# Patient Record
Sex: Male | Born: 1935 | Race: White | Hispanic: No | Marital: Married | Smoking: Former smoker
Health system: Southern US, Community
[De-identification: ages and names within clinical notes are randomized; demographics above are authoritative.]

## PROBLEM LIST (undated history)

## (undated) DIAGNOSIS — I499 Cardiac arrhythmia, unspecified: Secondary | ICD-10-CM

## (undated) DIAGNOSIS — C4491 Basal cell carcinoma of skin, unspecified: Secondary | ICD-10-CM

## (undated) DIAGNOSIS — I209 Angina pectoris, unspecified: Secondary | ICD-10-CM

## (undated) DIAGNOSIS — K469 Unspecified abdominal hernia without obstruction or gangrene: Secondary | ICD-10-CM

## (undated) DIAGNOSIS — C439 Malignant melanoma of skin, unspecified: Secondary | ICD-10-CM

## (undated) DIAGNOSIS — Z87442 Personal history of urinary calculi: Secondary | ICD-10-CM

## (undated) DIAGNOSIS — Z8719 Personal history of other diseases of the digestive system: Secondary | ICD-10-CM

## (undated) DIAGNOSIS — E119 Type 2 diabetes mellitus without complications: Secondary | ICD-10-CM

## (undated) DIAGNOSIS — M1990 Unspecified osteoarthritis, unspecified site: Secondary | ICD-10-CM

## (undated) DIAGNOSIS — R112 Nausea with vomiting, unspecified: Secondary | ICD-10-CM

## (undated) DIAGNOSIS — E079 Disorder of thyroid, unspecified: Secondary | ICD-10-CM

## (undated) DIAGNOSIS — R053 Chronic cough: Secondary | ICD-10-CM

## (undated) DIAGNOSIS — E785 Hyperlipidemia, unspecified: Secondary | ICD-10-CM

## (undated) DIAGNOSIS — K219 Gastro-esophageal reflux disease without esophagitis: Secondary | ICD-10-CM

## (undated) DIAGNOSIS — Z9889 Other specified postprocedural states: Secondary | ICD-10-CM

## (undated) DIAGNOSIS — J310 Chronic rhinitis: Secondary | ICD-10-CM

## (undated) DIAGNOSIS — D649 Anemia, unspecified: Secondary | ICD-10-CM

## (undated) DIAGNOSIS — R42 Dizziness and giddiness: Secondary | ICD-10-CM

## (undated) DIAGNOSIS — I2510 Atherosclerotic heart disease of native coronary artery without angina pectoris: Secondary | ICD-10-CM

## (undated) DIAGNOSIS — R05 Cough: Secondary | ICD-10-CM

## (undated) DIAGNOSIS — F419 Anxiety disorder, unspecified: Secondary | ICD-10-CM

## (undated) DIAGNOSIS — K5730 Diverticulosis of large intestine without perforation or abscess without bleeding: Secondary | ICD-10-CM

## (undated) DIAGNOSIS — R200 Anesthesia of skin: Secondary | ICD-10-CM

## (undated) DIAGNOSIS — Z8739 Personal history of other diseases of the musculoskeletal system and connective tissue: Secondary | ICD-10-CM

## (undated) DIAGNOSIS — A770 Spotted fever due to Rickettsia rickettsii: Secondary | ICD-10-CM

## (undated) DIAGNOSIS — H409 Unspecified glaucoma: Secondary | ICD-10-CM

## (undated) DIAGNOSIS — I10 Essential (primary) hypertension: Secondary | ICD-10-CM

## (undated) DIAGNOSIS — G479 Sleep disorder, unspecified: Secondary | ICD-10-CM

## (undated) DIAGNOSIS — T8859XA Other complications of anesthesia, initial encounter: Secondary | ICD-10-CM

## (undated) DIAGNOSIS — T4145XA Adverse effect of unspecified anesthetic, initial encounter: Secondary | ICD-10-CM

## (undated) HISTORY — DX: Unspecified glaucoma: H40.9

## (undated) HISTORY — PX: OTHER SURGICAL HISTORY: SHX169

## (undated) HISTORY — DX: Malignant melanoma of skin, unspecified: C43.9

## (undated) HISTORY — DX: Type 2 diabetes mellitus without complications: E11.9

## (undated) HISTORY — DX: Atherosclerotic heart disease of native coronary artery without angina pectoris: I25.10

## (undated) HISTORY — PX: TONSILLECTOMY: SUR1361

## (undated) HISTORY — DX: Essential (primary) hypertension: I10

## (undated) HISTORY — DX: Chronic rhinitis: J31.0

## (undated) HISTORY — DX: Hyperlipidemia, unspecified: E78.5

## (undated) HISTORY — DX: Gastro-esophageal reflux disease without esophagitis: K21.9

---

## 1969-08-20 HISTORY — PX: HERNIA REPAIR: SHX51

## 1989-08-06 HISTORY — PX: CORONARY ARTERY BYPASS GRAFT: SHX141

## 2000-03-23 ENCOUNTER — Emergency Department (HOSPITAL_COMMUNITY): Admission: EM | Admit: 2000-03-23 | Discharge: 2000-03-23 | Payer: Self-pay | Admitting: Emergency Medicine

## 2000-03-23 ENCOUNTER — Encounter: Payer: Self-pay | Admitting: Emergency Medicine

## 2004-10-27 ENCOUNTER — Encounter: Admission: RE | Admit: 2004-10-27 | Discharge: 2004-10-27 | Payer: Self-pay | Admitting: Internal Medicine

## 2010-05-04 ENCOUNTER — Inpatient Hospital Stay (HOSPITAL_COMMUNITY): Admission: EM | Admit: 2010-05-04 | Discharge: 2010-05-08 | Payer: Self-pay | Admitting: Emergency Medicine

## 2010-09-24 ENCOUNTER — Observation Stay (HOSPITAL_COMMUNITY): Admission: EM | Admit: 2010-09-24 | Discharge: 2010-09-25 | Payer: Self-pay | Admitting: Emergency Medicine

## 2011-02-17 LAB — BASIC METABOLIC PANEL
BUN: 15 mg/dL (ref 6–23)
Calcium: 9.5 mg/dL (ref 8.4–10.5)
Chloride: 103 mEq/L (ref 96–112)
Creatinine, Ser: 0.74 mg/dL (ref 0.4–1.5)
GFR calc Af Amer: >60 mL/min (ref >60)
GFR calc non Af Amer: >60 mL/min (ref >60)
Glucose, Bld: 127 mg/dL — ABNORMAL HIGH (ref 70–99)
Sodium: 138 mEq/L (ref 135–145)

## 2011-02-17 LAB — PROTIME-INR
INR: 0.99 (ref 0.00–1.49)
Prothrombin Time: 13.3 seconds (ref 11.6–15.2)
Prothrombin Time: 13.4 seconds (ref 11.6–15.2)

## 2011-02-17 LAB — CBC
Hemoglobin: 12.2 g/dL — ABNORMAL LOW (ref 13.0–17.0)
MCH: 31.9 pg (ref 26.0–34.0)
MCHC: 33.7 g/dL (ref 30.0–36.0)
MCV: 93 fL (ref 78.0–100.0)
Platelets: 218 10*3/uL (ref 150–400)
Platelets: 253 10*3/uL (ref 150–400)
RBC: 3.83 MIL/uL — ABNORMAL LOW (ref 4.22–5.81)
RDW: 12.8 % (ref 11.5–15.5)
WBC: 8 10*3/uL (ref 4.0–10.5)

## 2011-02-17 LAB — DIFFERENTIAL
Basophils Absolute: 0 10*3/uL (ref 0.0–0.1)
Eosinophils Absolute: 0.2 10*3/uL (ref 0.0–0.7)
Eosinophils Relative: 2 % (ref 0–5)
Monocytes Relative: 7 % (ref 3–12)
Neutrophils Relative %: 67 % (ref 43–77)

## 2011-02-17 LAB — GLUCOSE, CAPILLARY
Glucose-Capillary: 124 mg/dL — ABNORMAL HIGH (ref 70–99)
Glucose-Capillary: 134 mg/dL — ABNORMAL HIGH (ref 70–99)

## 2011-02-22 LAB — CBC
HCT: 30.1 % — ABNORMAL LOW (ref 39.0–52.0)
HCT: 32.8 % — ABNORMAL LOW (ref 39.0–52.0)
HCT: 34.9 % — ABNORMAL LOW (ref 39.0–52.0)
HCT: 38.3 % — ABNORMAL LOW (ref 39.0–52.0)
Hemoglobin: 12.1 g/dL — ABNORMAL LOW (ref 13.0–17.0)
MCHC: 34.4 g/dL (ref 30.0–36.0)
MCHC: 34.6 g/dL (ref 30.0–36.0)
MCV: 92.7 fL (ref 78.0–100.0)
MCV: 93 fL (ref 78.0–100.0)
MCV: 93.2 fL (ref 78.0–100.0)
Platelets: 149 10*3/uL — ABNORMAL LOW (ref 150–400)
Platelets: 172 10*3/uL (ref 150–400)
Platelets: 166 10*3/uL (ref 150–400)
RBC: 3.76 MIL/uL — ABNORMAL LOW (ref 4.22–5.81)
RDW: 13.8 % (ref 11.5–15.5)
RDW: 14 % (ref 11.5–15.5)
RDW: 13.3 % (ref 11.5–15.5)
WBC: 10.8 10*3/uL — ABNORMAL HIGH (ref 4.0–10.5)
WBC: 7.5 10*3/uL (ref 4.0–10.5)
WBC: 8.8 10*3/uL (ref 4.0–10.5)
WBC: 9.9 10*3/uL (ref 4.0–10.5)

## 2011-02-22 LAB — DIFFERENTIAL
Basophils Absolute: 0 10*3/uL (ref 0.0–0.1)
Basophils Relative: 0 % (ref 0–1)
Eosinophils Absolute: 0 10*3/uL (ref 0.0–0.7)
Eosinophils Relative: 0 % (ref 0–5)
Lymphs Abs: 0.6 10*3/uL — ABNORMAL LOW (ref 0.7–4.0)
Neutrophils Relative %: 86 % — ABNORMAL HIGH (ref 43–77)

## 2011-02-22 LAB — STOOL CULTURE

## 2011-02-22 LAB — COMPREHENSIVE METABOLIC PANEL
AST: 45 U/L — ABNORMAL HIGH (ref 0–37)
BUN: 18 mg/dL (ref 6–23)
CO2: 25 mEq/L (ref 19–32)
Chloride: 92 mEq/L — ABNORMAL LOW (ref 96–112)
Creatinine, Ser: 0.84 mg/dL (ref 0.4–1.5)
GFR calc non Af Amer: >60 mL/min (ref >60)
Glucose, Bld: 128 mg/dL — ABNORMAL HIGH (ref 70–99)
Total Bilirubin: 0.6 mg/dL (ref 0.3–1.2)

## 2011-02-22 LAB — POCT I-STAT, CHEM 8
BUN: 18 mg/dL (ref 6–23)
Calcium, Ion: 1.06 mmol/L — ABNORMAL LOW (ref 1.12–1.32)
HCT: 40 % (ref 39.0–52.0)
Hemoglobin: 13.6 g/dL (ref 13.0–17.0)
Sodium: 126 mEq/L — ABNORMAL LOW (ref 135–145)
TCO2: 26 mmol/L (ref 0–100)

## 2011-02-22 LAB — GLUCOSE, CAPILLARY
Glucose-Capillary: 108 mg/dL — ABNORMAL HIGH (ref 70–99)
Glucose-Capillary: 112 mg/dL — ABNORMAL HIGH (ref 70–99)
Glucose-Capillary: 123 mg/dL — ABNORMAL HIGH (ref 70–99)
Glucose-Capillary: 125 mg/dL — ABNORMAL HIGH (ref 70–99)
Glucose-Capillary: 130 mg/dL — ABNORMAL HIGH (ref 70–99)
Glucose-Capillary: 150 mg/dL — ABNORMAL HIGH (ref 70–99)
Glucose-Capillary: 116 mg/dL — ABNORMAL HIGH (ref 70–99)
Glucose-Capillary: 142 mg/dL — ABNORMAL HIGH (ref 70–99)
Glucose-Capillary: 157 mg/dL — ABNORMAL HIGH (ref 70–99)
Glucose-Capillary: 165 mg/dL — ABNORMAL HIGH (ref 70–99)
Glucose-Capillary: 183 mg/dL — ABNORMAL HIGH (ref 70–99)

## 2011-02-22 LAB — BASIC METABOLIC PANEL
BUN: 10 mg/dL (ref 6–23)
Calcium: 7.8 mg/dL — ABNORMAL LOW (ref 8.4–10.5)
Chloride: 90 mEq/L — ABNORMAL LOW (ref 96–112)
Chloride: 98 mEq/L (ref 96–112)
Chloride: 99 mEq/L (ref 96–112)
Creatinine, Ser: 0.67 mg/dL (ref 0.4–1.5)
Creatinine, Ser: 0.83 mg/dL (ref 0.4–1.5)
GFR calc Af Amer: >60 mL/min (ref >60)
GFR calc non Af Amer: >60 mL/min (ref >60)
Glucose, Bld: 120 mg/dL — ABNORMAL HIGH (ref 70–99)
Potassium: 3.7 mEq/L (ref 3.5–5.1)
Potassium: 4.7 mEq/L (ref 3.5–5.1)
Sodium: 129 mEq/L — ABNORMAL LOW (ref 135–145)

## 2011-02-22 LAB — CLOSTRIDIUM DIFFICILE EIA

## 2011-02-22 LAB — URINALYSIS, ROUTINE W REFLEX MICROSCOPIC
Glucose, UA: NEGATIVE mg/dL
Ketones, ur: >80 mg/dL — AB
Leukocytes, UA: NEGATIVE
Protein, ur: 30 mg/dL — AB
pH: 5.5 (ref 5.0–8.0)

## 2011-02-22 LAB — URINE MICROSCOPIC-ADD ON

## 2011-02-22 LAB — CULTURE, BLOOD (ROUTINE X 2)

## 2011-02-22 LAB — GIARDIA/CRYPTOSPORIDIUM SCREEN(EIA)
Cryptosporidium Screen (EIA): NEGATIVE
Giardia Screen - EIA: NEGATIVE

## 2011-02-22 LAB — POCT CARDIAC MARKERS: Myoglobin, poc: 245 ng/mL (ref 12–200)

## 2011-02-22 LAB — URINE CULTURE: Colony Count: NO GROWTH

## 2011-02-22 LAB — ROCKY MTN SPOTTED FVR AB, IGG-BLOOD: RMSF IgG: 0.08 IV

## 2011-04-26 ENCOUNTER — Inpatient Hospital Stay (INDEPENDENT_AMBULATORY_CARE_PROVIDER_SITE_OTHER)
Admission: RE | Admit: 2011-04-26 | Discharge: 2011-04-26 | Disposition: A | Discharge status: Home / Self Care | Payer: Self-pay | Source: Ambulatory Visit | Attending: Family Medicine | Admitting: Family Medicine

## 2011-04-26 ENCOUNTER — Ambulatory Visit (INDEPENDENT_AMBULATORY_CARE_PROVIDER_SITE_OTHER): Payer: Self-pay

## 2011-04-26 DIAGNOSIS — S139XXA Sprain of joints and ligaments of unspecified parts of neck, initial encounter: Secondary | ICD-10-CM

## 2011-04-26 DIAGNOSIS — G542 Cervical root disorders, not elsewhere classified: Secondary | ICD-10-CM

## 2011-06-03 ENCOUNTER — Inpatient Hospital Stay (HOSPITAL_BASED_OUTPATIENT_CLINIC_OR_DEPARTMENT_OTHER)
Admission: RE | Admit: 2011-06-03 | Discharge: 2011-06-03 | Disposition: A | Discharge status: Hospice | Payer: Medicare Other | Source: Ambulatory Visit | Attending: Interventional Cardiology | Admitting: Interventional Cardiology

## 2011-06-03 DIAGNOSIS — R0789 Other chest pain: Secondary | ICD-10-CM | POA: Insufficient documentation

## 2011-06-03 DIAGNOSIS — I2582 Chronic total occlusion of coronary artery: Secondary | ICD-10-CM | POA: Insufficient documentation

## 2011-06-03 DIAGNOSIS — R5381 Other malaise: Secondary | ICD-10-CM | POA: Insufficient documentation

## 2011-06-03 DIAGNOSIS — I2510 Atherosclerotic heart disease of native coronary artery without angina pectoris: Secondary | ICD-10-CM | POA: Insufficient documentation

## 2011-06-03 DIAGNOSIS — R0609 Other forms of dyspnea: Secondary | ICD-10-CM | POA: Insufficient documentation

## 2011-06-03 DIAGNOSIS — Z951 Presence of aortocoronary bypass graft: Secondary | ICD-10-CM | POA: Insufficient documentation

## 2011-06-03 DIAGNOSIS — R0989 Other specified symptoms and signs involving the circulatory and respiratory systems: Secondary | ICD-10-CM | POA: Insufficient documentation

## 2011-06-10 NOTE — Cardiovascular Report (Signed)
NAME:  Kenneth [name], Kenneth [name] NO.:  [id number]  MEDICAL RECORD NO.:  [id number]  LOCATION:  URG                          FACILITY:  [facility]  PHYSICIAN:  Lyn Records, M.D.   DATE OF BIRTH:  06-Jan-1928  DATE OF PROCEDURE:  06/03/2011 DATE OF DISCHARGE:  04/26/2011                           CARDIAC CATHETERIZATION   INDICATION FOR THE STUDY:  The patient gives a 2-6 months history of increasing exertional fatigue and dyspnea.  There is also associated chest tightness.  He has a history of coronary artery bypass grafting in 1993.  He is undergoing coronary angiography after a stress nuclear study demonstrated high risk characteristics including ischemic LV dilatation, and basal to mid anterior wall moderate-to-severe ischemia. He has preserved LVEF.  PROCEDURE PERFORMED: 1. Left heart catheterization via the right femoral approach using 4-     [place] equipment. 2. Selective native coronary angiography. 3. Saphenous vein graft angiography. 4. Left internal mammary graft angiography. 5. Left ventriculography by hand injection.  DESCRIPTION:  After informed consent was documented in the patient's records, left heart catheterization was performed via the right femoral approach using a modified Seldinger technique to insert a 4-French sheath.  Xylocaine 1% was used for local anesthesia.  A 4-French A2 multipurpose catheter was used for saphenous vein graft angiography, as well as native right coronary angiography, and left ventriculography by hand injection.  Hemodynamic data was recorded with the catheter.  We used a 4-French #4 left Judkins catheter for left coronary angiography.  This catheter was also used to administer 200 mcg of intracoronary nitroglycerin into the left main.  The native right coronary artery was engaged using the multipurpose catheter.  A 4-French #4 internal mammary catheter was used for left internal mammary artery angiography.  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  We required a steerable 0.035 Glidewire to enter the subclavian.  After that manipulation, we easily engaged the left internal mammary.  The patient tolerated the procedure without complications.  Hemostasis was achieved with manual compression.  RESULTS: 1. Hemodynamic data:     a.     Aortic pressure 183/65 mmHg.     b.     Left ventricular pressure 185/26 mmHg.         I. Left ventriculography:  Distal inferoapical mild to             moderate hypokinesis with preserved overall LV function and             estimated ejection fraction of 60%.         II.Coronary angiography:  Heavy diffuse coronary calcification             is noted above the left and right coronary with             fluoroscopy.     c.     Left main coronary:  Totally occluded before the origin of      the circumflex and left anterior descending.     d.     Left anterior descending coronary:  Totally  occluded.     e.     Circumflex artery:  Totally occluded.     f.     Right coronary:  Proximal occlusion after origin of the      conus branch.         I. Bypass graft angiography.     g.     Saphenous vein graft to the obtuse marginal:  There is      proximal 30% narrowing in the saphenous vein graft.  The graft      then anastomoses to the mid dominant obtuse marginal and is widely      patent.  The native obtuse marginal contains heavy calcification      but no high-grade obstruction.  Collaterals to either ramus      intermedius or diagonal branch is noted.     h.     Saphenous vein graft to the right coronary:  The right      coronary is a dominant vessel.         I. Left ventricular branches arise distally and supplies             collaterals to the anterolateral wall and perhaps some             obtuse marginals. A Large PDA arises distally.  There  is             moderate disease in the native right coronary proximal to             the graft insertion site into the PDA.  These regions are             heavily calcified and they were present above the calcific             deposits within the distal right coronary.  They are             borderline significant.  The PDA supplies collaterals to             the proximal and mid anterior septum via perforators.  The             LAD distal to the anastomoses is diffusely diseased,             heavily calcified, and had stenoses up to 60-70%.  No focal             high-grade stenosis was noted.  CONCLUSIONS: 1. Severe native vessel coronary artery disease with total occlusion     of the distal left main and the proximal RCA. 2. Patent saphenous vein and left internal mammary artery graft as     noted above. 3. Abnormal nuclear study with basal and mid anterior wall ischemia     due to occlusion of the ostial LAD preventing antegrade flow and     occlusion of the mid-LAD at the anastomoses with the internal     mammary leaving the proximal and mid anterior wall depending upon     poorly formed collaterals from the right coronary territory and the     obtuse marginal territory.  The patient also has diffuse disease in     the LAD distal to the LIMA graft insertion, however, there is no     evidence of ischemia in the distal and apical LAD territory. 4. Preserved overall LV function with inferior wall motion     abnormalities.  PLAN: 1. Medication adjustments will include addition of long-acting  nitrates to hopefully help improve collateral blood flow to the     proximal and mid anterior wall.  Aggressive systolic blood pressure     control.  There is no reasonable treatment option with reference to     percutaneous angioplasty given the patient's anatomy.  Despite the     high risk appearance of the patient's nuclear study, the anatomy     would suggest a relatively stable clinical  situation, although time     and perhaps medication adjustment may help to further decrease the     patient's risk by improving collateral flow to the anterior wall.     Lyn Records, M.D.     HWS/MEDQ  D:  06/03/2011  T:  06/04/2011  Job:  [id number]  cc:   Thora Lance, M.D.  Electronically Signed by Verdis Prime M.D. on 06/10/2011 01:34:29 PM

## 2013-07-10 ENCOUNTER — Other Ambulatory Visit: Payer: Self-pay | Admitting: Internal Medicine

## 2013-07-10 DIAGNOSIS — I639 Cerebral infarction, unspecified: Secondary | ICD-10-CM

## 2013-07-10 DIAGNOSIS — R42 Dizziness and giddiness: Secondary | ICD-10-CM

## 2013-07-10 DIAGNOSIS — R2689 Other abnormalities of gait and mobility: Secondary | ICD-10-CM

## 2013-07-11 ENCOUNTER — Ambulatory Visit
Admission: RE | Admit: 2013-07-11 | Discharge: 2013-07-11 | Disposition: A | Discharge status: Home / Self Care | Payer: Medicare Other | Source: Ambulatory Visit | Attending: Internal Medicine | Admitting: Internal Medicine

## 2013-07-11 DIAGNOSIS — R2689 Other abnormalities of gait and mobility: Secondary | ICD-10-CM

## 2013-07-11 DIAGNOSIS — R42 Dizziness and giddiness: Secondary | ICD-10-CM

## 2013-07-11 DIAGNOSIS — I639 Cerebral infarction, unspecified: Secondary | ICD-10-CM

## 2013-07-17 ENCOUNTER — Other Ambulatory Visit: Payer: Self-pay | Admitting: Internal Medicine

## 2013-07-17 DIAGNOSIS — R0981 Nasal congestion: Secondary | ICD-10-CM

## 2013-07-18 ENCOUNTER — Ambulatory Visit
Admission: RE | Admit: 2013-07-18 | Discharge: 2013-07-18 | Disposition: A | Discharge status: Home / Self Care | Payer: Medicare Other | Source: Ambulatory Visit | Attending: Internal Medicine | Admitting: Internal Medicine

## 2013-07-18 DIAGNOSIS — R0981 Nasal congestion: Secondary | ICD-10-CM

## 2013-07-18 MED ORDER — IOHEXOL 300 MG/ML  SOLN
75.0000 mL | Freq: Once | INTRAMUSCULAR | Status: AC | PRN
  Administered 2013-07-18: 75 mL via INTRAVENOUS

## 2013-09-12 ENCOUNTER — Ambulatory Visit: Payer: Medicare Other | Admitting: Interventional Cardiology

## 2013-09-27 ENCOUNTER — Encounter: Payer: Self-pay | Admitting: *Deleted

## 2013-09-27 ENCOUNTER — Encounter: Payer: Self-pay | Admitting: Interventional Cardiology

## 2013-09-27 DIAGNOSIS — I10 Essential (primary) hypertension: Secondary | ICD-10-CM | POA: Insufficient documentation

## 2013-09-27 DIAGNOSIS — H409 Unspecified glaucoma: Secondary | ICD-10-CM | POA: Insufficient documentation

## 2013-09-27 DIAGNOSIS — N200 Calculus of kidney: Secondary | ICD-10-CM | POA: Insufficient documentation

## 2013-09-27 DIAGNOSIS — C439 Malignant melanoma of skin, unspecified: Secondary | ICD-10-CM | POA: Insufficient documentation

## 2013-09-27 DIAGNOSIS — E785 Hyperlipidemia, unspecified: Secondary | ICD-10-CM | POA: Insufficient documentation

## 2013-09-27 DIAGNOSIS — I25709 Atherosclerosis of coronary artery bypass graft(s), unspecified, with unspecified angina pectoris: Secondary | ICD-10-CM | POA: Insufficient documentation

## 2013-09-27 DIAGNOSIS — K219 Gastro-esophageal reflux disease without esophagitis: Secondary | ICD-10-CM | POA: Insufficient documentation

## 2013-09-28 ENCOUNTER — Encounter (INDEPENDENT_AMBULATORY_CARE_PROVIDER_SITE_OTHER): Payer: Self-pay

## 2013-09-28 ENCOUNTER — Ambulatory Visit (INDEPENDENT_AMBULATORY_CARE_PROVIDER_SITE_OTHER): Payer: Medicare Other | Admitting: Interventional Cardiology

## 2013-09-28 ENCOUNTER — Encounter: Payer: Self-pay | Admitting: Interventional Cardiology

## 2013-09-28 VITALS — BP 164/90 | HR 72 | Ht 68.0 in | Wt 163.8 lb

## 2013-09-28 DIAGNOSIS — I10 Essential (primary) hypertension: Secondary | ICD-10-CM

## 2013-09-28 DIAGNOSIS — R634 Abnormal weight loss: Secondary | ICD-10-CM

## 2013-09-28 DIAGNOSIS — I2510 Atherosclerotic heart disease of native coronary artery without angina pectoris: Secondary | ICD-10-CM

## 2013-09-28 DIAGNOSIS — E785 Hyperlipidemia, unspecified: Secondary | ICD-10-CM

## 2013-09-28 DIAGNOSIS — R1310 Dysphagia, unspecified: Secondary | ICD-10-CM

## 2013-09-28 NOTE — Progress Notes (Signed)
Patient ID: Kenneth [name], male   DOB: 1928/10/18, 79 y.o.   MRN: [id number]    [address], [address] [place], [place]  [address] Phone: [phone] Fax:  [phone]  Date:  09/28/2013   ID:  Kenneth [name], DOB Oct 14, 1928, MRN [id number]  PCP:  Provider Not In System   ASSESSMENT:  1. CAD stable  2. Hypertension  3. Anorexia and weight loss  4. Early satiety  5.Dysphagia.  PLAN:  1. Needs to see primary care about weight loss/early satiety/dysphagia  2. No specific cardiac evaluation is needed.  3. Broad a barium swallow with upper GI series. We need to exclude the possibility of esophageal or gastric cancer   SUBJECTIVE: Kenneth [name] is a 79 y.o. male who is having left side pain and poor appetite. He has lost weight, 30 pounds since October 2013.[name] No cardiac complaints. He does get angina if he walks 300 yards.. Having dysphagia with solids. He develops early satiety. He has stable angina. He denies orthopnea. There is no peripheral edema.   Wt Readings from Last 3 Encounters:  09/28/13 163 lb 12.8 oz (74.299 kg)     Past Medical History  Diagnosis Date  . Diabetes   . HTN (hypertension)   . Coronary atherosclerosis of unspecified type of vessel, native or graft     CABG 1993, LIMA to LAD, SVG to obtuse marginal one, SVG to posterior descending, stable angina  . Hyperlipidemia `  . GERD (gastroesophageal reflux disease)   . Nephrolithiasis   . Chronic rhinitis   . Melanoma of skin, site unspecified   . Glaucoma     Current Outpatient Prescriptions  Medication Sig Dispense Refill  . acetaminophen (TYLENOL) 325 MG tablet Take 650 mg by mouth every 6 (six) hours as needed for pain.      [name] amLODipine (NORVASC) 10 MG tablet Take 10 mg by mouth daily.      . Cyanocobalamin (VITAMIN B-12 PO) Take 1 tablet by mouth daily.      . ferrous fumarate (HEMOCYTE - 106 MG FE) 325 (106 FE) MG TABS tablet Take 1 tablet by mouth. 65 MG      .  hydrochlorothiazide (HYDRODIURIL) 25 MG tablet Take 12.5 mg by mouth 2 (two) times daily.       . isosorbide mononitrate (IMDUR) 60 MG 24 hr tablet Take 60 mg by mouth daily.      . metFORMIN (GLUCOPHAGE) 1000 MG tablet Take 1,000 mg by mouth as directed.      . metoprolol succinate (TOPROL-XL) 50 MG 24 hr tablet Take 50 mg by mouth daily. Take with or immediately following a meal.      . Multiple Vitamins-Minerals (CENTRAL-VITE PO) Take by mouth daily.      . nitroGLYCERIN (NITROSTAT) 0.4 MG SL tablet Place 0.4 mg under the tongue every 5 (five) minutes as needed for chest pain.      . pantoprazole (PROTONIX) 40 MG tablet Take 40 mg by mouth daily.      . potassium chloride (K-DUR,KLOR-CON) 10 MEQ tablet Take 10 mEq by mouth 2 (two) times daily.      . simvastatin (ZOCOR) 20 MG tablet Take 20 mg by mouth every evening.       No current facility-administered medications for this visit.    Allergies:    Allergies  Allergen Reactions  . Lipitor [Atorvastatin]   . Lopressor [Metoprolol Tartrate]   . Penicillins     Social History:  The patient  reports that he has been smoking Cigars.  He does not have any smokeless tobacco history on file. He reports that he does not drink alcohol or use illicit drugs.   ROS:  Please see the history of present illness.   Appears weak. No orthopnea or PND. Denies melena. Has left lower quadrant discomfort. Also has left flank discomfort.   All other systems reviewed and negative.   OBJECTIVE: VS:  BP 164/90  Pulse 72  Ht 5\' 8"  (1.727 m)  Wt 163 lb 12.8 oz (74.299 kg)  BMI 24.91 kg/m2 Well nourished, well developed, in no acute distress, frail HEENT: normal Neck: JVD flat. Carotid bruit absent  Cardiac:  normal S1, S2; RRR; no murmur Lungs:  clear to auscultation bilaterally, no wheezing, rhonchi or rales Abd: soft, nontender, no hepatomegaly Ext: Edema trace. Pulses present Skin: warm and dryAnd pale Neuro:  CNs 2-12 intact, no focal abnormalities  noted  EKG:  Normal sinus rhythm with prominent voltage. Left atrial abnormality. Incomplete right bundle branch block. No change from baseline       Signed, Darci Needle III, MD 09/28/2013 11:30 AM

## 2013-09-28 NOTE — Patient Instructions (Signed)
Your physician recommends that you continue on your current medications as directed. Please refer to the Current Medication list given to you today.  Your physician wants you to follow-up in: 1 year You will receive a reminder letter in the mail two months in advance. If you don't receive a letter, please call our office to schedule the follow-up appointment.  Keep you appointment with your pcp  Dr.Smith has referred you to [facility] to have an upper GI series they will contact with an appointment date and time

## 2013-10-16 ENCOUNTER — Other Ambulatory Visit: Payer: Self-pay | Admitting: Gastroenterology

## 2013-11-08 ENCOUNTER — Encounter (HOSPITAL_COMMUNITY): Payer: Self-pay | Admitting: *Deleted

## 2013-11-09 ENCOUNTER — Encounter (HOSPITAL_COMMUNITY): Payer: Self-pay | Admitting: Pharmacy Technician

## 2013-11-27 ENCOUNTER — Encounter (HOSPITAL_COMMUNITY): Payer: Self-pay | Admitting: Certified Registered Nurse Anesthetist

## 2013-11-27 ENCOUNTER — Encounter (HOSPITAL_COMMUNITY): Payer: Medicare Other | Admitting: Anesthesiology

## 2013-11-27 ENCOUNTER — Ambulatory Visit (HOSPITAL_COMMUNITY)
Admission: RE | Admit: 2013-11-27 | Discharge: 2013-11-27 | Disposition: A | Discharge status: Home / Self Care | Payer: Medicare Other | Source: Ambulatory Visit | Attending: Gastroenterology | Admitting: Gastroenterology

## 2013-11-27 ENCOUNTER — Encounter (HOSPITAL_COMMUNITY)
Admission: RE | Disposition: A | Discharge status: Home / Self Care | Payer: Self-pay | Source: Ambulatory Visit | Attending: Gastroenterology

## 2013-11-27 ENCOUNTER — Ambulatory Visit (HOSPITAL_COMMUNITY): Payer: Medicare Other | Admitting: Anesthesiology

## 2013-11-27 DIAGNOSIS — D126 Benign neoplasm of colon, unspecified: Secondary | ICD-10-CM | POA: Insufficient documentation

## 2013-11-27 DIAGNOSIS — K5730 Diverticulosis of large intestine without perforation or abscess without bleeding: Secondary | ICD-10-CM | POA: Insufficient documentation

## 2013-11-27 DIAGNOSIS — K5289 Other specified noninfective gastroenteritis and colitis: Secondary | ICD-10-CM | POA: Insufficient documentation

## 2013-11-27 DIAGNOSIS — E119 Type 2 diabetes mellitus without complications: Secondary | ICD-10-CM | POA: Insufficient documentation

## 2013-11-27 DIAGNOSIS — E7800 Pure hypercholesterolemia, unspecified: Secondary | ICD-10-CM | POA: Insufficient documentation

## 2013-11-27 DIAGNOSIS — F17200 Nicotine dependence, unspecified, uncomplicated: Secondary | ICD-10-CM | POA: Insufficient documentation

## 2013-11-27 DIAGNOSIS — I2510 Atherosclerotic heart disease of native coronary artery without angina pectoris: Secondary | ICD-10-CM | POA: Insufficient documentation

## 2013-11-27 DIAGNOSIS — K219 Gastro-esophageal reflux disease without esophagitis: Secondary | ICD-10-CM | POA: Insufficient documentation

## 2013-11-27 DIAGNOSIS — K922 Gastrointestinal hemorrhage, unspecified: Secondary | ICD-10-CM | POA: Insufficient documentation

## 2013-11-27 DIAGNOSIS — I10 Essential (primary) hypertension: Secondary | ICD-10-CM | POA: Insufficient documentation

## 2013-11-27 HISTORY — DX: Anemia, unspecified: D64.9

## 2013-11-27 HISTORY — DX: Other specified postprocedural states: Z98.890

## 2013-11-27 HISTORY — DX: Spotted fever due to Rickettsia rickettsii: A77.0

## 2013-11-27 HISTORY — PX: COLONOSCOPY WITH PROPOFOL: SHX5780

## 2013-11-27 HISTORY — DX: Other complications of anesthesia, initial encounter: T88.59XA

## 2013-11-27 HISTORY — DX: Nausea with vomiting, unspecified: R11.2

## 2013-11-27 HISTORY — DX: Adverse effect of unspecified anesthetic, initial encounter: T41.45XA

## 2013-11-27 HISTORY — DX: Unspecified abdominal hernia without obstruction or gangrene: K46.9

## 2013-11-27 LAB — GLUCOSE, CAPILLARY: Glucose-Capillary: 128 mg/dL — ABNORMAL HIGH (ref 70–99)

## 2013-11-27 SURGERY — COLONOSCOPY WITH PROPOFOL
Anesthesia: Monitor Anesthesia Care

## 2013-11-27 MED ORDER — KETAMINE HCL 10 MG/ML IJ SOLN
INTRAMUSCULAR | Status: DC | PRN
  Administered 2013-11-27 (×2): 10 mg via INTRAVENOUS

## 2013-11-27 MED ORDER — ONDANSETRON HCL 4 MG/2ML IJ SOLN
INTRAMUSCULAR | Status: DC | PRN
  Administered 2013-11-27: 4 mg via INTRAVENOUS

## 2013-11-27 MED ORDER — MIDAZOLAM HCL 5 MG/5ML IJ SOLN
INTRAMUSCULAR | Status: DC | PRN
  Administered 2013-11-27 (×2): 1 mg via INTRAVENOUS

## 2013-11-27 MED ORDER — PROPOFOL 10 MG/ML IV BOLUS
INTRAVENOUS | Status: AC
  Filled 2013-11-27: qty 20

## 2013-11-27 MED ORDER — GLYCOPYRROLATE 0.2 MG/ML IJ SOLN
INTRAMUSCULAR | Status: DC | PRN
  Administered 2013-11-27: 0.2 mg via INTRAVENOUS

## 2013-11-27 MED ORDER — PROPOFOL INFUSION 10 MG/ML OPTIME
INTRAVENOUS | Status: DC | PRN
  Administered 2013-11-27: 120 ug/kg/min via INTRAVENOUS

## 2013-11-27 MED ORDER — MIDAZOLAM HCL 2 MG/2ML IJ SOLN
INTRAMUSCULAR | Status: AC
  Filled 2013-11-27: qty 2

## 2013-11-27 MED ORDER — LIDOCAINE HCL (CARDIAC) 20 MG/ML IV SOLN
INTRAVENOUS | Status: DC | PRN
  Administered 2013-11-27: 100 mg via INTRAVENOUS

## 2013-11-27 MED ORDER — LACTATED RINGERS IV SOLN
INTRAVENOUS | Status: DC | PRN
  Administered 2013-11-27: 09:00:00 via INTRAVENOUS

## 2013-11-27 MED ORDER — LIDOCAINE HCL (CARDIAC) 20 MG/ML IV SOLN
INTRAVENOUS | Status: AC
  Filled 2013-11-27: qty 5

## 2013-11-27 MED ORDER — ONDANSETRON HCL 4 MG/2ML IJ SOLN
INTRAMUSCULAR | Status: AC
  Filled 2013-11-27: qty 2

## 2013-11-27 MED ORDER — GLYCOPYRROLATE 0.2 MG/ML IJ SOLN
INTRAMUSCULAR | Status: AC
  Filled 2013-11-27: qty 1

## 2013-11-27 SURGICAL SUPPLY — 22 items

## 2013-11-27 NOTE — Anesthesia Postprocedure Evaluation (Signed)
Anesthesia Post Note  Patient: Kenneth [name]  Procedure(s) Performed: Procedure(s) (LRB): COLONOSCOPY WITH PROPOFOL (N/A)  Anesthesia type: MAC  Patient location: PACU  Post pain: Pain level controlled  Post assessment: Post-op Vital signs reviewed  Last Vitals:  Filed Vitals:   11/27/13 1002  BP:   Pulse:   Temp: 36.4 C  Resp:     Post vital signs: Reviewed  Level of consciousness: sedated  Complications: No apparent anesthesia complications

## 2013-11-27 NOTE — Transfer of Care (Signed)
Immediate Anesthesia Transfer of Care Note  Patient: Kenneth [name]  Procedure(s) Performed: Procedure(s) (LRB): COLONOSCOPY WITH PROPOFOL (N/A)  Patient Location: PACU  Anesthesia Type: MAC  Level of Consciousness: sedated, patient cooperative and responds to stimulation  Airway & Oxygen Therapy: Patient Spontanous Breathing and Patient connected to face mask oxgen  Post-op Assessment: Report given to PACU RN and Post -op Vital signs reviewed and stable  Post vital signs: Reviewed and stable  Complications: No apparent anesthesia complications

## 2013-11-27 NOTE — Anesthesia Preprocedure Evaluation (Addendum)
Anesthesia Evaluation  Patient identified by MRN, date of birth, ID band Patient awake    Reviewed: Allergy & Precautions, H&P , NPO status , Patient's Chart, lab work & pertinent test results  History of Anesthesia Complications (+) PONV  Airway Mallampati: II TM Distance: >3 FB Neck ROM: Full    Dental  (+) Caps, Dental Advisory Given and Missing   Pulmonary Current Smoker,  breath sounds clear to auscultation  Pulmonary exam normal       Cardiovascular hypertension, Pt. on medications + CAD and + CABG Rhythm:Regular Rate:Normal     Neuro/Psych negative neurological ROS  negative psych ROS   GI/Hepatic Neg liver ROS, GERD-  ,  Endo/Other  diabetes, Type 2, Oral Hypoglycemic Agents  Renal/GU Renal disease  negative genitourinary   Musculoskeletal negative musculoskeletal ROS (+)   Abdominal   Peds  Hematology negative hematology ROS (+) anemia ,   Anesthesia Other Findings   Reproductive/Obstetrics                         Anesthesia Physical Anesthesia Plan  ASA: III  Anesthesia Plan: MAC   Post-op Pain Management:    Induction: Intravenous  Airway Management Planned: Simple Face Mask  Additional Equipment:   Intra-op Plan:   Post-operative Plan:   Informed Consent: I have reviewed the patients History and Physical, chart, labs and discussed the procedure including the risks, benefits and alternatives for the proposed anesthesia with the patient or authorized representative who has indicated his/her understanding and acceptance.   Dental advisory given  Plan Discussed with: CRNA  Anesthesia Plan Comments:         Anesthesia Quick Evaluation

## 2013-11-27 NOTE — Op Note (Signed)
Problem: Resolved hematochezia  Endoscopist: Danise Edge  Premedication: Propofol administered by anesthesia  Procedure: Diagnostic colonoscopy The patient was placed in the left lateral decubitus position. Anal and digital rectal exam were normal. The Pentax pediatric colonoscope was introduced into the rectum and advanced to the cecum. A normal-appearing ileocecal valve and appendiceal orifice were identified. Colonic preparation for the exam today was good.  Rectum. Normal. Retroflexed view of the distal rectum normal.  Sigmoid colon and descending colon. Extensive left colonic diverticulosis. Mild generalized sigmoid colitis most consistent with resolving ischemic colitis. From the mid sigmoid colon, a 5 mm sessile polyp was removed with the cold snare.  Splenic flexure. Normal  Transverse colon. Extensive colonic diverticulosis  Splenic flexure. Normal  Ascending colon. From the mid ascending colon a 1 cm sessile polyp was removed with the hot and cold snare in piecemeal fashion. Extensive colonic diverticulosis.  Cecum and ileocecal valve. Colonic diverticulosis  Assessment:  #1. Universal colonic diverticulosis  #2. Resolving sigmoid colitis associated with colonic diverticulosis most consistent with ischemic colitis  #3. A 5 mm sessile polyp was removed from the mid sigmoid colon  #4. A 1 cm sessile polyp was removed from the mid ascending colon.

## 2013-11-27 NOTE — H&P (Signed)
Problem: Resolved hematochezia  History: The patient is an 79 year old male born 1928-09-20. The patient underwent a normal screening colonoscopy on 12/08/2004., While taking aspirin, the patient developed hematochezia which resolved when he stopped taking aspirin.  The patient is scheduled to undergo a diagnostic colonoscopy to evaluate lower gastrointestinal bleeding  Past medical history: Coronary artery disease. Coronary artery bypass grafting. Hypertension. 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 Hypercholesterolemia. Gastroesophageal reflux. Kidney stones. Vitamin B12 deficiency. Melanoma removal from the left forearm in 2008. Glaucoma. Left inguinal hernia repair.  Medication allergies: Lipitor. Lopressor. Penicillin. Lotrel. Azor.  Exam: The patient is alert and lying comfortably on the endoscopy stretcher. Lungs are clear to auscultation. Cardiac exam reveals a regular rhythm. Abdomen is soft and nontender to palpation.  Plan: Proceed with diagnostic colonoscopy to evaluate lower gastrointestinal bleeding

## 2013-11-28 ENCOUNTER — Encounter (HOSPITAL_COMMUNITY): Payer: Self-pay | Admitting: Gastroenterology

## 2013-12-01 ENCOUNTER — Other Ambulatory Visit: Payer: Self-pay | Admitting: Interventional Cardiology

## 2014-06-26 ENCOUNTER — Emergency Department (HOSPITAL_COMMUNITY): Payer: Medicare Other

## 2014-06-26 ENCOUNTER — Inpatient Hospital Stay (HOSPITAL_COMMUNITY)
Admission: EM | Admit: 2014-06-26 | Discharge: 2014-06-30 | DRG: 352 | Disposition: A | Discharge status: Home / Self Care | Payer: Medicare Other | Attending: General Surgery | Admitting: General Surgery

## 2014-06-26 ENCOUNTER — Encounter (HOSPITAL_COMMUNITY): Payer: Self-pay | Admitting: Emergency Medicine

## 2014-06-26 DIAGNOSIS — Z8249 Family history of ischemic heart disease and other diseases of the circulatory system: Secondary | ICD-10-CM

## 2014-06-26 DIAGNOSIS — Z886 Allergy status to analgesic agent status: Secondary | ICD-10-CM

## 2014-06-26 DIAGNOSIS — I209 Angina pectoris, unspecified: Secondary | ICD-10-CM | POA: Diagnosis present

## 2014-06-26 DIAGNOSIS — Z951 Presence of aortocoronary bypass graft: Secondary | ICD-10-CM

## 2014-06-26 DIAGNOSIS — Z87442 Personal history of urinary calculi: Secondary | ICD-10-CM

## 2014-06-26 DIAGNOSIS — K219 Gastro-esophageal reflux disease without esophagitis: Secondary | ICD-10-CM | POA: Diagnosis present

## 2014-06-26 DIAGNOSIS — E785 Hyperlipidemia, unspecified: Secondary | ICD-10-CM | POA: Diagnosis present

## 2014-06-26 DIAGNOSIS — E119 Type 2 diabetes mellitus without complications: Secondary | ICD-10-CM | POA: Diagnosis present

## 2014-06-26 DIAGNOSIS — K439 Ventral hernia without obstruction or gangrene: Secondary | ICD-10-CM

## 2014-06-26 DIAGNOSIS — K4091 Unilateral inguinal hernia, without obstruction or gangrene, recurrent: Principal | ICD-10-CM | POA: Diagnosis present

## 2014-06-26 DIAGNOSIS — Z888 Allergy status to other drugs, medicaments and biological substances status: Secondary | ICD-10-CM

## 2014-06-26 DIAGNOSIS — K4090 Unilateral inguinal hernia, without obstruction or gangrene, not specified as recurrent: Secondary | ICD-10-CM | POA: Diagnosis present

## 2014-06-26 DIAGNOSIS — R350 Frequency of micturition: Secondary | ICD-10-CM | POA: Diagnosis present

## 2014-06-26 DIAGNOSIS — I10 Essential (primary) hypertension: Secondary | ICD-10-CM | POA: Diagnosis present

## 2014-06-26 DIAGNOSIS — Z01810 Encounter for preprocedural cardiovascular examination: Secondary | ICD-10-CM

## 2014-06-26 DIAGNOSIS — I498 Other specified cardiac arrhythmias: Secondary | ICD-10-CM | POA: Diagnosis not present

## 2014-06-26 DIAGNOSIS — H409 Unspecified glaucoma: Secondary | ICD-10-CM | POA: Diagnosis present

## 2014-06-26 DIAGNOSIS — R61 Generalized hyperhidrosis: Secondary | ICD-10-CM | POA: Diagnosis not present

## 2014-06-26 DIAGNOSIS — F17200 Nicotine dependence, unspecified, uncomplicated: Secondary | ICD-10-CM | POA: Diagnosis present

## 2014-06-26 DIAGNOSIS — R1032 Left lower quadrant pain: Secondary | ICD-10-CM | POA: Diagnosis not present

## 2014-06-26 DIAGNOSIS — I25709 Atherosclerosis of coronary artery bypass graft(s), unspecified, with unspecified angina pectoris: Secondary | ICD-10-CM | POA: Diagnosis present

## 2014-06-26 DIAGNOSIS — E7800 Pure hypercholesterolemia, unspecified: Secondary | ICD-10-CM | POA: Diagnosis present

## 2014-06-26 DIAGNOSIS — Z880 Allergy status to penicillin: Secondary | ICD-10-CM

## 2014-06-26 DIAGNOSIS — I2510 Atherosclerotic heart disease of native coronary artery without angina pectoris: Secondary | ICD-10-CM

## 2014-06-26 DIAGNOSIS — Z79899 Other long term (current) drug therapy: Secondary | ICD-10-CM

## 2014-06-26 DIAGNOSIS — E099 Drug or chemical induced diabetes mellitus without complications: Secondary | ICD-10-CM

## 2014-06-26 DIAGNOSIS — J310 Chronic rhinitis: Secondary | ICD-10-CM | POA: Diagnosis present

## 2014-06-26 DIAGNOSIS — Z85820 Personal history of malignant melanoma of skin: Secondary | ICD-10-CM

## 2014-06-26 DIAGNOSIS — I25810 Atherosclerosis of coronary artery bypass graft(s) without angina pectoris: Secondary | ICD-10-CM | POA: Diagnosis present

## 2014-06-26 HISTORY — DX: Diverticulosis of large intestine without perforation or abscess without bleeding: K57.30

## 2014-06-26 HISTORY — DX: Atherosclerotic heart disease of native coronary artery without angina pectoris: I25.10

## 2014-06-26 LAB — CBC WITH DIFFERENTIAL/PLATELET
Basophils Absolute: 0 10*3/uL (ref 0.0–0.1)
Basophils Relative: 0 % (ref 0–1)
Eosinophils Absolute: 0.2 10*3/uL (ref 0.0–0.7)
Eosinophils Relative: 2 % (ref 0–5)
HCT: 35 % — ABNORMAL LOW (ref 39.0–52.0)
Hemoglobin: 11.4 g/dL — ABNORMAL LOW (ref 13.0–17.0)
Lymphocytes Relative: 16 % (ref 12–46)
Lymphs Abs: 1.7 10*3/uL (ref 0.7–4.0)
MCH: 30.4 pg (ref 26.0–34.0)
MCHC: 32.6 g/dL (ref 30.0–36.0)
MCV: 93.3 fL (ref 78.0–100.0)
Monocytes Absolute: 0.7 10*3/uL (ref 0.1–1.0)
Monocytes Relative: 7 % (ref 3–12)
Neutro Abs: 7.7 10*3/uL (ref 1.7–7.7)
Neutrophils Relative %: 75 % (ref 43–77)
Platelets: 370 10*3/uL (ref 150–400)
RBC: 3.75 MIL/uL — ABNORMAL LOW (ref 4.22–5.81)
RDW: 14.3 % (ref 11.5–15.5)
WBC: 10.3 10*3/uL (ref 4.0–10.5)

## 2014-06-26 LAB — COMPREHENSIVE METABOLIC PANEL
ALBUMIN: 3.7 g/dL (ref 3.5–5.2)
ALT: 12 U/L (ref 0–53)
ANION GAP: 11 (ref 5–15)
AST: 17 U/L (ref 0–37)
Alkaline Phosphatase: 76 U/L (ref 39–117)
BILIRUBIN TOTAL: 0.4 mg/dL (ref 0.3–1.2)
BUN: 20 mg/dL (ref 6–23)
CO2: 27 mEq/L (ref 19–32)
CREATININE: 0.79 mg/dL (ref 0.50–1.35)
Calcium: 9.6 mg/dL (ref 8.4–10.5)
Chloride: 103 mEq/L (ref 96–112)
GFR calc Af Amer: >90 mL/min (ref >90)
GFR calc non Af Amer: 80 mL/min — ABNORMAL LOW (ref >90)
Glucose, Bld: 126 mg/dL — ABNORMAL HIGH (ref 70–99)
Potassium: 3.9 mEq/L (ref 3.7–5.3)
Sodium: 141 mEq/L (ref 137–147)
TOTAL PROTEIN: 7.3 g/dL (ref 6.0–8.3)

## 2014-06-26 LAB — URINALYSIS, ROUTINE W REFLEX MICROSCOPIC
Glucose, UA: NEGATIVE mg/dL
Hgb urine dipstick: NEGATIVE
Ketones, ur: NEGATIVE mg/dL
Leukocytes, UA: NEGATIVE
NITRITE: NEGATIVE
Protein, ur: NEGATIVE mg/dL
Specific Gravity, Urine: 1.03 (ref 1.005–1.030)
UROBILINOGEN UA: 1 mg/dL (ref 0.0–1.0)
pH: 5.5 (ref 5.0–8.0)

## 2014-06-26 LAB — GLUCOSE, CAPILLARY
GLUCOSE-CAPILLARY: 127 mg/dL — AB (ref 70–99)
GLUCOSE-CAPILLARY: 146 mg/dL — AB (ref 70–99)
Glucose-Capillary: 154 mg/dL — ABNORMAL HIGH (ref 70–99)

## 2014-06-26 MED ORDER — ENOXAPARIN SODIUM 40 MG/0.4ML ~~LOC~~ SOLN
40.0000 mg | SUBCUTANEOUS | Status: DC
  Administered 2014-06-27: 40 mg via SUBCUTANEOUS
  Filled 2014-06-26 (×3): qty 0.4

## 2014-06-26 MED ORDER — POTASSIUM CHLORIDE CRYS ER 20 MEQ PO TBCR
10.0000 meq | EXTENDED_RELEASE_TABLET | Freq: Every day | ORAL | Status: DC
  Filled 2014-06-26: qty 0.5

## 2014-06-26 MED ORDER — METOPROLOL SUCCINATE ER 50 MG PO TB24
50.0000 mg | ORAL_TABLET | Freq: Every day | ORAL | Status: DC
  Administered 2014-06-26: 50 mg via ORAL
  Filled 2014-06-26 (×2): qty 1

## 2014-06-26 MED ORDER — ACETAMINOPHEN 325 MG PO TABS
325.0000 mg | ORAL_TABLET | Freq: Four times a day (QID) | ORAL | Status: DC | PRN
  Administered 2014-06-26: 325 mg via ORAL
  Filled 2014-06-26: qty 1

## 2014-06-26 MED ORDER — INSULIN ASPART 100 UNIT/ML ~~LOC~~ SOLN
0.0000 [IU] | Freq: Three times a day (TID) | SUBCUTANEOUS | Status: DC
  Administered 2014-06-27: 3 [IU] via SUBCUTANEOUS
  Administered 2014-06-27: 2 [IU] via SUBCUTANEOUS
  Administered 2014-06-27: 3 [IU] via SUBCUTANEOUS

## 2014-06-26 MED ORDER — ONDANSETRON HCL 4 MG/2ML IJ SOLN
4.0000 mg | Freq: Once | INTRAMUSCULAR | Status: AC
  Administered 2014-06-26: 4 mg via INTRAVENOUS
  Filled 2014-06-26: qty 2

## 2014-06-26 MED ORDER — DEXTROSE-NACL 5-0.9 % IV SOLN
INTRAVENOUS | Status: DC
  Administered 2014-06-26 – 2014-06-28 (×4): via INTRAVENOUS
  Administered 2014-06-28: 100 mL via INTRAVENOUS

## 2014-06-26 MED ORDER — PANTOPRAZOLE SODIUM 40 MG PO TBEC
40.0000 mg | DELAYED_RELEASE_TABLET | Freq: Every day | ORAL | Status: DC
  Administered 2014-06-26 – 2014-06-27 (×2): 40 mg via ORAL
  Filled 2014-06-26 (×3): qty 1

## 2014-06-26 MED ORDER — SODIUM CHLORIDE 0.9 % IV SOLN
INTRAVENOUS | Status: DC
  Administered 2014-06-26: 15:00:00 via INTRAVENOUS

## 2014-06-26 MED ORDER — NITROGLYCERIN 0.4 MG SL SUBL: 0.4000 mg | SUBLINGUAL_TABLET | SUBLINGUAL | Status: DC | PRN

## 2014-06-26 MED ORDER — IOHEXOL 300 MG/ML  SOLN
50.0000 mL | Freq: Once | INTRAMUSCULAR | Status: AC | PRN
  Administered 2014-06-26: 50 mL via ORAL

## 2014-06-26 MED ORDER — ONDANSETRON HCL 4 MG/2ML IJ SOLN: 4.0000 mg | Freq: Four times a day (QID) | INTRAMUSCULAR | Status: DC | PRN

## 2014-06-26 MED ORDER — IOHEXOL 300 MG/ML  SOLN
100.0000 mL | Freq: Once | INTRAMUSCULAR | Status: AC | PRN
  Administered 2014-06-26: 100 mL via INTRAVENOUS

## 2014-06-26 MED ORDER — HYDROCHLOROTHIAZIDE 25 MG PO TABS
25.0000 mg | ORAL_TABLET | Freq: Every morning | ORAL | Status: DC
  Administered 2014-06-27: 25 mg via ORAL
  Filled 2014-06-26: qty 1

## 2014-06-26 MED ORDER — MORPHINE SULFATE 2 MG/ML IJ SOLN: 2.0000 mg | INTRAMUSCULAR | Status: DC | PRN

## 2014-06-26 MED ORDER — ISOSORBIDE MONONITRATE ER 60 MG PO TB24
60.0000 mg | ORAL_TABLET | Freq: Every evening | ORAL | Status: DC
  Administered 2014-06-26: 60 mg via ORAL
  Filled 2014-06-26 (×2): qty 1

## 2014-06-26 MED ORDER — SIMVASTATIN 10 MG PO TABS
20.0000 mg | ORAL_TABLET | Freq: Every evening | ORAL | Status: DC
  Administered 2014-06-26 – 2014-06-28 (×3): 20 mg via ORAL
  Filled 2014-06-26: qty 2
  Filled 2014-06-26 (×3): qty 1

## 2014-06-26 MED ORDER — AMLODIPINE BESYLATE 10 MG PO TABS
10.0000 mg | ORAL_TABLET | Freq: Every evening | ORAL | Status: DC
  Administered 2014-06-26: 10 mg via ORAL
  Filled 2014-06-26 (×2): qty 1

## 2014-06-26 NOTE — ED Provider Notes (Signed)
CSN: [id number]     Arrival date & time 06/26/14  1346 History   First MD Initiated Contact with Patient 06/26/14 1351     Chief Complaint  Patient presents with  . Inguinal Hernia     (Consider location/radiation/quality/duration/timing/severity/associated sxs/prior Treatment) HPI Patient reports he had a left inguinal repair done in 1970s, his wife reports he has had swelling in his left lower abdomen for several years. He reports it seems to be getting worse over the past few months. He was seen by his PCP 4 months ago and was advised to observe the hernia. He was seen in the office again today and was sent to the ED. Patient states that when he eats "heavy" foods he gets a sharp pain in his left abdomen. He has to massage a swollen area in his left abdomen and then about 30 minutes later he is able to have a bowel movement. He also feels like he has a swollen area in his left scrotum that can also interfere with him having a bowel movement. He has had nausea without vomiting. He states he has lost 60-70 pounds in the past year because he's only eating soft food or soup. He states his movements have gotten smaller and are now pencil-sized. He states the last time he had blood was when he had a flareup of his diverticulitis a year ago. He states he cannot take aspirin or he will have rectal bleeding.  PCP Dr Electa Sniff  Past Medical History  Diagnosis Date  . Diabetes   . HTN (hypertension)   . Coronary atherosclerosis of unspecified type of vessel, native or graft     CABG 1993, LIMA to LAD, SVG to obtuse marginal one, SVG to posterior descending, stable angina  . Hyperlipidemia `  . GERD (gastroesophageal reflux disease)   . Nephrolithiasis   . Chronic rhinitis   . Glaucoma   . Melanoma of skin, site unspecified   . Hernia     left side  . Complication of anesthesia   . PONV (postoperative nausea and vomiting)   . Anemia     on iron  . [facility] spotted fever 2-3 yrs ago  .  CAD (coronary artery disease)    Past Surgical History  Procedure Laterality Date  . Right leg fracture age 82    . Hernia repair  1970's  . Coronary artery bypass graft  1990's    x 3  . Tonsillectomy  age 13 or 47  . Colonoscopy with propofol N/A 11/27/2013    Procedure: COLONOSCOPY WITH PROPOFOL;  Surgeon: Garlan Fair, MD;  Location: [facility];  Service: Endoscopy;  Laterality: N/A;   Family History  Problem Relation Age of Onset  . CAD Father   . Hyperlipidemia Father   . Cancer Mother    History  Substance Use Topics  . Smoking status: Light Tobacco Smoker    Types: Cigars  . Smokeless tobacco: Never Used  . Alcohol Use: No  Lives at home Lives with spouse  Review of Systems  All other systems reviewed and are negative.     Allergies  Aspirin; Lipitor; Lopressor; and Penicillins  Home Medications   Prior to Admission medications   Medication Sig Start Date End Date Taking? Authorizing Provider  acetaminophen (TYLENOL) 325 MG tablet Take 325 mg by mouth every 6 (six) hours as needed for mild pain.    Yes Historical Provider, MD  amLODipine (NORVASC) 10 MG tablet Take 10 mg by  mouth every evening.    Yes Historical Provider, MD  Cyanocobalamin (VITAMIN B-12 PO) Take 1 tablet by mouth daily.   Yes Historical Provider, MD  ferrous fumarate (HEMOCYTE - 106 MG FE) 325 (106 FE) MG TABS tablet Take 1 tablet by mouth daily.    Yes Historical Provider, MD  hydrochlorothiazide (HYDRODIURIL) 25 MG tablet Take 25 mg by mouth every morning.    Yes Historical Provider, MD  metFORMIN (GLUCOPHAGE) 1000 MG tablet Take 1,000 mg by mouth daily with breakfast.    Yes Historical Provider, MD  metoprolol succinate (TOPROL-XL) 50 MG 24 hr tablet TAKE 1 TABLET DAILY 12/01/13  Yes Belva Crome III, MD  Multiple Vitamin (MULTIVITAMIN WITH MINERALS) TABS tablet Take 1 tablet by mouth daily.   Yes Historical Provider, MD  nitroGLYCERIN (NITROSTAT) 0.4 MG SL tablet Place 0.4 mg under  the tongue every 5 (five) minutes as needed for chest pain.   Yes Historical Provider, MD  pantoprazole (PROTONIX) 40 MG tablet Take 40 mg by mouth daily.   Yes Historical Provider, MD  potassium chloride (K-DUR,KLOR-CON) 10 MEQ tablet Take 10 mEq by mouth daily.    Yes Historical Provider, MD  simvastatin (ZOCOR) 20 MG tablet Take 20 mg by mouth every evening.   Yes Historical Provider, MD  isosorbide mononitrate (IMDUR) 60 MG 24 hr tablet Take 60 mg by mouth every evening.     Historical Provider, MD   BP 135/67  Pulse 75  Temp(Src) 97.7 F (36.5 C) (Oral)  Resp 18  SpO2 98%  Vital signs normal   Physical Exam  Nursing note and vitals reviewed. Constitutional: He is oriented to person, place, and time.  Non-toxic appearance. He does not appear ill. No distress.  Elderly male  HENT:  Head: Normocephalic and atraumatic.  Right Ear: External ear normal.  Left Ear: External ear normal.  Nose: Nose normal. No mucosal edema or rhinorrhea.  Mouth/Throat: Oropharynx is clear and moist and mucous membranes are normal. No dental abscesses or uvula swelling.  Eyes: Conjunctivae and EOM are normal. Pupils are equal, round, and reactive to light.  Neck: Normal range of motion and full passive range of motion without pain. Neck supple.  Cardiovascular: Normal rate, regular rhythm and normal heart sounds.  Exam reveals no gallop and no friction rub.   No murmur heard. Pulmonary/Chest: Effort normal and breath sounds normal. No respiratory distress. He has no wheezes. He has no rhonchi. He has no rales. He exhibits no tenderness and no crepitus.  Abdominal: Soft. Normal appearance and bowel sounds are normal. He exhibits no distension. There is no tenderness. There is no rebound and no guarding.    Pt has a moderate upper ventral hernia when he strains. On standing he has a bulge about the size of a plum in the LLQ well above the inguinal ring. When he lays down it is hard to find.  He also has a  fullness in his left upper scrotal sac.   Musculoskeletal: Normal range of motion. He exhibits no edema and no tenderness.  Moves all extremities well.   Neurological: He is alert and oriented to person, place, and time. He has normal strength. No cranial nerve deficit.  Skin: Skin is warm, dry and intact. No rash noted. No erythema. No pallor.  Psychiatric: He has a normal mood and affect. His speech is normal and behavior is normal. His mood appears not anxious.    ED Course  Procedures (including critical care time)  Medications  ondansetron (ZOFRAN) injection 4 mg (4 mg Intravenous Given 06/26/14 1443)  iohexol (OMNIPAQUE) 300 MG/ML solution 50 mL (50 mLs Oral Contrast Given 06/26/14 1440)  iohexol (OMNIPAQUE) 300 MG/ML solution 100 mL (100 mLs Intravenous Contrast Given 06/26/14 1618)     16:20 Evidentally patient was seen by surgery and admitted.   Labs Review Results for orders placed during the hospital encounter of 06/26/14  CBC WITH DIFFERENTIAL      Result Value Ref Range   WBC 10.3  4.0 - 10.5 K/uL   RBC 3.75 (*) 4.22 - 5.81 MIL/uL   Hemoglobin 11.4 (*) 13.0 - 17.0 g/dL   HCT 35.0 (*) 39.0 - 52.0 %   MCV 93.3  78.0 - 100.0 fL   MCH 30.4  26.0 - 34.0 pg   MCHC 32.6  30.0 - 36.0 g/dL   RDW 14.3  11.5 - 15.5 %   Platelets 370  150 - 400 K/uL   Neutrophils Relative % 75  43 - 77 %   Neutro Abs 7.7  1.7 - 7.7 K/uL   Lymphocytes Relative 16  12 - 46 %   Lymphs Abs 1.7  0.7 - 4.0 K/uL   Monocytes Relative 7  3 - 12 %   Monocytes Absolute 0.7  0.1 - 1.0 K/uL   Eosinophils Relative 2  0 - 5 %   Eosinophils Absolute 0.2  0.0 - 0.7 K/uL   Basophils Relative 0  0 - 1 %   Basophils Absolute 0.0  0.0 - 0.1 K/uL  COMPREHENSIVE METABOLIC PANEL      Result Value Ref Range   Sodium 141  137 - 147 mEq/L   Potassium 3.9  3.7 - 5.3 mEq/L   Chloride 103  96 - 112 mEq/L   CO2 27  19 - 32 mEq/L   Glucose, Bld 126 (*) 70 - 99 mg/dL   BUN 20  6 - 23 mg/dL   Creatinine, Ser 0.79   0.50 - 1.35 mg/dL   Calcium 9.6  8.4 - 10.5 mg/dL   Total Protein 7.3  6.0 - 8.3 g/dL   Albumin 3.7  3.5 - 5.2 g/dL   AST 17  0 - 37 U/L   ALT 12  0 - 53 U/L   Alkaline Phosphatase 76  39 - 117 U/L   Total Bilirubin 0.4  0.3 - 1.2 mg/dL   GFR calc non Af Amer 80 (*) >90 mL/min   GFR calc Af Amer >90  >90 mL/min   Anion gap 11  5 - 15  URINALYSIS, ROUTINE W REFLEX MICROSCOPIC      Result Value Ref Range   Color, Urine YELLOW  YELLOW   APPearance CLEAR  CLEAR   Specific Gravity, Urine 1.030  1.005 - 1.030   pH 5.5  5.0 - 8.0   Glucose, UA NEGATIVE  NEGATIVE mg/dL   Hgb urine dipstick NEGATIVE  NEGATIVE   Bilirubin Urine SMALL (*) NEGATIVE   Ketones, ur NEGATIVE  NEGATIVE mg/dL   Protein, ur NEGATIVE  NEGATIVE mg/dL   Urobilinogen, UA 1.0  0.0 - 1.0 mg/dL   Nitrite NEGATIVE  NEGATIVE   Leukocytes, UA NEGATIVE  NEGATIVE   Laboratory interpretation all normal except mild anemia     Imaging Review No results found.  AP CT pending.   EKG Interpretation None      MDM   Final diagnoses:  Hernia, inguinal, left  Ventral hernia, recurrence not specified    Plan admission  to surgery   Rolland Porter, MD, Abram Sander     Janice Norrie, MD 06/26/14 [phone]

## 2014-06-26 NOTE — ED Notes (Signed)
Pt presents with c/o left inguinal hernia. Pt went for a routine visit with his doctor today and he was told to come here reference the hernia. Pt says he has had the hernia for years but it has gotten worse over the last few months. Pt is in NAD, ambulatory to triage.

## 2014-06-26 NOTE — H&P (Signed)
Chief Complaint: left inguinal hernia  HPI: Kenneth [name] is a 80 year old male with a history of diabetes mellitus, CAD s/p CABG 1993 and hypertension who presents to [facility] after seeing his PCP today for a routine follow up.  He complained of increased left inguinal pain.  The patient reports a hernia at this spot over the past 5 years.  He endorses over the past 2 months it has progressively worsened.  Location is LLQ and without radiation.  Time pattern is intermittent.  Aggravated by oral intake.  He has changed his diet to soft food to help alleviate symptoms.  After each meal the bulge increases, he then massages it and is able to reduce it.  Then about 20 minutes later, he has a bowel movement.  BMs have been normal.  He denies melena or hematochezia.  He reports 60-70lb weight loss over the last year.  He had a colonoscopy in 2014 which showed polyps and diverticulosis.  He eats smaller meals and does not eat as much due to the hernia.  He reports nausea and vomiting.  Denies any blood thinner use, history of GI bleed in the past with ASA use.  He denies a cardiac history.  He is able to walk a flight a stairs without any shortness of breath.  He was seen by Dr. Pernell Dupre in October and reports stable angina.  His last oral intake was last night.  Currently, he is drinking his contrast and is without nausea or vomiting.  He reports mild pain.  His work up shows a normal white count, renal function.  A CT of abdomen and pelvis is pending.    Past Medical History  Diagnosis Date  . Diabetes   . HTN (hypertension)   . Coronary atherosclerosis of unspecified type of vessel, native or graft     CABG 1993, LIMA to LAD, SVG to obtuse marginal one, SVG to posterior descending, stable angina  . Hyperlipidemia `  . GERD (gastroesophageal reflux disease)   . Nephrolithiasis   . Chronic rhinitis   . Glaucoma   . Melanoma of skin, site unspecified   . Hernia     left side  . Complication of  anesthesia   . PONV (postoperative nausea and vomiting)   . Anemia     on iron  . [facility] spotted fever 2-3 yrs ago    Past Surgical History  Procedure Laterality Date  . Right leg fracture age 39    . Hernia repair  1970's  . Coronary artery bypass graft  1990's    x 3  . Tonsillectomy  age 89 or 32  . Colonoscopy with propofol N/A 11/27/2013    Procedure: COLONOSCOPY WITH PROPOFOL;  Surgeon: Garlan Fair, MD;  Location: [facility];  Service: Endoscopy;  Laterality: N/A;    Family History  Problem Relation Age of Onset  . CAD Father   . Hyperlipidemia Father   . Cancer Mother    Social History:  reports that he has been smoking Cigars.  He has never used smokeless tobacco. He reports that he does not drink alcohol or use illicit drugs.  Allergies:  Allergies  Allergen Reactions  . Aspirin     Bleeding.    . Lipitor [Atorvastatin] Other (See Comments)    tired  . Lopressor [Metoprolol Tartrate] Other (See Comments)    Sleepy all the time  . Penicillins Rash   Medication: Amlodipine 10mg  once daily Metformin 1000mg  PO daily Metoprolol  XL 50mg  1 tablet PO daily MVI PO once daily Simvastatin 20mg  QHS imdur 50mg  PO daily Vitamin B-12 10mg  PO daily Ferrous furnarate 325mg  1 tablet PO daily HCTZ 25mg  PO daily NTG .4mg  SL tablet PRN for chest pains pantoprazole 40mg  PO daily K-dur 40mEq 1 tablet PO daily.    (Not in a hospital admission)  Results for orders placed during the hospital encounter of 06/26/14 (from the past 48 hour(s))  URINALYSIS, ROUTINE W REFLEX MICROSCOPIC     Status: Abnormal   Collection Time    06/26/14  2:24 PM      Result Value Ref Range   Color, Urine YELLOW  YELLOW   APPearance CLEAR  CLEAR   Specific Gravity, Urine 1.030  1.005 - 1.030   pH 5.5  5.0 - 8.0   Glucose, UA NEGATIVE  NEGATIVE mg/dL   Hgb urine dipstick NEGATIVE  NEGATIVE   Bilirubin Urine SMALL (*) NEGATIVE   Ketones, ur NEGATIVE  NEGATIVE mg/dL   Protein, ur  NEGATIVE  NEGATIVE mg/dL   Urobilinogen, UA 1.0  0.0 - 1.0 mg/dL   Nitrite NEGATIVE  NEGATIVE   Leukocytes, UA NEGATIVE  NEGATIVE   Comment: MICROSCOPIC NOT DONE ON URINES WITH NEGATIVE PROTEIN, BLOOD, LEUKOCYTES, NITRITE, OR GLUCOSE <1000 mg/dL.  CBC WITH DIFFERENTIAL     Status: Abnormal   Collection Time    06/26/14  2:29 PM      Result Value Ref Range   WBC 10.3  4.0 - 10.5 K/uL   RBC 3.75 (*) 4.22 - 5.81 MIL/uL   Hemoglobin 11.4 (*) 13.0 - 17.0 g/dL   HCT 35.0 (*) 39.0 - 52.0 %   MCV 93.3  78.0 - 100.0 fL   MCH 30.4  26.0 - 34.0 pg   MCHC 32.6  30.0 - 36.0 g/dL   RDW 14.3  11.5 - 15.5 %   Platelets 370  150 - 400 K/uL   Neutrophils Relative % 75  43 - 77 %   Neutro Abs 7.7  1.7 - 7.7 K/uL   Lymphocytes Relative 16  12 - 46 %   Lymphs Abs 1.7  0.7 - 4.0 K/uL   Monocytes Relative 7  3 - 12 %   Monocytes Absolute 0.7  0.1 - 1.0 K/uL   Eosinophils Relative 2  0 - 5 %   Eosinophils Absolute 0.2  0.0 - 0.7 K/uL   Basophils Relative 0  0 - 1 %   Basophils Absolute 0.0  0.0 - 0.1 K/uL   No results found.  Review of Systems  All other systems reviewed and are negative.   Blood pressure 135/67, pulse 75, temperature 97.7 F (36.5 C), temperature source Oral, resp. rate 18, SpO2 98.00%. Physical Exam  Constitutional: He is oriented to person, place, and time. He appears well-developed and well-nourished. No distress.  HENT:  Head: Normocephalic and atraumatic.  Mouth/Throat: No oropharyngeal exudate.  Eyes: Conjunctivae are normal. Pupils are equal, round, and reactive to light.  Neck: Normal range of motion. Neck supple.  Cardiovascular: Normal rate, regular rhythm, normal heart sounds and intact distal pulses.  Exam reveals no gallop and no friction rub.   No murmur heard. Respiratory: Effort normal and breath sounds normal. No respiratory distress. He has no wheezes. He has no rales. He exhibits no tenderness.  GI: Soft. Bowel sounds are normal.    Musculoskeletal:  Normal range of motion. He exhibits no edema and no tenderness.  Lymphadenopathy:    He has no  cervical adenopathy.  Neurological: He is alert and oriented to person, place, and time.  Skin: Skin is warm and dry. No rash noted. He is not diaphoretic. No erythema. No pallor.  Psychiatric: He has a normal mood and affect. His behavior is normal. Judgment and thought content normal.     Assessment/Plan Left inguinal hernia And possible ventral hernia -await CT  -admit, will likely proceed with a hernia repair in AM unless more urgent findings on CT scan are revealed -NPO -IVF -pain control -continue with home meds -obtain cardiac clearance given history of CAD/CABG and stable angina -SCD/lovenox Hypertension-c/w home meds Diabetes mellitus-hold metformin, SSI, CBGs CAD-c/w home meds, cardiology consult   Akif Weldy  ANP-BC  06/26/2014, 3:12 PM

## 2014-06-26 NOTE — ED Notes (Addendum)
Attempted to call report to floor, RN to call back. 

## 2014-06-27 ENCOUNTER — Encounter (HOSPITAL_COMMUNITY): Payer: Self-pay | Admitting: General Surgery

## 2014-06-27 DIAGNOSIS — E139 Other specified diabetes mellitus without complications: Secondary | ICD-10-CM

## 2014-06-27 DIAGNOSIS — R112 Nausea with vomiting, unspecified: Secondary | ICD-10-CM

## 2014-06-27 DIAGNOSIS — Z01810 Encounter for preprocedural cardiovascular examination: Secondary | ICD-10-CM

## 2014-06-27 DIAGNOSIS — K4090 Unilateral inguinal hernia, without obstruction or gangrene, not specified as recurrent: Secondary | ICD-10-CM

## 2014-06-27 DIAGNOSIS — I2510 Atherosclerotic heart disease of native coronary artery without angina pectoris: Secondary | ICD-10-CM

## 2014-06-27 LAB — CBC
HCT: 29 % — ABNORMAL LOW (ref 39.0–52.0)
Hemoglobin: 9.5 g/dL — ABNORMAL LOW (ref 13.0–17.0)
MCH: 30.5 pg (ref 26.0–34.0)
MCHC: 32.8 g/dL (ref 30.0–36.0)
MCV: 93.2 fL (ref 78.0–100.0)
PLATELETS: 310 10*3/uL (ref 150–400)
RBC: 3.11 MIL/uL — ABNORMAL LOW (ref 4.22–5.81)
RDW: 14.4 % (ref 11.5–15.5)
WBC: 5.5 10*3/uL (ref 4.0–10.5)

## 2014-06-27 LAB — BASIC METABOLIC PANEL
Anion gap: 10 (ref 5–15)
BUN: 11 mg/dL (ref 6–23)
CALCIUM: 8.6 mg/dL (ref 8.4–10.5)
CO2: 26 mEq/L (ref 19–32)
Chloride: 104 mEq/L (ref 96–112)
Creatinine, Ser: 0.78 mg/dL (ref 0.50–1.35)
GFR, EST NON AFRICAN AMERICAN: 81 mL/min — AB (ref >90)
Glucose, Bld: 150 mg/dL — ABNORMAL HIGH (ref 70–99)
POTASSIUM: 3.4 meq/L — AB (ref 3.7–5.3)
Sodium: 140 mEq/L (ref 137–147)

## 2014-06-27 LAB — GLUCOSE, CAPILLARY
GLUCOSE-CAPILLARY: 152 mg/dL — AB (ref 70–99)
GLUCOSE-CAPILLARY: 174 mg/dL — AB (ref 70–99)
GLUCOSE-CAPILLARY: 44 mg/dL — AB (ref 70–99)
GLUCOSE-CAPILLARY: 114 mg/dL — AB (ref 70–99)
GLUCOSE-CAPILLARY: 98 mg/dL (ref 70–99)
Glucose-Capillary: 160 mg/dL — ABNORMAL HIGH (ref 70–99)
Glucose-Capillary: 150 mg/dL — ABNORMAL HIGH (ref 70–99)
Glucose-Capillary: 131 mg/dL — ABNORMAL HIGH (ref 70–99)

## 2014-06-27 LAB — SURGICAL PCR SCREEN
MRSA, PCR: NEGATIVE
STAPHYLOCOCCUS AUREUS: NEGATIVE

## 2014-06-27 MED ORDER — ISOSORBIDE DINITRATE 20 MG PO TABS
20.0000 mg | ORAL_TABLET | Freq: Three times a day (TID) | ORAL | Status: DC
  Administered 2014-06-27 (×2): 20 mg via ORAL
  Filled 2014-06-27 (×6): qty 1

## 2014-06-27 MED ORDER — DEXTROSE 50 % IV SOLN: 50.0000 mL | Freq: Once | INTRAVENOUS | Status: AC | PRN

## 2014-06-27 MED ORDER — POTASSIUM CHLORIDE CRYS ER 10 MEQ PO TBCR
10.0000 meq | EXTENDED_RELEASE_TABLET | Freq: Every day | ORAL | Status: DC
  Administered 2014-06-27: 10 meq via ORAL
  Filled 2014-06-27 (×2): qty 1

## 2014-06-27 MED ORDER — SODIUM CHLORIDE 0.9 % IV BOLUS (SEPSIS): 500.0000 mL | Freq: Once | INTRAVENOUS | Status: DC

## 2014-06-27 MED ORDER — DEXTROSE 5 % IV SOLN
2.0000 g | INTRAVENOUS | Status: AC
  Administered 2014-06-28: 2 g via INTRAVENOUS
  Filled 2014-06-27: qty 2

## 2014-06-27 MED ORDER — DEXTROSE 50 % IV SOLN
INTRAVENOUS | Status: AC
  Administered 2014-06-27: 25 mL
  Filled 2014-06-27: qty 50

## 2014-06-27 MED ORDER — PROPRANOLOL HCL 20 MG PO TABS
20.0000 mg | ORAL_TABLET | Freq: Three times a day (TID) | ORAL | Status: DC
  Administered 2014-06-27 (×2): 20 mg via ORAL
  Filled 2014-06-27 (×6): qty 1

## 2014-06-27 MED ORDER — INSULIN ASPART 100 UNIT/ML ~~LOC~~ SOLN
0.0000 [IU] | Freq: Three times a day (TID) | SUBCUTANEOUS | Status: DC
  Administered 2014-06-28: 2 [IU] via SUBCUTANEOUS
  Administered 2014-06-28: 1 [IU] via SUBCUTANEOUS

## 2014-06-27 NOTE — Progress Notes (Signed)
INITIAL NUTRITION ASSESSMENT  DOCUMENTATION CODES Per approved criteria  -Not Applicable   INTERVENTION: - Diet advancement per MD - Recommend Ensure Complete BID once diet advanced - RD to continue to monitor   NUTRITION DIAGNOSIS: Inadequate oral intake related to inability to eat as evidenced by NPO.   Goal: Advance diet as tolerated to diabetic diet  Monitor:  Weights, labs, diet advancement   Reason for Assessment: Malnutrition screening tool   80 y.o. male  Admitting Dx: Left inguinal hernia   ASSESSMENT: Kenneth [name] is a 80 year old male with a history of diabetes mellitus, CAD s/p CABG 1993 and hypertension who presents to [facility] after seeing his PCP today for a routine follow up. He complained of increased left inguinal pain. The patient reports a hernia at this spot over the past 5 years. He endorses over the past 2 months it has progressively worsened. Location is LLQ and without radiation. Time pattern is intermittent. Aggravated by oral intake. He has changed his diet to soft food to help alleviate symptoms. After each meal the bulge increases, he then massages it and is able to reduce it. Then about 20 minutes later, he has a bowel movement. BMs have been normal. He denies melena or hematochezia. He reports 60-70lb weight loss over the last year. He had a colonoscopy in 2014 which showed polyps and diverticulosis. He eats smaller meals and does not eat as much due to the hernia. He reports nausea and vomiting.   - Pt alone in room, c/o being hungry - States in the past 2 months or so he's been eating just 1 meal/day of things like soups - Said he wants to eat more but he can't due to LLQ pain  - Also says that he likes to go out to eat a lot and get things like sausage biscuits and fried chicken however soup is the easiest thing he tolerates - Has not been on nutritional supplements at home - Nutrition focused physical exam performed on upper body which was WNL (pt  wearing SCDs on legs) - Pt denies any decrease in muscle strength - Per weight trend, pt's weight up 4 pounds since last year    Height: Ht Readings from Last 1 Encounters:  06/26/14 5\' 11"  (1.803 m)    Weight: Wt Readings from Last 1 Encounters:  06/26/14 167 lb (75.751 kg)    Ideal Body Weight: 172 lbs   % Ideal Body Weight: 97%  Wt Readings from Last 10 Encounters:  06/26/14 167 lb (75.751 kg)  09/28/13 163 lb 12.8 oz (74.299 kg)    Usual Body Weight: 227 lbs 1 year ago per wife   % Usual Body Weight: 73%  BMI:  Body mass index is 23.3 kg/(m^2).  Estimated Nutritional Needs: Kcal: 1900-2100 Protein: 90-110g Fluid: 1.9-2.1L/day   Skin: intact   Diet Order: NPO  EDUCATION NEEDS: -No education needs identified at this time   Intake/Output Summary (Last 24 hours) at 06/27/14 1038 Last data filed at 06/27/14 1000  Gross per 24 hour  Intake 1303.34 ml  Output   2050 ml  Net -746.66 ml    Last BM: 7/22  Labs:   Recent Labs Lab 06/26/14 1429 06/27/14 0516  NA 141 140  K 3.9 3.4*  CL 103 104  CO2 27 26  BUN 20 11  CREATININE 0.79 0.78  CALCIUM 9.6 8.6  GLUCOSE 126* 150*    CBG (last 3)   Recent Labs  06/26/14 2352 06/27/14 0410  06/27/14 0745  GLUCAP 154* 160* 150*    Scheduled Meds: . amLODipine  10 mg Oral QPM  . enoxaparin (LOVENOX) injection  40 mg Subcutaneous Q24H  . hydrochlorothiazide  25 mg Oral q morning - 10a  . insulin aspart  0-15 Units Subcutaneous TID WC  . isosorbide mononitrate  60 mg Oral QPM  . metoprolol succinate  50 mg Oral Daily  . pantoprazole  40 mg Oral Daily  . potassium chloride  10 mEq Oral Daily  . simvastatin  20 mg Oral QPM    Continuous Infusions: . dextrose 5 % and 0.9% NaCl 100 mL/hr at 06/26/14 1658    Past Medical History  Diagnosis Date  . Diabetes   . HTN (hypertension)   . Coronary atherosclerosis of unspecified type of vessel, native or graft     CABG 1993, LIMA to LAD, SVG to obtuse  marginal one, SVG to posterior descending, stable angina  . Hyperlipidemia `  . GERD (gastroesophageal reflux disease)   . Nephrolithiasis   . Chronic rhinitis   . Glaucoma   . Melanoma of skin, site unspecified   . Hernia     left side  . Complication of anesthesia   . PONV (postoperative nausea and vomiting)   . Anemia     on iron  . [facility] spotted fever 2-3 yrs ago  . CAD (coronary artery disease)     Past Surgical History  Procedure Laterality Date  . Right leg fracture age 61    . Hernia repair  1970's  . Coronary artery bypass graft  1990's    x 3  . Tonsillectomy  age 69 or 26  . Colonoscopy with propofol N/A 11/27/2013    Procedure: COLONOSCOPY WITH PROPOFOL;  Surgeon: Garlan Fair, MD;  Location: [facility];  Service: Endoscopy;  Laterality: N/A;    Carlis Stable MS, [place], LDN [phone] Pager [phone] Weekend/After Hours Pager

## 2014-06-27 NOTE — Progress Notes (Addendum)
Hypoglycemic Event  CBG: 44  Treatment: Amp D50 25 ml  Symptoms: Sweating, lethargic, low blood sugar  Follow-up CBG: Time:1936 CBG Result:114  Possible Reasons for Event: did not eat much of dinner tray  Comments/MD notified Dr Zella Richer    Kenneth [name]  Remember to initiate Hypoglycemia Order Set & complete

## 2014-06-27 NOTE — Care Management Note (Signed)
    Page 1 of 1   06/27/2014     3:29:18 PM CARE MANAGEMENT NOTE 06/27/2014  Patient:  Kenneth [name], Kenneth [name]   Account Number:  [id number]  Date Initiated:  06/27/2014  Documentation initiated by:  Dessa Phi  Subjective/Objective Assessment:   80 Y/O M ADMITTED W/L INGUINAL HERNIA.     Action/Plan:   FROM HOME.   Anticipated DC Date:  06/28/2014   Anticipated DC Plan:  [place]  CM consult      Choice offered to / List presented to:             Status of service:  In process, will continue to follow Medicare Important Message given?   (If response is "NO", the following Medicare IM given date fields will be blank) Date Medicare IM given:   Medicare IM given by:   Date Additional Medicare IM given:   Additional Medicare IM given by:    Discharge Disposition:    Per UR Regulation:  Reviewed for med. necessity/level of care/duration of stay  If discussed at Long Length of Stay Meetings, dates discussed:    Comments:  06/27/14 Aliceson Dolbow RN,BSN NCM [phone] CARDIO-CLEARED FOR SX.PLAN-L INGUINAL HERNIA REPAIR.

## 2014-06-27 NOTE — Progress Notes (Signed)
Patient ID: Kenneth [name], male   DOB: 12-Sep-1928, 80 y.o.   MRN: [id number]  Subjective: No n/v.  Passing flatus.  Increased urinary frequency.    Objective:  Vital signs:  Filed Vitals:   06/26/14 1719 06/26/14 2159 06/27/14 0222 06/27/14 0551  BP: 159/61 119/56 99/66 100/65  Pulse: 59 55 68 62  Temp: 97.7 F (36.5 C) 98.9 F (37.2 C) 97.9 F (36.6 C) 98 F (36.7 C)  TempSrc:  Oral Oral Oral  Resp: _0 Height: _1  (1.803 m)     Weight: 167 lb (75.751 kg)     SpO2: 99% 99% 96% 96%    Last BM Date: 06/26/14  Intake/Output   Yesterday:  07/22 0701 - 07/23 0700 In: 1303.3 [I.V.:1303.3] Out: 1850 [Urine:1850] This shift:    I/O last 3 completed shifts: In: 1303.3 [I.V.:1303.3] Out: 1850 [Urine:1850]    Physical Exam: General: Pt awake/alert/oriented x4 in no acute distress Chest: cta.  No chest wall pain w good excursion CV:  Pulses intact.  Regular rhythm MS: Normal AROM mjr joints.  No obvious deformity Abdomen: Soft.  Nondistended. Left inguinal hernia.  No evidence of peritonitis.  Ext:  SCDs BLE.  No mjr edema.  No cyanosis Skin: No petechiae / purpura   Problem List:   Active Problems:   Left inguinal hernia    Results:   Labs: Results for orders placed during the hospital encounter of 06/26/14 (from the past 48 hour(s))  URINALYSIS, ROUTINE W REFLEX MICROSCOPIC     Status: Abnormal   Collection Time    06/26/14  2:24 PM      Result Value Ref Range   Color, Urine YELLOW  YELLOW   APPearance CLEAR  CLEAR   Specific Gravity, Urine 1.030  1.005 - 1.030   pH 5.5  5.0 - 8.0   Glucose, UA NEGATIVE  NEGATIVE mg/dL   Hgb urine dipstick NEGATIVE  NEGATIVE   Bilirubin Urine SMALL (*) NEGATIVE   Ketones, ur NEGATIVE  NEGATIVE mg/dL   Protein, ur NEGATIVE  NEGATIVE mg/dL   Urobilinogen, UA 1.0  0.0 - 1.0 mg/dL   Nitrite NEGATIVE  NEGATIVE   Leukocytes, UA NEGATIVE  NEGATIVE   Comment: MICROSCOPIC NOT DONE ON URINES WITH NEGATIVE PROTEIN,  BLOOD, LEUKOCYTES, NITRITE, OR GLUCOSE <1000 mg/dL.  CBC WITH DIFFERENTIAL     Status: Abnormal   Collection Time    06/26/14  2:29 PM      Result Value Ref Range   WBC 10.3  4.0 - 10.5 K/uL   RBC 3.75 (*) 4.22 - 5.81 MIL/uL   Hemoglobin 11.4 (*) 13.0 - 17.0 g/dL   HCT 35.0 (*) 39.0 - 52.0 %   MCV 93.3  78.0 - 100.0 fL   MCH 30.4  26.0 - 34.0 pg   MCHC 32.6  30.0 - 36.0 g/dL   RDW 14.3  11.5 - 15.5 %   Platelets 370  150 - 400 K/uL   Neutrophils Relative % 75  43 - 77 %   Neutro Abs 7.7  1.7 - 7.7 K/uL   Lymphocytes Relative 16  12 - 46 %   Lymphs Abs 1.7  0.7 - 4.0 K/uL   Monocytes Relative 7  3 - 12 %   Monocytes Absolute 0.7  0.1 - 1.0 K/uL   Eosinophils Relative 2  0 - 5 %   Eosinophils Absolute 0.2  0.0 - 0.7 K/uL   Basophils Relative 0  0 -  1 %   Basophils Absolute 0.0  0.0 - 0.1 K/uL  COMPREHENSIVE METABOLIC PANEL     Status: Abnormal   Collection Time    06/26/14  2:29 PM      Result Value Ref Range   Sodium 141  137 - 147 mEq/L   Potassium 3.9  3.7 - 5.3 mEq/L   Chloride 103  96 - 112 mEq/L   CO2 27  19 - 32 mEq/L   Glucose, Bld 126 (*) 70 - 99 mg/dL   BUN 20  6 - 23 mg/dL   Creatinine, Ser 0.79  0.50 - 1.35 mg/dL   Calcium 9.6  8.4 - 10.5 mg/dL   Total Protein 7.3  6.0 - 8.3 g/dL   Albumin 3.7  3.5 - 5.2 g/dL   AST 17  0 - 37 U/L   ALT 12  0 - 53 U/L   Alkaline Phosphatase 76  39 - 117 U/L   Total Bilirubin 0.4  0.3 - 1.2 mg/dL   GFR calc non Af Amer 80 (*) >90 mL/min   GFR calc Af Amer >90  >90 mL/min   Comment: (NOTE)     The eGFR has been calculated using the CKD EPI equation.     This calculation has not been validated in all clinical situations.     eGFR's persistently <90 mL/min signify possible Chronic Kidney     Disease.   Anion gap 11  5 - 15  GLUCOSE, CAPILLARY     Status: Abnormal   Collection Time    06/26/14  5:26 PM      Result Value Ref Range   Glucose-Capillary 127 (*) 70 - 99 mg/dL  GLUCOSE, CAPILLARY     Status: Abnormal   Collection  Time    06/26/14  8:07 PM      Result Value Ref Range   Glucose-Capillary 146 (*) 70 - 99 mg/dL  GLUCOSE, CAPILLARY     Status: Abnormal   Collection Time    06/26/14 11:52 PM      Result Value Ref Range   Glucose-Capillary 154 (*) 70 - 99 mg/dL  GLUCOSE, CAPILLARY     Status: Abnormal   Collection Time    06/27/14  4:10 AM      Result Value Ref Range   Glucose-Capillary 160 (*) 70 - 99 mg/dL  BASIC METABOLIC PANEL     Status: Abnormal   Collection Time    06/27/14  5:16 AM      Result Value Ref Range   Sodium 140  137 - 147 mEq/L   Potassium 3.4 (*) 3.7 - 5.3 mEq/L   Chloride 104  96 - 112 mEq/L   CO2 26  19 - 32 mEq/L   Glucose, Bld 150 (*) 70 - 99 mg/dL   BUN 11  6 - 23 mg/dL   Creatinine, Ser 0.78  0.50 - 1.35 mg/dL   Calcium 8.6  8.4 - 10.5 mg/dL   GFR calc non Af Amer 81 (*) >90 mL/min   GFR calc Af Amer >90  >90 mL/min   Comment: (NOTE)     The eGFR has been calculated using the CKD EPI equation.     This calculation has not been validated in all clinical situations.     eGFR's persistently <90 mL/min signify possible Chronic Kidney     Disease.   Anion gap 10  5 - 15  CBC     Status: Abnormal   Collection Time  06/27/14  5:16 AM      Result Value Ref Range   WBC 5.5  4.0 - 10.5 K/uL   RBC 3.11 (*) 4.22 - 5.81 MIL/uL   Hemoglobin 9.5 (*) 13.0 - 17.0 g/dL   HCT 29.0 (*) 39.0 - 52.0 %   MCV 93.2  78.0 - 100.0 fL   MCH 30.5  26.0 - 34.0 pg   MCHC 32.8  30.0 - 36.0 g/dL   RDW 14.4  11.5 - 15.5 %   Platelets 310  150 - 400 K/uL  SURGICAL PCR SCREEN     Status: None   Collection Time    06/27/14  5:43 AM      Result Value Ref Range   MRSA, PCR NEGATIVE  NEGATIVE   Staphylococcus aureus NEGATIVE  NEGATIVE   Comment:            The Xpert SA Assay (FDA     approved for NASAL specimens     in patients over 81 years of age),     is one component of     a comprehensive surveillance     program.  Test performance has     been validated by [employer] for  patients greater     than or equal to 47 year old.     It is not intended     to diagnose infection nor to     guide or monitor treatment.  GLUCOSE, CAPILLARY     Status: Abnormal   Collection Time    06/27/14  7:45 AM      Result Value Ref Range   Glucose-Capillary 150 (*) 70 - 99 mg/dL    Imaging / Studies: Ct Abdomen Pelvis W Contrast  06/26/2014   CLINICAL DATA:  Left inguinal pain. Progressively worsening. Left lower quadrant pain.  EXAM: CT ABDOMEN AND PELVIS WITH CONTRAST  TECHNIQUE: Multidetector CT imaging of the abdomen and pelvis was performed using the standard protocol following bolus administration of intravenous contrast.  CONTRAST:  42m OMNIPAQUE IOHEXOL 300 MG/ML SOLN, 1066mOMNIPAQUE IOHEXOL 300 MG/ML SOLN  COMPARISON:  10/27/2004 there is a large hiatal hernia, 10/27/2004  FINDINGS: There is a large hiatal hernia, stable since prior study. No confluent airspace opacities or effusions. Heart is mildly enlarged. Calcified granuloma noted in the right middle lobe.  Appendix is visualized and is normal. Small low-density lesions within the right hepatic lobe, most compatible with cysts. Spleen, pancreas, adrenals and left kidney are unremarkable. Nonobstructing stone in the lower pole of the right kidney measures 6-7 mm. No hydronephrosis.  Diffuse colonic diverticulosis. There is significant wall thickening within the sigmoid colon which is presumably related to muscular hypertrophy from diverticulosis. However, the degree of wall thickening is somewhat concerning and malignancy cannot be completely excluded. Small scattered adjacent pericolonic lymph nodes, none pathologically enlarged based on CT size. No inflammatory changes around the sigmoid colon or elsewhere to suggest active diverticulitis. Small bowel is decompressed.  There is a left inguinal hernia containing the left anterior corner of the bladder and fat. No bowel herniation within the inguinal hernia.  No free fluid or  free air. Aorta is heavily calcified. Non aneurysmal.  Degenerative changes in the lower lumbar and thoracic spine. No acute bony abnormality.  IMPRESSION: Diffuse colonic diverticulosis. Marked wall thickening within the sigmoid colon may represent changes from diverticulosis, but the degree of wall thickening is somewhat concerning. Cannot exclude malignancy. Recommend correlation with any recent colonoscopy  or repeat colonoscopy if the patient has not had one recently.  Left inguinal hernia containing a small portion of the bladder wall and fat.  Large hiatal hernia.  Right nephrolithiasis.   Electronically Signed   By: Rolm Baptise M.D.   On: 06/26/2014 16:43   Scheduled Meds: . amLODipine  10 mg Oral QPM  . enoxaparin (LOVENOX) injection  40 mg Subcutaneous Q24H  . hydrochlorothiazide  25 mg Oral q morning - 10a  . insulin aspart  0-15 Units Subcutaneous TID WC  . isosorbide mononitrate  60 mg Oral QPM  . metoprolol succinate  50 mg Oral Daily  . pantoprazole  40 mg Oral Daily  . potassium chloride  10 mEq Oral Daily  . simvastatin  20 mg Oral QPM   Continuous Infusions: . dextrose 5 % and 0.9% NaCl 100 mL/hr at 06/26/14 1658   PRN Meds:.acetaminophen, morphine injection, nitroGLYCERIN, ondansetron   Antibiotics: Anti-infectives   None      Assessment/Plan Left bladder containing inguinal hernia  -await cardiology clearance. Will proceed with hernia repair pending cardiac recommendations.   -NPO  -IVF  -pain control  -mobilize  -SCD/lovenox  Hypertension-c/w home meds  Diabetes mellitus-hold metformin, SSI, CBGs  CAD-c/w home meds, cardiology consult  Colonic diverticulosis with marked thickening-colonoscopy in 2014.  Outpatient follow up with Dr. Burt Knack, [facility] Pager [phone] Office [phone]  06/27/2014 8:50 AM

## 2014-06-27 NOTE — Consult Note (Signed)
CARDIOLOGY CONSULT NOTE       Patient ID: Kenneth [name] MRN: [id number] DOB/AGE: Nov 04, 1928 80 y.o.  Admit date: 06/26/2014 Referring Physician:  [facility] Primary Physician: Irven Shelling, MD Primary Cardiologist:  [facility] Reason for Consultation:  Preop [place]   Active Problems:   Left inguinal hernia   HPI:   80 yo with distant CABG.  Normal EF  Cath 2013 with patient grafts but occluded LM with proximal and mid LAD territory supplied by collaterals.  No revascularization options.  Patient has had stable exertional angina For over 3 years.  He can do heavy work or walking for 30-40 minuts then gets angina with burning in chest.  No rest pain an no change in pattern recently.  Seen by Dr Tamala Julian 09/28/13  And stable with no change in medication Patient is very impatient this am  Wants to have surgery now or go home  He has significant left inguinal hernia with weight loss, and difficulty eating the last 2-3 months  Hernia now moderately tender.  No history of arrhythmia, syncope , palpitations or CHF.  Has had previous post op nausea and vomiting   ROS All other systems reviewed and negative except as noted above  Past Medical History  Diagnosis Date  . Diabetes   . HTN (hypertension)   . Coronary atherosclerosis of unspecified type of vessel, native or graft     CABG 1993, LIMA to LAD, SVG to obtuse marginal one, SVG to posterior descending, stable angina  . Hyperlipidemia `  . GERD (gastroesophageal reflux disease)   . Nephrolithiasis   . Chronic rhinitis   . Glaucoma   . Melanoma of skin, site unspecified   . Hernia     left side  . Complication of anesthesia   . PONV (postoperative nausea and vomiting)   . Anemia     on iron  . [facility] spotted fever 2-3 yrs ago  . CAD (coronary artery disease)     Family History  Problem Relation Age of Onset  . CAD Father   . Hyperlipidemia Father   . Cancer Mother     History   Social History  .  Marital Status: Married    Spouse Name: N/A    Number of Children: N/A  . Years of Education: N/A   Occupational History  . Not on file.   Social History Main Topics  . Smoking status: Light Tobacco Smoker    Types: Cigars  . Smokeless tobacco: Never Used  . Alcohol Use: No  . Drug Use: No  . Sexual Activity: Not on file   Other Topics Concern  . Not on file   Social History Narrative  . No narrative on file    Past Surgical History  Procedure Laterality Date  . Right leg fracture age 74    . Hernia repair  1970's  . Coronary artery bypass graft  1990's    x 3  . Tonsillectomy  age 49 or 63  . Colonoscopy with propofol N/A 11/27/2013    Procedure: COLONOSCOPY WITH PROPOFOL;  Surgeon: Garlan Fair, MD;  Location: [facility];  Service: Endoscopy;  Laterality: N/A;     . enoxaparin (LOVENOX) injection  40 mg Subcutaneous Q24H  . insulin aspart  0-15 Units Subcutaneous TID WC  . isosorbide dinitrate  20 mg Oral TID  . pantoprazole  40 mg Oral Daily  . potassium chloride  10 mEq Oral Daily  . propranolol  20 mg  Oral TID  . simvastatin  20 mg Oral QPM   . dextrose 5 % and 0.9% NaCl 100 mL/hr at 06/26/14 1658    Physical Exam: Blood pressure 112/56, pulse 55, temperature 98 F (36.7 C), temperature source Oral, resp. rate 18, height 5\' 11"  (1.803 m), weight 167 lb (75.751 kg), SpO2 97.00%.    Affect appropriate Healthy:  appears stated age 81: normal Neck supple with no adenopathy JVP normal no bruits no thyromegaly Lungs clear with no wheezing and good diaphragmatic motion Heart:  S1/S2 AV sclerosis  murmur, no rub, gallop or click PMI normal Abdomen: benighn, BS positve, no tenderness, no AAA  Tender left inguinal hernia  no bruit.  No HSM or HJR Distal pulses intact with no bruits No edema Neuro non-focal Skin warm and dry No muscular weakness   Labs:   Lab Results  Component Value Date   WBC 5.5 06/27/2014   HGB 9.5* 06/27/2014   HCT 29.0*  06/27/2014   MCV 93.2 06/27/2014   PLT 310 06/27/2014    Recent Labs Lab 06/26/14 1429 06/27/14 0516  NA 141 140  K 3.9 3.4*  CL 103 104  CO2 27 26  BUN 20 11  CREATININE 0.79 0.78  CALCIUM 9.6 8.6  PROT 7.3  --   BILITOT 0.4  --   ALKPHOS 76  --   ALT 12  --   AST 17  --   GLUCOSE 126* 150*   No results found for this basename: CKTOTAL, CKMB, CKMBINDEX, TROPONINI    No results found for this basename: CHOL   No results found for this basename: HDL   No results found for this basename: LDLCALC   No results found for this basename: TRIG   No results found for this basename: CHOLHDL   No results found for this basename: LDLDIRECT      Radiology: Ct Abdomen Pelvis W Contrast  06/26/2014   CLINICAL DATA:  Left inguinal pain. Progressively worsening. Left lower quadrant pain.  EXAM: CT ABDOMEN AND PELVIS WITH CONTRAST  TECHNIQUE: Multidetector CT imaging of the abdomen and pelvis was performed using the standard protocol following bolus administration of intravenous contrast.  CONTRAST:  47mL OMNIPAQUE IOHEXOL 300 MG/ML SOLN, 115mL OMNIPAQUE IOHEXOL 300 MG/ML SOLN  COMPARISON:  10/27/2004 there is a large hiatal hernia, 10/27/2004  FINDINGS: There is a large hiatal hernia, stable since prior study. No confluent airspace opacities or effusions. Heart is mildly enlarged. Calcified granuloma noted in the right middle lobe.  Appendix is visualized and is normal. Small low-density lesions within the right hepatic lobe, most compatible with cysts. Spleen, pancreas, adrenals and left kidney are unremarkable. Nonobstructing stone in the lower pole of the right kidney measures 6-7 mm. No hydronephrosis.  Diffuse colonic diverticulosis. There is significant wall thickening within the sigmoid colon which is presumably related to muscular hypertrophy from diverticulosis. However, the degree of wall thickening is somewhat concerning and malignancy cannot be completely excluded. Small scattered  adjacent pericolonic lymph nodes, none pathologically enlarged based on CT size. No inflammatory changes around the sigmoid colon or elsewhere to suggest active diverticulitis. Small bowel is decompressed.  There is a left inguinal hernia containing the left anterior corner of the bladder and fat. No bowel herniation within the inguinal hernia.  No free fluid or free air. Aorta is heavily calcified. Non aneurysmal.  Degenerative changes in the lower lumbar and thoracic spine. No acute bony abnormality.  IMPRESSION: Diffuse colonic diverticulosis. Marked wall thickening within  the sigmoid colon may represent changes from diverticulosis, but the degree of wall thickening is somewhat concerning. Cannot exclude malignancy. Recommend correlation with any recent colonoscopy or repeat colonoscopy if the patient has not had one recently.  Left inguinal hernia containing a small portion of the bladder wall and fat.  Large hiatal hernia.  Right nephrolithiasis.   Electronically Signed   By: Rolm Baptise M.D.   On: 06/26/2014 16:43    EKG:  SR RBBB no acute changes    ASSESSMENT AND PLAN:  CAD:  Preop  Moderate risk with known CAD, Angina and age.  However his anginal pattern is stable and with quite a bit of exertion.  There are no revascularization options and he has collaterals.  He has not had any CHF or arrhythmias.  Myovue will not be helpful as the collaterals will show positive in scan.  Patient wants to proceed with surgery and understands there is some risk of periop MI  Disucssed with wife as well  He does ot want to continue to have difficulty eating and pain.  Clear to have repair  Note he has had post op nausea and vomiting Would need to Rx agressively with phenergan or zofran to prevent angina I have changed his beta blocker and nitrates to shorter acting agents for more uniform coverage.  Post op would send to unit that can use iv nitro or iv esmolol incase there is any angina, HTN or tachycardia that  needs aggressive Rx.  Also noted he is not on aspirin  Post op would consider starting short term plavix to mitigate hypercoagulable state of surgery.  Keep Hct above 28  Chol:  Continue statin  DM:  SS insulin coverage   Signed: Jenkins Rouge 06/27/2014, 11:20 AM

## 2014-06-28 ENCOUNTER — Encounter (HOSPITAL_COMMUNITY): Admission: EM | Disposition: A | Discharge status: Home / Self Care | Payer: Self-pay | Source: Home / Self Care

## 2014-06-28 ENCOUNTER — Observation Stay (HOSPITAL_COMMUNITY): Payer: Medicare Other | Admitting: Anesthesiology

## 2014-06-28 ENCOUNTER — Encounter (HOSPITAL_COMMUNITY): Payer: Self-pay | Admitting: Anesthesiology

## 2014-06-28 ENCOUNTER — Encounter (HOSPITAL_COMMUNITY): Payer: Medicare Other | Admitting: Anesthesiology

## 2014-06-28 ENCOUNTER — Telehealth: Payer: Self-pay | Admitting: Interventional Cardiology

## 2014-06-28 DIAGNOSIS — E7800 Pure hypercholesterolemia, unspecified: Secondary | ICD-10-CM | POA: Diagnosis present

## 2014-06-28 DIAGNOSIS — Z880 Allergy status to penicillin: Secondary | ICD-10-CM | POA: Diagnosis not present

## 2014-06-28 DIAGNOSIS — R61 Generalized hyperhidrosis: Secondary | ICD-10-CM | POA: Diagnosis not present

## 2014-06-28 DIAGNOSIS — R1032 Left lower quadrant pain: Secondary | ICD-10-CM | POA: Diagnosis present

## 2014-06-28 DIAGNOSIS — I209 Angina pectoris, unspecified: Secondary | ICD-10-CM | POA: Diagnosis present

## 2014-06-28 DIAGNOSIS — I2510 Atherosclerotic heart disease of native coronary artery without angina pectoris: Secondary | ICD-10-CM | POA: Diagnosis present

## 2014-06-28 DIAGNOSIS — I10 Essential (primary) hypertension: Secondary | ICD-10-CM | POA: Diagnosis present

## 2014-06-28 DIAGNOSIS — F17200 Nicotine dependence, unspecified, uncomplicated: Secondary | ICD-10-CM | POA: Diagnosis present

## 2014-06-28 DIAGNOSIS — Z951 Presence of aortocoronary bypass graft: Secondary | ICD-10-CM | POA: Diagnosis not present

## 2014-06-28 DIAGNOSIS — K219 Gastro-esophageal reflux disease without esophagitis: Secondary | ICD-10-CM | POA: Diagnosis present

## 2014-06-28 DIAGNOSIS — Z79899 Other long term (current) drug therapy: Secondary | ICD-10-CM | POA: Diagnosis not present

## 2014-06-28 DIAGNOSIS — Z87442 Personal history of urinary calculi: Secondary | ICD-10-CM | POA: Diagnosis not present

## 2014-06-28 DIAGNOSIS — H409 Unspecified glaucoma: Secondary | ICD-10-CM | POA: Diagnosis present

## 2014-06-28 DIAGNOSIS — Z85820 Personal history of malignant melanoma of skin: Secondary | ICD-10-CM | POA: Diagnosis not present

## 2014-06-28 DIAGNOSIS — I498 Other specified cardiac arrhythmias: Secondary | ICD-10-CM | POA: Diagnosis not present

## 2014-06-28 DIAGNOSIS — Z886 Allergy status to analgesic agent status: Secondary | ICD-10-CM | POA: Diagnosis not present

## 2014-06-28 DIAGNOSIS — R350 Frequency of micturition: Secondary | ICD-10-CM | POA: Diagnosis present

## 2014-06-28 DIAGNOSIS — K4090 Unilateral inguinal hernia, without obstruction or gangrene, not specified as recurrent: Secondary | ICD-10-CM

## 2014-06-28 DIAGNOSIS — Z888 Allergy status to other drugs, medicaments and biological substances status: Secondary | ICD-10-CM | POA: Diagnosis not present

## 2014-06-28 DIAGNOSIS — E119 Type 2 diabetes mellitus without complications: Secondary | ICD-10-CM | POA: Diagnosis present

## 2014-06-28 DIAGNOSIS — K4091 Unilateral inguinal hernia, without obstruction or gangrene, recurrent: Secondary | ICD-10-CM | POA: Diagnosis present

## 2014-06-28 DIAGNOSIS — E785 Hyperlipidemia, unspecified: Secondary | ICD-10-CM | POA: Diagnosis present

## 2014-06-28 DIAGNOSIS — Z8249 Family history of ischemic heart disease and other diseases of the circulatory system: Secondary | ICD-10-CM | POA: Diagnosis not present

## 2014-06-28 DIAGNOSIS — J310 Chronic rhinitis: Secondary | ICD-10-CM | POA: Diagnosis present

## 2014-06-28 HISTORY — PX: INSERTION OF MESH: SHX5868

## 2014-06-28 HISTORY — PX: INGUINAL HERNIA REPAIR: SHX194

## 2014-06-28 LAB — GLUCOSE, CAPILLARY
GLUCOSE-CAPILLARY: 146 mg/dL — AB (ref 70–99)
GLUCOSE-CAPILLARY: 143 mg/dL — AB (ref 70–99)
Glucose-Capillary: 129 mg/dL — ABNORMAL HIGH (ref 70–99)
Glucose-Capillary: 172 mg/dL — ABNORMAL HIGH (ref 70–99)
Glucose-Capillary: 153 mg/dL — ABNORMAL HIGH (ref 70–99)

## 2014-06-28 LAB — CBC
HEMATOCRIT: 29.9 % — AB (ref 39.0–52.0)
Hemoglobin: 9.9 g/dL — ABNORMAL LOW (ref 13.0–17.0)
MCH: 30.8 pg (ref 26.0–34.0)
MCHC: 33.1 g/dL (ref 30.0–36.0)
MCV: 93.1 fL (ref 78.0–100.0)
PLATELETS: 323 10*3/uL (ref 150–400)
RBC: 3.21 MIL/uL — ABNORMAL LOW (ref 4.22–5.81)
RDW: 14.3 % (ref 11.5–15.5)
WBC: 9.5 10*3/uL (ref 4.0–10.5)

## 2014-06-28 SURGERY — REPAIR, HERNIA, INGUINAL, ADULT
Anesthesia: General | Site: Groin | Laterality: Left

## 2014-06-28 MED ORDER — PROPOFOL 10 MG/ML IV BOLUS
INTRAVENOUS | Status: DC | PRN
  Administered 2014-06-28: 160 mg via INTRAVENOUS

## 2014-06-28 MED ORDER — FENTANYL CITRATE 0.05 MG/ML IJ SOLN
INTRAMUSCULAR | Status: DC | PRN
  Administered 2014-06-28 (×4): 50 ug via INTRAVENOUS

## 2014-06-28 MED ORDER — ROCURONIUM BROMIDE 100 MG/10ML IV SOLN
INTRAVENOUS | Status: AC
  Filled 2014-06-28: qty 1

## 2014-06-28 MED ORDER — OXYCODONE-ACETAMINOPHEN 5-325 MG PO TABS
1.0000 | ORAL_TABLET | ORAL | Status: DC | PRN
  Administered 2014-06-28 – 2014-06-29 (×4): 1 via ORAL
  Filled 2014-06-28 (×4): qty 1

## 2014-06-28 MED ORDER — LACTATED RINGERS IV SOLN: INTRAVENOUS | Status: DC

## 2014-06-28 MED ORDER — GLYCOPYRROLATE 0.2 MG/ML IJ SOLN
INTRAMUSCULAR | Status: AC
  Filled 2014-06-28: qty 3

## 2014-06-28 MED ORDER — LIDOCAINE HCL (CARDIAC) 20 MG/ML IV SOLN
INTRAVENOUS | Status: AC
  Filled 2014-06-28: qty 5

## 2014-06-28 MED ORDER — FENTANYL CITRATE 0.05 MG/ML IJ SOLN
INTRAMUSCULAR | Status: AC
  Filled 2014-06-28: qty 2

## 2014-06-28 MED ORDER — NITROGLYCERIN 0.4 MG SL SUBL: 0.4000 mg | SUBLINGUAL_TABLET | SUBLINGUAL | Status: DC | PRN

## 2014-06-28 MED ORDER — SUCCINYLCHOLINE CHLORIDE 20 MG/ML IJ SOLN
INTRAMUSCULAR | Status: DC | PRN
  Administered 2014-06-28: 100 mg via INTRAVENOUS

## 2014-06-28 MED ORDER — ONDANSETRON HCL 4 MG/2ML IJ SOLN
INTRAMUSCULAR | Status: DC | PRN
Start: 2014-06-28 — End: 2014-06-28
  Administered 2014-06-28: 4 mg via INTRAVENOUS

## 2014-06-28 MED ORDER — 0.9 % SODIUM CHLORIDE (POUR BTL) OPTIME
TOPICAL | Status: DC | PRN
  Administered 2014-06-28: 1000 mL

## 2014-06-28 MED ORDER — LACTATED RINGERS IV SOLN
INTRAVENOUS | Status: DC | PRN
  Administered 2014-06-28 (×2): via INTRAVENOUS

## 2014-06-28 MED ORDER — MENTHOL 3 MG MT LOZG
1.0000 | LOZENGE | OROMUCOSAL | Status: DC | PRN
  Administered 2014-06-28: 3 mg via ORAL
  Filled 2014-06-28: qty 9

## 2014-06-28 MED ORDER — OXYCODONE-ACETAMINOPHEN 5-325 MG PO TABS: 1.0000 | ORAL_TABLET | ORAL | Status: DC | PRN

## 2014-06-28 MED ORDER — PROMETHAZINE HCL 25 MG/ML IJ SOLN: 6.2500 mg | INTRAMUSCULAR | Status: DC | PRN

## 2014-06-28 MED ORDER — SIMVASTATIN 20 MG PO TABS
20.0000 mg | ORAL_TABLET | Freq: Every evening | ORAL | Status: DC
  Administered 2014-06-29: 20 mg via ORAL
  Filled 2014-06-28 (×3): qty 1

## 2014-06-28 MED ORDER — PANTOPRAZOLE SODIUM 40 MG PO TBEC
40.0000 mg | DELAYED_RELEASE_TABLET | Freq: Every day | ORAL | Status: DC
  Administered 2014-06-29 – 2014-06-30 (×2): 40 mg via ORAL
  Filled 2014-06-28 (×2): qty 1

## 2014-06-28 MED ORDER — ENOXAPARIN SODIUM 40 MG/0.4ML ~~LOC~~ SOLN
40.0000 mg | SUBCUTANEOUS | Status: DC
  Administered 2014-06-29 – 2014-06-30 (×2): 40 mg via SUBCUTANEOUS
  Filled 2014-06-28 (×3): qty 0.4

## 2014-06-28 MED ORDER — BUPIVACAINE-EPINEPHRINE (PF) 0.25% -1:200000 IJ SOLN
INTRAMUSCULAR | Status: AC
  Filled 2014-06-28: qty 30

## 2014-06-28 MED ORDER — CEFOTETAN DISODIUM-DEXTROSE 2-2.08 GM-% IV SOLR
INTRAVENOUS | Status: AC
  Filled 2014-06-28: qty 50

## 2014-06-28 MED ORDER — ISOSORBIDE DINITRATE 20 MG PO TABS
20.0000 mg | ORAL_TABLET | Freq: Three times a day (TID) | ORAL | Status: DC
  Administered 2014-06-28 (×2): 20 mg via ORAL
  Filled 2014-06-28 (×5): qty 1

## 2014-06-28 MED ORDER — ROCURONIUM BROMIDE 100 MG/10ML IV SOLN
INTRAVENOUS | Status: DC | PRN
  Administered 2014-06-28: 10 mg via INTRAVENOUS
  Administered 2014-06-28: 5 mg via INTRAVENOUS
  Administered 2014-06-28: 25 mg via INTRAVENOUS

## 2014-06-28 MED ORDER — POTASSIUM CHLORIDE CRYS ER 10 MEQ PO TBCR
10.0000 meq | EXTENDED_RELEASE_TABLET | Freq: Every day | ORAL | Status: DC
  Administered 2014-06-29 – 2014-06-30 (×2): 10 meq via ORAL
  Filled 2014-06-28 (×3): qty 1

## 2014-06-28 MED ORDER — MORPHINE SULFATE 2 MG/ML IJ SOLN: 2.0000 mg | INTRAMUSCULAR | Status: DC | PRN

## 2014-06-28 MED ORDER — FENTANYL CITRATE 0.05 MG/ML IJ SOLN
25.0000 ug | INTRAMUSCULAR | Status: DC | PRN
Start: 2014-06-28 — End: 2014-06-28
  Administered 2014-06-28: 50 ug via INTRAVENOUS

## 2014-06-28 MED ORDER — GLYCOPYRROLATE 0.2 MG/ML IJ SOLN
INTRAMUSCULAR | Status: DC | PRN
  Administered 2014-06-28: .6 mg via INTRAVENOUS

## 2014-06-28 MED ORDER — PROPOFOL 10 MG/ML IV BOLUS
INTRAVENOUS | Status: AC
  Filled 2014-06-28: qty 20

## 2014-06-28 MED ORDER — ACETAMINOPHEN 325 MG PO TABS
325.0000 mg | ORAL_TABLET | Freq: Four times a day (QID) | ORAL | Status: DC | PRN
Start: 2014-06-28 — End: 2014-06-30

## 2014-06-28 MED ORDER — PROPRANOLOL HCL 20 MG PO TABS
20.0000 mg | ORAL_TABLET | Freq: Three times a day (TID) | ORAL | Status: DC
  Filled 2014-06-28 (×5): qty 1

## 2014-06-28 MED ORDER — INSULIN ASPART 100 UNIT/ML ~~LOC~~ SOLN
0.0000 [IU] | Freq: Three times a day (TID) | SUBCUTANEOUS | Status: DC
  Administered 2014-06-29 (×2): 1 [IU] via SUBCUTANEOUS
  Administered 2014-06-29: 2 [IU] via SUBCUTANEOUS
  Administered 2014-06-30: 1 [IU] via SUBCUTANEOUS

## 2014-06-28 MED ORDER — BIOTENE DRY MOUTH MT LIQD
15.0000 mL | Freq: Two times a day (BID) | OROMUCOSAL | Status: DC
Start: 2014-06-28 — End: 2014-06-30
  Administered 2014-06-28 – 2014-06-30 (×4): 15 mL via OROMUCOSAL

## 2014-06-28 MED ORDER — LIDOCAINE HCL 1 % IJ SOLN
INTRAMUSCULAR | Status: AC
  Filled 2014-06-28: qty 20

## 2014-06-28 MED ORDER — LIDOCAINE HCL (CARDIAC) 20 MG/ML IV SOLN
INTRAVENOUS | Status: DC | PRN
  Administered 2014-06-28: 50 mg via INTRAVENOUS

## 2014-06-28 MED ORDER — ONDANSETRON HCL 4 MG/2ML IJ SOLN
INTRAMUSCULAR | Status: AC
  Filled 2014-06-28: qty 2

## 2014-06-28 MED ORDER — ONDANSETRON HCL 4 MG/2ML IJ SOLN: 4.0000 mg | Freq: Four times a day (QID) | INTRAMUSCULAR | Status: DC | PRN

## 2014-06-28 MED ORDER — PANTOPRAZOLE SODIUM 40 MG PO TBEC: 40.0000 mg | DELAYED_RELEASE_TABLET | Freq: Every day | ORAL | Status: DC

## 2014-06-28 MED ORDER — CHLORHEXIDINE GLUCONATE 0.12 % MT SOLN
15.0000 mL | Freq: Two times a day (BID) | OROMUCOSAL | Status: DC
  Administered 2014-06-29 – 2014-06-30 (×3): 15 mL via OROMUCOSAL
  Filled 2014-06-28 (×5): qty 15

## 2014-06-28 MED ORDER — NEOSTIGMINE METHYLSULFATE 10 MG/10ML IV SOLN
INTRAVENOUS | Status: DC | PRN
  Administered 2014-06-28: 4 mg via INTRAVENOUS

## 2014-06-28 MED ORDER — AMLODIPINE BESYLATE 5 MG PO TABS
5.0000 mg | ORAL_TABLET | Freq: Every day | ORAL | Status: DC
  Administered 2014-06-28: 5 mg via ORAL
  Filled 2014-06-28: qty 1

## 2014-06-28 MED ORDER — LIDOCAINE HCL (PF) 1 % IJ SOLN
INTRAMUSCULAR | Status: DC | PRN
  Administered 2014-06-28: 9 mL

## 2014-06-28 MED ORDER — BUPIVACAINE-EPINEPHRINE 0.25% -1:200000 IJ SOLN
INTRAMUSCULAR | Status: DC | PRN
  Administered 2014-06-28: 9 mL

## 2014-06-28 SURGICAL SUPPLY — 45 items
ADH SKN CLS APL DERMABOND .7 (GAUZE/BANDAGES/DRESSINGS)
APL SKNCLS STERI-STRIP NONHPOA (GAUZE/BANDAGES/DRESSINGS)
BENZOIN TINCTURE PRP APPL 2/3 (GAUZE/BANDAGES/DRESSINGS) ×1 IMPLANT
BLADE HEX COATED 2.75 (ELECTRODE) ×3 IMPLANT
BLADE SURG 15 STRL LF DISP TIS (BLADE) ×1 IMPLANT
BLADE SURG 15 STRL SS (BLADE) ×3
CANISTER SUCTION 2500CC (MISCELLANEOUS) ×1 IMPLANT
CLOSURE WOUND 1/2 X4 (GAUZE/BANDAGES/DRESSINGS)
DECANTER SPIKE VIAL GLASS SM (MISCELLANEOUS) ×3 IMPLANT
DERMABOND ADVANCED (GAUZE/BANDAGES/DRESSINGS)
DERMABOND ADVANCED .7 DNX12 (GAUZE/BANDAGES/DRESSINGS) IMPLANT
DRAIN PENROSE 18X1/2 LTX STRL (DRAIN) ×3 IMPLANT
DRAPE LAPAROSCOPIC ABDOMINAL (DRAPES) ×3 IMPLANT
ELECT REM PT RETURN 9FT ADLT (ELECTROSURGICAL) ×3
ELECTRODE REM PT RTRN 9FT ADLT (ELECTROSURGICAL) ×1 IMPLANT
GAUZE SPONGE 4X4 12PLY STRL (GAUZE/BANDAGES/DRESSINGS) ×3 IMPLANT
GLOVE BIO SURGEON STRL SZ7.5 (GLOVE) ×6 IMPLANT
GLOVE BIOGEL PI IND STRL 7.0 (GLOVE) ×1 IMPLANT
GLOVE BIOGEL PI INDICATOR 7.0 (GLOVE) ×2
GOWN STRL REUS W/ TWL XL LVL3 (GOWN DISPOSABLE) ×1 IMPLANT
GOWN STRL REUS W/TWL LRG LVL3 (GOWN DISPOSABLE) ×3 IMPLANT
GOWN STRL REUS W/TWL XL LVL3 (GOWN DISPOSABLE) ×6 IMPLANT
KIT BASIN OR (CUSTOM PROCEDURE TRAY) ×3 IMPLANT
MESH ULTRAPRO 3X6 7.6X15CM (Mesh General) ×2 IMPLANT
MESH VENTRALEX ST 1-7/10 CRC S (Mesh General) ×2 IMPLANT
NDL HYPO 25X1 1.5 SAFETY (NEEDLE) ×1 IMPLANT
NEEDLE HYPO 25X1 1.5 SAFETY (NEEDLE) ×3 IMPLANT
NS IRRIG 1000ML POUR BTL (IV SOLUTION) ×1 IMPLANT
PACK BASIC VI WITH GOWN DISP (CUSTOM PROCEDURE TRAY) ×3 IMPLANT
PENCIL BUTTON HOLSTER BLD 10FT (ELECTRODE) ×3 IMPLANT
SPONGE LAP 18X18 X RAY DECT (DISPOSABLE) ×3 IMPLANT
STRIP CLOSURE SKIN 1/2X4 (GAUZE/BANDAGES/DRESSINGS) ×1 IMPLANT
SUT MNCRL AB 4-0 PS2 18 (SUTURE) ×3 IMPLANT
SUT PROLENE 2 0 SH DA (SUTURE) ×10 IMPLANT
SUT SILK 2 0 SH (SUTURE) IMPLANT
SUT SILK 3 0 (SUTURE)
SUT SILK 3-0 18XBRD TIE 12 (SUTURE) IMPLANT
SUT VIC AB 2-0 SH 27 (SUTURE) ×6
SUT VIC AB 2-0 SH 27X BRD (SUTURE) ×2 IMPLANT
SUT VIC AB 3-0 SH 27 (SUTURE) ×6
SUT VIC AB 3-0 SH 27XBRD (SUTURE) ×2 IMPLANT
SYR BULB IRRIGATION 50ML (SYRINGE) ×3 IMPLANT
SYR CONTROL 10ML LL (SYRINGE) ×3 IMPLANT
TOWEL OR 17X26 10 PK STRL BLUE (TOWEL DISPOSABLE) ×3 IMPLANT
YANKAUER SUCT BULB TIP 10FT TU (MISCELLANEOUS) ×3 IMPLANT

## 2014-06-28 NOTE — Transfer of Care (Signed)
Immediate Anesthesia Transfer of Care Note  Patient: Kenneth [name]  Procedure(s) Performed: Procedure(s): Left  INGUINAL HERNIA REPAIR  (Left) INSERTION OF MESH (Left)  Patient Location: PACU  Anesthesia Type:General  Level of Consciousness: awake, alert  and oriented  Airway & Oxygen Therapy: Patient Spontanous Breathing and Patient connected to face mask oxygen  Post-op Assessment: Report given to PACU RN and Post -op Vital signs reviewed and stable  Post vital signs: Reviewed and stable  Complications: No apparent anesthesia complications

## 2014-06-28 NOTE — Progress Notes (Signed)
SBP sustaining in the 200's. Paged cardiology. Awaiting callback.

## 2014-06-28 NOTE — Op Note (Signed)
06/26/2014 - 06/28/2014  10:57 AM  PATIENT:  Kenneth [name]  80 y.o. male  PRE-OPERATIVE DIAGNOSIS: recurrent left inguinal hernia  POST-OPERATIVE DIAGNOSIS: recurrent left inguinal hernia  PROCEDURE:  Procedure(s): Left  INGUINAL HERNIA REPAIR  (Left) INSERTION OF MESH (Left)  SURGEON:  Surgeon(s) and Role:    * Merrie Roof, MD - Primary  PHYSICIAN ASSISTANT:   ASSISTANTS: none   ANESTHESIA:   general  EBL:  Total I/O In: 1230 [I.V.:1230] Out: -   BLOOD ADMINISTERED:none  DRAINS: none   LOCAL MEDICATIONS USED:  MARCAINE     SPECIMEN:  No Specimen  DISPOSITION OF SPECIMEN:  N/A  COUNTS:  YES  TOURNIQUET:  * No tourniquets in log *  DICTATION: .Dragon Dictation After informed consent was obtained patient was brought to the operating room placed in the supine position on the operating table. After adequate induction of general anesthesia the patient's abdomen and left groin were prepped with ChloraPrep, allowed to dry, and draped in usual sterile manner. An incision was made from the edge of the pubic tubercle on the left towards the anterior superior iliac spine. This incision was carried through the skin and subcutaneous tissue sharply with the electrocautery until the fascia of the external oblique was encountered. A small bridging vein was clamped with hemostats divided and ligated with 3-0 silk ties. The External oblique fascia was opened along its fibers towards the apex of the external ring. There was a lot of scar tissue in this area from his previous hernia repair. We tried to separate the layers by combination of blunt hemostat dissection and some sharp dissection with the electrocautery. We did identify the hernia medially next to the pubic bone. This was bluntly dissected away from the rest of the cord structures and reduced. I then used a small umbilical hernia patch into place through the fascial defect to allow it to set up against the backside of the  transversalis layer. The floor of the canal was closed over this incorporating the anchors of the mesh. Next a 3 x 6 piece of altered from mesh was chosen and cut to fit the canal. The mesh was sewed inferiorly to the shelving edge of inguinal ligament with a running 2-0 Prolene stitch. Tails were cut in the mesh laterally and the tails were wrapped around the cord structures. Superiorly the mesh was sewed to the musculoaponeurotic strength of the transversalis with interrupted 2-0 Prolene vertical mattress stitches. Lateral to the cord the mesh tails were sewn to the shelving edge of the inguinal ligament with an interrupted 2-0 Prolene stitch. Once this was accomplished the hernia appeared to be well repaired and the mesh was in good position. The wound was irrigated with saline. The external oblique fascia was reapproximated with a running 2-0 Vicryl stitch. I did attempt to identify the ilioinguinal nerve but there was a tremendous amount of scar tissue and we did not ever see the nerve. The wound was then infiltrated with quarter percent Marcaine. The subcutaneous fascia was closed with a running 3-0 Vicryl stitch. The skin was then closed with a running 4-0 Monocryl subcuticular stitch. Dermabond dressings were applied. The patient tolerated the procedure well. At the end of the case all needle sponge and instrument counts were correct. The patient was then awakened and taken to recovery in stable condition.  PLAN OF CARE: Admit to inpatient   PATIENT DISPOSITION:  PACU - hemodynamically stable.   Delay start of Pharmacological VTE agent (>24hrs)  due to surgical blood loss or risk of bleeding: no

## 2014-06-28 NOTE — Telephone Encounter (Signed)
New message          Pt just came back from surgery and hr is in the mid upper 38s / nurse would like to know what she should do

## 2014-06-28 NOTE — Telephone Encounter (Signed)
returned [place] call. Adv her per Dr.Brackbill hold pt beta blocker (propanolol) if pt heartrate is under 60bpm. Sharyn Lull verbalized understanding.

## 2014-06-28 NOTE — Progress Notes (Signed)
Notified MD Brackbill's nurse regarding bradycardia 48-50. Awaiting next step. Will continue to monitor.

## 2014-06-28 NOTE — Progress Notes (Signed)
Paged by nurse for persistent hypertension. BP 176/65, recheck manually was 166/70. During this admission, Dr. Johnsie Cancel changed BB and nitrate to shorter acting. HR has prohibited BB at times - see prior notes. Will add back Norvasc at 5mg  daily (softer BPs AM of 7/23). We can always give him another 5mg  later this evening and increase to 10mg  daily if SBP still running high. Nursing will continue to monitor. Dayna Dunn PA-C

## 2014-06-28 NOTE — Anesthesia Preprocedure Evaluation (Addendum)
Anesthesia Evaluation  Patient identified by MRN, date of birth, ID band Patient awake  General Assessment Comment:Past Medical History   Diagnosis  Date   .  Diabetes     .  HTN (hypertension)     .  Coronary atherosclerosis of unspecified type of vessel, native or graft         CABG 1993, LIMA to LAD, SVG to obtuse marginal one, SVG to posterior descending, stable angina   .  Hyperlipidemia  `   .  GERD (gastroesophageal reflux disease)     .  Nephrolithiasis     .  Chronic rhinitis     .  Glaucoma     .  Melanoma of skin, site unspecified     .  Hernia         left side   .  Complication of anesthesia     .  PONV (postoperative nausea and vomiting)     .  Anemia         on iron   .  [facility] spotted fever  2-3 yrs ago     Reviewed: Allergy & Precautions, H&P , NPO status , Patient's Chart, lab work & pertinent test results  History of Anesthesia Complications (+) PONV and history of anesthetic complications  Airway Mallampati: II TM Distance: >3 FB Neck ROM: Full    Dental no notable dental hx.    Pulmonary Current Smoker,  Light smoker breath sounds clear to auscultation  Pulmonary exam normal       Cardiovascular Exercise Tolerance: Good hypertension, Pt. on medications and Pt. on home beta blockers + CAD negative cardio ROS  Rhythm:Regular Rate:Normal  Last cardiology office visit reviewed. Stable angina: Chest pain if he walks 300 yards.  ECG: iRBBB  Dr. Johnsie Cancel consult 06-27-14 reviewed.   Neuro/Psych negative neurological ROS  negative psych ROS   GI/Hepatic negative GI ROS, Neg liver ROS, GERD-  Medicated,  Endo/Other  diabetes, Type 2, Oral Hypoglycemic Agents  Renal/GU Renal disease  negative genitourinary   Musculoskeletal negative musculoskeletal ROS (+)   Abdominal   Peds negative pediatric ROS (+)  Hematology negative hematology ROS (+) anemia ,   Anesthesia Other Findings    Reproductive/Obstetrics negative OB ROS                         Anesthesia Physical Anesthesia Plan  ASA: IV  Anesthesia Plan: General   Post-op Pain Management:    Induction: Intravenous  Airway Management Planned: Oral ETT  Additional Equipment:   Intra-op Plan:   Post-operative Plan: Extubation in OR  Informed Consent: I have reviewed the patients History and Physical, chart, labs and discussed the procedure including the risks, benefits and alternatives for the proposed anesthesia with the patient or authorized representative who has indicated his/her understanding and acceptance.   Dental advisory given  Plan Discussed with: CRNA  Anesthesia Plan Comments: (At least moderate risk for cardiopulmonary event.)       Anesthesia Quick Evaluation

## 2014-06-28 NOTE — Anesthesia Postprocedure Evaluation (Signed)
  Anesthesia Post-op Note  Patient: Kenneth [name]  Procedure(s) Performed: Procedure(s) (LRB): Left  INGUINAL HERNIA REPAIR  (Left) INSERTION OF MESH (Left)  Patient Location: PACU  Anesthesia Type: General  Level of Consciousness: awake and alert   Airway and Oxygen Therapy: Patient Spontanous Breathing  Post-op Pain: mild  Post-op Assessment: Post-op Vital signs reviewed, Patient's Cardiovascular Status Stable, Respiratory Function Stable, Patent Airway and No signs of Nausea or vomiting  Last Vitals:  Filed Vitals:   06/28/14 1200  BP: 152/63  Pulse: 48  Temp:   Resp: 14    Post-op Vital Signs: stable   Complications: No apparent anesthesia complications

## 2014-06-28 NOTE — Progress Notes (Signed)
Patient Name: Kenneth [name] Date of Encounter: 06/28/2014     Active Problems:   Diabetes   HTN (hypertension)   Coronary atherosclerosis of unspecified type of vessel, native or graft   Left inguinal hernia   Preop cardiovascular exam    SUBJECTIVE  No chest pain or dyspnea overnight. Had diaphoresis from hypoglycemic  Episode.  CURRENT MEDS . cefoTEtan (CEFOTAN) IV  2 g Intravenous On Call to OR  . enoxaparin (LOVENOX) injection  40 mg Subcutaneous Q24H  . insulin aspart  0-9 Units Subcutaneous TID WC  . isosorbide dinitrate  20 mg Oral TID  . pantoprazole  40 mg Oral Daily  . potassium chloride  10 mEq Oral Daily  . propranolol  20 mg Oral TID  . simvastatin  20 mg Oral QPM    OBJECTIVE  Filed Vitals:   06/27/14 2123 06/28/14 0154 06/28/14 0553 06/28/14 0803  BP: 150/68 152/70 156/57 144/62  Pulse: 53 54 60 52  Temp: 97.4 F (36.3 C) 97.8 F (36.6 C) 99 F (37.2 C) 98.8 F (37.1 C)  TempSrc: Oral Oral Oral Oral  Resp: 18 18 18 18   Height:      Weight:      SpO2: 99% 98% 98% 97%    Intake/Output Summary (Last 24 hours) at 06/28/14 0824 Last data filed at 06/28/14 0981  Gross per 24 hour  Intake   3100 ml  Output   1600 ml  Net   1500 ml   Filed Weights   06/26/14 1719  Weight: 167 lb (75.751 kg)    PHYSICAL EXAM  General: Pleasant, NAD. Neuro: Alert and oriented X 3. Moves all extremities spontaneously. Psych: Normal affect. HEENT:  Normal  Neck: Supple without bruits or JVD. Lungs:  Resp regular and unlabored, CTA. Heart: RRR no s3, X9,JYNWG 2/6 systolic ejecti Abdomen: Soft, non-tender, non-distended, BS + x 4. LLQ hernia. Extremities: No clubbing, cyanosis or edema. DP/PT/Radials 2+ and equal bilaterally.  Accessory Clinical Findings  CBC  Recent Labs  06/26/14 1429 06/27/14 0516 06/28/14 0509  WBC 10.3 5.5 9.5  NEUTROABS 7.7  --   --   HGB 11.4* 9.5* 9.9*  HCT 35.0* 29.0* 29.9*  MCV 93.3 93.2 93.1  PLT 370 310 956    Basic Metabolic Panel  Recent Labs  06/26/14 1429 06/27/14 0516  NA 141 140  K 3.9 3.4*  CL 103 104  CO2 27 26  GLUCOSE 126* 150*  BUN 20 11  CREATININE 0.79 0.78  CALCIUM 9.6 8.6   Liver Function Tests  Recent Labs  06/26/14 1429  AST 17  ALT 12  ALKPHOS 76  BILITOT 0.4  PROT 7.3  ALBUMIN 3.7   No results found for this basename: LIPASE, AMYLASE,  in the last 72 hours Cardiac Enzymes No results found for this basename: CKTOTAL, CKMB, CKMBINDEX, TROPONINI,  in the last 72 hours BNP No components found with this basename: POCBNP,  D-Dimer No results found for this basename: DDIMER,  in the last 72 hours Hemoglobin A1C No results found for this basename: HGBA1C,  in the last 72 hours Fasting Lipid Panel No results found for this basename: CHOL, HDL, LDLCALC, TRIG, CHOLHDL, LDLDIRECT,  in the last 72 hours Thyroid Function Tests No results found for this basename: TSH, T4TOTAL, FREET3, T3FREE, THYROIDAB,  in the last 72 hours  TELE    ECG    Radiology/Studies  Ct Abdomen Pelvis W Contrast  06/26/2014   CLINICAL DATA:  Left  inguinal pain. Progressively worsening. Left lower quadrant pain.  EXAM: CT ABDOMEN AND PELVIS WITH CONTRAST  TECHNIQUE: Multidetector CT imaging of the abdomen and pelvis was performed using the standard protocol following bolus administration of intravenous contrast.  CONTRAST:  106mL OMNIPAQUE IOHEXOL 300 MG/ML SOLN, 175mL OMNIPAQUE IOHEXOL 300 MG/ML SOLN  COMPARISON:  10/27/2004 there is a large hiatal hernia, 10/27/2004  FINDINGS: There is a large hiatal hernia, stable since prior study. No confluent airspace opacities or effusions. Heart is mildly enlarged. Calcified granuloma noted in the right middle lobe.  Appendix is visualized and is normal. Small low-density lesions within the right hepatic lobe, most compatible with cysts. Spleen, pancreas, adrenals and left kidney are unremarkable. Nonobstructing stone in the lower pole of the right  kidney measures 6-7 mm. No hydronephrosis.  Diffuse colonic diverticulosis. There is significant wall thickening within the sigmoid colon which is presumably related to muscular hypertrophy from diverticulosis. However, the degree of wall thickening is somewhat concerning and malignancy cannot be completely excluded. Small scattered adjacent pericolonic lymph nodes, none pathologically enlarged based on CT size. No inflammatory changes around the sigmoid colon or elsewhere to suggest active diverticulitis. Small bowel is decompressed.  There is a left inguinal hernia containing the left anterior corner of the bladder and fat. No bowel herniation within the inguinal hernia.  No free fluid or free air. Aorta is heavily calcified. Non aneurysmal.  Degenerative changes in the lower lumbar and thoracic spine. No acute bony abnormality.  IMPRESSION: Diffuse colonic diverticulosis. Marked wall thickening within the sigmoid colon may represent changes from diverticulosis, but the degree of wall thickening is somewhat concerning. Cannot exclude malignancy. Recommend correlation with any recent colonoscopy or repeat colonoscopy if the patient has not had one recently.  Left inguinal hernia containing a small portion of the bladder wall and fat.  Large hiatal hernia.  Right nephrolithiasis.   Electronically Signed   By: Rolm Baptise M.D.   On: 06/26/2014 16:43    ASSESSMENT AND PLAN CAD: Preop Moderate risk with known CAD, Angina and age. However his anginal pattern is stable and with quite a bit of exertion. There are no revascularization options and he has collaterals. He has not had any CHF or arrhythmias. Myovue will not be helpful as the collaterals will show positive in scan. Patient wants to proceed with surgery and understands there is some risk of periop MI.Post op would send to unit that can use iv nitro or iv esmolol incase there is any angina, HTN or tachycardia that needs aggressive Rx. Also noted he is not on  aspirin Post op would consider starting short term plavix to mitigate hypercoagulable state of surgery. Keep Hct above 28  Chol: Continue statin  DM: SS insulin coverage     Signed, Darlin Coco MD

## 2014-06-29 LAB — GLUCOSE, CAPILLARY
Glucose-Capillary: 134 mg/dL — ABNORMAL HIGH (ref 70–99)
Glucose-Capillary: 213 mg/dL — ABNORMAL HIGH (ref 70–99)
Glucose-Capillary: 150 mg/dL — ABNORMAL HIGH (ref 70–99)

## 2014-06-29 MED ORDER — ISOSORBIDE MONONITRATE ER 60 MG PO TB24
60.0000 mg | ORAL_TABLET | Freq: Every day | ORAL | Status: DC
  Administered 2014-06-29 – 2014-06-30 (×2): 60 mg via ORAL
  Filled 2014-06-29 (×2): qty 1

## 2014-06-29 MED ORDER — AMLODIPINE BESYLATE 10 MG PO TABS
10.0000 mg | ORAL_TABLET | Freq: Every day | ORAL | Status: DC
  Administered 2014-06-29 – 2014-06-30 (×2): 10 mg via ORAL
  Filled 2014-06-29 (×2): qty 1

## 2014-06-29 MED ORDER — DEXTROSE-NACL 5-0.9 % IV SOLN
INTRAVENOUS | Status: DC
  Administered 2014-06-30: 05:00:00 via INTRAVENOUS

## 2014-06-29 MED ORDER — METOPROLOL SUCCINATE ER 25 MG PO TB24
25.0000 mg | ORAL_TABLET | Freq: Every day | ORAL | Status: DC
  Administered 2014-06-29 – 2014-06-30 (×2): 25 mg via ORAL
  Filled 2014-06-29 (×2): qty 1

## 2014-06-29 NOTE — Progress Notes (Signed)
Pt transferred from ICU.Patient in bed laying quietly.

## 2014-06-29 NOTE — Progress Notes (Addendum)
Patient Name: Kenneth [name] Date of Encounter: 06/29/2014     Active Problems:   Diabetes   HTN (hypertension)   Coronary atherosclerosis of unspecified type of vessel, native or graft   Left inguinal hernia   Preop cardiovascular exam    SUBJECTIVE  The patient states that he feels well this morning.  He is having minimal incisional discomfort.  No chest pain or shortness of breath.  Blood pressure has improved overnight since restarting amlodipine.  Rhythm has remained stable normal sinus rhythm with PACs  CURRENT MEDS . amLODipine  5 mg Oral Daily  . antiseptic oral rinse  15 mL Mouth Rinse q12n4p  . chlorhexidine  15 mL Mouth Rinse BID  . enoxaparin (LOVENOX) injection  40 mg Subcutaneous Q24H  . insulin aspart  0-9 Units Subcutaneous TID WC  . isosorbide dinitrate  20 mg Oral TID  . pantoprazole  40 mg Oral Daily  . potassium chloride  10 mEq Oral Daily  . propranolol  20 mg Oral TID  . simvastatin  20 mg Oral QPM    OBJECTIVE  Filed Vitals:   06/29/14 0300 06/29/14 0400 06/29/14 0500 06/29/14 0600  BP: 120/44 127/44 133/57 148/50  Pulse: 57 52 54 51  Temp:  98.4 F (36.9 C)    TempSrc:  Oral    Resp: 15 14 15 13   Height:      Weight:  166 lb 3.6 oz (75.4 kg)    SpO2: 97% 98% 98% 100%    Intake/Output Summary (Last 24 hours) at 06/29/14 0742 Last data filed at 06/29/14 0600  Gross per 24 hour  Intake   3870 ml  Output   1260 ml  Net   2610 ml   Filed Weights   06/26/14 1719 06/29/14 0400  Weight: 167 lb (75.751 kg) 166 lb 3.6 oz (75.4 kg)    PHYSICAL EXAM  General: Pleasant, NAD. Neuro: Alert and oriented X 3. Moves all extremities spontaneously. Psych: Normal affect. HEENT:  Normal  Neck: Supple without bruits or JVD. Lungs:  Resp regular and unlabored, CTA. Heart: RRR no s3, s4.   soft systolic murmur at left sternal edge. Abdomen: Soft, non-tender, non-distended, BS + x 4.  Incision looks good. Extremities: No clubbing, cyanosis or  edema. DP/PT/Radials 2+ and equal bilaterally.  Accessory Clinical Findings  CBC  Recent Labs  06/26/14 1429 06/27/14 0516 06/28/14 0509  WBC 10.3 5.5 9.5  NEUTROABS 7.7  --   --   HGB 11.4* 9.5* 9.9*  HCT 35.0* 29.0* 29.9*  MCV 93.3 93.2 93.1  PLT 370 310 258   Basic Metabolic Panel  Recent Labs  06/26/14 1429 06/27/14 0516  NA 141 140  K 3.9 3.4*  CL 103 104  CO2 27 26  GLUCOSE 126* 150*  BUN 20 11  CREATININE 0.79 0.78  CALCIUM 9.6 8.6   Liver Function Tests  Recent Labs  06/26/14 1429  AST 17  ALT 12  ALKPHOS 76  BILITOT 0.4  PROT 7.3  ALBUMIN 3.7   No results found for this basename: LIPASE, AMYLASE,  in the last 72 hours Cardiac Enzymes No results found for this basename: CKTOTAL, CKMB, CKMBINDEX, TROPONINI,  in the last 72 hours BNP No components found with this basename: POCBNP,  D-Dimer No results found for this basename: DDIMER,  in the last 72 hours Hemoglobin A1C No results found for this basename: HGBA1C,  in the last 72 hours Fasting Lipid Panel No results found for  this basename: CHOL, HDL, LDLCALC, TRIG, CHOLHDL, LDLDIRECT,  in the last 72 hours Thyroid Function Tests No results found for this basename: TSH, T4TOTAL, FREET3, T3FREE, THYROIDAB,  in the last 72 hours  TELE  Normal sinus rhythm with occasional PACs  ECG    Radiology/Studies  Ct Abdomen Pelvis W Contrast  06/26/2014   CLINICAL DATA:  Left inguinal pain. Progressively worsening. Left lower quadrant pain.  EXAM: CT ABDOMEN AND PELVIS WITH CONTRAST  TECHNIQUE: Multidetector CT imaging of the abdomen and pelvis was performed using the standard protocol following bolus administration of intravenous contrast.  CONTRAST:  63mL OMNIPAQUE IOHEXOL 300 MG/ML SOLN, 161mL OMNIPAQUE IOHEXOL 300 MG/ML SOLN  COMPARISON:  10/27/2004 there is a large hiatal hernia, 10/27/2004  FINDINGS: There is a large hiatal hernia, stable since prior study. No confluent airspace opacities or  effusions. Heart is mildly enlarged. Calcified granuloma noted in the right middle lobe.  Appendix is visualized and is normal. Small low-density lesions within the right hepatic lobe, most compatible with cysts. Spleen, pancreas, adrenals and left kidney are unremarkable. Nonobstructing stone in the lower pole of the right kidney measures 6-7 mm. No hydronephrosis.  Diffuse colonic diverticulosis. There is significant wall thickening within the sigmoid colon which is presumably related to muscular hypertrophy from diverticulosis. However, the degree of wall thickening is somewhat concerning and malignancy cannot be completely excluded. Small scattered adjacent pericolonic lymph nodes, none pathologically enlarged based on CT size. No inflammatory changes around the sigmoid colon or elsewhere to suggest active diverticulitis. Small bowel is decompressed.  There is a left inguinal hernia containing the left anterior corner of the bladder and fat. No bowel herniation within the inguinal hernia.  No free fluid or free air. Aorta is heavily calcified. Non aneurysmal.  Degenerative changes in the lower lumbar and thoracic spine. No acute bony abnormality.  IMPRESSION: Diffuse colonic diverticulosis. Marked wall thickening within the sigmoid colon may represent changes from diverticulosis, but the degree of wall thickening is somewhat concerning. Cannot exclude malignancy. Recommend correlation with any recent colonoscopy or repeat colonoscopy if the patient has not had one recently.  Left inguinal hernia containing a small portion of the bladder wall and fat.  Large hiatal hernia.  Right nephrolithiasis.   Electronically Signed   By: Rolm Baptise M.D.   On: 06/26/2014 16:43    ASSESSMENT AND PLAN 1. coronary artery disease.  No chest pain or angina. 2. essential hypertension 3. Hypercholesterolemia 4. diabetes mellitus  Plan: I will switch him back to metoprolol .  Will start at a low dose of Toprol-XL 25 given  his bradycardia yesterday.  Continue amlodipine.  Resume isosorbide mononitrate.    Increase activity as tolerated.  Hold off on restarting HCTZ yet.   Signed, Darlin Coco MD

## 2014-06-29 NOTE — Progress Notes (Signed)
Patient ID: Kenneth [name], male   DOB: 07/20/1928, 80 y.o.   MRN: [id number] 1 Day Post-Op  Subjective: Mild incisional discomfort. No chest pain or shortness of breath. No other complaints.  Objective: Vital signs in last 24 hours: Temp:  [97.9 F (36.6 C)-99.3 F (37.4 C)] 97.9 F (36.6 C) (07/25 0800) Pulse Rate:  [48-73] 57 (07/25 1000) Resp:  [13-21] 20 (07/25 1000) BP: (91-219)/(23-91) 183/64 mmHg (07/25 1000) SpO2:  [92 %-100 %] 97 % (07/25 1000) Weight:  [166 lb 3.6 oz (75.4 kg)] 166 lb 3.6 oz (75.4 kg) (07/25 0400) Last BM Date: 06/28/14  Intake/Output from previous day: 07/24 0701 - 07/25 0700 In: 3970 [P.O.:360; I.V.:3610] Out: 1460 [Urine:1460] Intake/Output this shift: Total I/O In: 300 [I.V.:300] Out: 250 [Urine:250]  General appearance: alert, cooperative and no distress GI: normal findings: soft, non-tender Incision/Wound: clean and dry with minimal swelling  Lab Results:   Recent Labs  06/27/14 0516 06/28/14 0509  WBC 5.5 9.5  HGB 9.5* 9.9*  HCT 29.0* 29.9*  PLT 310 323   BMET  Recent Labs  06/26/14 1429 06/27/14 0516  NA 141 140  K 3.9 3.4*  CL 103 104  CO2 27 26  GLUCOSE 126* 150*  BUN 20 11  CREATININE 0.79 0.78  CALCIUM 9.6 8.6     Studies/Results: No results found.  Anti-infectives: Anti-infectives   Start     Dose/Rate Route Frequency Ordered Stop   06/28/14 0600  cefoTEtan (CEFOTAN) 2 g in dextrose 5 % 50 mL IVPB    Comments:  Pharmacy may adjust dose strength for optimal dosing.   Send with patient on call to the OR.  Anesthesia to complete antibiotic administration <85min prior to incision per [facility].   2 g 100 mL/hr over 30 Minutes Intravenous On call to O.R. 06/27/14 1327 06/28/14 0923      Assessment/Plan: s/p Procedure(s): Left  INGUINAL HERNIA REPAIR  INSERTION OF MESH Doing well postoperatively without apparent complication. Coronary artery disease-stable-cardiology following Transfer to floor.  Increase activity as tolerated. Anticipate discharge tomorrow.   LOS: 3 days    Mylen Mangan T 06/29/2014

## 2014-06-30 LAB — GLUCOSE, CAPILLARY
GLUCOSE-CAPILLARY: 146 mg/dL — AB (ref 70–99)
GLUCOSE-CAPILLARY: 143 mg/dL — AB (ref 70–99)
GLUCOSE-CAPILLARY: 116 mg/dL — AB (ref 70–99)

## 2014-06-30 MED ORDER — HYDROCHLOROTHIAZIDE 12.5 MG PO CAPS
12.5000 mg | ORAL_CAPSULE | Freq: Every day | ORAL | Status: DC
  Administered 2014-06-30: 12.5 mg via ORAL
  Filled 2014-06-30: qty 1

## 2014-06-30 MED ORDER — HYDROCHLOROTHIAZIDE 12.5 MG PO CAPS: 12.5000 mg | ORAL_CAPSULE | Freq: Every day | ORAL | Status: DC

## 2014-06-30 MED ORDER — OXYCODONE-ACETAMINOPHEN 5-325 MG PO TABS: 1.0000 | ORAL_TABLET | ORAL | Status: DC | PRN

## 2014-06-30 NOTE — Progress Notes (Signed)
Patient ID: Kenneth [name], male   DOB: 1928/04/18, 80 y.o.   MRN: [id number] 2 Days Post-Op  Subjective: No C/O this AM.  Just sore. Getting up and down without difficulty  Objective: Vital signs in last 24 hours: Temp:  [98.2 F (36.8 C)-98.7 F (37.1 C)] 98.6 F (37 C) (07/26 0530) Pulse Rate:  [57-77] 58 (07/26 0530) Resp:  [17-20] 20 (07/26 0530) BP: (134-183)/(54-64) 156/58 mmHg (07/26 0530) SpO2:  [93 %-98 %] 96 % (07/26 0530) Last BM Date: 06/28/14  Intake/Output from previous day: 07/25 0701 - 07/26 0700 In: 1200 [I.V.:1200] Out: 600 [Urine:600] Intake/Output this shift:    General appearance: alert, cooperative and no distress Incision/Wound:Clean and dry without swelling  Lab Results:   Recent Labs  06/28/14 0509  WBC 9.5  HGB 9.9*  HCT 29.9*  PLT 323   BMET No results found for this basename: NA, K, CL, CO2, GLUCOSE, BUN, CREATININE, CALCIUM,  in the last 72 hours   Studies/Results: No results found.  Anti-infectives: Anti-infectives   Start     Dose/Rate Route Frequency Ordered Stop   06/28/14 0600  cefoTEtan (CEFOTAN) 2 g in dextrose 5 % 50 mL IVPB    Comments:  Pharmacy may adjust dose strength for optimal dosing.   Send with patient on call to the OR.  Anesthesia to complete antibiotic administration <73min prior to incision per [facility].   2 g 100 mL/hr over 30 Minutes Intravenous On call to O.R. 06/27/14 1327 06/28/14 0923      Assessment/Plan: s/p Procedure(s): Left  INGUINAL HERNIA REPAIR  INSERTION OF MESH Doing well without complications OK for discharge   LOS: 4 days    Jeremian Whitby T 06/30/2014

## 2014-06-30 NOTE — Progress Notes (Signed)
Patient Name: Kenneth [name] Date of Encounter: 06/30/2014     Active Problems:   Diabetes   HTN (hypertension)   Coronary atherosclerosis of unspecified type of vessel, native or graft   Left inguinal hernia   Preop cardiovascular exam    SUBJECTIVE  The patient is doing well.  No chest pain.  Rhythm stable normal sinus rhythm.  Tolerating increased activity.  CURRENT MEDS . amLODipine  10 mg Oral Daily  . antiseptic oral rinse  15 mL Mouth Rinse q12n4p  . chlorhexidine  15 mL Mouth Rinse BID  . enoxaparin (LOVENOX) injection  40 mg Subcutaneous Q24H  . insulin aspart  0-9 Units Subcutaneous TID WC  . isosorbide mononitrate  60 mg Oral Daily  . metoprolol succinate  25 mg Oral Daily  . pantoprazole  40 mg Oral Daily  . potassium chloride  10 mEq Oral Daily  . simvastatin  20 mg Oral QPM    OBJECTIVE  Filed Vitals:   06/29/14 1200 06/29/14 1549 06/29/14 2213 06/30/14 0530  BP: 164/54 134/59 142/64 156/58  Pulse: 61  77 58  Temp: 98.3 F (36.8 C) 98.2 F (36.8 C) 98.7 F (37.1 C) 98.6 F (37 C)  TempSrc: Oral Oral Oral Oral  Resp: 17 18 20 20   Height:    5\' 11"  (1.803 m)  Weight:      SpO2: 93% 98% 96% 96%    Intake/Output Summary (Last 24 hours) at 06/30/14 0824 Last data filed at 06/30/14 0700  Gross per 24 hour  Intake   1100 ml  Output    600 ml  Net    500 ml   Filed Weights   06/26/14 1719 06/29/14 0400  Weight: 167 lb (75.751 kg) 166 lb 3.6 oz (75.4 kg)    PHYSICAL EXAM  General: Pleasant, NAD. Neuro: Alert and oriented X 3. Moves all extremities spontaneously. Psych: Normal affect. HEENT:  Normal  Neck: Supple without bruits or JVD. Lungs:  Resp regular and unlabored, CTA. Heart: RRR no s3, s4, soft systolic murmur at left sternal edge. Abdomen: Soft, non-tender, non-distended, BS + x 4.  Extremities: No clubbing, cyanosis or edema. DP/PT/Radials 2+ and equal bilaterally.  Accessory Clinical Findings  CBC  Recent Labs   06/28/14 0509  WBC 9.5  HGB 9.9*  HCT 29.9*  MCV 93.1  PLT 122   Basic Metabolic Panel No results found for this basename: NA, K, CL, CO2, GLUCOSE, BUN, CREATININE, CALCIUM, MG, PHOS,  in the last 72 hours Liver Function Tests No results found for this basename: AST, ALT, ALKPHOS, BILITOT, PROT, ALBUMIN,  in the last 72 hours No results found for this basename: LIPASE, AMYLASE,  in the last 72 hours Cardiac Enzymes No results found for this basename: CKTOTAL, CKMB, CKMBINDEX, TROPONINI,  in the last 72 hours BNP No components found with this basename: POCBNP,  D-Dimer No results found for this basename: DDIMER,  in the last 72 hours Hemoglobin A1C No results found for this basename: HGBA1C,  in the last 72 hours Fasting Lipid Panel No results found for this basename: CHOL, HDL, LDLCALC, TRIG, CHOLHDL, LDLDIRECT,  in the last 72 hours Thyroid Function Tests No results found for this basename: TSH, T4TOTAL, FREET3, T3FREE, THYROIDAB,  in the last 72 hours  TELE  Normal sinus rhythm  ECG    Radiology/Studies  Ct Abdomen Pelvis W Contrast  06/26/2014   CLINICAL DATA:  Left inguinal pain. Progressively worsening. Left lower quadrant pain.  EXAM:  CT ABDOMEN AND PELVIS WITH CONTRAST  TECHNIQUE: Multidetector CT imaging of the abdomen and pelvis was performed using the standard protocol following bolus administration of intravenous contrast.  CONTRAST:  58mL OMNIPAQUE IOHEXOL 300 MG/ML SOLN, 163mL OMNIPAQUE IOHEXOL 300 MG/ML SOLN  COMPARISON:  10/27/2004 there is a large hiatal hernia, 10/27/2004  FINDINGS: There is a large hiatal hernia, stable since prior study. No confluent airspace opacities or effusions. Heart is mildly enlarged. Calcified granuloma noted in the right middle lobe.  Appendix is visualized and is normal. Small low-density lesions within the right hepatic lobe, most compatible with cysts. Spleen, pancreas, adrenals and left kidney are unremarkable. Nonobstructing stone  in the lower pole of the right kidney measures 6-7 mm. No hydronephrosis.  Diffuse colonic diverticulosis. There is significant wall thickening within the sigmoid colon which is presumably related to muscular hypertrophy from diverticulosis. However, the degree of wall thickening is somewhat concerning and malignancy cannot be completely excluded. Small scattered adjacent pericolonic lymph nodes, none pathologically enlarged based on CT size. No inflammatory changes around the sigmoid colon or elsewhere to suggest active diverticulitis. Small bowel is decompressed.  There is a left inguinal hernia containing the left anterior corner of the bladder and fat. No bowel herniation within the inguinal hernia.  No free fluid or free air. Aorta is heavily calcified. Non aneurysmal.  Degenerative changes in the lower lumbar and thoracic spine. No acute bony abnormality.  IMPRESSION: Diffuse colonic diverticulosis. Marked wall thickening within the sigmoid colon may represent changes from diverticulosis, but the degree of wall thickening is somewhat concerning. Cannot exclude malignancy. Recommend correlation with any recent colonoscopy or repeat colonoscopy if the patient has not had one recently.  Left inguinal hernia containing a small portion of the bladder wall and fat.  Large hiatal hernia.  Right nephrolithiasis.   Electronically Signed   By: Rolm Baptise M.D.   On: 06/26/2014 16:43    ASSESSMENT AND PLAN  . coronary artery disease. No chest pain or angina.  2. essential hypertension  3. Hypercholesterolemia  4. diabetes mellitus  Plan: Continue current cardiac meds.  Okay for discharge today.  Follow up with Dr. Daneen Schick.  Blood pressure is higher and we will restart HCTZ now.  Signed, Darlin Coco MD

## 2014-06-30 NOTE — Discharge Instructions (Signed)
[facility] _______[facility]  UMBILICAL OR INGUINAL HERNIA REPAIR: POST OP INSTRUCTIONS  Always review your discharge instruction sheet given to you by the facility where your surgery was performed. IF YOU HAVE DISABILITY OR FAMILY LEAVE FORMS, YOU MUST BRING THEM TO THE OFFICE FOR PROCESSING.   DO NOT GIVE THEM TO YOUR DOCTOR.  1. A  prescription for pain medication may be given to you upon discharge.  Take your pain medication as prescribed, if needed.  If narcotic pain medicine is not needed, then you may take acetaminophen (Tylenol) or ibuprofen (Advil) as needed. 2. Take your usually prescribed medications unless otherwise directed. 3. If you need a refill on your pain medication, please contact your pharmacy.  They will contact our office to request authorization. Prescriptions will not be filled after 5 pm or on week-ends. 4. You should follow a light diet the first 24 hours after arrival home, such as soup and crackers, etc.  Be sure to include lots of fluids daily.  Resume your normal diet the day after surgery. 5. Most patients will experience some swelling and bruising around the umbilicus or in the groin and scrotum.  Ice packs and reclining will help.  Swelling and bruising can take several days to resolve.  6. It is common to experience some constipation if taking pain medication after surgery.  Increasing fluid intake and taking a stool softener (such as Colace) will usually help or prevent this problem from occurring.  A mild laxative (Milk of Magnesia or Miralax) should be taken according to package directions if there are no bowel movements after 48 hours. 7. Unless discharge instructions indicate otherwise, you may remove your bandages 24-48 hours after surgery, and you may shower at that time.  You may have steri-strips (small skin tapes) in place directly over the incision.  These strips should be left on the skin for 7-10 days.  If your surgeon used skin glue on the  incision, you may shower in 24 hours.  The glue will flake off over the next 2-3 weeks.  Any sutures or staples will be removed at the office during your follow-up visit. 8. ACTIVITIES:  You may resume regular (light) daily activities beginning the next day--such as daily self-care, walking, climbing stairs--gradually increasing activities as tolerated.  You may have sexual intercourse when it is comfortable.  Refrain from any heavy lifting or straining until approved by your doctor. a. You may drive when you are no longer taking prescription pain medication, you can comfortably wear a seatbelt, and you can safely maneuver your car and apply brakes. b. RETURN TO WORK:  __________________________________________________________ 9. You should see your doctor in the office for a follow-up appointment approximately 2-3 weeks after your surgery.  Make sure that you call for this appointment within a day or two after you arrive home to insure a convenient appointment time. 10. OTHER INSTRUCTIONS:  __________________________________________________________________________________________________________________________________________________________________________________________  WHEN TO CALL YOUR DOCTOR: 1. Fever over 101.0 2. Inability to urinate 3. Nausea and/or vomiting 4. Extreme swelling or bruising 5. Continued bleeding from incision. 6. Increased pain, redness, or drainage from the incision  The clinic staff is available to answer your questions during regular business hours.  Please don't hesitate to call and ask to speak to one of the nurses for clinical concerns.  If you have a medical emergency, go to the nearest emergency room or call 911.  A surgeon from [facility] is always on call at the hospital     [address], [address], [place], [place]  [address] ?  [address] [address], [place], [place]   [address] [phone] ? [phone] ? FAX [phone] Web site:  [email or web address]  

## 2014-07-01 ENCOUNTER — Encounter (HOSPITAL_COMMUNITY): Payer: Self-pay | Admitting: General Surgery

## 2014-07-05 NOTE — Discharge Summary (Signed)
Physician Discharge Summary  Kenneth [name] BTD:[id number] DOB: 08/29/1928 DOA: 06/26/2014  PCP: Irven Shelling, MD  Consultation: cardiology---Dr. Johnsie Cancel   Admit date: 06/26/2014 Discharge date: 07/05/2014  Recommendations for Outpatient Follow-up:   Follow-up Information   Follow up with Merrie Roof, MD. Schedule an appointment as soon as possible for a visit in 3 weeks.   Specialty:  General Surgery   Contact information:   [address] [place] [address] [phone]      Discharge Diagnoses:  1. Left inguinal hernia 2. Hypertension 3. Diabetes mellitus 4. CAD   Surgical Procedure: left inguinal hernia repair---Dr. Fredrik Cove  Discharge Condition: stable Disposition: home  Diet recommendation: heart healthy   Filed Weights   06/26/14 1719 06/29/14 0400  Weight: 167 lb (75.751 kg) 166 lb 3.6 oz (75.4 kg)     Filed Vitals:   06/30/14 0530  BP: 156/58  Pulse: 58  Temp: 98.6 F (37 C)  Resp: 20     Hospital Course:  Kenneth [name] is a 80 year old male with a history of diabetes mellitus, CAD s/p CABG 1993 and hypertension who presented to [facility] after seeing his PCP today for a routine follow up. He complained of increased left inguinal pain. The patient reported a hernia at this spot over the past 5 years. He endorsed that over the past 2 months it has progressively worsened.  His work up shows a normal white count, renal function. A CT of abdomen and pelvis showed a bladder and fat containing left inguinal hernia.  He was therefore admitted and underwent repair with insertion of mesh.  A cardiology consultation was obtained prior to surgery who followed the patient throughout the admission.    He had an unremarkable post operative course.  On POD#1 the patient was felt stable for discharge home.  He is to follow up with Dr. [name] in 3 weeks.  Call sooner with questions or concerns.     Discharge Instructions  Discharge Instructions    Discharge patient    Complete by:  As directed             Medication List         acetaminophen 325 MG tablet  Commonly known as:  TYLENOL  Take 325 mg by mouth every 6 (six) hours as needed for mild pain.     amLODipine 10 MG tablet  Commonly known as:  NORVASC  Take 10 mg by mouth every evening.     ferrous fumarate 325 (106 FE) MG Tabs tablet  Commonly known as:  HEMOCYTE - 106 mg FE  Take 1 tablet by mouth daily.     hydrochlorothiazide 25 MG tablet  Commonly known as:  HYDRODIURIL  Take 25 mg by mouth every morning.     hydrochlorothiazide 12.5 MG capsule  Commonly known as:  MICROZIDE  Take 1 capsule (12.5 mg total) by mouth daily.     isosorbide mononitrate 60 MG 24 hr tablet  Commonly known as:  IMDUR  Take 60 mg by mouth every evening.     metFORMIN 1000 MG tablet  Commonly known as:  GLUCOPHAGE  Take 1,000 mg by mouth daily with breakfast.     metoprolol succinate 50 MG 24 hr tablet  Commonly known as:  TOPROL-XL  TAKE 1 TABLET DAILY     multivitamin with minerals Tabs tablet  Take 1 tablet by mouth daily.     nitroGLYCERIN 0.4 MG SL tablet  Commonly known  as:  NITROSTAT  Place 0.4 mg under the tongue every 5 (five) minutes as needed for chest pain.     oxyCODONE-acetaminophen 5-325 MG per tablet  Commonly known as:  PERCOCET/ROXICET  Take 1-2 tablets by mouth every 4 (four) hours as needed for moderate pain.     pantoprazole 40 MG tablet  Commonly known as:  PROTONIX  Take 40 mg by mouth daily.     potassium chloride 10 MEQ tablet  Commonly known as:  K-DUR,KLOR-CON  Take 10 mEq by mouth daily.     simvastatin 20 MG tablet  Commonly known as:  ZOCOR  Take 20 mg by mouth every evening.     VITAMIN B-12 PO  Take 1 tablet by mouth daily.           Follow-up Information   Follow up with Merrie Roof, MD. Schedule an appointment as soon as possible for a visit in 3 weeks.   Specialty:  General Surgery   Contact information:   [address] [place] [address] [phone]        The results of significant diagnostics from this hospitalization (including imaging, microbiology, ancillary and laboratory) are listed below for reference.    Significant Diagnostic Studies: Ct Abdomen Pelvis W Contrast  06/26/2014   CLINICAL DATA:  Left inguinal pain. Progressively worsening. Left lower quadrant pain.  EXAM: CT ABDOMEN AND PELVIS WITH CONTRAST  TECHNIQUE: Multidetector CT imaging of the abdomen and pelvis was performed using the standard protocol following bolus administration of intravenous contrast.  CONTRAST:  56mL OMNIPAQUE IOHEXOL 300 MG/ML SOLN, 134mL OMNIPAQUE IOHEXOL 300 MG/ML SOLN  COMPARISON:  10/27/2004 there is a large hiatal hernia, 10/27/2004  FINDINGS: There is a large hiatal hernia, stable since prior study. No confluent airspace opacities or effusions. Heart is mildly enlarged. Calcified granuloma noted in the right middle lobe.  Appendix is visualized and is normal. Small low-density lesions within the right hepatic lobe, most compatible with cysts. Spleen, pancreas, adrenals and left kidney are unremarkable. Nonobstructing stone in the lower pole of the right kidney measures 6-7 mm. No hydronephrosis.  Diffuse colonic diverticulosis. There is significant wall thickening within the sigmoid colon which is presumably related to muscular hypertrophy from diverticulosis. However, the degree of wall thickening is somewhat concerning and malignancy cannot be completely excluded. Small scattered adjacent pericolonic lymph nodes, none pathologically enlarged based on CT size. No inflammatory changes around the sigmoid colon or elsewhere to suggest active diverticulitis. Small bowel is decompressed.  There is a left inguinal hernia containing the left anterior corner of the bladder and fat. No bowel herniation within the inguinal hernia.  No free fluid or free air. Aorta is heavily calcified. Non aneurysmal.   Degenerative changes in the lower lumbar and thoracic spine. No acute bony abnormality.  IMPRESSION: Diffuse colonic diverticulosis. Marked wall thickening within the sigmoid colon may represent changes from diverticulosis, but the degree of wall thickening is somewhat concerning. Cannot exclude malignancy. Recommend correlation with any recent colonoscopy or repeat colonoscopy if the patient has not had one recently.  Left inguinal hernia containing a small portion of the bladder wall and fat.  Large hiatal hernia.  Right nephrolithiasis.   Electronically Signed   By: Rolm Baptise M.D.   On: 06/26/2014 16:43    Microbiology: Recent Results (from the past 240 hour(s))  SURGICAL PCR SCREEN     Status: None   Collection Time    06/27/14  5:43 AM  Result Value Ref Range Status   MRSA, PCR NEGATIVE  NEGATIVE Final   Staphylococcus aureus NEGATIVE  NEGATIVE Final   Comment:            The Xpert SA Assay (FDA     approved for NASAL specimens     in patients over 72 years of age),     is one component of     a comprehensive surveillance     program.  Test performance has     been validated by [employer] for patients greater     than or equal to 32 year old.     It is not intended     to diagnose infection nor to     guide or monitor treatment.     Labs: Basic Metabolic Panel: No results found for this basename: NA, K, CL, CO2, GLUCOSE, BUN, CREATININE, CALCIUM, MG, PHOS,  in the last 168 hours Liver Function Tests: No results found for this basename: AST, ALT, ALKPHOS, BILITOT, PROT, ALBUMIN,  in the last 168 hours No results found for this basename: LIPASE, AMYLASE,  in the last 168 hours No results found for this basename: AMMONIA,  in the last 168 hours CBC: No results found for this basename: WBC, NEUTROABS, HGB, HCT, MCV, PLT,  in the last 168 hours Cardiac Enzymes: No results found for this basename: CKTOTAL, CKMB, CKMBINDEX, TROPONINI,  in the last 168 hours BNP: BNP  (last 3 results) No results found for this basename: PROBNP,  in the last 8760 hours CBG:  Recent Labs Lab 06/29/14 1225 06/29/14 1635 06/29/14 2211 06/30/14 0737 06/30/14 1202  GLUCAP 213* 150* 146* 143* 116*    Active Problems:   Diabetes   HTN (hypertension)   Coronary atherosclerosis of unspecified type of vessel, native or graft   Left inguinal hernia   Preop cardiovascular exam   Time coordinating discharge: <30 mins  Signed:  Verneice Caspers, ANP-BC

## 2014-07-13 ENCOUNTER — Encounter: Payer: Self-pay | Admitting: *Deleted

## 2014-07-22 ENCOUNTER — Encounter (INDEPENDENT_AMBULATORY_CARE_PROVIDER_SITE_OTHER): Payer: Self-pay | Admitting: General Surgery

## 2014-07-22 ENCOUNTER — Ambulatory Visit (INDEPENDENT_AMBULATORY_CARE_PROVIDER_SITE_OTHER): Payer: Medicare Other | Admitting: General Surgery

## 2014-07-22 VITALS — BP 128/74 | HR 84 | Resp 18 | Ht 71.0 in | Wt 160.6 lb

## 2014-07-22 DIAGNOSIS — K4090 Unilateral inguinal hernia, without obstruction or gangrene, not specified as recurrent: Secondary | ICD-10-CM

## 2014-07-22 NOTE — Patient Instructions (Signed)
No heavy lifting 

## 2014-07-22 NOTE — Progress Notes (Signed)
Subjective:     Patient ID: Kenneth [name], male   DOB: 02-Jan-1928, 80 y.o.   MRN: [id number]  HPI The patient is an 80 year old white male who is 3 weeks status post left inguinal hernia repair with mesh. His main complaint is that he has a lack of energy and poor appetite. He has had some nausea and one or 2 episodes of vomiting. He also relates between constipated and loose stools. He has been passing a lot of flatus. He also complains of a small lump on his left mid abdomen that has been present for years.  Review of Systems  Constitutional: Positive for appetite change and fatigue.  HENT: Negative.   Eyes: Negative.   Respiratory: Negative.   Cardiovascular: Negative.   Gastrointestinal: Negative.   Endocrine: Negative.   Genitourinary: Negative.   Musculoskeletal: Negative.   Skin: Negative.   Allergic/Immunologic: Negative.   Neurological: Negative.   Hematological: Negative.   Psychiatric/Behavioral: Negative.        Objective:   Physical Exam  Constitutional: He is oriented to person, place, and time. He appears well-developed and well-nourished.  HENT:  Head: Normocephalic and atraumatic.  Eyes: Conjunctivae and EOM are normal. Pupils are equal, round, and reactive to light.  Neck: Normal range of motion. Neck supple.  Cardiovascular: Normal rate, regular rhythm and normal heart sounds.   Pulmonary/Chest: Effort normal and breath sounds normal.  Abdominal: Soft. Bowel sounds are normal.  There is a small fatty feeling mass on his left mid abdomen measuring approximately 3-4 cm in diameter.  Genitourinary:  The left inguinal incision is healing nicely. He has a normal healing ridge. There is no sign of infection or significant seroma. His abdominal wall feels solid in the groin area with no palpable evidence of recurrence of the hernia.  Musculoskeletal: Normal range of motion.  Neurological: He is alert and oriented to person, place, and time.  Skin: Skin is warm and  dry.  Psychiatric: He has a normal mood and affect. His behavior is normal.       Assessment:     The patient is 3 weeks status post left inguinal hernia repair with mesh.     Plan:     At this point he seems to be healing well. I would like him to refrain from any heavy lifting. I will plan to see him back in one month to check his progress. It is difficult to tell whether the fatty feeling mass on his left midabdomen is a lipoma or possible Spigelian hernia. I will plan to get a CT scan of his abdomen and pelvis when he returns for his next followup.

## 2014-08-05 ENCOUNTER — Telehealth (INDEPENDENT_AMBULATORY_CARE_PROVIDER_SITE_OTHER): Payer: Self-pay

## 2014-08-05 NOTE — Telephone Encounter (Signed)
Pt s/p inguinal hernia repair on 06/28/14 by Dr [name]. Pt is calling today to get a refill on his Oxycodone 5/325mg . Pt states that this is will be his first refill. Pt denies any other symptoms at this time. Pt advised that we will contact him as soon as we receive a message from Dr [name]. Informed pt that if Dr [name] refilled he will have to come by the office to pick this rx up. Pt verbalized understanding

## 2014-08-05 NOTE — Telephone Encounter (Signed)
Informed pt that Rx was ready for pickup at the front desk for Percocet 5/325mg  1-2 tabs every 4-6hrs prn #50. Pt verbalized understanding

## 2014-08-16 ENCOUNTER — Other Ambulatory Visit (INDEPENDENT_AMBULATORY_CARE_PROVIDER_SITE_OTHER): Payer: Self-pay

## 2014-08-16 ENCOUNTER — Encounter (INDEPENDENT_AMBULATORY_CARE_PROVIDER_SITE_OTHER): Payer: Medicare Other | Admitting: General Surgery

## 2014-08-16 DIAGNOSIS — R1900 Intra-abdominal and pelvic swelling, mass and lump, unspecified site: Secondary | ICD-10-CM

## 2014-08-22 ENCOUNTER — Inpatient Hospital Stay: Admission: RE | Admit: 2014-08-22 | Payer: Medicare Other | Source: Ambulatory Visit

## 2014-08-22 ENCOUNTER — Ambulatory Visit
Admission: RE | Admit: 2014-08-22 | Discharge: 2014-08-22 | Disposition: A | Discharge status: Home / Self Care | Payer: Medicare Other | Source: Ambulatory Visit | Attending: General Surgery | Admitting: General Surgery

## 2014-08-22 DIAGNOSIS — R1900 Intra-abdominal and pelvic swelling, mass and lump, unspecified site: Secondary | ICD-10-CM

## 2014-08-22 MED ORDER — IOHEXOL 300 MG/ML  SOLN
100.0000 mL | Freq: Once | INTRAMUSCULAR | Status: AC | PRN
  Administered 2014-08-22: 100 mL via INTRAVENOUS

## 2014-09-04 ENCOUNTER — Telehealth (INDEPENDENT_AMBULATORY_CARE_PROVIDER_SITE_OTHER): Payer: Self-pay

## 2014-09-04 NOTE — Telephone Encounter (Signed)
Dr Coral Spikes called to see if Dr [name] had reviewed pts CT results from 08/22/14. Dr Coral Spikes states that the pt is continuing to have abdominal pain and tenderness. Dr Coral Spikes at this time did not want to page Dr [name], just wanted him to review CT and possibly contact the pt. Informed Dr Coral Spikes that I would send Dr [name] a message and we would contact the pt as soon as we received a response.

## 2014-09-04 NOTE — Telephone Encounter (Signed)
Looks like he has diverticulitis. I hope his medical doctor is treating this. I doubt he has a recurrent hernia since I just fixed it but I will be happy to see him

## 2014-09-05 NOTE — Telephone Encounter (Signed)
Contact pt he states that his dr has been treating him for Diverticulitis. Pt states that he would like to come back in to see Dr [name] again. Appt was made for pt

## 2014-09-24 ENCOUNTER — Other Ambulatory Visit (INDEPENDENT_AMBULATORY_CARE_PROVIDER_SITE_OTHER): Payer: Self-pay | Admitting: General Surgery

## 2014-09-24 NOTE — H&P (Signed)
Kenneth [name] 09/24/2014 11:29 AM Location: [facility] Patient #: [id number] DOB: [birth year] Married / Language: Kenneth [name] / Race: White Male  History of Present Illness Kenneth [name]. Kenneth Starks MD; 09/24/2014 12:53 PM) Patient words: recheck hernia.  The patient is a 80 year old male who presents for a follow-up for abdominal pain. The patient is an 80 year old white male who is over 2 months out from a left inguinal hernia repair with mesh. He has been complaining of some pain and swelling in his left mid abdomen. He denies any nausea or vomiting. He denies any fevers or chills. He does intermittently noticed a bulge in this location. He states that he has enough pain in this area that his quality of life is poor. He would like to have this fixed.Since his last visit he underwent a CT scan which did show a spigelian hernia in this location. There did not appear to be bowel protruding through the defect.   Review of Systems Kenneth [name] S. Kenneth Starks MD; 09/24/2014 12:53 PM) General Not Present- Appetite Loss, Chills, Fatigue, Fever, Night Sweats, Weight Gain and Weight Loss. Skin Not Present- Change in Wart/Mole, Dryness, Hives, Jaundice, New Lesions, Non-Healing Wounds, Rash and Ulcer. HEENT Not Present- Earache, Hearing Loss, Hoarseness, Nose Bleed, Oral Ulcers, Ringing in the Ears, Seasonal Allergies, Sinus Pain, Sore Throat, Visual Disturbances, Wears glasses/contact lenses and Yellow Eyes. Respiratory Not Present- Bloody sputum, Chronic Cough, Difficulty Breathing, Snoring and Wheezing. Breast Not Present- Breast Mass, Breast Pain, Nipple Discharge and Skin Changes. Cardiovascular Not Present- Chest Pain, Difficulty Breathing Lying Down, Leg Cramps, Palpitations, Rapid Heart Rate, Shortness of Breath and Swelling of Extremities. Gastrointestinal Present- Abdominal Pain. Not Present- Bloating, Bloody Stool, Change in Bowel Habits, Chronic diarrhea, Constipation, Difficulty Swallowing, Excessive gas,  Gets full quickly at meals, Hemorrhoids, Indigestion, Nausea, Rectal Pain and Vomiting. Musculoskeletal Not Present- Back Pain, Joint Pain, Joint Stiffness, Muscle Pain, Muscle Weakness and Swelling of Extremities. Neurological Not Present- Decreased Memory, Fainting, Headaches, Numbness, Seizures, Tingling, Tremor, Trouble walking and Weakness. Psychiatric Not Present- Anxiety, Bipolar, Change in Sleep Pattern, Depression, Fearful and Frequent crying. Endocrine Not Present- Cold Intolerance, Excessive Hunger, Hair Changes, Heat Intolerance and New Diabetes. Hematology Not Present- Easy Bruising, Excessive bleeding, Gland problems, HIV and Persistent Infections.   Vitals Kenneth [name] CMA; 09/24/2014 11:30 AM) 09/24/2014 11:30 AM Weight: 159 lb Height: 70in Body Surface Area: 1.89 m Body Mass Index: 22.81 kg/m Temp.: 99.39F  Pulse: 86 (Regular)  BP: 150/72 (Sitting, Left Arm, Standard)    Physical Exam Kenneth [name] S. Kenneth Starks MD; 09/24/2014 12:54 PM) General Mental Status-Alert. General Appearance-Consistent with stated age. Hydration-Well hydrated. Voice-Normal.  Head and Neck Head-normocephalic, atraumatic with no lesions or palpable masses. Trachea-midline. Thyroid Gland Characteristics - normal size and consistency.  Eye Eyeball - Bilateral-Extraocular movements intact. Sclera/Conjunctiva - Bilateral-No scleral icterus.  Chest and Lung Exam Chest and lung exam reveals -quiet, even and easy respiratory effort with no use of accessory muscles and on auscultation, normal breath sounds, no adventitious sounds and normal vocal resonance. Inspection Chest Wall - Normal. Back - normal.  Breast Breast - Left-Symmetric, Non Tender, No Biopsy scars, no Dimpling, No Inflammation, No Lumpectomy scars, No Mastectomy scars, No Peau d' Orange. Breast - Right-Symmetric, Non Tender, No Biopsy scars, no Dimpling, No Inflammation, No Lumpectomy scars, No  Mastectomy scars, No Peau d' Orange. Breast Lump-No Palpable Breast Mass.  Cardiovascular Cardiovascular examination reveals -normal heart sounds, regular rate and rhythm with no murmurs and normal pedal pulses bilaterally.  Abdomen Note: The abdomen is soft with minimal tenderness. There is a palpable bulge on the left mid abdomen that does not reduce. There is no sign of obstruction.   Neurologic Neurologic evaluation reveals -alert and oriented x 3 with no impairment of recent or remote memory. Mental Status-Normal.  Musculoskeletal Normal Exam - Left-Upper Extremity Strength Normal and Lower Extremity Strength Normal. Normal Exam - Right-Upper Extremity Strength Normal and Lower Extremity Strength Normal.  Lymphatic Head & Neck  General Head & Neck Lymphatics: Bilateral - Description - Normal. Axillary  General Axillary Region: Bilateral - Description - Normal. Tenderness - Non Tender. Femoral & Inguinal  Generalized Femoral & Inguinal Lymphatics: Bilateral - Description - Normal. Tenderness - Non Tender.    Assessment & Plan Kenneth [name] S. Kenneth Starks MD; 09/24/2014 12:15 PM) VENTRAL HERNIA (553.20  K43.9) VENTRAL HERNIA WITHOUT OBSTRUCTION OR GANGRENE (553.20  K43.9) Impression: The patient appears to have a spigellian hernia on the left mid abdomen. He is relatively symptomatic from it. Because of the risk of incarceration and strangulation I think he would benefit from having the hernia fixed. I think he is a good laparoscopic candidate. I discussed with him in detail the risks and benefits of the operation to fix the hernia as well some of the technical aspects and he understands and wishes to proceed     Signed by Kenneth Cook, MD (09/24/2014 12:54 PM)

## 2014-09-27 ENCOUNTER — Encounter (HOSPITAL_COMMUNITY): Payer: Self-pay | Admitting: Pharmacy Technician

## 2014-10-03 ENCOUNTER — Other Ambulatory Visit (HOSPITAL_COMMUNITY): Payer: Self-pay | Admitting: *Deleted

## 2014-10-03 NOTE — Pre-Procedure Instructions (Addendum)
Kenneth [name]  10/03/2014   Your procedure is scheduled on:  Friday, October 11, 2014    Report to [facility] Entrance "A" Admitting Office at 5:30 AM.   Call this number if you have problems the morning of surgery: [phone]   Remember:   Do not eat food or drink liquids after midnight Thursday, 10/10/14.   Take these medicines the morning of surgery with A SIP OF WATER: metoprolol succinate (TOPROL-XL),  pantoprazole (PROTONIX), nitroGLYCERIN (NITROSTAT) - if needed, acetaminophen (TYLENOL) - if needed.     Take all meds as ordered until day of surgery except as instructed below or per dr  Bridgette Habermann all herbel meds, nsaids (aleve,naproxen,advil,ibuprofen) 5 days prior to surgery starting sun 10/06/14 Do NOT take your diabetic medications the morning of surgery.     Do not wear jewelry.  Do not wear lotions, powders, or cologne. You may wear deodorant.  Men may shave face and neck.  Do not bring valuables to the hospital.  [facility] is not responsible                  for any belongings or valuables.               Contacts, dentures or bridgework may not be worn into surgery.  Leave suitcase in the car. After surgery it may be brought to your room.  For patients admitted to the hospital, discharge time is determined by your                treatment team.               Special Instructions: [facility] - Preparing for Surgery  Before surgery, you can play an important role.  Because skin is not sterile, your skin needs to be as free of germs as possible.  You can reduce the number of germs on you skin by washing with CHG (chlorahexidine gluconate) soap before surgery.  CHG is an antiseptic cleaner which kills germs and bonds with the skin to continue killing germs even after washing.  Please DO NOT use if you have an allergy to CHG or antibacterial soaps.  If your skin becomes reddened/irritated stop using the CHG and inform your nurse when you arrive at Short Stay.  Do  not shave (including legs and underarms) for at least 48 hours prior to the first CHG shower.  You may shave your face.  Please follow these instructions carefully:   1.  Shower with CHG Soap the night before surgery and the                                morning of Surgery.  2.  If you choose to wash your hair, wash your hair first as usual with your       normal shampoo.  3.  After you shampoo, rinse your hair and body thoroughly to remove the                      Shampoo.  4.  Use CHG as you would any other liquid soap.  You can apply chg directly       to the skin and wash gently with scrungie or a clean washcloth.  5.  Apply the CHG Soap to your body ONLY FROM THE NECK DOWN.        Do not use on open wounds  or open sores.  Avoid contact with your eyes, ears, mouth and genitals (private parts).  Wash genitals (private parts) with your normal soap.  6.  Wash thoroughly, paying special attention to the area where your surgery        will be performed.  7.  Thoroughly rinse your body with warm water from the neck down.  8.  DO NOT shower/wash with your normal soap after using and rinsing off       the CHG Soap.  9.  Pat yourself dry with a clean towel.            10.  Wear clean pajamas.            11.  Place clean sheets on your bed the night of your first shower and do not        sleep with pets.  Day of Surgery  Do not apply any lotions the morning of surgery.  Please wear clean clothes to the hospital/surgery .     Please read over the following fact sheets that you were given: Pain Booklet, Coughing and Deep Breathing and Surgical Site Infection Prevention

## 2014-10-04 ENCOUNTER — Encounter (HOSPITAL_COMMUNITY)
Admission: RE | Admit: 2014-10-04 | Discharge: 2014-10-04 | Disposition: A | Discharge status: Home / Self Care | Payer: Medicare Other | Source: Ambulatory Visit | Attending: General Surgery | Admitting: General Surgery

## 2014-10-04 ENCOUNTER — Encounter (HOSPITAL_COMMUNITY): Payer: Self-pay

## 2014-10-04 ENCOUNTER — Other Ambulatory Visit: Payer: Self-pay

## 2014-10-04 DIAGNOSIS — D649 Anemia, unspecified: Secondary | ICD-10-CM | POA: Insufficient documentation

## 2014-10-04 DIAGNOSIS — Z951 Presence of aortocoronary bypass graft: Secondary | ICD-10-CM | POA: Diagnosis not present

## 2014-10-04 DIAGNOSIS — Z01818 Encounter for other preprocedural examination: Secondary | ICD-10-CM | POA: Insufficient documentation

## 2014-10-04 DIAGNOSIS — K219 Gastro-esophageal reflux disease without esophagitis: Secondary | ICD-10-CM | POA: Diagnosis not present

## 2014-10-04 DIAGNOSIS — I25119 Atherosclerotic heart disease of native coronary artery with unspecified angina pectoris: Secondary | ICD-10-CM | POA: Insufficient documentation

## 2014-10-04 DIAGNOSIS — C439 Malignant melanoma of skin, unspecified: Secondary | ICD-10-CM | POA: Diagnosis not present

## 2014-10-04 DIAGNOSIS — A770 Spotted fever due to Rickettsia rickettsii: Secondary | ICD-10-CM | POA: Diagnosis not present

## 2014-10-04 DIAGNOSIS — I10 Essential (primary) hypertension: Secondary | ICD-10-CM | POA: Diagnosis not present

## 2014-10-04 DIAGNOSIS — K449 Diaphragmatic hernia without obstruction or gangrene: Secondary | ICD-10-CM | POA: Insufficient documentation

## 2014-10-04 DIAGNOSIS — F17200 Nicotine dependence, unspecified, uncomplicated: Secondary | ICD-10-CM | POA: Insufficient documentation

## 2014-10-04 DIAGNOSIS — E785 Hyperlipidemia, unspecified: Secondary | ICD-10-CM | POA: Diagnosis not present

## 2014-10-04 DIAGNOSIS — I2582 Chronic total occlusion of coronary artery: Secondary | ICD-10-CM | POA: Diagnosis not present

## 2014-10-04 DIAGNOSIS — N200 Calculus of kidney: Secondary | ICD-10-CM | POA: Insufficient documentation

## 2014-10-04 DIAGNOSIS — E118 Type 2 diabetes mellitus with unspecified complications: Secondary | ICD-10-CM | POA: Diagnosis not present

## 2014-10-04 HISTORY — DX: Personal history of urinary calculi: Z87.442

## 2014-10-04 HISTORY — DX: Angina pectoris, unspecified: I20.9

## 2014-10-04 HISTORY — DX: Personal history of other diseases of the digestive system: Z87.19

## 2014-10-04 HISTORY — DX: Cardiac arrhythmia, unspecified: I49.9

## 2014-10-04 HISTORY — DX: Unspecified osteoarthritis, unspecified site: M19.90

## 2014-10-04 LAB — BASIC METABOLIC PANEL
Anion gap: 12 (ref 5–15)
BUN: 12 mg/dL (ref 6–23)
CO2: 25 meq/L (ref 19–32)
CREATININE: 0.71 mg/dL (ref 0.50–1.35)
Calcium: 9.2 mg/dL (ref 8.4–10.5)
Chloride: 101 mEq/L (ref 96–112)
GFR calc Af Amer: >90 mL/min (ref >90)
GFR, EST NON AFRICAN AMERICAN: 84 mL/min — AB (ref >90)
Glucose, Bld: 197 mg/dL — ABNORMAL HIGH (ref 70–99)
Potassium: 4 mEq/L (ref 3.7–5.3)
Sodium: 138 mEq/L (ref 137–147)

## 2014-10-04 LAB — CBC
HEMATOCRIT: 34.5 % — AB (ref 39.0–52.0)
Hemoglobin: 11.3 g/dL — ABNORMAL LOW (ref 13.0–17.0)
MCH: 30.4 pg (ref 26.0–34.0)
MCHC: 32.8 g/dL (ref 30.0–36.0)
MCV: 92.7 fL (ref 78.0–100.0)
PLATELETS: 414 10*3/uL — AB (ref 150–400)
RBC: 3.72 MIL/uL — ABNORMAL LOW (ref 4.22–5.81)
RDW: 15.7 % — ABNORMAL HIGH (ref 11.5–15.5)
WBC: 7.9 10*3/uL (ref 4.0–10.5)

## 2014-10-04 NOTE — Progress Notes (Signed)
Has visit scheduled with dr h Tamala Julian 10/07/14 preop

## 2014-10-07 ENCOUNTER — Ambulatory Visit (INDEPENDENT_AMBULATORY_CARE_PROVIDER_SITE_OTHER): Payer: Medicare Other | Admitting: Interventional Cardiology

## 2014-10-07 ENCOUNTER — Encounter: Payer: Self-pay | Admitting: Interventional Cardiology

## 2014-10-07 VITALS — BP 140/80 | HR 91 | Ht 71.0 in | Wt 162.0 lb

## 2014-10-07 DIAGNOSIS — I10 Essential (primary) hypertension: Secondary | ICD-10-CM

## 2014-10-07 DIAGNOSIS — E1169 Type 2 diabetes mellitus with other specified complication: Secondary | ICD-10-CM

## 2014-10-07 DIAGNOSIS — I25810 Atherosclerosis of coronary artery bypass graft(s) without angina pectoris: Secondary | ICD-10-CM

## 2014-10-07 DIAGNOSIS — Z01810 Encounter for preprocedural cardiovascular examination: Secondary | ICD-10-CM

## 2014-10-07 NOTE — Progress Notes (Signed)
Anesthesia Chart Review:  Patient is a 80 year old male scheduled for laparoscopic VHR on 10/11/14 by Dr. [name].   History includes smoking, CAD s/p CABG (LIMA to LAD, SVG to OM, SVG to RCA) '93 (cath 2012 with patient grafts but occluded LM with proximal and mid LAD territory supplied by collaterals. No revascularization options), anginal pain (on Imdur), dysrhythmia (occasional skipped beats), HTN, DM2, HLD, GERD, hiatal hernia, melanoma, anemia, [place] Spotted Fever, nephrolithiasis, left IHR 06/28/14, post-operative N/V.  PCP is listed as Dr. Lavone Orn. Cardiologist is Dr. Linard Millers, visit 10/07/14 with cardiac clearance for this procedure.  EKG on 10/04/14 showed: SR with occasional PACs, anteroseptal infarct (age undetermined).  There is loss of his sr wave in V3 when compared to EKG on 05/07/10.  Cardiac cath on 06/04/11:  1. Severe native vessel coronary artery disease with total occlusion of the proximal RCA and distal left main at the origin of the LAD and LCX. 2. Patent saphenous vein and left internal mammary artery graft. 3. Abnormal nuclear study with basal and mid anterior wall ischemia due to occlusion of the ostial LAD preventing antegrade flow and occlusion of the mid-LAD at the anastomoses with the internal mammary leaving the proximal and mid anterior wall depending upon poorly formed collaterals from the right coronary territory and the obtuse marginal territory. The patient also has diffuse disease in the LAD distal to the LIMA graft insertion, however, there is no evidence of ischemia in the distal and apical LAD territory. 4. Preserved overall LV function with inferior wall motion abnormalities. PLAN: 1. Medication adjustments will include addition of long-acting nitrates to hopefully help improve collateral blood flow to the proximal and mid anterior wall. Aggressive systolic blood pressure control. There is no reasonable treatment option with reference to percutaneous  angioplasty given the patient's anatomy. Despite the high risk appearance of the patient's nuclear study, the anatomy would suggest a relatively stable clinical situation, although time and perhaps medication adjustment may help to further decrease the patient's risk by improving collateral flow to the anterior wall. (Dr. Daneen Schick)  Preoperative labs noted.   He was cleared by his cardiologist. If no acute changes then I would anticipate that he could proceed as planned.  George [facility] Short Stay Center/Anesthesiology Phone [phone] 10/07/2014 2:50 PM

## 2014-10-07 NOTE — Patient Instructions (Signed)
Your physician recommends that you continue on your current medications as directed. Please refer to the Current Medication list given to you today.  Your physician wants you to follow-up in: 1 year with Dr.Smith You will receive a reminder letter in the mail two months in advance. If you don't receive a letter, please call our office to schedule the follow-up appointment.  

## 2014-10-07 NOTE — Progress Notes (Signed)
Patient ID: AIDIAN SALOMON, male   DOB: [birth year], 80 y.o.   MRN: [id number]    [address], [address] [place], [place]  [address] Phone: [phone] Fax:  [phone]  Date:  10/07/2014   ID:  Kenneth [name], DOB 1928-06-12, MRN [id number]  PCP:  Irven Shelling, MD   ASSESSMENT:  1. Preoperative cardiovascular evaluation 2. Coronary artery disease with prior coronary bypass grafting 1993, asymptomatic 3. Hyperlipidemia 4. Hypertension 5. Diabetes mellitus  PLAN:  1. The patient is stable without evidence of angina, heart failure, or unstable arrhythmia. He is cleared for the upcoming hernia procedure by Dr. [name] 2. No change in the current medical regimen 3. Clinical follow-up in one year   SUBJECTIVE: Kenneth [name] is a 80 y.o. male Who is doing well. He has had no recent significant anemia. He has had difficulty with chronic iron deficiency anemia but this is been stable now for several years. He denies chest discomfort. There is exertional dyspnea after proximally 300 yards. He has no orthopnea. He denies palpitations and syncope. He has not had lower extremity swelling. No episodes of syncope have occurred.   Wt Readings from Last 3 Encounters:  10/07/14 162 lb (73.483 kg)  10/04/14 162 lb 11.2 oz (73.8 kg)  07/22/14 160 lb 9.6 oz (72.848 kg)     Past Medical History  Diagnosis Date  . Diabetes   . HTN (hypertension)   . Coronary atherosclerosis of unspecified type of vessel, native or graft     CABG 1993, LIMA to LAD, SVG to obtuse marginal one, SVG to posterior descending, stable angina  . Hyperlipidemia `  . GERD (gastroesophageal reflux disease)   . Chronic rhinitis   . Glaucoma   . Melanoma of skin, site unspecified   . Hernia     left side  . Complication of anesthesia   . PONV (postoperative nausea and vomiting)   . Anemia     on iron  . [facility] spotted fever 2-3 yrs ago  . CAD (coronary artery disease)   . Diverticulosis of  colon   . Anginal pain     last 3 days ago goes away if stop and rest  . Dysrhythmia     occ "skips a beat"  . History of kidney stones   . H/O hiatal hernia   . Arthritis     Current Outpatient Prescriptions  Medication Sig Dispense Refill  . acetaminophen (TYLENOL) 325 MG tablet Take 325 mg by mouth every 6 (six) hours as needed for mild pain.     [name] amLODipine (NORVASC) 10 MG tablet Take 10 mg by mouth every evening.     . Cyanocobalamin (VITAMIN B-12 PO) Take 1 tablet by mouth daily.    . ferrous fumarate (HEMOCYTE - 106 MG FE) 325 (106 FE) MG TABS tablet Take 1 tablet by mouth daily.     . hydrochlorothiazide (HYDRODIURIL) 25 MG tablet   0  . hydrochlorothiazide (MICROZIDE) 12.5 MG capsule   0  . isosorbide mononitrate (IMDUR) 60 MG 24 hr tablet Take 60 mg by mouth every evening.     . latanoprost (XALATAN) 0.005 % ophthalmic solution Place 1 drop into both eyes at bedtime.    . metFORMIN (GLUCOPHAGE) 1000 MG tablet Take 1,000 mg by mouth daily with breakfast.     . metoprolol succinate (TOPROL-XL) 50 MG 24 hr tablet Take 50 mg by mouth daily. Take with or immediately following a meal.    .  Multiple Vitamin (MULTIVITAMIN WITH MINERALS) TABS tablet Take 1 tablet by mouth daily.    . nitroGLYCERIN (NITROSTAT) 0.4 MG SL tablet Place 0.4 mg under the tongue every 5 (five) minutes as needed for chest pain.    [name] oxyCODONE-acetaminophen (PERCOCET/ROXICET) 5-325 MG per tablet   0  . pantoprazole (PROTONIX) 40 MG tablet Take 40 mg by mouth daily.    . potassium chloride (K-DUR) 10 MEQ tablet     . potassium chloride (K-DUR,KLOR-CON) 10 MEQ tablet Take 10 mEq by mouth daily.     . simvastatin (ZOCOR) 20 MG tablet Take 20 mg by mouth every evening.     No current facility-administered medications for this visit.    Allergies:    Allergies  Allergen Reactions  . Aspirin Other (See Comments)    Bleeding.    . Lipitor [Atorvastatin] Other (See Comments)    tired  . Lopressor [Metoprolol  Tartrate] Other (See Comments)    Sleepy all the time  . Penicillins Rash    Social History:  The patient  reports that he has been smoking Cigars.  He has never used smokeless tobacco. He reports that he does not drink alcohol or use illicit drugs.   ROS:  Please see the history of present illness.   Denies melena. Troubled by left inguinal hernia. No orthopnea. No nitroglycerin use. No recent neurological complaints.   All other systems reviewed and negative.   OBJECTIVE: VS:  BP 140/80 mmHg  Pulse 91  Ht 5\' 11"  (1.803 m)  Wt 162 lb (73.483 kg)  BMI 22.60 kg/m2 Well nourished, well developed, in no acute distress, elderly HEENT: normal Neck: JVD flat. Carotid bruit absent  Cardiac:  normal S1, S2; RRR; no murmur Lungs:  clear to auscultation bilaterally, no wheezing, rhonchi or rales Abd: soft, nontender, no hepatomegaly Ext: Edema absent. Pulses 1-2+ bilateral Skin: warm and dry Neuro:  CNs 2-12 intact, no focal abnormalities noted  EKG:  Left ventricular hypertrophy. No acute ST-T wave change. No change compared to prior. Biatrial abnormality.       Signed, Illene Labrador III, MD 10/07/2014 2:09 PM

## 2014-10-10 MED ORDER — VANCOMYCIN HCL IN DEXTROSE 1-5 GM/200ML-% IV SOLN
1000.0000 mg | INTRAVENOUS | Status: AC
  Administered 2014-10-11: 1000 mg via INTRAVENOUS
  Filled 2014-10-10: qty 200

## 2014-10-11 ENCOUNTER — Ambulatory Visit (HOSPITAL_COMMUNITY): Payer: Medicare Other | Admitting: Anesthesiology

## 2014-10-11 ENCOUNTER — Ambulatory Visit (HOSPITAL_COMMUNITY): Payer: Medicare Other | Admitting: Vascular Surgery

## 2014-10-11 ENCOUNTER — Ambulatory Visit: Payer: Medicare Other | Admitting: Interventional Cardiology

## 2014-10-11 ENCOUNTER — Encounter (HOSPITAL_COMMUNITY): Payer: Self-pay | Admitting: *Deleted

## 2014-10-11 ENCOUNTER — Ambulatory Visit (HOSPITAL_COMMUNITY)
Admission: RE | Admit: 2014-10-11 | Discharge: 2014-10-13 | Disposition: A | Discharge status: Home / Self Care | Payer: Medicare Other | Source: Ambulatory Visit | Attending: General Surgery | Admitting: General Surgery

## 2014-10-11 ENCOUNTER — Encounter (HOSPITAL_COMMUNITY)
Admission: RE | Disposition: A | Discharge status: Home / Self Care | Payer: Self-pay | Source: Ambulatory Visit | Attending: General Surgery

## 2014-10-11 DIAGNOSIS — K439 Ventral hernia without obstruction or gangrene: Secondary | ICD-10-CM | POA: Diagnosis not present

## 2014-10-11 HISTORY — PX: INSERTION OF MESH: SHX5868

## 2014-10-11 HISTORY — PX: VENTRAL HERNIA REPAIR: SHX424

## 2014-10-11 HISTORY — PX: LAPAROSCOPY: SHX197

## 2014-10-11 LAB — GLUCOSE, CAPILLARY
GLUCOSE-CAPILLARY: 128 mg/dL — AB (ref 70–99)
Glucose-Capillary: 195 mg/dL — ABNORMAL HIGH (ref 70–99)

## 2014-10-11 SURGERY — INSERTION OF MESH
Anesthesia: General | Site: Abdomen

## 2014-10-11 MED ORDER — METOPROLOL SUCCINATE ER 50 MG PO TB24
50.0000 mg | ORAL_TABLET | Freq: Every day | ORAL | Status: DC
  Administered 2014-10-12 – 2014-10-13 (×2): 50 mg via ORAL
  Filled 2014-10-11 (×2): qty 1

## 2014-10-11 MED ORDER — LIDOCAINE HCL (CARDIAC) 20 MG/ML IV SOLN
INTRAVENOUS | Status: DC | PRN
  Administered 2014-10-11: 80 mg via INTRAVENOUS

## 2014-10-11 MED ORDER — LATANOPROST 0.005 % OP SOLN
1.0000 [drp] | Freq: Every day | OPHTHALMIC | Status: DC
  Administered 2014-10-11 – 2014-10-12 (×2): 1 [drp] via OPHTHALMIC
  Filled 2014-10-11: qty 2.5

## 2014-10-11 MED ORDER — POTASSIUM CHLORIDE ER 10 MEQ PO TBCR: 10.0000 meq | EXTENDED_RELEASE_TABLET | Freq: Every day | ORAL | Status: DC

## 2014-10-11 MED ORDER — PANTOPRAZOLE SODIUM 40 MG PO TBEC
40.0000 mg | DELAYED_RELEASE_TABLET | Freq: Every day | ORAL | Status: DC
  Administered 2014-10-11 – 2014-10-13 (×3): 40 mg via ORAL
  Filled 2014-10-11 (×3): qty 1

## 2014-10-11 MED ORDER — METFORMIN HCL 500 MG PO TABS
1000.0000 mg | ORAL_TABLET | Freq: Every day | ORAL | Status: DC
  Filled 2014-10-11 (×2): qty 2

## 2014-10-11 MED ORDER — ONDANSETRON HCL 4 MG/2ML IJ SOLN
INTRAMUSCULAR | Status: DC | PRN
  Administered 2014-10-11: 4 mg via INTRAVENOUS

## 2014-10-11 MED ORDER — LIDOCAINE HCL (CARDIAC) 20 MG/ML IV SOLN
INTRAVENOUS | Status: AC
  Filled 2014-10-11: qty 5

## 2014-10-11 MED ORDER — HEPARIN SODIUM (PORCINE) 5000 UNIT/ML IJ SOLN
5000.0000 [IU] | Freq: Three times a day (TID) | INTRAMUSCULAR | Status: DC
  Administered 2014-10-12 – 2014-10-13 (×4): 5000 [IU] via SUBCUTANEOUS
  Filled 2014-10-11 (×7): qty 1

## 2014-10-11 MED ORDER — BUPIVACAINE-EPINEPHRINE (PF) 0.25% -1:200000 IJ SOLN
INTRAMUSCULAR | Status: AC
  Filled 2014-10-11: qty 30

## 2014-10-11 MED ORDER — FENTANYL CITRATE 0.05 MG/ML IJ SOLN
INTRAMUSCULAR | Status: AC
  Filled 2014-10-11: qty 2

## 2014-10-11 MED ORDER — ONDANSETRON HCL 4 MG/2ML IJ SOLN: 4.0000 mg | Freq: Four times a day (QID) | INTRAMUSCULAR | Status: DC | PRN

## 2014-10-11 MED ORDER — OXYCODONE HCL 5 MG PO TABS
ORAL_TABLET | ORAL | Status: AC
  Filled 2014-10-11: qty 1

## 2014-10-11 MED ORDER — BUPIVACAINE-EPINEPHRINE 0.25% -1:200000 IJ SOLN
INTRAMUSCULAR | Status: DC | PRN
  Administered 2014-10-11: 14 mL

## 2014-10-11 MED ORDER — ACETAMINOPHEN 325 MG PO TABS
325.0000 mg | ORAL_TABLET | Freq: Four times a day (QID) | ORAL | Status: DC | PRN
  Administered 2014-10-12: 325 mg via ORAL
  Filled 2014-10-11 (×2): qty 1

## 2014-10-11 MED ORDER — NEOSTIGMINE METHYLSULFATE 10 MG/10ML IV SOLN
INTRAVENOUS | Status: AC
  Filled 2014-10-11: qty 1

## 2014-10-11 MED ORDER — ROCURONIUM BROMIDE 50 MG/5ML IV SOLN
INTRAVENOUS | Status: AC
  Filled 2014-10-11: qty 1

## 2014-10-11 MED ORDER — OXYCODONE-ACETAMINOPHEN 5-325 MG PO TABS
1.0000 | ORAL_TABLET | ORAL | Status: DC | PRN
  Administered 2014-10-11 (×2): 2 via ORAL
  Administered 2014-10-12 – 2014-10-13 (×3): 1 via ORAL
  Filled 2014-10-11 (×2): qty 1
  Filled 2014-10-11: qty 2
  Filled 2014-10-11: qty 1
  Filled 2014-10-11: qty 2

## 2014-10-11 MED ORDER — ONDANSETRON HCL 4 MG/2ML IJ SOLN
4.0000 mg | Freq: Four times a day (QID) | INTRAMUSCULAR | Status: DC | PRN
  Administered 2014-10-11 – 2014-10-13 (×3): 4 mg via INTRAVENOUS
  Filled 2014-10-11 (×3): qty 2

## 2014-10-11 MED ORDER — POTASSIUM CHLORIDE CRYS ER 10 MEQ PO TBCR
10.0000 meq | EXTENDED_RELEASE_TABLET | Freq: Every day | ORAL | Status: DC
  Administered 2014-10-11 – 2014-10-13 (×3): 10 meq via ORAL
  Filled 2014-10-11 (×3): qty 1

## 2014-10-11 MED ORDER — DEXAMETHASONE SODIUM PHOSPHATE 4 MG/ML IJ SOLN
INTRAMUSCULAR | Status: DC | PRN
  Administered 2014-10-11: 8 mg via INTRAVENOUS

## 2014-10-11 MED ORDER — GLYCOPYRROLATE 0.2 MG/ML IJ SOLN
INTRAMUSCULAR | Status: AC
  Filled 2014-10-11: qty 2

## 2014-10-11 MED ORDER — ROCURONIUM BROMIDE 100 MG/10ML IV SOLN
INTRAVENOUS | Status: DC | PRN
  Administered 2014-10-11: 30 mg via INTRAVENOUS
  Administered 2014-10-11: 10 mg via INTRAVENOUS

## 2014-10-11 MED ORDER — GLYCOPYRROLATE 0.2 MG/ML IJ SOLN
INTRAMUSCULAR | Status: DC | PRN
  Administered 2014-10-11 (×3): 0.2 mg via INTRAVENOUS

## 2014-10-11 MED ORDER — ONDANSETRON HCL 4 MG PO TABS
4.0000 mg | ORAL_TABLET | Freq: Four times a day (QID) | ORAL | Status: DC | PRN
  Administered 2014-10-12: 4 mg via ORAL
  Filled 2014-10-11: qty 1

## 2014-10-11 MED ORDER — HYDROCHLOROTHIAZIDE 12.5 MG PO CAPS: 12.5000 mg | ORAL_CAPSULE | Freq: Every day | ORAL | Status: DC

## 2014-10-11 MED ORDER — FERROUS FUMARATE 325 (106 FE) MG PO TABS
1.0000 | ORAL_TABLET | Freq: Every day | ORAL | Status: DC
  Administered 2014-10-13: 106 mg via ORAL
  Filled 2014-10-11 (×4): qty 1

## 2014-10-11 MED ORDER — 0.9 % SODIUM CHLORIDE (POUR BTL) OPTIME
TOPICAL | Status: DC | PRN
  Administered 2014-10-11: 1000 mL

## 2014-10-11 MED ORDER — OXYCODONE HCL 5 MG/5ML PO SOLN: 5.0000 mg | Freq: Once | ORAL | Status: AC | PRN

## 2014-10-11 MED ORDER — SIMVASTATIN 20 MG PO TABS
20.0000 mg | ORAL_TABLET | Freq: Every evening | ORAL | Status: DC
  Administered 2014-10-11 – 2014-10-12 (×2): 20 mg via ORAL
  Filled 2014-10-11 (×3): qty 1

## 2014-10-11 MED ORDER — MORPHINE SULFATE 2 MG/ML IJ SOLN
2.0000 mg | INTRAMUSCULAR | Status: DC | PRN
  Administered 2014-10-11: 2 mg via INTRAVENOUS
  Filled 2014-10-11: qty 1

## 2014-10-11 MED ORDER — NEOSTIGMINE METHYLSULFATE 10 MG/10ML IV SOLN
INTRAVENOUS | Status: DC | PRN
  Administered 2014-10-11: 3 mg via INTRAVENOUS

## 2014-10-11 MED ORDER — OXYCODONE-ACETAMINOPHEN 5-325 MG PO TABS
ORAL_TABLET | ORAL | Status: AC
  Filled 2014-10-11: qty 1

## 2014-10-11 MED ORDER — FENTANYL CITRATE 0.05 MG/ML IJ SOLN
INTRAMUSCULAR | Status: AC
  Filled 2014-10-11: qty 5

## 2014-10-11 MED ORDER — PROPOFOL 10 MG/ML IV BOLUS
INTRAVENOUS | Status: DC | PRN
  Administered 2014-10-11: 120 mg via INTRAVENOUS

## 2014-10-11 MED ORDER — CHLORHEXIDINE GLUCONATE 4 % EX LIQD
1.0000 "application " | Freq: Once | CUTANEOUS | Status: DC
  Filled 2014-10-11: qty 15

## 2014-10-11 MED ORDER — FENTANYL CITRATE 0.05 MG/ML IJ SOLN
25.0000 ug | INTRAMUSCULAR | Status: DC | PRN
  Administered 2014-10-11 (×5): 25 ug via INTRAVENOUS

## 2014-10-11 MED ORDER — ONDANSETRON HCL 4 MG/2ML IJ SOLN
INTRAMUSCULAR | Status: AC
  Filled 2014-10-11: qty 2

## 2014-10-11 MED ORDER — FENTANYL CITRATE 0.05 MG/ML IJ SOLN
INTRAMUSCULAR | Status: DC | PRN
  Administered 2014-10-11 (×5): 50 ug via INTRAVENOUS

## 2014-10-11 MED ORDER — ISOSORBIDE MONONITRATE ER 60 MG PO TB24
60.0000 mg | ORAL_TABLET | Freq: Every evening | ORAL | Status: DC
  Administered 2014-10-11 – 2014-10-12 (×2): 60 mg via ORAL
  Filled 2014-10-11 (×3): qty 1

## 2014-10-11 MED ORDER — PROPOFOL 10 MG/ML IV BOLUS
INTRAVENOUS | Status: AC
  Filled 2014-10-11: qty 20

## 2014-10-11 MED ORDER — HYDROCHLOROTHIAZIDE 25 MG PO TABS
25.0000 mg | ORAL_TABLET | Freq: Every day | ORAL | Status: DC
Start: 2014-10-12 — End: 2014-10-13
  Administered 2014-10-12 – 2014-10-13 (×2): 25 mg via ORAL
  Filled 2014-10-11 (×2): qty 1

## 2014-10-11 MED ORDER — NITROGLYCERIN 0.4 MG SL SUBL: 0.4000 mg | SUBLINGUAL_TABLET | SUBLINGUAL | Status: DC | PRN

## 2014-10-11 MED ORDER — OXYCODONE HCL 5 MG PO TABS
5.0000 mg | ORAL_TABLET | Freq: Once | ORAL | Status: AC | PRN
  Administered 2014-10-11: 5 mg via ORAL

## 2014-10-11 MED ORDER — LACTATED RINGERS IV SOLN
INTRAVENOUS | Status: DC | PRN
  Administered 2014-10-11: 07:00:00 via INTRAVENOUS

## 2014-10-11 MED ORDER — KCL IN DEXTROSE-NACL 20-5-0.9 MEQ/L-%-% IV SOLN
INTRAVENOUS | Status: DC
  Administered 2014-10-11: 15:00:00 via INTRAVENOUS
  Filled 2014-10-11: qty 1000

## 2014-10-11 MED ORDER — METOPROLOL SUCCINATE ER 50 MG PO TB24
50.0000 mg | ORAL_TABLET | Freq: Once | ORAL | Status: AC
  Administered 2014-10-11: 50 mg via ORAL
  Filled 2014-10-11: qty 1

## 2014-10-11 MED ORDER — AMLODIPINE BESYLATE 10 MG PO TABS
10.0000 mg | ORAL_TABLET | Freq: Every evening | ORAL | Status: DC
Start: 2014-10-11 — End: 2014-10-13
  Administered 2014-10-11 – 2014-10-12 (×2): 10 mg via ORAL
  Filled 2014-10-11 (×3): qty 1

## 2014-10-11 MED ORDER — ADULT MULTIVITAMIN W/MINERALS CH
1.0000 | ORAL_TABLET | Freq: Every day | ORAL | Status: DC
  Administered 2014-10-13: 1 via ORAL
  Filled 2014-10-11 (×2): qty 1

## 2014-10-11 MED ORDER — DEXAMETHASONE SODIUM PHOSPHATE 4 MG/ML IJ SOLN
INTRAMUSCULAR | Status: AC
  Filled 2014-10-11: qty 2

## 2014-10-11 SURGICAL SUPPLY — 55 items
ADH SKN CLS APL DERMABOND .7 (GAUZE/BANDAGES/DRESSINGS) ×3
APPLIER CLIP LOGIC TI 5 (MISCELLANEOUS) IMPLANT
APPLIER CLIP ROT 10 11.4 M/L (STAPLE)
APR CLP MED LRG 11.4X10 (STAPLE)
APR CLP MED LRG 33X5 (MISCELLANEOUS)
BINDER ABD UNIV 12 45-62 (WOUND CARE) IMPLANT
BINDER ABDOMINAL 46IN 62IN (WOUND CARE)
BNDG GAUZE ELAST 4 BULKY (GAUZE/BANDAGES/DRESSINGS) IMPLANT
CANISTER SUCTION 2500CC (MISCELLANEOUS) IMPLANT
CHLORAPREP W/TINT 26ML (MISCELLANEOUS) ×5 IMPLANT
CLIP APPLIE ROT 10 11.4 M/L (STAPLE) IMPLANT
COVER SURGICAL LIGHT HANDLE (MISCELLANEOUS) ×5 IMPLANT
DERMABOND ADVANCED (GAUZE/BANDAGES/DRESSINGS) ×2
DERMABOND ADVANCED .7 DNX12 (GAUZE/BANDAGES/DRESSINGS) ×3 IMPLANT
DEVICE SECURE STRAP 25 ABSORB (INSTRUMENTS) ×5 IMPLANT
DEVICE TROCAR PUNCTURE CLOSURE (ENDOMECHANICALS) ×5 IMPLANT
DRAPE INCISE IOBAN 66X45 STRL (DRAPES) ×5 IMPLANT
DRAPE LAPAROSCOPIC ABDOMINAL (DRAPES) ×5 IMPLANT
DRAPE UTILITY 15X26 W/TAPE STR (DRAPE) ×10 IMPLANT
ELECT CAUTERY BLADE 6.4 (BLADE) ×5 IMPLANT
ELECT REM PT RETURN 9FT ADLT (ELECTROSURGICAL) ×5
ELECTRODE REM PT RTRN 9FT ADLT (ELECTROSURGICAL) ×3 IMPLANT
GLOVE BIO SURGEON STRL SZ7.5 (GLOVE) ×5 IMPLANT
GLOVE BIOGEL PI IND STRL 7.0 (GLOVE) ×2 IMPLANT
GLOVE BIOGEL PI INDICATOR 7.0 (GLOVE) ×4
GLOVE SS BIOGEL STRL SZ 6.5 (GLOVE) ×1 IMPLANT
GLOVE SUPERSENSE BIOGEL SZ 6.5 (GLOVE) ×2
GLOVE SURG SS PI 7.0 STRL IVOR (GLOVE) ×3 IMPLANT
GOWN STRL REUS W/ TWL LRG LVL3 (GOWN DISPOSABLE) ×9 IMPLANT
GOWN STRL REUS W/TWL LRG LVL3 (GOWN DISPOSABLE) ×15
KIT BASIN OR (CUSTOM PROCEDURE TRAY) ×5 IMPLANT
KIT ROOM TURNOVER OR (KITS) ×5 IMPLANT
MARKER SKIN DUAL TIP RULER LAB (MISCELLANEOUS) ×5 IMPLANT
MESH VENTRALEX ST 2.5 CRC MED (Mesh General) ×3 IMPLANT
NDL SPNL 22GX3.5 QUINCKE BK (NEEDLE) ×2 IMPLANT
NEEDLE SPNL 22GX3.5 QUINCKE BK (NEEDLE) ×5 IMPLANT
NS IRRIG 1000ML POUR BTL (IV SOLUTION) ×5 IMPLANT
PAD ARMBOARD 7.5X6 YLW CONV (MISCELLANEOUS) ×10 IMPLANT
PENCIL BUTTON HOLSTER BLD 10FT (ELECTRODE) ×5 IMPLANT
SCALPEL HARMONIC ACE (MISCELLANEOUS) IMPLANT
SCISSORS LAP 5X35 DISP (ENDOMECHANICALS) IMPLANT
SET IRRIG TUBING LAPAROSCOPIC (IRRIGATION / IRRIGATOR) IMPLANT
SLEEVE ENDOPATH XCEL 5M (ENDOMECHANICALS) ×5 IMPLANT
SUT MNCRL AB 4-0 PS2 18 (SUTURE) ×5 IMPLANT
SUT NOVA NAB DX-16 0-1 5-0 T12 (SUTURE) ×5 IMPLANT
SUT VIC AB 2-0 SH 27 (SUTURE) ×5
SUT VIC AB 2-0 SH 27XBRD (SUTURE) ×1 IMPLANT
TOWEL OR 17X24 6PK STRL BLUE (TOWEL DISPOSABLE) ×5 IMPLANT
TOWEL OR 17X26 10 PK STRL BLUE (TOWEL DISPOSABLE) ×5 IMPLANT
TRAY FOLEY CATH 16FR SILVER (SET/KITS/TRAYS/PACK) ×5 IMPLANT
TRAY LAPAROSCOPIC (CUSTOM PROCEDURE TRAY) ×5 IMPLANT
TROCAR XCEL BLUNT TIP 100MML (ENDOMECHANICALS) IMPLANT
TROCAR XCEL NON-BLD 11X100MML (ENDOMECHANICALS) IMPLANT
TROCAR XCEL NON-BLD 5MMX100MML (ENDOMECHANICALS) ×5 IMPLANT
TUBING INSUFFLATION (TUBING) ×5 IMPLANT

## 2014-10-11 NOTE — Transfer of Care (Signed)
Immediate Anesthesia Transfer of Care Note  Patient: Kenneth [name]  Procedure(s) Performed: Procedure(s): INSERTION OF MESH (N/A) LAPAROSCOPY DIAGNOSTIC HERNIA REPAIR VENTRAL ADULT  Patient Location: PACU  Anesthesia Type:General  Level of Consciousness: awake, alert , oriented and patient cooperative  Airway & Oxygen Therapy: Patient Spontanous Breathing and Patient connected to nasal cannula oxygen  Post-op Assessment: Report given to PACU RN and Post -op Vital signs reviewed and stable  Post vital signs: Reviewed and stable  Complications: No apparent anesthesia complications

## 2014-10-11 NOTE — Op Note (Signed)
10/11/2014  8:30 AM  PATIENT:  Kenneth [name]  80 y.o. male  PRE-OPERATIVE DIAGNOSIS:  Ventral Hernia  POST-OPERATIVE DIAGNOSIS:  Ventral Hernia  PROCEDURE:  Procedure(s): INSERTION OF MESH (N/A) LAPAROSCOPY DIAGNOSTIC HERNIA REPAIR VENTRAL ADULT  SURGEON:  Surgeon(s) and Role:    * Jovita Kussmaul, MD - Primary  PHYSICIAN ASSISTANT:   ASSISTANTS: none   ANESTHESIA:   general  EBL:  Total I/O In: -  Out: 340 [Urine:300; Blood:40]  BLOOD ADMINISTERED:none  DRAINS: none   LOCAL MEDICATIONS USED:  MARCAINE     SPECIMEN:  No Specimen  DISPOSITION OF SPECIMEN:  N/A  COUNTS:  YES  TOURNIQUET:  * No tourniquets in log *  DICTATION: .Dragon Dictation  After informed consent was obtained the patient was brought to the operating room and placed in the supine position on the operating room table. After adequate induction of general anesthesia the patient's abdomen was prepped with ChloraPrep, allowed to dry, and draped in usual sterile manner. A site was chosen on the right mid abdomen for access and the abdominal cavity. This area was infiltrated with Marcaine. A small stab incision was made with a 15 blade knife. A 5 mm Optiview port and camera were used to bluntly dissect through the layers of the abdominal wall under direct vision until access was gained to the abdominal cavity. The abdomen was then insufflated with carbon dioxide without difficulty. The camera was used to examine the abdominal cavity. In the left mid abdomen there was a small hernia defect identified. It measured about 2 cm in diameter. Because of health for lateral the hernia defect was it was not very far away from the lateral attachment of the colon to the abdominal wall. I decided to fix the hernia with a medium sized piece of circular mesh that we used to fix an umbilical hernia with. At this point we cut straight down on the hernia with a 15 blade knife on the left abdomen. The incision was carried through  the skin and subcutaneous tissue sharply with electrocautery until the hernia was opened. The hernia sac was excised sharply with the electrocautery. The fascial layers were grasped with Coker clamps. The piece of mesh was placed through this opening into the abdominal cavity and then pulled up against the abdominal wall with the anchors. The deep layer of fascia was closed with interrupted #1 Novafil stitches incorporating the anchors of the mesh. Next the superficial fascial layer was closed with interrupted 0 Vicryl stitches. The wound was irrigated with saline. The subcutaneous tissue was closed with interrupted 2-0 Vicryl stitches. The skin was then closed with a running 4-0 Monocryl subcuticular stitch. Dermabond dressings were applied. The port site on the right abdomen was closed with an interrupted 4-0 Monocryl subcuticular stitch and also closed with a Dermabond dressing. The patient tolerated the procedure well. At the end of the case all needle sponge and instrument counts were correct. The patient was then awakened and taken to recovery in stable condition.  PLAN OF CARE: Admit for overnight observation  PATIENT DISPOSITION:  PACU - hemodynamically stable.   Delay start of Pharmacological VTE agent (>24hrs) due to surgical blood loss or risk of bleeding: no

## 2014-10-11 NOTE — Anesthesia Postprocedure Evaluation (Signed)
Anesthesia Post Note  Patient: Kenneth [name]  Procedure(s) Performed: Procedure(s) (LRB): INSERTION OF MESH (N/A) LAPAROSCOPY DIAGNOSTIC HERNIA REPAIR VENTRAL ADULT  Anesthesia type: General  Patient location: PACU  Post pain: Pain level controlled and Adequate analgesia  Post assessment: Post-op Vital signs reviewed, Patient's Cardiovascular Status Stable, Respiratory Function Stable, Patent Airway and Pain level controlled  Last Vitals:  Filed Vitals:   10/11/14 1032  BP: 144/59  Pulse: 54  Temp:   Resp: 15    Post vital signs: Reviewed and stable  Level of consciousness: awake, alert  and oriented  Complications: No apparent anesthesia complications

## 2014-10-11 NOTE — Interval H&P Note (Signed)
History and Physical Interval Note:  10/11/2014 7:00 AM  Kenneth [name]  has presented today for surgery, with the diagnosis of Ventral Hernia  The various methods of treatment have been discussed with the patient and family. After consideration of risks, benefits and other options for treatment, the patient has consented to  Procedure(s): LAPAROSCOPIC VENTRAL HERNIA (N/A) INSERTION OF MESH (N/A) as a surgical intervention .  The patient's history has been reviewed, patient examined, no change in status, stable for surgery.  I have reviewed the patient's chart and labs.  Questions were answered to the patient's satisfaction.     TOTH III,Kailene Steinhart S

## 2014-10-11 NOTE — H&P (View-Only) (Signed)
Kenneth [name] 09/24/2014 11:29 AM Location: [facility] Patient #: [id number] DOB: Oct 14, 1928 Married / Language: Kenneth [name] / Race: White Male  History of Present Illness Kenneth [name]. Kenneth Starks MD; 09/24/2014 12:53 PM) Patient words: recheck hernia.  The patient is a 80 year old male who presents for a follow-up for abdominal pain. The patient is an 80 year old white male who is over 2 months out from a left inguinal hernia repair with mesh. He has been complaining of some pain and swelling in his left mid abdomen. He denies any nausea or vomiting. He denies any fevers or chills. He does intermittently noticed a bulge in this location. He states that he has enough pain in this area that his quality of life is poor. He would like to have this fixed.Since his last visit he underwent a CT scan which did show a spigelian hernia in this location. There did not appear to be bowel protruding through the defect.   Review of Systems Kenneth [name] S. Kenneth Starks MD; 09/24/2014 12:53 PM) General Not Present- Appetite Loss, Chills, Fatigue, Fever, Night Sweats, Weight Gain and Weight Loss. Skin Not Present- Change in Wart/Mole, Dryness, Hives, Jaundice, New Lesions, Non-Healing Wounds, Rash and Ulcer. HEENT Not Present- Earache, Hearing Loss, Hoarseness, Nose Bleed, Oral Ulcers, Ringing in the Ears, Seasonal Allergies, Sinus Pain, Sore Throat, Visual Disturbances, Wears glasses/contact lenses and Yellow Eyes. Respiratory Not Present- Bloody sputum, Chronic Cough, Difficulty Breathing, Snoring and Wheezing. Breast Not Present- Breast Mass, Breast Pain, Nipple Discharge and Skin Changes. Cardiovascular Not Present- Chest Pain, Difficulty Breathing Lying Down, Leg Cramps, Palpitations, Rapid Heart Rate, Shortness of Breath and Swelling of Extremities. Gastrointestinal Present- Abdominal Pain. Not Present- Bloating, Bloody Stool, Change in Bowel Habits, Chronic diarrhea, Constipation, Difficulty Swallowing, Excessive gas,  Gets full quickly at meals, Hemorrhoids, Indigestion, Nausea, Rectal Pain and Vomiting. Musculoskeletal Not Present- Back Pain, Joint Pain, Joint Stiffness, Muscle Pain, Muscle Weakness and Swelling of Extremities. Neurological Not Present- Decreased Memory, Fainting, Headaches, Numbness, Seizures, Tingling, Tremor, Trouble walking and Weakness. Psychiatric Not Present- Anxiety, Bipolar, Change in Sleep Pattern, Depression, Fearful and Frequent crying. Endocrine Not Present- Cold Intolerance, Excessive Hunger, Hair Changes, Heat Intolerance and New Diabetes. Hematology Not Present- Easy Bruising, Excessive bleeding, Gland problems, HIV and Persistent Infections.   Vitals Kenneth [name] CMA; 09/24/2014 11:30 AM) 09/24/2014 11:30 AM Weight: 159 lb Height: 70in Body Surface Area: 1.89 m Body Mass Index: 22.81 kg/m Temp.: 99.73F  Pulse: 86 (Regular)  BP: 150/72 (Sitting, Left Arm, Standard)    Physical Exam Kenneth [name] S. Kenneth Starks MD; 09/24/2014 12:54 PM) General Mental Status-Alert. General Appearance-Consistent with stated age. Hydration-Well hydrated. Voice-Normal.  Head and Neck Head-normocephalic, atraumatic with no lesions or palpable masses. Trachea-midline. Thyroid Gland Characteristics - normal size and consistency.  Eye Eyeball - Bilateral-Extraocular movements intact. Sclera/Conjunctiva - Bilateral-No scleral icterus.  Chest and Lung Exam Chest and lung exam reveals -quiet, even and easy respiratory effort with no use of accessory muscles and on auscultation, normal breath sounds, no adventitious sounds and normal vocal resonance. Inspection Chest Wall - Normal. Back - normal.  Breast Breast - Left-Symmetric, Non Tender, No Biopsy scars, no Dimpling, No Inflammation, No Lumpectomy scars, No Mastectomy scars, No Peau d' Orange. Breast - Right-Symmetric, Non Tender, No Biopsy scars, no Dimpling, No Inflammation, No Lumpectomy scars, No  Mastectomy scars, No Peau d' Orange. Breast Lump-No Palpable Breast Mass.  Cardiovascular Cardiovascular examination reveals -normal heart sounds, regular rate and rhythm with no murmurs and normal pedal pulses bilaterally.  Abdomen Note: The abdomen is soft with minimal tenderness. There is a palpable bulge on the left mid abdomen that does not reduce. There is no sign of obstruction.   Neurologic Neurologic evaluation reveals -alert and oriented x 3 with no impairment of recent or remote memory. Mental Status-Normal.  Musculoskeletal Normal Exam - Left-Upper Extremity Strength Normal and Lower Extremity Strength Normal. Normal Exam - Right-Upper Extremity Strength Normal and Lower Extremity Strength Normal.  Lymphatic Head & Neck  General Head & Neck Lymphatics: Bilateral - Description - Normal. Axillary  General Axillary Region: Bilateral - Description - Normal. Tenderness - Non Tender. Femoral & Inguinal  Generalized Femoral & Inguinal Lymphatics: Bilateral - Description - Normal. Tenderness - Non Tender.    Assessment & Plan Kenneth [name] S. Kenneth Starks MD; 09/24/2014 12:15 PM) VENTRAL HERNIA (553.20  K43.9) VENTRAL HERNIA WITHOUT OBSTRUCTION OR GANGRENE (553.20  K43.9) Impression: The patient appears to have a spigellian hernia on the left mid abdomen. He is relatively symptomatic from it. Because of the risk of incarceration and strangulation I think he would benefit from having the hernia fixed. I think he is a good laparoscopic candidate. I discussed with him in detail the risks and benefits of the operation to fix the hernia as well some of the technical aspects and he understands and wishes to proceed     Signed by Kenneth Cook, MD (09/24/2014 12:54 PM)

## 2014-10-11 NOTE — Anesthesia Preprocedure Evaluation (Signed)
Anesthesia Evaluation  Patient identified by MRN, date of birth, ID band Patient awake    Reviewed: Allergy & Precautions, H&P , NPO status , Patient's Chart, lab work & pertinent test results  History of Anesthesia Complications (+) PONV and history of anesthetic complications  Airway Mallampati: II   Neck ROM: full    Dental   Pulmonary Current Smoker,          Cardiovascular hypertension, + angina + CAD and + CABG + dysrhythmias     Neuro/Psych    GI/Hepatic hiatal hernia, GERD-  ,  Endo/Other  diabetes, Type 2  Renal/GU      Musculoskeletal  (+) Arthritis -,   Abdominal   Peds  Hematology   Anesthesia Other Findings   Reproductive/Obstetrics                             Anesthesia Physical Anesthesia Plan  ASA: III  Anesthesia Plan: General   Post-op Pain Management:    Induction: Intravenous  Airway Management Planned: Oral ETT  Additional Equipment:   Intra-op Plan:   Post-operative Plan: Extubation in OR  Informed Consent: I have reviewed the patients History and Physical, chart, labs and discussed the procedure including the risks, benefits and alternatives for the proposed anesthesia with the patient or authorized representative who has indicated his/her understanding and acceptance.     Plan Discussed with: CRNA, Anesthesiologist and Surgeon  Anesthesia Plan Comments:         Anesthesia Quick Evaluation

## 2014-10-12 DIAGNOSIS — K439 Ventral hernia without obstruction or gangrene: Secondary | ICD-10-CM | POA: Diagnosis not present

## 2014-10-12 MED ORDER — GLUCERNA SHAKE PO LIQD
237.0000 mL | Freq: Two times a day (BID) | ORAL | Status: DC
  Administered 2014-10-12 – 2014-10-13 (×2): 237 mL via ORAL

## 2014-10-12 MED ORDER — KETOROLAC TROMETHAMINE 30 MG/ML IJ SOLN
30.0000 mg | Freq: Three times a day (TID) | INTRAMUSCULAR | Status: AC
  Administered 2014-10-12 (×3): 30 mg via INTRAVENOUS
  Filled 2014-10-12 (×4): qty 1

## 2014-10-12 NOTE — Progress Notes (Signed)
1 Day Post-Op  Subjective: Complains of pain at the incision and nausea  Objective: Vital signs in last 24 hours: Temp:  [97.5 F (36.4 C)-98.8 F (37.1 C)] 97.8 F (36.6 C) (11/07 0627) Pulse Rate:  [46-107] 77 (11/07 0627) Resp:  [11-20] 18 (11/07 0627) BP: (96-176)/(51-84) 156/84 mmHg (11/07 0627) SpO2:  [93 %-100 %] 94 % (11/07 0627) Last BM Date: 10/11/14  Intake/Output from previous day: 11/06 0701 - 11/07 0700 In: 1183.7 [P.O.:240; I.V.:943.7] Out: 4315 [Urine:1476; Blood:40] Intake/Output this shift:    Resp: clear to auscultation bilaterally Cardio: regular rate and rhythm GI: soft, good bs. incision looks good  Lab Results:  No results for input(s): WBC, HGB, HCT, PLT in the last 72 hours. BMET No results for input(s): NA, K, CL, CO2, GLUCOSE, BUN, CREATININE, CALCIUM in the last 72 hours. PT/INR No results for input(s): LABPROT, INR in the last 72 hours. ABG No results for input(s): PHART, HCO3 in the last 72 hours.  Invalid input(s): PCO2, PO2  Studies/Results: No results found.  Anti-infectives: Anti-infectives    Start     Dose/Rate Route Frequency Ordered Stop   10/11/14 0600  vancomycin (VANCOCIN) IVPB 1000 mg/200 mL premix     1,000 mg200 mL/hr over 60 Minutes Intravenous On call to O.R. 10/10/14 1441 10/11/14 0825      Assessment/Plan: s/p Procedure(s): INSERTION OF MESH (N/A) LAPAROSCOPY DIAGNOSTIC HERNIA REPAIR VENTRAL ADULT Advance diet  Add toradol for pain Hopefully will be ready to go tomorrow  LOS: 1 day    TOTH III,Danyella Mcginty S 10/12/2014

## 2014-10-12 NOTE — Progress Notes (Signed)
INITIAL NUTRITION ASSESSMENT  DOCUMENTATION CODES Per approved criteria  -Not Applicable   INTERVENTION: Glucerna Shake po BID, each supplement provides 220 kcal and 10 grams of protein  NUTRITION DIAGNOSIS: Inadequate oral intake related to pain from hernia as evidenced by reported wt loss and poor appetote.   Goal: Pt to meet >/= 90% of their estimated nutrition needs   Monitor:  Weight trend, po intake, acceptance of supplements, labs  Reason for Assessment: MST  80 y.o. male  Admitting Dx: <principal problem not specified>  ASSESSMENT: The patient is an 80 year old white male who is over 2 months out from a left inguinal hernia repair with mesh. He has been complaining of some pain and swelling in his left mid abdomen. He denies any nausea or vomiting. He denies any fevers or chills. He does intermittently noticed a bulge in this location. He states that he has enough pain in this area that his quality of life is poor. He would like to have this fixed.  11/6 s/p: PROCEDURE: Procedure(s): INSERTION OF MESH (N/A) LAPAROSCOPY DIAGNOSTIC HERNIA REPAIR VENTRAL ADULT  - Pt reports that he has lost weight over the past year. He says that his appetite has been poor due to pain from hernia. Pt's appetite is starting to improve and he has been drinking lots of juice. He says that he feels his appetite will improve once he is out of the hospital. Agreed to try nutritional supplements.    Height: Ht Readings from Last 1 Encounters:  10/11/14 5\' 11"  (1.803 m)    Weight: Wt Readings from Last 1 Encounters:  10/11/14 162 lb (73.483 kg)    Ideal Body Weight: 75.3 kg  % Ideal Body Weight: 98%  Wt Readings from Last 10 Encounters:  10/11/14 162 lb (73.483 kg)  10/07/14 162 lb (73.483 kg)  10/04/14 162 lb 11.2 oz (73.8 kg)  07/22/14 160 lb 9.6 oz (72.848 kg)  06/29/14 166 lb 3.6 oz (75.4 kg)  09/28/13 163 lb 12.8 oz (74.299 kg)    BMI:  Body mass index is 22.6  kg/(m^2).  Estimated Nutritional Needs: Kcal: 1850-2100 Protein: 105-120 g Fluid: 1.9-2.1 L/day  Skin: closed incision on abdomen  Diet Order: Diet full liquid  EDUCATION NEEDS: -Education needs addressed   Intake/Output Summary (Last 24 hours) at 10/12/14 1211 Last data filed at 10/12/14 0530  Gross per 24 hour  Intake 383.67 ml  Output   1176 ml  Net -792.33 ml    Last BM: prior to admission   Labs:  No results for input(s): NA, K, CL, CO2, BUN, CREATININE, CALCIUM, MG, PHOS, GLUCOSE in the last 168 hours.  CBG (last 3)   Recent Labs  10/11/14 0617 10/11/14 0849  GLUCAP 128* 195*    Scheduled Meds: . amLODipine  10 mg Oral QPM  . ferrous fumarate  1 tablet Oral Daily  . heparin subcutaneous  5,000 Units Subcutaneous 3 times per day  . hydrochlorothiazide  25 mg Oral Daily  . isosorbide mononitrate  60 mg Oral QPM  . ketorolac  30 mg Intravenous 3 times per day  . latanoprost  1 drop Both Eyes QHS  . metFORMIN  1,000 mg Oral Q breakfast  . metoprolol succinate  50 mg Oral Daily  . multivitamin with minerals  1 tablet Oral Daily  . pantoprazole  40 mg Oral Daily  . potassium chloride  10 mEq Oral Daily  . simvastatin  20 mg Oral QPM    Continuous Infusions: . dextrose  5 % and 0.9 % NaCl with KCl 20 mEq/L 10 mL/hr at 10/12/14 0530    Past Medical History  Diagnosis Date  . Diabetes   . HTN (hypertension)   . Coronary atherosclerosis of unspecified type of vessel, native or graft     CABG 1993, LIMA to LAD, SVG to obtuse marginal one, SVG to posterior descending, stable angina  . Hyperlipidemia `  . GERD (gastroesophageal reflux disease)   . Chronic rhinitis   . Glaucoma   . Melanoma of skin, site unspecified   . Hernia     left side  . Complication of anesthesia   . PONV (postoperative nausea and vomiting)   . Anemia     on iron  . [facility] spotted fever 2-3 yrs ago  . CAD (coronary artery disease)   . Diverticulosis of colon   .  Anginal pain     last 3 days ago goes away if stop and rest  . Dysrhythmia     occ "skips a beat"  . History of kidney stones   . H/O hiatal hernia   . Arthritis     Past Surgical History  Procedure Laterality Date  . Right leg fracture age 64    . Coronary artery bypass graft  1990's    x 3  . Tonsillectomy  age 34 or 79  . Colonoscopy with propofol N/A 11/27/2013    Procedure: COLONOSCOPY WITH PROPOFOL;  Surgeon: Garlan Fair, MD;  Location: [facility];  Service: Endoscopy;  Laterality: N/A;  . Inguinal hernia repair Left 06/28/2014    Procedure: Left  INGUINAL HERNIA REPAIR ;  Surgeon: Merrie Roof, MD;  Location: [facility];  Service: General;  Laterality: Left;  . Insertion of mesh Left 06/28/2014    Procedure: INSERTION OF MESH;  Surgeon: Merrie Roof, MD;  Location: [facility];  Service: General;  Laterality: Left;  . Hernia repair  4827'M,78    lft ing hernia    Laurette Schimke RD, LDN

## 2014-10-13 DIAGNOSIS — K439 Ventral hernia without obstruction or gangrene: Secondary | ICD-10-CM | POA: Diagnosis not present

## 2014-10-13 MED ORDER — ONDANSETRON HCL 4 MG PO TABS
4.0000 mg | ORAL_TABLET | Freq: Once | ORAL | Status: AC
  Administered 2014-10-13: 4 mg via ORAL
  Filled 2014-10-13: qty 1

## 2014-10-13 MED ORDER — OXYCODONE-ACETAMINOPHEN 5-325 MG PO TABS: 1.0000 | ORAL_TABLET | ORAL | Status: DC | PRN

## 2014-10-13 NOTE — Discharge Summary (Signed)
Physician Discharge Summary  Patient ID: Kenneth [name] MRN: [id number] DOB/AGE: 78-Jan-1929 78 y.o.  Admit date: 10/11/2014 Discharge date: 10/13/2014  Admission Diagnoses:  Discharge Diagnoses:  Active Problems:   Ventral hernia without obstruction or gangrene   Discharged Condition: good  Hospital Course: the pt underwent ventral hernia repair with mesh. Postop he had an ileus. By pod 2 this resolved and he is ready for discharge home  Consults: None  Significant Diagnostic Studies: none  Treatments: surgery: as above  Discharge Exam: Blood pressure 152/113, pulse 74, temperature 98.1 F (36.7 C), temperature source Oral, resp. rate 18, height 5\' 11"  (1.803 m), weight 162 lb (73.483 kg), SpO2 98 %. GI: soft, nontender  Disposition: 01-Home or Self Care  Discharge Instructions    Call MD for:  difficulty breathing, headache or visual disturbances    Complete by:  As directed      Call MD for:  extreme fatigue    Complete by:  As directed      Call MD for:  hives    Complete by:  As directed      Call MD for:  persistant dizziness or light-headedness    Complete by:  As directed      Call MD for:  persistant nausea and vomiting    Complete by:  As directed      Call MD for:  redness, tenderness, or signs of infection (pain, swelling, redness, odor or green/yellow discharge around incision site)    Complete by:  As directed      Call MD for:  severe uncontrolled pain    Complete by:  As directed      Diet - low sodium heart healthy    Complete by:  As directed      Discharge instructions    Complete by:  As directed   May shower. No heavy lifting. Low fat diet     Increase activity slowly    Complete by:  As directed      No wound care    Complete by:  As directed             Medication List    TAKE these medications        acetaminophen 325 MG tablet  Commonly known as:  TYLENOL  Take 325 mg by mouth every 6 (six) hours as needed for mild pain.     amLODipine 10 MG tablet  Commonly known as:  NORVASC  Take 10 mg by mouth every evening.     ferrous fumarate 325 (106 FE) MG Tabs tablet  Commonly known as:  HEMOCYTE - 106 mg FE  Take 1 tablet by mouth daily.     hydrochlorothiazide 25 MG tablet  Commonly known as:  HYDRODIURIL     isosorbide mononitrate 60 MG 24 hr tablet  Commonly known as:  IMDUR  Take 60 mg by mouth every evening.     latanoprost 0.005 % ophthalmic solution  Commonly known as:  XALATAN  Place 1 drop into both eyes at bedtime.     metFORMIN 1000 MG tablet  Commonly known as:  GLUCOPHAGE  Take 1,000 mg by mouth daily with breakfast.     metoprolol succinate 50 MG 24 hr tablet  Commonly known as:  TOPROL-XL  Take 50 mg by mouth daily. Take with or immediately following a meal.     multivitamin with minerals Tabs tablet  Take 1 tablet by mouth daily.     nitroGLYCERIN 0.4 MG  SL tablet  Commonly known as:  NITROSTAT  Place 0.4 mg under the tongue every 5 (five) minutes as needed for chest pain.     oxyCODONE-acetaminophen 5-325 MG per tablet  Commonly known as:  PERCOCET/ROXICET     oxyCODONE-acetaminophen 5-325 MG per tablet  Commonly known as:  ROXICET  Take 1-2 tablets by mouth every 4 (four) hours as needed.     pantoprazole 40 MG tablet  Commonly known as:  PROTONIX  Take 40 mg by mouth daily.     potassium chloride 10 MEQ tablet  Commonly known as:  K-DUR,KLOR-CON  Take 10 mEq by mouth daily.     potassium chloride 10 MEQ tablet  Commonly known as:  K-DUR     simvastatin 20 MG tablet  Commonly known as:  ZOCOR  Take 20 mg by mouth every evening.     VITAMIN B-12 PO  Take 1 tablet by mouth daily.           Follow-up Information    Follow up with Merrie Roof, MD In 2 weeks.   Specialty:  General Surgery   Contact information:   [address] [place] [address] [phone]       Signed: Merrie Roof 10/13/2014, 8:07 AM

## 2014-10-13 NOTE — Plan of Care (Signed)
Problem: Discharge Progression Outcomes Goal: Discharge plan in place and appropriate Outcome: Completed/Met Date Met:  10/13/14 Goal: Pain controlled with appropriate interventions Outcome: Completed/Met Date Met:  10/13/14 Goal: Hemodynamically stable Outcome: Completed/Met Date Met:  40/76/80 Goal: Complications resolved/controlled Outcome: Completed/Met Date Met:  10/13/14 Goal: Tolerating diet Outcome: Completed/Met Date Met:  10/13/14

## 2014-10-13 NOTE — Plan of Care (Signed)
Problem: Discharge Progression Outcomes Goal: Activity appropriate for discharge plan Outcome: Completed/Met Date Met:  10/13/14 Goal: Tubes and drains discontinued if indicated Outcome: Completed/Met Date Met:  10/13/14 Goal: Staples/sutures removed Outcome: Not Applicable Date Met:  93/81/01 Goal: Steri-Strips applied Outcome: Not Applicable Date Met:  75/10/25

## 2014-10-13 NOTE — Progress Notes (Signed)
2 Days Post-Op  Subjective: Feeling better. Able to tolerated food and pass flatus  Objective: Vital signs in last 24 hours: Temp:  [97.8 F (36.6 C)-98.1 F (36.7 C)] 98.1 F (36.7 C) (11/08 0556) Pulse Rate:  [55-74] 74 (11/08 0556) Resp:  [16-18] 18 (11/08 0556) BP: (148-153)/(61-113) 152/113 mmHg (11/08 0556) SpO2:  [98 %-100 %] 98 % (11/08 0556) Last BM Date: 10/12/14  Intake/Output from previous day: 11/07 0701 - 11/08 0700 In: 940 [P.O.:940] Out: 950 [Urine:950] Intake/Output this shift:    Resp: clear to auscultation bilaterally Cardio: regular rate and rhythm GI: soft, nontender  Lab Results:  No results for input(s): WBC, HGB, HCT, PLT in the last 72 hours. BMET No results for input(s): NA, K, CL, CO2, GLUCOSE, BUN, CREATININE, CALCIUM in the last 72 hours. PT/INR No results for input(s): LABPROT, INR in the last 72 hours. ABG No results for input(s): PHART, HCO3 in the last 72 hours.  Invalid input(s): PCO2, PO2  Studies/Results: No results found.  Anti-infectives: Anti-infectives    Start     Dose/Rate Route Frequency Ordered Stop   10/11/14 0600  vancomycin (VANCOCIN) IVPB 1000 mg/200 mL premix     1,000 mg200 mL/hr over 60 Minutes Intravenous On call to O.R. 10/10/14 1441 10/11/14 0825      Assessment/Plan: s/p Procedure(s): INSERTION OF MESH (N/A) LAPAROSCOPY DIAGNOSTIC HERNIA REPAIR VENTRAL ADULT Advance diet Discharge  LOS: 2 days    TOTH III,PAUL S 10/13/2014

## 2014-10-13 NOTE — Progress Notes (Signed)
Discharge instructions gone over with patient and spouse. Home medications gone over. Prescription given. Follow up appointment to be made. Diet, activity, signs and symptoms of infection, and incisional care gone over. My chart discussed. Reasons to call 911 discussed. Patient verbalized correct usage of nitroglycerine tablets and understanding of discharge instructions.

## 2014-10-15 ENCOUNTER — Encounter (HOSPITAL_COMMUNITY): Payer: Self-pay | Admitting: General Surgery

## 2014-11-03 ENCOUNTER — Other Ambulatory Visit: Payer: Self-pay | Admitting: Interventional Cardiology

## 2015-09-09 ENCOUNTER — Emergency Department (HOSPITAL_COMMUNITY): Payer: Medicare Other

## 2015-09-09 ENCOUNTER — Inpatient Hospital Stay (HOSPITAL_COMMUNITY)
Admission: EM | Admit: 2015-09-09 | Discharge: 2015-09-18 | DRG: 330 | Disposition: A | Discharge status: Home Health | Payer: Medicare Other | Attending: Internal Medicine | Admitting: Internal Medicine

## 2015-09-09 ENCOUNTER — Encounter (HOSPITAL_COMMUNITY): Payer: Self-pay | Admitting: Emergency Medicine

## 2015-09-09 DIAGNOSIS — I248 Other forms of acute ischemic heart disease: Secondary | ICD-10-CM | POA: Diagnosis present

## 2015-09-09 DIAGNOSIS — J90 Pleural effusion, not elsewhere classified: Secondary | ICD-10-CM | POA: Diagnosis not present

## 2015-09-09 DIAGNOSIS — Z794 Long term (current) use of insulin: Secondary | ICD-10-CM | POA: Diagnosis not present

## 2015-09-09 DIAGNOSIS — H409 Unspecified glaucoma: Secondary | ICD-10-CM

## 2015-09-09 DIAGNOSIS — I119 Hypertensive heart disease without heart failure: Secondary | ICD-10-CM | POA: Diagnosis present

## 2015-09-09 DIAGNOSIS — I25709 Atherosclerosis of coronary artery bypass graft(s), unspecified, with unspecified angina pectoris: Secondary | ICD-10-CM | POA: Diagnosis present

## 2015-09-09 DIAGNOSIS — K5939 Other megacolon: Secondary | ICD-10-CM | POA: Diagnosis present

## 2015-09-09 DIAGNOSIS — J9811 Atelectasis: Secondary | ICD-10-CM | POA: Diagnosis not present

## 2015-09-09 DIAGNOSIS — R45851 Suicidal ideations: Secondary | ICD-10-CM | POA: Diagnosis present

## 2015-09-09 DIAGNOSIS — R042 Hemoptysis: Secondary | ICD-10-CM | POA: Insufficient documentation

## 2015-09-09 DIAGNOSIS — R112 Nausea with vomiting, unspecified: Secondary | ICD-10-CM | POA: Diagnosis present

## 2015-09-09 DIAGNOSIS — Z888 Allergy status to other drugs, medicaments and biological substances status: Secondary | ICD-10-CM

## 2015-09-09 DIAGNOSIS — K219 Gastro-esophageal reflux disease without esophagitis: Secondary | ICD-10-CM | POA: Diagnosis present

## 2015-09-09 DIAGNOSIS — I25810 Atherosclerosis of coronary artery bypass graft(s) without angina pectoris: Secondary | ICD-10-CM | POA: Diagnosis present

## 2015-09-09 DIAGNOSIS — N200 Calculus of kidney: Secondary | ICD-10-CM

## 2015-09-09 DIAGNOSIS — K5750 Diverticulosis of both small and large intestine without perforation or abscess without bleeding: Secondary | ICD-10-CM | POA: Diagnosis present

## 2015-09-09 DIAGNOSIS — J310 Chronic rhinitis: Secondary | ICD-10-CM | POA: Diagnosis present

## 2015-09-09 DIAGNOSIS — I25710 Atherosclerosis of autologous vein coronary artery bypass graft(s) with unstable angina pectoris: Secondary | ICD-10-CM | POA: Diagnosis not present

## 2015-09-09 DIAGNOSIS — Z886 Allergy status to analgesic agent status: Secondary | ICD-10-CM

## 2015-09-09 DIAGNOSIS — R1900 Intra-abdominal and pelvic swelling, mass and lump, unspecified site: Secondary | ICD-10-CM | POA: Diagnosis present

## 2015-09-09 DIAGNOSIS — Z8249 Family history of ischemic heart disease and other diseases of the circulatory system: Secondary | ICD-10-CM | POA: Diagnosis not present

## 2015-09-09 DIAGNOSIS — E1139 Type 2 diabetes mellitus with other diabetic ophthalmic complication: Secondary | ICD-10-CM | POA: Diagnosis present

## 2015-09-09 DIAGNOSIS — R339 Retention of urine, unspecified: Secondary | ICD-10-CM | POA: Diagnosis not present

## 2015-09-09 DIAGNOSIS — R599 Enlarged lymph nodes, unspecified: Secondary | ICD-10-CM | POA: Diagnosis present

## 2015-09-09 DIAGNOSIS — I160 Hypertensive urgency: Secondary | ICD-10-CM | POA: Diagnosis not present

## 2015-09-09 DIAGNOSIS — Z87442 Personal history of urinary calculi: Secondary | ICD-10-CM | POA: Diagnosis not present

## 2015-09-09 DIAGNOSIS — E118 Type 2 diabetes mellitus with unspecified complications: Secondary | ICD-10-CM | POA: Diagnosis present

## 2015-09-09 DIAGNOSIS — K6389 Other specified diseases of intestine: Secondary | ICD-10-CM | POA: Diagnosis not present

## 2015-09-09 DIAGNOSIS — Z01810 Encounter for preprocedural cardiovascular examination: Secondary | ICD-10-CM

## 2015-09-09 DIAGNOSIS — E785 Hyperlipidemia, unspecified: Secondary | ICD-10-CM

## 2015-09-09 DIAGNOSIS — Z951 Presence of aortocoronary bypass graft: Secondary | ICD-10-CM

## 2015-09-09 DIAGNOSIS — Z85820 Personal history of malignant melanoma of skin: Secondary | ICD-10-CM

## 2015-09-09 DIAGNOSIS — Z79899 Other long term (current) drug therapy: Secondary | ICD-10-CM | POA: Diagnosis not present

## 2015-09-09 DIAGNOSIS — R7989 Other specified abnormal findings of blood chemistry: Secondary | ICD-10-CM

## 2015-09-09 DIAGNOSIS — I2510 Atherosclerotic heart disease of native coronary artery without angina pectoris: Secondary | ICD-10-CM | POA: Diagnosis not present

## 2015-09-09 DIAGNOSIS — K56609 Unspecified intestinal obstruction, unspecified as to partial versus complete obstruction: Secondary | ICD-10-CM | POA: Diagnosis present

## 2015-09-09 DIAGNOSIS — T502X5A Adverse effect of carbonic-anhydrase inhibitors, benzothiadiazides and other diuretics, initial encounter: Secondary | ICD-10-CM | POA: Diagnosis not present

## 2015-09-09 DIAGNOSIS — Z809 Family history of malignant neoplasm, unspecified: Secondary | ICD-10-CM

## 2015-09-09 DIAGNOSIS — J811 Chronic pulmonary edema: Secondary | ICD-10-CM | POA: Diagnosis present

## 2015-09-09 DIAGNOSIS — K5669 Other intestinal obstruction: Secondary | ICD-10-CM | POA: Diagnosis not present

## 2015-09-09 DIAGNOSIS — E119 Type 2 diabetes mellitus without complications: Secondary | ICD-10-CM | POA: Diagnosis present

## 2015-09-09 DIAGNOSIS — K5649 Other impaction of intestine: Secondary | ICD-10-CM | POA: Diagnosis not present

## 2015-09-09 DIAGNOSIS — Z880 Allergy status to penicillin: Secondary | ICD-10-CM

## 2015-09-09 DIAGNOSIS — R778 Other specified abnormalities of plasma proteins: Secondary | ICD-10-CM

## 2015-09-09 DIAGNOSIS — K56699 Other intestinal obstruction unspecified as to partial versus complete obstruction: Secondary | ICD-10-CM

## 2015-09-09 DIAGNOSIS — K5660 Unspecified intestinal obstruction: Secondary | ICD-10-CM | POA: Diagnosis present

## 2015-09-09 DIAGNOSIS — G43A0 Cyclical vomiting, not intractable: Secondary | ICD-10-CM

## 2015-09-09 DIAGNOSIS — F17200 Nicotine dependence, unspecified, uncomplicated: Secondary | ICD-10-CM | POA: Diagnosis present

## 2015-09-09 DIAGNOSIS — E876 Hypokalemia: Secondary | ICD-10-CM | POA: Diagnosis not present

## 2015-09-09 DIAGNOSIS — M1990 Unspecified osteoarthritis, unspecified site: Secondary | ICD-10-CM | POA: Diagnosis present

## 2015-09-09 DIAGNOSIS — R1032 Left lower quadrant pain: Secondary | ICD-10-CM | POA: Diagnosis present

## 2015-09-09 LAB — GLUCOSE, CAPILLARY
Glucose-Capillary: 104 mg/dL — ABNORMAL HIGH (ref 65–99)
Glucose-Capillary: 135 mg/dL — ABNORMAL HIGH (ref 65–99)

## 2015-09-09 LAB — LIPASE, BLOOD: LIPASE: 19 U/L — AB (ref 22–51)

## 2015-09-09 LAB — COMPREHENSIVE METABOLIC PANEL
ALBUMIN: 3.5 g/dL (ref 3.5–5.0)
ALT: 14 U/L — AB (ref 17–63)
AST: 21 U/L (ref 15–41)
Alkaline Phosphatase: 73 U/L (ref 38–126)
Anion gap: 6 (ref 5–15)
BUN: 20 mg/dL (ref 6–20)
CO2: 27 mmol/L (ref 22–32)
CREATININE: 0.74 mg/dL (ref 0.61–1.24)
Calcium: 9.1 mg/dL (ref 8.9–10.3)
Chloride: 105 mmol/L (ref 101–111)
GFR calc Af Amer: >60 mL/min (ref >60)
GFR calc non Af Amer: >60 mL/min (ref >60)
GLUCOSE: 144 mg/dL — AB (ref 65–99)
POTASSIUM: 3.7 mmol/L (ref 3.5–5.1)
SODIUM: 138 mmol/L (ref 135–145)
Total Bilirubin: 0.5 mg/dL (ref 0.3–1.2)
Total Protein: 7 g/dL (ref 6.5–8.1)

## 2015-09-09 LAB — URINALYSIS, ROUTINE W REFLEX MICROSCOPIC
Bilirubin Urine: NEGATIVE
Glucose, UA: NEGATIVE mg/dL
Hgb urine dipstick: NEGATIVE
Ketones, ur: NEGATIVE mg/dL
Leukocytes, UA: NEGATIVE
NITRITE: NEGATIVE
Protein, ur: NEGATIVE mg/dL
SPECIFIC GRAVITY, URINE: 1.046 — AB (ref 1.005–1.030)
UROBILINOGEN UA: 1 mg/dL (ref 0.0–1.0)
pH: 7.5 (ref 5.0–8.0)

## 2015-09-09 LAB — CBC
HEMATOCRIT: 39.9 % (ref 39.0–52.0)
HEMOGLOBIN: 13.1 g/dL (ref 13.0–17.0)
MCH: 31 pg (ref 26.0–34.0)
MCHC: 32.8 g/dL (ref 30.0–36.0)
MCV: 94.3 fL (ref 78.0–100.0)
Platelets: 341 10*3/uL (ref 150–400)
RBC: 4.23 MIL/uL (ref 4.22–5.81)
RDW: 13.3 % (ref 11.5–15.5)
WBC: 9.5 10*3/uL (ref 4.0–10.5)

## 2015-09-09 LAB — PROTIME-INR
INR: 0.95 (ref 0.00–1.49)
PROTHROMBIN TIME: 12.9 s (ref 11.6–15.2)

## 2015-09-09 LAB — TROPONIN I: Troponin I: <0.03 ng/mL (ref <0.031)

## 2015-09-09 MED ORDER — PANTOPRAZOLE SODIUM 40 MG PO TBEC
40.0000 mg | DELAYED_RELEASE_TABLET | Freq: Every day | ORAL | Status: DC
  Administered 2015-09-09: 40 mg via ORAL
  Filled 2015-09-09: qty 1

## 2015-09-09 MED ORDER — ONDANSETRON HCL 4 MG/2ML IJ SOLN
4.0000 mg | Freq: Three times a day (TID) | INTRAMUSCULAR | Status: DC | PRN
  Administered 2015-09-09: 4 mg via INTRAVENOUS
  Filled 2015-09-09: qty 2

## 2015-09-09 MED ORDER — ENOXAPARIN SODIUM 40 MG/0.4ML ~~LOC~~ SOLN
40.0000 mg | SUBCUTANEOUS | Status: DC
  Administered 2015-09-09: 40 mg via SUBCUTANEOUS
  Filled 2015-09-09: qty 0.4

## 2015-09-09 MED ORDER — ONDANSETRON HCL 4 MG/2ML IJ SOLN
4.0000 mg | Freq: Four times a day (QID) | INTRAMUSCULAR | Status: DC | PRN
  Administered 2015-09-11 – 2015-09-15 (×5): 4 mg via INTRAVENOUS
  Filled 2015-09-09 (×5): qty 2

## 2015-09-09 MED ORDER — IOHEXOL 300 MG/ML  SOLN
50.0000 mL | Freq: Once | INTRAMUSCULAR | Status: DC | PRN
  Administered 2015-09-09: 50 mL via ORAL
  Filled 2015-09-09: qty 50

## 2015-09-09 MED ORDER — SODIUM CHLORIDE 0.9 % IV SOLN
1000.0000 mL | INTRAVENOUS | Status: DC
  Administered 2015-09-09: 1000 mL via INTRAVENOUS

## 2015-09-09 MED ORDER — PROMETHAZINE HCL 25 MG/ML IJ SOLN
12.5000 mg | Freq: Four times a day (QID) | INTRAMUSCULAR | Status: DC | PRN
  Administered 2015-09-09 – 2015-09-14 (×8): 12.5 mg via INTRAVENOUS
  Filled 2015-09-09 (×8): qty 1

## 2015-09-09 MED ORDER — SODIUM CHLORIDE 0.9 % IV SOLN
INTRAVENOUS | Status: DC
  Administered 2015-09-09: 16:00:00 via INTRAVENOUS

## 2015-09-09 MED ORDER — MORPHINE SULFATE (PF) 2 MG/ML IV SOLN
1.0000 mg | INTRAVENOUS | Status: DC | PRN
  Administered 2015-09-09: 4 mg via INTRAVENOUS
  Administered 2015-09-09 (×2): 2 mg via INTRAVENOUS
  Filled 2015-09-09 (×2): qty 1
  Filled 2015-09-09: qty 2

## 2015-09-09 MED ORDER — HYDRALAZINE HCL 20 MG/ML IJ SOLN
5.0000 mg | INTRAMUSCULAR | Status: DC | PRN
  Administered 2015-09-09 – 2015-09-11 (×3): 5 mg via INTRAVENOUS
  Filled 2015-09-09 (×3): qty 1

## 2015-09-09 MED ORDER — MORPHINE SULFATE (PF) 2 MG/ML IV SOLN: 1.0000 mg | INTRAVENOUS | Status: DC | PRN

## 2015-09-09 MED ORDER — FENTANYL CITRATE (PF) 100 MCG/2ML IJ SOLN
25.0000 ug | Freq: Once | INTRAMUSCULAR | Status: AC
  Administered 2015-09-09: 25 ug via INTRAVENOUS
  Filled 2015-09-09: qty 2

## 2015-09-09 MED ORDER — INSULIN ASPART 100 UNIT/ML ~~LOC~~ SOLN
0.0000 [IU] | Freq: Three times a day (TID) | SUBCUTANEOUS | Status: DC
  Administered 2015-09-11 – 2015-09-12 (×3): 2 [IU] via SUBCUTANEOUS
  Administered 2015-09-12: 1 [IU] via SUBCUTANEOUS
  Administered 2015-09-13 – 2015-09-14 (×5): 2 [IU] via SUBCUTANEOUS
  Administered 2015-09-14 (×2): 1 [IU] via SUBCUTANEOUS
  Administered 2015-09-15: 2 [IU] via SUBCUTANEOUS
  Administered 2015-09-15: 1 [IU] via SUBCUTANEOUS
  Administered 2015-09-16 – 2015-09-17 (×2): 2 [IU] via SUBCUTANEOUS

## 2015-09-09 MED ORDER — METOPROLOL SUCCINATE ER 50 MG PO TB24
50.0000 mg | ORAL_TABLET | Freq: Every day | ORAL | Status: DC
  Administered 2015-09-09: 50 mg via ORAL
  Filled 2015-09-09: qty 1

## 2015-09-09 MED ORDER — LEVOTHYROXINE SODIUM 25 MCG PO TABS: 50.0000 ug | ORAL_TABLET | Freq: Every day | ORAL | Status: DC

## 2015-09-09 MED ORDER — ONDANSETRON HCL 4 MG/2ML IJ SOLN
4.0000 mg | Freq: Once | INTRAMUSCULAR | Status: AC
  Administered 2015-09-09: 4 mg via INTRAVENOUS
  Filled 2015-09-09: qty 2

## 2015-09-09 MED ORDER — SODIUM CHLORIDE 0.9 % IV SOLN
INTRAVENOUS | Status: DC
  Administered 2015-09-09: 75 mL/h via INTRAVENOUS
  Administered 2015-09-10 (×2): via INTRAVENOUS

## 2015-09-09 MED ORDER — IOHEXOL 300 MG/ML  SOLN
100.0000 mL | Freq: Once | INTRAMUSCULAR | Status: AC | PRN
  Administered 2015-09-09: 100 mL via INTRAVENOUS

## 2015-09-09 MED ORDER — HYDROMORPHONE HCL 1 MG/ML IJ SOLN: 0.5000 mg | INTRAMUSCULAR | Status: DC | PRN

## 2015-09-09 MED ORDER — ONDANSETRON HCL 4 MG PO TABS
4.0000 mg | ORAL_TABLET | Freq: Four times a day (QID) | ORAL | Status: DC | PRN
  Administered 2015-09-18: 4 mg via ORAL
  Filled 2015-09-09: qty 1

## 2015-09-09 MED ORDER — ISOSORBIDE MONONITRATE ER 30 MG PO TB24
60.0000 mg | ORAL_TABLET | Freq: Every evening | ORAL | Status: DC
  Administered 2015-09-09: 60 mg via ORAL
  Filled 2015-09-09: qty 2

## 2015-09-09 MED ORDER — ACETAMINOPHEN 325 MG PO TABS
325.0000 mg | ORAL_TABLET | Freq: Four times a day (QID) | ORAL | Status: DC | PRN
  Filled 2015-09-09: qty 1

## 2015-09-09 MED ORDER — HYDROMORPHONE HCL 1 MG/ML IJ SOLN
1.0000 mg | INTRAMUSCULAR | Status: DC | PRN
  Administered 2015-09-09 – 2015-09-11 (×8): 1 mg via INTRAVENOUS
  Administered 2015-09-11: .4 mg via INTRAVENOUS
  Administered 2015-09-11: .2 mg via INTRAVENOUS
  Filled 2015-09-09 (×9): qty 1

## 2015-09-09 MED ORDER — LATANOPROST 0.005 % OP SOLN
1.0000 [drp] | Freq: Every day | OPHTHALMIC | Status: DC
  Administered 2015-09-09 – 2015-09-17 (×8): 1 [drp] via OPHTHALMIC
  Filled 2015-09-09: qty 2.5

## 2015-09-09 NOTE — H&P (Signed)
[facility] History and Physical  Kenneth [name] DPO:[id number] DOB: 1928/05/25 DOA: 09/09/2015  Referring physician: ED physician, Dr. Vanita Panda  PCP: Irven Shelling, MD   Chief Complaint: abd pain, N/V  HPI:  Pt is 81 yo white male who presented to [facility] with main concern of several months duration of progressively worsening LLQ abdominal pain that was initially intermittent and 3-4/10 in severity but has progressed to constant and now 10/10 in severity over the past 24 hours, occasionally radiating to epigastric area, no specific alleviating factors, worse with eating and associated nausea but no vomiting.   In ED, pt noted to be hemodynamically stable, VS notable for BP 214/73, imaging studies notable for colonic mass with ? SBO. Surgery team consulted and [facility] asked to admit for evaluation.   Assessment and Plan:  Principal Problem:   Nausea and vomiting - secondary to ? 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 The colon proximal to this abnormal sigmoid colon is distended with a large amount of stool and pericolonic inflammatory changes. There is a  small amount of fluid in left upper quadrant and small to moderate amount of fluid in the pelvis. In addition, there are multiple lymph nodes throughout the sigmoid mesocolon region. Findings are highly concerning for a neoplastic process in the sigmoid colon causing at least partial obstruction and inflammatory changes in the descending colon.  Large hiatal hernia containing most of the stomach.  Nonobstructive right kidney stone.   Code Status: Full Family Communication: Pt at bedside Disposition Plan: Admit for further evaluation    [facility] [phone]   Review of Systems:  Constitutional: Negative for fever, chills and malaise/fatigue. Negative for diaphoresis.  HENT: Negative for hearing loss, ear pain, nosebleeds, congestion, sore throat, neck pain, tinnitus and ear discharge.   Eyes: Negative for blurred vision, double vision, photophobia, pain, discharge and redness.  Respiratory: Negative for cough, hemoptysis, sputum production, shortness of breath, wheezing and stridor.   Cardiovascular: Negative for chest pain, palpitations, orthopnea, claudication and leg swelling.  Gastrointestinal: Negative for nausea, vomiting and abdominal pain. Negative for heartburn, constipation, blood in stool and melena.  Genitourinary: Negative for dysuria, urgency, frequency, hematuria and flank pain.  Musculoskeletal: Negative for myalgias, back pain, joint pain and falls.  Skin: Negative for itching and rash.  Neurological: Negative for dizziness and weakness. Negative for tingling, tremors, sensory change, speech change, focal weakness, loss of consciousness and headaches.  Endo/Heme/Allergies: Negative for environmental allergies and polydipsia. Does not bruise/bleed easily.  Psychiatric/Behavioral: Negative for suicidal ideas. The patient is not nervous/anxious.  Past Medical History  Diagnosis Date  . Diabetes ([place])   . HTN (hypertension)   . Coronary atherosclerosis of unspecified  type of vessel, native or graft     CABG 1993, LIMA to LAD, SVG to obtuse marginal one, SVG to posterior descending, stable angina  . Hyperlipidemia `  . GERD (gastroesophageal reflux disease)   . Chronic rhinitis   . Glaucoma   . Melanoma of skin, site unspecified   . Hernia     left side  . Complication of anesthesia   . PONV (postoperative nausea and vomiting)   . Anemia     on iron  . [facility] spotted fever 2-3 yrs ago  . CAD (coronary artery disease)   . Diverticulosis of colon   . Anginal pain ([place])     last 3 days ago goes away if stop and rest  . Dysrhythmia     occ "skips a beat"  . History of kidney stones   . H/O hiatal hernia   . Arthritis     Past Surgical History  Procedure Laterality Date  . Right leg fracture age 48    . Coronary artery bypass graft  1990's    x 3  . Tonsillectomy  age 12 or 56  . Colonoscopy with propofol N/A 11/27/2013    Procedure: COLONOSCOPY WITH PROPOFOL;  Surgeon: Garlan Fair, MD;  Location: [facility];  Service: Endoscopy;  Laterality: N/A;  . Inguinal hernia repair Left 06/28/2014    Procedure: Left  INGUINAL HERNIA REPAIR ;  Surgeon: Merrie Roof, MD;  Location: [facility];  Service: General;  Laterality: Left;  . Insertion of mesh Left 06/28/2014    Procedure: INSERTION OF MESH;  Surgeon: Merrie Roof, MD;  Location: [facility];  Service: General;  Laterality: Left;  . Hernia repair  [phone]    lft ing hernia  . Insertion of mesh N/A 10/11/2014  . Laparoscopy  10/11/2014  . Ventral hernia repair  10/11/2014    Procedure: HERNIA REPAIR VENTRAL ADULT;  Surgeon: Autumn Messing III, MD;  Location: [place];  Service: General;;    Social History:  reports that he has been smoking Cigars.  He has never used smokeless tobacco. He reports that he does not drink alcohol or use illicit drugs.  Allergies  Allergen Reactions  . Aspirin Other (See Comments)    Bleeding.    . Lipitor [Atorvastatin] Other (See Comments)    tired  .  Lopressor [Metoprolol Tartrate] Other (See Comments)    Sleepy all the time  . Penicillins Hives and Rash    Has patient had a PCN reaction causing immediate rash, facial/tongue/throat swelling, SOB or lightheadedness with hypotension: No Has patient had a PCN reaction causing severe rash involving mucus membranes or skin necrosis: No Has patient had a PCN reaction that required hospitalization No Has patient had a PCN reaction occurring within the last 10 years: Yes If all of the above answers are "NO", then may proceed with Cephalosporin use.     Family History  Problem Relation Age of Onset  . CAD Father   . Hyperlipidemia Father   . Cancer Mother     Prior to Admission medications   Medication Sig Start Date End Date Taking? Authorizing Provider  acetaminophen (TYLENOL) 325 MG tablet Take 325 mg by mouth every 6 (six) hours as needed for mild pain.    Yes Historical Provider, MD  Cyanocobalamin (VITAMIN B-12 PO) Take 1 tablet  by mouth daily.   Yes Historical Provider, MD  ferrous sulfate 325 (65 FE) MG tablet Take 325 mg by mouth daily with breakfast.   Yes Historical Provider, MD  hydrochlorothiazide (HYDRODIURIL) 25 MG tablet Take 12.5 mg by mouth daily.  10/02/14  Yes Historical Provider, MD  ipratropium (ATROVENT) 0.03 % nasal spray Place 2 sprays into both nostrils 2 (two) times daily as needed for rhinitis.   Yes Historical Provider, MD  isosorbide mononitrate (IMDUR) 60 MG 24 hr tablet Take 60 mg by mouth every evening.    Yes Historical Provider, MD  latanoprost (XALATAN) 0.005 % ophthalmic solution Place 1 drop into both eyes at bedtime.   Yes Historical Provider, MD  levothyroxine (SYNTHROID, LEVOTHROID) 50 MCG tablet Take 50 mcg by mouth daily before breakfast.   Yes Historical Provider, MD  metFORMIN (GLUCOPHAGE) 1000 MG tablet Take 500 mg by mouth daily with breakfast.    Yes Historical Provider, MD  metoprolol succinate (TOPROL-XL) 50 MG 24 hr tablet TAKE 1 TABLET  DAILY Patient taking differently: TAKE 1/2 TABLET DAILY 11/05/14  Yes Belva Crome, MD  Multiple Vitamin (MULTIVITAMIN WITH MINERALS) TABS tablet Take 1 tablet by mouth daily.   Yes Historical Provider, MD  nitroGLYCERIN (NITROSTAT) 0.4 MG SL tablet Place 0.4 mg under the tongue every 5 (five) minutes as needed for chest pain.   Yes Historical Provider, MD  pantoprazole (PROTONIX) 40 MG tablet Take 40 mg by mouth daily.   Yes Historical Provider, MD  potassium chloride (K-DUR,KLOR-CON) 10 MEQ tablet Take 10 mEq by mouth daily.    Yes Historical Provider, MD  simvastatin (ZOCOR) 20 MG tablet Take 20 mg by mouth every evening.   Yes Historical Provider, MD    Physical Exam: Filed Vitals:   09/09/15 1255 09/09/15 1358 09/09/15 1452 09/09/15 1507  BP: 196/81 184/78 184/87 202/87  Pulse: 83 73 84 76  Temp: 97.9 F (36.6 C)  98 F (36.7 C) 98.2 F (36.8 C)  TempSrc: Oral   Oral  Resp: 16  16 18   Height:    5\' 11"  (1.803 m)  Weight:    70.5 kg (155 lb 6.8 oz)  SpO2: 97% 99% 99% 98%    Physical Exam  Constitutional: Appears well-developed and well-nourished. No distress.  HENT: Normocephalic. External right and left ear normal. Oropharynx is clear and moist.  Eyes: Conjunctivae and EOM are normal. PERRLA, no scleral icterus.  Neck: Normal ROM. Neck supple. No JVD. No tracheal deviation. No thyromegaly.  CVS: RRR, S1/S2 +, no murmurs, no gallops, no carotid bruit.  Pulmonary: Effort and breath sounds normal, no stridor, rhonchi, wheezes, rales.  Abdominal: Soft. BS +,  no distension, tenderness in lower abd quadrants, no rebound or guarding.  Musculoskeletal: Normal range of motion. No edema and no tenderness.  Lymphadenopathy: No lymphadenopathy noted, cervical, inguinal. Neuro: Alert. Normal reflexes, muscle tone coordination. No cranial nerve deficit. Skin: Skin is warm and dry. No rash noted. Not diaphoretic. No erythema. No pallor.  Psychiatric: Normal mood and affect. Behavior,  judgment, thought content normal.   Labs on Admission:  Basic Metabolic Panel:  Recent Labs Lab 09/09/15 1020  NA 138  K 3.7  CL 105  CO2 27  GLUCOSE 144*  BUN 20  CREATININE 0.74  CALCIUM 9.1   Liver Function Tests:  Recent Labs Lab 09/09/15 1020  AST 21  ALT 14*  ALKPHOS 73  BILITOT 0.5  PROT 7.0  ALBUMIN 3.5    Recent Labs Lab  09/09/15 1020  LIPASE 19*   CBC:  Recent Labs Lab 09/09/15 1020  WBC 9.5  HGB 13.1  HCT 39.9  MCV 94.3  PLT 341   EKG: pending   If 7PM-7AM, please contact night-coverage [email or web address] Password [email or web address] 09/09/2015, 3:41 PM

## 2015-09-09 NOTE — ED Notes (Signed)
Pt aware of needed urine specimen.

## 2015-09-09 NOTE — Progress Notes (Signed)
Received from 3W, alert and oriented , no complaints of any pain at this time.

## 2015-09-09 NOTE — ED Notes (Addendum)
3W states that this pt will be getting bed on another floor

## 2015-09-09 NOTE — ED Notes (Signed)
Patient transported to CT 

## 2015-09-09 NOTE — ED Notes (Signed)
No NG for this pt per Saverio Danker PA with [facility]

## 2015-09-09 NOTE — ED Notes (Signed)
Per pt, states left lower abdominal pain on and off for 2 weeks-saw PCP yesterday and had labs done

## 2015-09-09 NOTE — ED Notes (Signed)
Call back to 3W

## 2015-09-09 NOTE — ED Provider Notes (Signed)
CSN: [id number]     Arrival date & time 09/09/15  5364 History   First MD Initiated Contact with Patient 09/09/15 1000     Chief Complaint  Patient presents with  . Abdominal Pain     HPI  Patient presents with left lower abdominal pain. Pain is been present for at least 2 weeks, though increasingly severe and persistent for the past 2 days. The pain is focally in the left lower quadrant, with minimal radiation.  Pain is sore, severe. There is associated nausea, anorexia, and no bowel movements in 3 days. Yesterday, the patient saw his physician, initiated evaluation, but with persistent pain this morning, he is here for evaluation. He denies ongoing fever, chills, chest pain, dyspnea.   Past Medical History  Diagnosis Date  . Diabetes ([place])   . HTN (hypertension)   . Coronary atherosclerosis of unspecified type of vessel, native or graft     CABG 1993, LIMA to LAD, SVG to obtuse marginal one, SVG to posterior descending, stable angina  . Hyperlipidemia `  . GERD (gastroesophageal reflux disease)   . Chronic rhinitis   . Glaucoma   . Melanoma of skin, site unspecified   . Hernia     left side  . Complication of anesthesia   . PONV (postoperative nausea and vomiting)   . Anemia     on iron  . [facility] spotted fever 2-3 yrs ago  . CAD (coronary artery disease)   . Diverticulosis of colon   . Anginal pain ([place])     last 3 days ago goes away if stop and rest  . Dysrhythmia     occ "skips a beat"  . History of kidney stones   . H/O hiatal hernia   . Arthritis    Past Surgical History  Procedure Laterality Date  . Right leg fracture age 41    . Coronary artery bypass graft  1990's    x 3  . Tonsillectomy  age 13 or 44  . Colonoscopy with propofol N/A 11/27/2013    Procedure: COLONOSCOPY WITH PROPOFOL;  Surgeon: Garlan Fair, MD;  [facility];  Service: Endoscopy;  Laterality: N/A;  . Inguinal hernia repair Left 06/28/2014    Procedure: Left   INGUINAL HERNIA REPAIR ;  Surgeon: Merrie Roof, MD;  [facility]: [facility];  Service: General;  Laterality: Left;  . Insertion of mesh Left 06/28/2014    Procedure: INSERTION OF MESH;  Surgeon: Merrie Roof, MD;  [facility];  Service: General;  Laterality: Left;  . Hernia repair  [phone]    lft ing hernia  . Insertion of mesh N/A 10/11/2014    Procedure: INSERTION OF MESH;  Surgeon: Autumn Messing III, MD;  Location: [place];  Service: General;  Laterality: N/A;  . Laparoscopy  10/11/2014    Procedure: LAPAROSCOPY DIAGNOSTIC;  Surgeon: Autumn Messing III, MD;  Location: [facility];  Service: General;;  . Ventral hernia repair  10/11/2014    Procedure: HERNIA REPAIR VENTRAL ADULT;  Surgeon: Autumn Messing III, MD;  Location: [facility];  Service: General;;   Family History  Problem Relation Age of Onset  . CAD Father   . Hyperlipidemia Father   . Cancer Mother    Social History  Substance Use Topics  . Smoking status: Light Tobacco Smoker    Types: Cigars  . Smokeless tobacco: Never Used  . Alcohol Use: No    Review of Systems  Constitutional:  Per HPI, otherwise negative  HENT:       Per HPI, otherwise negative  Respiratory:       Per HPI, otherwise negative  Cardiovascular:       Per HPI, otherwise negative  Gastrointestinal: Positive for nausea. Negative for vomiting.  Endocrine:       Negative aside from HPI  Genitourinary:       Neg aside from HPI   Musculoskeletal:       Per HPI, otherwise negative  Skin: Negative.   Neurological: Negative for syncope.      Allergies  Aspirin; Lipitor; Lopressor; and Penicillins  Home Medications   Prior to Admission medications   Medication Sig Start Date End Date Taking? Authorizing Provider  acetaminophen (TYLENOL) 325 MG tablet Take 325 mg by mouth every 6 (six) hours as needed for mild pain.    Yes Historical Provider, MD  Cyanocobalamin (VITAMIN B-12 PO) Take 1 tablet by mouth daily.   Yes Historical Provider, MD  ferrous sulfate  325 (65 FE) MG tablet Take 325 mg by mouth daily with breakfast.   Yes Historical Provider, MD  hydrochlorothiazide (HYDRODIURIL) 25 MG tablet Take 12.5 mg by mouth daily.  10/02/14  Yes Historical Provider, MD  ipratropium (ATROVENT) 0.03 % nasal spray Place 2 sprays into both nostrils 2 (two) times daily as needed for rhinitis.   Yes Historical Provider, MD  isosorbide mononitrate (IMDUR) 60 MG 24 hr tablet Take 60 mg by mouth every evening.    Yes Historical Provider, MD  latanoprost (XALATAN) 0.005 % ophthalmic solution Place 1 drop into both eyes at bedtime.   Yes Historical Provider, MD  levothyroxine (SYNTHROID, LEVOTHROID) 50 MCG tablet Take 50 mcg by mouth daily before breakfast.   Yes Historical Provider, MD  metFORMIN (GLUCOPHAGE) 1000 MG tablet Take 500 mg by mouth daily with breakfast.    Yes Historical Provider, MD  metoprolol succinate (TOPROL-XL) 50 MG 24 hr tablet TAKE 1 TABLET DAILY Patient taking differently: TAKE 1/2 TABLET DAILY 11/05/14  Yes Belva Crome, MD  Multiple Vitamin (MULTIVITAMIN WITH MINERALS) TABS tablet Take 1 tablet by mouth daily.   Yes Historical Provider, MD  nitroGLYCERIN (NITROSTAT) 0.4 MG SL tablet Place 0.4 mg under the tongue every 5 (five) minutes as needed for chest pain.   Yes Historical Provider, MD  pantoprazole (PROTONIX) 40 MG tablet Take 40 mg by mouth daily.   Yes Historical Provider, MD  potassium chloride (K-DUR,KLOR-CON) 10 MEQ tablet Take 10 mEq by mouth daily.    Yes Historical Provider, MD  simvastatin (ZOCOR) 20 MG tablet Take 20 mg by mouth every evening.   Yes Historical Provider, MD   BP 214/73 mmHg  Pulse 74  Temp(Src) 98 F (36.7 C) (Oral)  Resp 18  SpO2 98% Physical Exam  Constitutional: He is oriented to person, place, and time. He appears well-developed. No distress.  HENT:  Head: Normocephalic and atraumatic.  Eyes: Conjunctivae and EOM are normal.  Cardiovascular: Normal rate and regular rhythm.   Pulmonary/Chest: Effort  normal. No stridor. No respiratory distress.  Abdominal: He exhibits no distension. There is no hepatosplenomegaly. There is tenderness in the left lower quadrant. There is no rigidity and no guarding.  Musculoskeletal: He exhibits no edema.  Neurological: He is alert and oriented to person, place, and time.  Skin: Skin is warm and dry.  Psychiatric: He has a normal mood and affect.  Nursing note and vitals reviewed.   ED Course  Procedures (including critical  care time) Labs Review Labs Reviewed  LIPASE, BLOOD - Abnormal; Notable for the following:    Lipase 19 (*)    All other components within normal limits  COMPREHENSIVE METABOLIC PANEL - Abnormal; Notable for the following:    Glucose, Bld 144 (*)    ALT 14 (*)    All other components within normal limits  URINALYSIS, ROUTINE W REFLEX MICROSCOPIC (NOT AT [facility]) - Abnormal; Notable for the following:    Specific Gravity, Urine 1.046 (*)    All other components within normal limits  CBC  CBC WITH DIFFERENTIAL/PLATELET  COMPREHENSIVE METABOLIC PANEL  LIPASE, BLOOD    Imaging Review Ct Abdomen Pelvis W Contrast  09/09/2015   CLINICAL DATA:  Left lower quadrant pain for 2 weeks. Decreased bowel movements.  EXAM: CT ABDOMEN AND PELVIS WITH CONTRAST  TECHNIQUE: Multidetector CT imaging of the abdomen and pelvis was performed using the standard protocol following bolus administration of intravenous contrast.  CONTRAST:  147mL OMNIPAQUE IOHEXOL 300 MG/ML  SOLN  COMPARISON:  08/22/2014  FINDINGS: Stable small calcified granuloma at the left lung base. No evidence pleural effusions.  No evidence for free intraperitoneal air. Again noted is a large hiatal hernia containing most of the stomach. There is no evidence for gastric obstruction or inflammatory changes. Please note that the entire stomach is not visualized. Again noted are two low-density structures in right hepatic lobe, largest measuring 2.4 cm. Findings are most compatible with  hepatic cysts. Otherwise, normal appearance the liver and the portal venous system is patent. Normal appearance of the gallbladder. There is a normal appearance of spleen. A small amount of fluid in the left upper quadrant lateral to the spleen on sequence 2, image 16. Multiple calcifications near the pancreatic head suggestive for old pancreas inflammation. Stable fullness of the right adrenal gland is unchanged. Normal appearance of the left adrenal gland. There is an 8 mm nonobstructive stone in the right kidney lower pole. Normal appearance of the left kidney.  Atherosclerotic calcifications involving the aorta and iliac arteries without aneurysm.  No gross abnormality to the urinary bladder or prostate. Small-moderate amount of fluid along the right side of the pelvis on sequence 2, image 65. Again noted is extensive wall thickening involving the sigmoid colon with diverticula. This abnormal wall thickening in the sigmoid colon involves a 10 cm segment. The descending colon is moderately distended with stool. There is gas and stool throughout the transverse colon and right colon. Mild inflammatory changes around the descending colon, best seen on sequence 2, image 32. In addition, there are prominent lymph nodes in the sigmoid mesocolon and along the drainage of the sigmoid colon, best seen on sequence 2, image 50.  No significant dilatation of small bowel loops. Previously, there was a small hernia in the left lower abdomen which has either been repaired or less conspicuous.  No acute bone abnormality. Disc space and endplates changes E7-O3 and L5-S1.  IMPRESSION: Abnormal sigmoid colon with diffuse wall thickening and concern for a colonic mass. The colon proximal to this abnormal sigmoid colon is distended with a large amount of stool and pericolonic inflammatory changes. There is a small amount of fluid in left upper quadrant and small to moderate amount of fluid in the pelvis. In addition, there are  multiple lymph nodes throughout the sigmoid mesocolon region. Findings are highly concerning for a neoplastic process in the sigmoid colon causing at least partial obstruction and inflammatory changes in the descending colon.  Large hiatal  hernia containing most of the stomach.  Nonobstructive right kidney stone.  These results were called by telephone at the time of interpretation on 09/09/2015 at 12:42 pm to Dr. Carmin Muskrat , who verbally acknowledged these results.   Electronically Signed   By: Markus Daft M.D.   On: 09/09/2015 12:46   I have personally reviewed and evaluated these images and lab results as part of my medical decision-making.    12:43 PM I discussed the CT w radiology, and gen surgery, then with hospitalist  MDM   Final diagnoses:  Other specified intestinal obstruction ([place])  Colonic mass   Patient presents with abdominal pain, nausea, vomiting, no bowel movements. Patient's evaluation is immediately concerning for bowel obstruction, and this is demonstrated on CT scan. Additionally, there is evidence for colonic mass. After discussion with surgery, hospitalist, the patient was admitted for further evaluation and management.  Carmin Muskrat, MD 09/09/15 [phone]

## 2015-09-09 NOTE — ED Notes (Addendum)
Call to 3W, await call back

## 2015-09-09 NOTE — ED Notes (Signed)
[facility] PA is at the bedside to see pt.  She advises to hold off on NG at this time, await her evaluation.

## 2015-09-09 NOTE — Consult Note (Signed)
[place] [birth year]  [id number].   Primary Care MD: Dr. Lavone Orn Requesting MD: Dr. Carmin Muskrat Chief Complaint/Reason for Consult: colon obstruction HPI: This is a pleasant 81 yo white male who presented to [facility] today with worsening LLQ abdominal pain.  He has had this pain, but not as bad over the last 3-4 months.  He also notes some change in his bowel habits going back to as far as a year, but worse over the last several months.  He has become more constipated and having a difficult time moving his bowels except a lot of air.  He has been having more early satiety as well as nausea recently.  He has tried to vomit, but can't.  He denies unintentional weight loss, night sweats, or chills.  He had a colonoscopy in 2014 by Dr. Wynetta Emery, which revealed pandiverticulosis along with resolving inflammation of his sigmoid colon with evidence of diverticulosis and possible ischemic colitis.  Denies a family history of colon cancer.  He denies any blood per rectum.  He presented today to the ED because his pain got significantly worse last night.  He has a CT scan today that reveals essentially a colonic mass with at least partial obstruction likely.  This was felt to likely be neoplastic with some surrounding LNs as well.  We have been asked to see him for further recommendations.  ROS : Please see HPI, otherwise negative  Family History  Problem Relation Age of Onset  . CAD Father   . Hyperlipidemia Father   . Cancer Mother     Past Medical History  Diagnosis Date  . Diabetes ([place])   . HTN (hypertension)   . Coronary atherosclerosis of unspecified type of vessel, native or graft     CABG 1993, LIMA to LAD, SVG to obtuse marginal one, SVG to posterior descending, stable angina  . Hyperlipidemia `  . GERD (gastroesophageal reflux disease)   . Chronic rhinitis   . Glaucoma   . Melanoma of skin, site unspecified   . Hernia     left side  . Complication of anesthesia   . PONV  (postoperative nausea and vomiting)   . Anemia     on iron  . [facility] spotted fever 2-3 yrs ago  . CAD (coronary artery disease)   . Diverticulosis of colon   . Anginal pain ([place])     last 3 days ago goes away if stop and rest  . Dysrhythmia     occ "skips a beat"  . History of kidney stones   . H/O hiatal hernia   . Arthritis     Past Surgical History  Procedure Laterality Date  . Right leg fracture age 25    . Coronary artery bypass graft  1990's    x 3  . Tonsillectomy  age 39 or 40  . Colonoscopy with propofol N/A 11/27/2013    Procedure: COLONOSCOPY WITH PROPOFOL;  Surgeon: Garlan Fair, MD;  Location: [facility];  Service: Endoscopy;  Laterality: N/A;  . Inguinal hernia repair Left 06/28/2014    Procedure: Left  INGUINAL HERNIA REPAIR ;  Surgeon: Merrie Roof, MD;  Location: [facility];  Service: General;  Laterality: Left;  . Insertion of mesh Left 06/28/2014    Procedure: INSERTION OF MESH;  Surgeon: Merrie Roof, MD;  Location: [facility];  Service: General;  Laterality: Left;  . Hernia repair  [phone]    lft ing hernia  . Insertion  of mesh N/A 10/11/2014  . Laparoscopy  10/11/2014  . Ventral hernia repair  10/11/2014    Procedure: HERNIA REPAIR VENTRAL ADULT;  Surgeon: Autumn Messing III, MD;  Location: [place];  Service: General;;    Social History:  reports that he has been smoking Cigars.  He has never used smokeless tobacco. He reports that he does not drink alcohol or use illicit drugs.  Allergies:  Allergies  Allergen Reactions  . Aspirin Other (See Comments)    Bleeding.    . Lipitor [Atorvastatin] Other (See Comments)    tired  . Lopressor [Metoprolol Tartrate] Other (See Comments)    Sleepy all the time  . Penicillins Hives and Rash    Has patient had a PCN reaction causing immediate rash, facial/tongue/throat swelling, SOB or lightheadedness with hypotension: No Has patient had a PCN reaction causing severe rash involving mucus membranes or skin  necrosis: No Has patient had a PCN reaction that required hospitalization No Has patient had a PCN reaction occurring within the last 10 years: Yes If all of the above answers are "NO", then may proceed with Cephalosporin use.      (Not in a hospital admission)  Blood pressure 184/87, pulse 84, temperature 98 F (36.7 C), temperature source Oral, resp. rate 16, SpO2 99 %. Physical Exam: General: pleasant, WD, WN white male who is laying in bed in NAD HEENT: head is normocephalic, atraumatic.  Sclera are noninjected.  PERRL.  Ears and nose without any masses or lesions.  Mouth is pink and moist Heart: regular, rate, and rhythm.  Normal s1,s2. No obvious gallops, or rubs noted, but does have a murmur. Palpable radial and pedal pulses bilaterally Lungs: CTAB, no wheezes, rhonchi, or rales noted.  Respiratory effort nonlabored Abd: soft, some distention, greatest in upper abdomen, tender throughout lower abdomen, but greatest in LLQ.  No guarding, rebounding, or peritoneal signs +BS, no masses, hernias, or organomegaly MS: all 4 extremities are symmetrical with no cyanosis, clubbing, or edema.  Scar noted on LLE from prior CABG Skin: warm and dry with no masses, lesions, or rashes Psych: A&Ox3 with an appropriate affect.    Results for orders placed or performed during the hospital encounter of 09/09/15 (from the past 48 hour(s))  Lipase, blood     Status: Abnormal   Collection Time: 09/09/15 10:20 AM  Result Value Ref Range   Lipase 19 (L) 22 - 51 U/L  Comprehensive metabolic panel     Status: Abnormal   Collection Time: 09/09/15 10:20 AM  Result Value Ref Range   Sodium 138 135 - 145 mmol/L   Potassium 3.7 3.5 - 5.1 mmol/L   Chloride 105 101 - 111 mmol/L   CO2 27 22 - 32 mmol/L   Glucose, Bld 144 (H) 65 - 99 mg/dL   BUN 20 6 - 20 mg/dL   Creatinine, Ser 0.74 0.61 - 1.24 mg/dL   Calcium 9.1 8.9 - 10.3 mg/dL   Total Protein 7.0 6.5 - 8.1 g/dL   Albumin 3.5 3.5 - 5.0 g/dL   AST  21 15 - 41 U/L   ALT 14 (L) 17 - 63 U/L   Alkaline Phosphatase 73 38 - 126 U/L   Total Bilirubin 0.5 0.3 - 1.2 mg/dL   GFR calc non Af Amer >60 >60 mL/min   GFR calc Af Amer >60 >60 mL/min    Comment: (NOTE) The eGFR has been calculated using the CKD EPI equation. This calculation has not been validated in  all clinical situations. eGFR's persistently <60 mL/min signify possible Chronic Kidney Disease.    Anion gap 6 5 - 15  CBC     Status: None   Collection Time: 09/09/15 10:20 AM  Result Value Ref Range   WBC 9.5 4.0 - 10.5 K/uL   RBC 4.23 4.22 - 5.81 MIL/uL   Hemoglobin 13.1 13.0 - 17.0 g/dL   HCT 39.9 39.0 - 52.0 %   MCV 94.3 78.0 - 100.0 fL   MCH 31.0 26.0 - 34.0 pg   MCHC 32.8 30.0 - 36.0 g/dL   RDW 13.3 11.5 - 15.5 %   Platelets 341 150 - 400 K/uL  Urinalysis, Routine w reflex microscopic (not at [facility])     Status: Abnormal   Collection Time: 09/09/15  1:01 PM  Result Value Ref Range   Color, Urine YELLOW YELLOW   APPearance CLEAR CLEAR   Specific Gravity, Urine 1.046 (H) 1.005 - 1.030   pH 7.5 5.0 - 8.0   Glucose, UA NEGATIVE NEGATIVE mg/dL   Hgb urine dipstick NEGATIVE NEGATIVE   Bilirubin Urine NEGATIVE NEGATIVE   Ketones, ur NEGATIVE NEGATIVE mg/dL   Protein, ur NEGATIVE NEGATIVE mg/dL   Urobilinogen, UA 1.0 0.0 - 1.0 mg/dL   Nitrite NEGATIVE NEGATIVE   Leukocytes, UA NEGATIVE NEGATIVE    Comment: MICROSCOPIC NOT DONE ON URINES WITH NEGATIVE PROTEIN, BLOOD, LEUKOCYTES, NITRITE, OR GLUCOSE <1000 mg/dL.   Ct Abdomen Pelvis W Contrast  09/09/2015   CLINICAL DATA:  Left lower quadrant pain for 2 weeks. Decreased bowel movements.  EXAM: CT ABDOMEN AND PELVIS WITH CONTRAST  TECHNIQUE: Multidetector CT imaging of the abdomen and pelvis was performed using the standard protocol following bolus administration of intravenous contrast.  CONTRAST:  141m OMNIPAQUE IOHEXOL 300 MG/ML  SOLN  COMPARISON:  08/22/2014  FINDINGS: Stable small calcified granuloma at the left lung  base. No evidence pleural effusions.  No evidence for free intraperitoneal air. Again noted is a large hiatal hernia containing most of the stomach. There is no evidence for gastric obstruction or inflammatory changes. Please note that the entire stomach is not visualized. Again noted are two low-density structures in right hepatic lobe, largest measuring 2.4 cm. Findings are most compatible with hepatic cysts. Otherwise, normal appearance the liver and the portal venous system is patent. Normal appearance of the gallbladder. There is a normal appearance of spleen. A small amount of fluid in the left upper quadrant lateral to the spleen on sequence 2, image 16. Multiple calcifications near the pancreatic head suggestive for old pancreas inflammation. Stable fullness of the right adrenal gland is unchanged. Normal appearance of the left adrenal gland. There is an 8 mm nonobstructive stone in the right kidney lower pole. Normal appearance of the left kidney.  Atherosclerotic calcifications involving the aorta and iliac arteries without aneurysm.  No gross abnormality to the urinary bladder or prostate. Small-moderate amount of fluid along the right side of the pelvis on sequence 2, image 65. Again noted is extensive wall thickening involving the sigmoid colon with diverticula. This abnormal wall thickening in the sigmoid colon involves a 10 cm segment. The descending colon is moderately distended with stool. There is gas and stool throughout the transverse colon and right colon. Mild inflammatory changes around the descending colon, best seen on sequence 2, image 32. In addition, there are prominent lymph nodes in the sigmoid mesocolon and along the drainage of the sigmoid colon, best seen on sequence 2, image 50.  No significant dilatation  of small bowel loops. Previously, there was a small hernia in the left lower abdomen which has either been repaired or less conspicuous.  No acute bone abnormality. Disc space and  endplates changes T5-H7 and L5-S1.  IMPRESSION: Abnormal sigmoid colon with diffuse wall thickening and concern for a colonic mass. The colon proximal to this abnormal sigmoid colon is distended with a large amount of stool and pericolonic inflammatory changes. There is a small amount of fluid in left upper quadrant and small to moderate amount of fluid in the pelvis. In addition, there are multiple lymph nodes throughout the sigmoid mesocolon region. Findings are highly concerning for a neoplastic process in the sigmoid colon causing at least partial obstruction and inflammatory changes in the descending colon.  Large hiatal hernia containing most of the stomach.  Nonobstructive right kidney stone.  These results were called by telephone at the time of interpretation on 09/09/2015 at 12:42 pm to Dr. Carmin Muskrat , who verbally acknowledged these results.   Electronically Signed   By: Markus Daft M.D.   On: 09/09/2015 12:46       Assessment/Plan 1. Colonic obstruction, secondary to neoplasm or diverticular stricture -will check CEA level -the patient just had a colonoscopy 2 years ago in our system that does not show any evidence of malignancy.  He did however have evidence of resolving colitis of his sigmoid colon with a history of diverticulitis.  This very well may represent a neoplasm, but it also may represent a diverticular benign stricture causing his obstruction.  The LNs may be reactive from either process.  We would recommend a medical admission and GI consultation for evaluation with colonoscopy/flex sig.  Also consideration if he is mostly obstructed if he could be stented so we can prep him.  This looks fairly high up and curved, but would defer to GI if stenting were possible.  If not, then the patient may need a Hartman's procedure (colectomy/colostomy) vs with a prep, hopefully just a one stage sigmoid colectomy and anastomosis.   -I have discussed all of this with the patient and his step  son as well -the patient thinks he is on a blood thinner, but there is none listed in his med rec.  I will check a PT/INR just to make sure these are not elevated.   -would recommend NPO, but no NGT is currently needed as his stomach and small intestines are decompressed.   -we will follow along and plan for surgery within the next several days pending further work up.  Estefany Goebel E 09/09/2015, 2:58 PM Pager: [phone]

## 2015-09-10 LAB — BASIC METABOLIC PANEL
ANION GAP: 8 (ref 5–15)
BUN: 17 mg/dL (ref 6–20)
CHLORIDE: 105 mmol/L (ref 101–111)
CO2: 24 mmol/L (ref 22–32)
Calcium: 8.5 mg/dL — ABNORMAL LOW (ref 8.9–10.3)
Creatinine, Ser: 0.66 mg/dL (ref 0.61–1.24)
GFR calc Af Amer: >60 mL/min (ref >60)
Glucose, Bld: 121 mg/dL — ABNORMAL HIGH (ref 65–99)
Potassium: 3.6 mmol/L (ref 3.5–5.1)
SODIUM: 137 mmol/L (ref 135–145)

## 2015-09-10 LAB — CEA: CEA: 4 ng/mL (ref 0.0–4.7)

## 2015-09-10 LAB — CBC
HEMATOCRIT: 35.7 % — AB (ref 39.0–52.0)
HEMOGLOBIN: 11.7 g/dL — AB (ref 13.0–17.0)
MCH: 31.2 pg (ref 26.0–34.0)
MCHC: 32.8 g/dL (ref 30.0–36.0)
MCV: 95.2 fL (ref 78.0–100.0)
Platelets: 339 10*3/uL (ref 150–400)
RBC: 3.75 MIL/uL — AB (ref 4.22–5.81)
RDW: 13.5 % (ref 11.5–15.5)
WBC: 8.2 10*3/uL (ref 4.0–10.5)

## 2015-09-10 LAB — TROPONIN I
TROPONIN I: 0.14 ng/mL — AB (ref <0.031)
Troponin I: 0.15 ng/mL — ABNORMAL HIGH (ref <0.031)
Troponin I: 0.12 ng/mL — ABNORMAL HIGH (ref <0.031)

## 2015-09-10 LAB — GLUCOSE, CAPILLARY
GLUCOSE-CAPILLARY: 100 mg/dL — AB (ref 65–99)
Glucose-Capillary: 119 mg/dL — ABNORMAL HIGH (ref 65–99)
Glucose-Capillary: 117 mg/dL — ABNORMAL HIGH (ref 65–99)

## 2015-09-10 MED ORDER — GENTAMICIN SULFATE 40 MG/ML IJ SOLN
350.0000 mg | INTRAVENOUS | Status: AC
  Administered 2015-09-11: 350 mg via INTRAVENOUS
  Filled 2015-09-10: qty 8.75

## 2015-09-10 MED ORDER — ENOXAPARIN SODIUM 40 MG/0.4ML ~~LOC~~ SOLN
40.0000 mg | SUBCUTANEOUS | Status: DC
  Administered 2015-09-10 – 2015-09-17 (×7): 40 mg via SUBCUTANEOUS
  Filled 2015-09-10 (×7): qty 0.4

## 2015-09-10 MED ORDER — GENTAMICIN IN SALINE 1-0.9 MG/ML-% IV SOLN: 100.0000 mg | INTRAVENOUS | Status: DC

## 2015-09-10 MED ORDER — PANTOPRAZOLE SODIUM 40 MG IV SOLR
40.0000 mg | INTRAVENOUS | Status: DC
  Administered 2015-09-10 – 2015-09-15 (×6): 40 mg via INTRAVENOUS
  Filled 2015-09-10 (×6): qty 40

## 2015-09-10 MED ORDER — CLINDAMYCIN PHOSPHATE 900 MG/50ML IV SOLN
900.0000 mg | INTRAVENOUS | Status: AC
  Administered 2015-09-11: 900 mg via INTRAVENOUS
  Filled 2015-09-10: qty 50

## 2015-09-10 MED ORDER — METOPROLOL TARTRATE 1 MG/ML IV SOLN
2.5000 mg | Freq: Four times a day (QID) | INTRAVENOUS | Status: DC
  Administered 2015-09-10 – 2015-09-16 (×23): 2.5 mg via INTRAVENOUS
  Filled 2015-09-10 (×22): qty 5

## 2015-09-10 MED ORDER — DEXTROSE-NACL 5-0.9 % IV SOLN
INTRAVENOUS | Status: DC
  Administered 2015-09-10: 23:00:00 via INTRAVENOUS

## 2015-09-10 MED ORDER — LEVOTHYROXINE SODIUM 100 MCG IV SOLR
25.0000 ug | Freq: Every day | INTRAVENOUS | Status: DC
  Administered 2015-09-10 – 2015-09-15 (×6): 25 ug via INTRAVENOUS
  Filled 2015-09-10 (×7): qty 5

## 2015-09-10 NOTE — Progress Notes (Signed)
I have discussed the case with GI medicine who feels that the area of obstruction in the sigmoid colon is too long to stent. Therefore we will need to proceed with sigmoid colectomy with unprepped bowel and he will require a colostomy. I presented the situation to the patient and his wife. I discussed the surgery in detail including its nature and need for a colostomy and possible open wound. We discussed that it is major surgery with potential complications including anesthetic complications, cardiorespiratory complications, bleeding, infection and possible death. However I do not see any alternative if he cannot continue his current situation and he and his wife understand and agree. We have asked stomal therapy to mark him for a stoma. Of note is his troponins are mildly elevated at 0.14 and 0.15 after a brief episode of chest pain last night. He currently denies any chest pain or shortness of breath. I discussed this with the medicine team and they will have cardiology see the patient preoperatively. His surgery however is not elective.

## 2015-09-10 NOTE — Progress Notes (Signed)
Patient ID: Kenneth [name], male   DOB: 08/11/1928, 81 y.o.   MRN: [id number]    Subjective: Pt still with some abdominal pain.  Minimal flatus, no BM.  Objective: Vital signs in last 24 hours: Temp:  [97.9 F (36.6 C)-98.4 F (36.9 C)] 98.4 F (36.9 C) (10/05 0517) Pulse Rate:  [69-101] 89 (10/05 0517) Resp:  [16-20] 18 (10/05 0517) BP: (123-214)/(68-96) 123/68 mmHg (10/05 0517) SpO2:  [95 %-99 %] 95 % (10/05 0517) Weight:  [70.5 kg (155 lb 6.8 oz)] 70.5 kg (155 lb 6.8 oz) (10/04 1507) Last BM Date: 09/07/15  Intake/Output from previous day: 10/04 0701 - 10/05 0700 In: 307.5 [I.V.:307.5] Out: 525 [Urine:525] Intake/Output this shift:    PE: Abd: soft, still tender mostly in LLQ, few BS, minimally distended, still mostly in upper abdomen Heart: regular Lungs: CTAB  Lab Results:   Recent Labs  09/09/15 1020 09/10/15 0710  WBC 9.5 8.2  HGB 13.1 11.7*  HCT 39.9 35.7*  PLT 341 339   BMET  Recent Labs  09/09/15 1020 09/10/15 0710  NA 138 137  K 3.7 3.6  CL 105 105  CO2 27 24  GLUCOSE 144* 121*  BUN 20 17  CREATININE 0.74 0.66  CALCIUM 9.1 8.5*   PT/INR  Recent Labs  09/09/15 1615  LABPROT 12.9  INR 0.95   CMP     Component Value Date/Time   NA 137 09/10/2015 0710   K 3.6 09/10/2015 0710   CL 105 09/10/2015 0710   CO2 24 09/10/2015 0710   GLUCOSE 121* 09/10/2015 0710   BUN 17 09/10/2015 0710   CREATININE 0.66 09/10/2015 0710   CALCIUM 8.5* 09/10/2015 0710   PROT 7.0 09/09/2015 1020   ALBUMIN 3.5 09/09/2015 1020   AST 21 09/09/2015 1020   ALT 14* 09/09/2015 1020   ALKPHOS 73 09/09/2015 1020   BILITOT 0.5 09/09/2015 1020   GFRNONAA >60 09/10/2015 0710   GFRAA >60 09/10/2015 0710   Lipase     Component Value Date/Time   LIPASE 19* 09/09/2015 1020       Studies/Results: Ct Abdomen Pelvis W Contrast  09/09/2015   CLINICAL DATA:  Left lower quadrant pain for 2 weeks. Decreased bowel movements.  EXAM: CT ABDOMEN AND PELVIS WITH  CONTRAST  TECHNIQUE: Multidetector CT imaging of the abdomen and pelvis was performed using the standard protocol following bolus administration of intravenous contrast.  CONTRAST:  143mL OMNIPAQUE IOHEXOL 300 MG/ML  SOLN  COMPARISON:  08/22/2014  FINDINGS: Stable small calcified granuloma at the left lung base. No evidence pleural effusions.  No evidence for free intraperitoneal air. Again noted is a large hiatal hernia containing most of the stomach. There is no evidence for gastric obstruction or inflammatory changes. Please note that the entire stomach is not visualized. Again noted are two low-density structures in right hepatic lobe, largest measuring 2.4 cm. Findings are most compatible with hepatic cysts. Otherwise, normal appearance the liver and the portal venous system is patent. Normal appearance of the gallbladder. There is a normal appearance of spleen. A small amount of fluid in the left upper quadrant lateral to the spleen on sequence 2, image 16. Multiple calcifications near the pancreatic head suggestive for old pancreas inflammation. Stable fullness of the right adrenal gland is unchanged. Normal appearance of the left adrenal gland. There is an 8 mm nonobstructive stone in the right kidney lower pole. Normal appearance of the left kidney.  Atherosclerotic calcifications involving the aorta and iliac arteries without  aneurysm.  No gross abnormality to the urinary bladder or prostate. Small-moderate amount of fluid along the right side of the pelvis on sequence 2, image 65. Again noted is extensive wall thickening involving the sigmoid colon with diverticula. This abnormal wall thickening in the sigmoid colon involves a 10 cm segment. The descending colon is moderately distended with stool. There is gas and stool throughout the transverse colon and right colon. Mild inflammatory changes around the descending colon, best seen on sequence 2, image 32. In addition, there are prominent lymph nodes in  the sigmoid mesocolon and along the drainage of the sigmoid colon, best seen on sequence 2, image 50.  No significant dilatation of small bowel loops. Previously, there was a small hernia in the left lower abdomen which has either been repaired or less conspicuous.  No acute bone abnormality. Disc space and endplates changes L8-G5 and L5-S1.  IMPRESSION: Abnormal sigmoid colon with diffuse wall thickening and concern for a colonic mass. The colon proximal to this abnormal sigmoid colon is distended with a large amount of stool and pericolonic inflammatory changes. There is a small amount of fluid in left upper quadrant and small to moderate amount of fluid in the pelvis. In addition, there are multiple lymph nodes throughout the sigmoid mesocolon region. Findings are highly concerning for a neoplastic process in the sigmoid colon causing at least partial obstruction and inflammatory changes in the descending colon.  Large hiatal hernia containing most of the stomach.  Nonobstructive right kidney stone.  These results were called by telephone at the time of interpretation on 09/09/2015 at 12:42 pm to Dr. Carmin Muskrat , who verbally acknowledged these results.   Electronically Signed   By: Markus Daft M.D.   On: 09/09/2015 12:46    Anti-infectives: Anti-infectives    None       Assessment/Plan  1. Colonic obstruction, secondary to neoplasm or diverticular stricture -CEA normal at 4 -have called GI to eval for flex sig +/- stent placement.  Given location and length of "mass" I think this is likely not possible, but will defer to GI.  If he is able to be stented, then he could be prepped so we could do a one stage procedure vs unprepped he will get a Hartman's (colectomy/colostomy) -cont NPO -will follow closely   LOS: 1 day    Alesana Magistro E 09/10/2015, 9:37 AM Pager: [phone]

## 2015-09-10 NOTE — Consult Note (Signed)
[facility] CONSULT Reason for consult: obstructived colon Referring Physician: [facility] hospitalist. PCP: Dr. Jolene Schimke is an 81 y.o. male.  HPI: nice 81 year old gentleman who has had left lower quadrant pain but no fever or chills. This is been going on for several months with worsening constipation. He had a colonoscopy by Dr. Wynetta Emery in 2014 that revealed and diverticulosis with a question of ischemic colitis at that time. Since then he's continued to have constipation and have problems. He said recent abdominal pain and has not had bowel movements for several days at a time despite the use of various stool softeners. 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 There were some surrounding lymph nodes but no clear surrounding inflammation. It was felt that the colon was mostly obstructed in the proximal colon was dilated. We were asked to see the patient to evaluate for possibility of placing a colonic stent to aid in cleaning out: preoperatively.  Past Medical History  Diagnosis Date  . Diabetes ([place])   . HTN (hypertension)   . Coronary atherosclerosis of unspecified type of vessel, native or graft     CABG 1993, LIMA to LAD, SVG to obtuse marginal one, SVG to posterior descending, stable angina  . Hyperlipidemia `  . GERD (gastroesophageal reflux disease)   . Chronic rhinitis   . Glaucoma   . Melanoma of skin, site unspecified   . Hernia     left side  . Complication of anesthesia   . PONV (postoperative nausea and vomiting)   . Anemia     on iron  . [facility] spotted fever 2-3 yrs ago  . CAD (coronary artery disease)   . Diverticulosis of colon   . Anginal pain ([place])     last 3 days ago goes away if stop and rest  . Dysrhythmia     occ "skips a beat"  . History of kidney stones   . H/O hiatal hernia   . Arthritis     Past Surgical History   Procedure Laterality Date  . Right leg fracture age 65    . Coronary artery bypass graft  1990's    x 3  . Tonsillectomy  age 38 or 9  . Colonoscopy with propofol N/A 11/27/2013    Procedure: COLONOSCOPY WITH PROPOFOL;  Surgeon: Garlan Fair, MD;  Location: [facility];  Service: Endoscopy;  Laterality: N/A;  . Inguinal hernia repair Left 06/28/2014    Procedure: Left  INGUINAL HERNIA REPAIR ;  Surgeon: Merrie Roof, MD;  Location: [facility];  Service: General;  Laterality: Left;  . Insertion of mesh Left 06/28/2014    Procedure: INSERTION OF MESH;  Surgeon: Merrie Roof, MD;  Location: [facility];  Service: General;  Laterality: Left;  . Hernia repair  [id number]    lft ing hernia  . Insertion of mesh N/A 10/11/2014  . Laparoscopy  10/11/2014  . Ventral hernia repair  10/11/2014    Procedure: HERNIA REPAIR VENTRAL ADULT;  Surgeon: Autumn Messing III, MD;  Location: [facility];  Service: General;;    Family History  Problem Relation Age of Onset  . CAD Father   . Hyperlipidemia Father   . Cancer Mother     Social History:  reports that he has been smoking Cigars.  He has never used smokeless tobacco. He reports that he does  not drink alcohol or use illicit drugs.  Allergies:  Allergies  Allergen Reactions  . Aspirin Other (See Comments)    Bleeding.    . Lipitor [Atorvastatin] Other (See Comments)    tired  . Lopressor [Metoprolol Tartrate] Other (See Comments)    Sleepy all the time  . Penicillins Hives and Rash    Has patient had a PCN reaction causing immediate rash, facial/tongue/throat swelling, SOB or lightheadedness with hypotension: No Has patient had a PCN reaction causing severe rash involving mucus membranes or skin necrosis: No Has patient had a PCN reaction that required hospitalization No Has patient had a PCN reaction occurring within the last 10 years: Yes If all of the above answers are "NO", then may proceed with Cephalosporin use.     Medications; Prior to  Admission medications   Medication Sig Start Date End Date Taking? Authorizing Provider  acetaminophen (TYLENOL) 325 MG tablet Take 325 mg by mouth every 6 (six) hours as needed for mild pain.    Yes Historical Provider, MD  Cyanocobalamin (VITAMIN B-12 PO) Take 1 tablet by mouth daily.   Yes Historical Provider, MD  ferrous sulfate 325 (65 FE) MG tablet Take 325 mg by mouth daily with breakfast.   Yes Historical Provider, MD  hydrochlorothiazide (HYDRODIURIL) 25 MG tablet Take 12.5 mg by mouth daily.  10/02/14  Yes Historical Provider, MD  ipratropium (ATROVENT) 0.03 % nasal spray Place 2 sprays into both nostrils 2 (two) times daily as needed for rhinitis.   Yes Historical Provider, MD  isosorbide mononitrate (IMDUR) 60 MG 24 hr tablet Take 60 mg by mouth every evening.    Yes Historical Provider, MD  latanoprost (XALATAN) 0.005 % ophthalmic solution Place 1 drop into both eyes at bedtime.   Yes Historical Provider, MD  levothyroxine (SYNTHROID, LEVOTHROID) 50 MCG tablet Take 50 mcg by mouth daily before breakfast.   Yes Historical Provider, MD  metFORMIN (GLUCOPHAGE) 1000 MG tablet Take 500 mg by mouth daily with breakfast.    Yes Historical Provider, MD  metoprolol succinate (TOPROL-XL) 50 MG 24 hr tablet TAKE 1 TABLET DAILY Patient taking differently: TAKE 1/2 TABLET DAILY 11/05/14  Yes Belva Crome, MD  Multiple Vitamin (MULTIVITAMIN WITH MINERALS) TABS tablet Take 1 tablet by mouth daily.   Yes Historical Provider, MD  nitroGLYCERIN (NITROSTAT) 0.4 MG SL tablet Place 0.4 mg under the tongue every 5 (five) minutes as needed for chest pain.   Yes Historical Provider, MD  pantoprazole (PROTONIX) 40 MG tablet Take 40 mg by mouth daily.   Yes Historical Provider, MD  potassium chloride (K-DUR,KLOR-CON) 10 MEQ tablet Take 10 mEq by mouth daily.    Yes Historical Provider, MD  simvastatin (ZOCOR) 20 MG tablet Take 20 mg by mouth every evening.   Yes Historical Provider, MD   . Derrill Memo ON 09/11/2015]  clindamycin (CLEOCIN) IV  900 mg Intravenous On Call to OR  . enoxaparin (LOVENOX) injection  40 mg Subcutaneous Q24H  . [START ON 09/11/2015] gentamicin  350 mg Intravenous On Call to OR  . insulin aspart  0-9 Units Subcutaneous TID WC  . latanoprost  1 drop Both Eyes QHS  . levothyroxine  25 mcg Intravenous Daily  . metoprolol  2.5 mg Intravenous 4 times per day  . pantoprazole (PROTONIX) IV  40 mg Intravenous Q24H   PRN Meds acetaminophen, hydrALAZINE, HYDROmorphone (DILAUDID) injection, ondansetron **OR** ondansetron (ZOFRAN) IV, promethazine Results for orders placed or performed during the hospital encounter of 09/09/15 (from  the past 48 hour(s))  Lipase, blood     Status: Abnormal   Collection Time: 09/09/15 10:20 AM  Result Value Ref Range   Lipase 19 (L) 22 - 51 U/L  Comprehensive metabolic panel     Status: Abnormal   Collection Time: 09/09/15 10:20 AM  Result Value Ref Range   Sodium 138 135 - 145 mmol/L   Potassium 3.7 3.5 - 5.1 mmol/L   Chloride 105 101 - 111 mmol/L   CO2 27 22 - 32 mmol/L   Glucose, Bld 144 (H) 65 - 99 mg/dL   BUN 20 6 - 20 mg/dL   Creatinine, Ser 0.74 0.61 - 1.24 mg/dL   Calcium 9.1 8.9 - 10.3 mg/dL   Total Protein 7.0 6.5 - 8.1 g/dL   Albumin 3.5 3.5 - 5.0 g/dL   AST 21 15 - 41 U/L   ALT 14 (L) 17 - 63 U/L   Alkaline Phosphatase 73 38 - 126 U/L   Total Bilirubin 0.5 0.3 - 1.2 mg/dL   GFR calc non Af Amer >60 >60 mL/min   GFR calc Af Amer >60 >60 mL/min    Comment: (NOTE) The eGFR has been calculated using the CKD EPI equation. This calculation has not been validated in all clinical situations. eGFR's persistently <60 mL/min signify possible Chronic Kidney Disease.    Anion gap 6 5 - 15  CBC     Status: None   Collection Time: 09/09/15 10:20 AM  Result Value Ref Range   WBC 9.5 4.0 - 10.5 K/uL   RBC 4.23 4.22 - 5.81 MIL/uL   Hemoglobin 13.1 13.0 - 17.0 g/dL   HCT 39.9 39.0 - 52.0 %   MCV 94.3 78.0 - 100.0 fL   MCH 31.0 26.0 - 34.0 pg    MCHC 32.8 30.0 - 36.0 g/dL   RDW 13.3 11.5 - 15.5 %   Platelets 341 150 - 400 K/uL  Urinalysis, Routine w reflex microscopic (not at [facility])     Status: Abnormal   Collection Time: 09/09/15  1:01 PM  Result Value Ref Range   Color, Urine YELLOW YELLOW   APPearance CLEAR CLEAR   Specific Gravity, Urine 1.046 (H) 1.005 - 1.030   pH 7.5 5.0 - 8.0   Glucose, UA NEGATIVE NEGATIVE mg/dL   Hgb urine dipstick NEGATIVE NEGATIVE   Bilirubin Urine NEGATIVE NEGATIVE   Ketones, ur NEGATIVE NEGATIVE mg/dL   Protein, ur NEGATIVE NEGATIVE mg/dL   Urobilinogen, UA 1.0 0.0 - 1.0 mg/dL   Nitrite NEGATIVE NEGATIVE   Leukocytes, UA NEGATIVE NEGATIVE    Comment: MICROSCOPIC NOT DONE ON URINES WITH NEGATIVE PROTEIN, BLOOD, LEUKOCYTES, NITRITE, OR GLUCOSE <1000 mg/dL.  CEA     Status: None   Collection Time: 09/09/15  4:15 PM  Result Value Ref Range   CEA 4.0 0.0 - 4.7 ng/mL    Comment: (NOTE)       Roche ECLIA methodology       Nonsmokers  <3.9                                     Smokers     <5.6 Performed At: [facility] [place], [place] [id number] Lindon Romp MD TD:[id number]   Protime-INR     Status: None   Collection Time: 09/09/15  4:15 PM  Result Value Ref Range   Prothrombin Time 12.9 11.6 - 15.2 seconds  INR 0.95 0.00 - 1.49  Glucose, capillary     Status: Abnormal   Collection Time: 09/09/15  5:41 PM  Result Value Ref Range   Glucose-Capillary 104 (H) 65 - 99 mg/dL   Comment 1 Notify RN   Troponin I (q 6hr x 3)     Status: None   Collection Time: 09/09/15  6:22 PM  Result Value Ref Range   Troponin I <0.03 <0.031 ng/mL    Comment:        NO INDICATION OF MYOCARDIAL INJURY.   Glucose, capillary     Status: Abnormal   Collection Time: 09/09/15  9:16 PM  Result Value Ref Range   Glucose-Capillary 135 (H) 65 - 99 mg/dL  Troponin I (q 6hr x 3)     Status: Abnormal   Collection Time: 09/10/15  7:10 AM  Result Value Ref Range   Troponin I 0.14 (H) <0.031  ng/mL    Comment:        PERSISTENTLY INCREASED TROPONIN VALUES IN THE RANGE OF 0.04-0.49 ng/mL CAN BE SEEN IN:       -UNSTABLE ANGINA       -CONGESTIVE HEART FAILURE       -MYOCARDITIS       -CHEST TRAUMA       -ARRYHTHMIAS       -LATE PRESENTING MYOCARDIAL INFARCTION       -COPD   CLINICAL FOLLOW-UP RECOMMENDED.   Basic metabolic panel     Status: Abnormal   Collection Time: 09/10/15  7:10 AM  Result Value Ref Range   Sodium 137 135 - 145 mmol/L   Potassium 3.6 3.5 - 5.1 mmol/L   Chloride 105 101 - 111 mmol/L   CO2 24 22 - 32 mmol/L   Glucose, Bld 121 (H) 65 - 99 mg/dL   BUN 17 6 - 20 mg/dL   Creatinine, Ser 0.66 0.61 - 1.24 mg/dL   Calcium 8.5 (L) 8.9 - 10.3 mg/dL   GFR calc non Af Amer >60 >60 mL/min   GFR calc Af Amer >60 >60 mL/min    Comment: (NOTE) The eGFR has been calculated using the CKD EPI equation. This calculation has not been validated in all clinical situations. eGFR's persistently <60 mL/min signify possible Chronic Kidney Disease.    Anion gap 8 5 - 15  CBC     Status: Abnormal   Collection Time: 09/10/15  7:10 AM  Result Value Ref Range   WBC 8.2 4.0 - 10.5 K/uL   RBC 3.75 (L) 4.22 - 5.81 MIL/uL   Hemoglobin 11.7 (L) 13.0 - 17.0 g/dL   HCT 35.7 (L) 39.0 - 52.0 %   MCV 95.2 78.0 - 100.0 fL   MCH 31.2 26.0 - 34.0 pg   MCHC 32.8 30.0 - 36.0 g/dL   RDW 13.5 11.5 - 15.5 %   Platelets 339 150 - 400 K/uL  Glucose, capillary     Status: Abnormal   Collection Time: 09/10/15  8:31 AM  Result Value Ref Range   Glucose-Capillary 119 (H) 65 - 99 mg/dL  Glucose, capillary     Status: Abnormal   Collection Time: 09/10/15 12:30 PM  Result Value Ref Range   Glucose-Capillary 117 (H) 65 - 99 mg/dL  Troponin I (q 6hr x 3)     Status: Abnormal   Collection Time: 09/10/15 12:47 PM  Result Value Ref Range   Troponin I 0.15 (H) <0.031 ng/mL    Comment:  PERSISTENTLY INCREASED TROPONIN VALUES IN THE RANGE OF 0.04-0.49 ng/mL CAN BE SEEN IN:        -UNSTABLE ANGINA       -CONGESTIVE HEART FAILURE       -MYOCARDITIS       -CHEST TRAUMA       -ARRYHTHMIAS       -LATE PRESENTING MYOCARDIAL INFARCTION       -COPD   CLINICAL FOLLOW-UP RECOMMENDED.     Ct Abdomen Pelvis W Contrast  09/09/2015   CLINICAL DATA:  Left lower quadrant pain for 2 weeks. Decreased bowel movements.  EXAM: CT ABDOMEN AND PELVIS WITH CONTRAST  TECHNIQUE: Multidetector CT imaging of the abdomen and pelvis was performed using the standard protocol following bolus administration of intravenous contrast.  CONTRAST:  121m OMNIPAQUE IOHEXOL 300 MG/ML  SOLN  COMPARISON:  08/22/2014  FINDINGS: Stable small calcified granuloma at the left lung base. No evidence pleural effusions.  No evidence for free intraperitoneal air. Again noted is a large hiatal hernia containing most of the stomach. There is no evidence for gastric obstruction or inflammatory changes. Please note that the entire stomach is not visualized. Again noted are two low-density structures in right hepatic lobe, largest measuring 2.4 cm. Findings are most compatible with hepatic cysts. Otherwise, normal appearance the liver and the portal venous system is patent. Normal appearance of the gallbladder. There is a normal appearance of spleen. A small amount of fluid in the left upper quadrant lateral to the spleen on sequence 2, image 16. Multiple calcifications near the pancreatic head suggestive for old pancreas inflammation. Stable fullness of the right adrenal gland is unchanged. Normal appearance of the left adrenal gland. There is an 8 mm nonobstructive stone in the right kidney lower pole. Normal appearance of the left kidney.  Atherosclerotic calcifications involving the aorta and iliac arteries without aneurysm.  No gross abnormality to the urinary bladder or prostate. Small-moderate amount of fluid along the right side of the pelvis on sequence 2, image 65. Again noted is extensive wall thickening involving the  sigmoid colon with diverticula. This abnormal wall thickening in the sigmoid colon involves a 10 cm segment. The descending colon is moderately distended with stool. There is gas and stool throughout the transverse colon and right colon. Mild inflammatory changes around the descending colon, best seen on sequence 2, image 32. In addition, there are prominent lymph nodes in the sigmoid mesocolon and along the drainage of the sigmoid colon, best seen on sequence 2, image 50.  No significant dilatation of small bowel loops. Previously, there was a small hernia in the left lower abdomen which has either been repaired or less conspicuous.  No acute bone abnormality. Disc space and endplates changes LO9-B3and L5-S1.  IMPRESSION: Abnormal sigmoid colon with diffuse wall thickening and concern for a colonic mass. The colon proximal to this abnormal sigmoid colon is distended with a large amount of stool and pericolonic inflammatory changes. There is a small amount of fluid in left upper quadrant and small to moderate amount of fluid in the pelvis. In addition, there are multiple lymph nodes throughout the sigmoid mesocolon region. Findings are highly concerning for a neoplastic process in the sigmoid colon causing at least partial obstruction and inflammatory changes in the descending colon.  Large hiatal hernia containing most of the stomach.  Nonobstructive right kidney stone.  These results were called by telephone at the time of interpretation on 09/09/2015 at 12:42 pm to Dr. RCarmin Muskrat,  who verbally acknowledged these results.   Electronically Signed   By: Markus Daft M.D.   On: 09/09/2015 12:46               Blood pressure 144/56, pulse 64, temperature 98.5 F (36.9 C), temperature source Oral, resp. rate 18, height _0  (1.803 m), weight 70.5 kg (155 lb 6.8 oz), SpO2 95 %.  Physical exam:   General-- pleasant white male who was alert and in no distress. He is oriented. ENT--  nonicteric Neck-no lymphadenopathy- Heart-- regular rate and rhythm without murmurs or gallops Lungs-- clear Abdomen-- slight tympanic and distention but fairly minimal. Bowel sounds normal. Fullness in the lower abdomen Psych-- alert and oriented.   Assessment: 1. Large sigmoid colon mass with obstruction. I have reviewed this with my partners who put in colonic stents. Given the large size of this mass it was not felt that this is amenable to colonic stenting.  Plan: agree with going ahead with colonic resection. If explaining this to the patient and his wife and have told them that it was considered to place a temporary colonic stent but it was not felt that this was feasible due to the large size of this colonic mass and that he should go ahead with surgery. They ask questions and these were answered.   Orman Matsumura JR,Maybelline Kolarik L 09/10/2015, 5:04 PM   Pager: [phone] If no answer or after hours call [phone]

## 2015-09-10 NOTE — Progress Notes (Signed)
Spoke with pt concerning discharge plans and needs.  Pt declined [place] at present time.

## 2015-09-10 NOTE — Progress Notes (Signed)
[facility] PROGRESS NOTE  JAKIM DRAPEAU WUJ:[id number] DOB: December 12, 1927 DOA: 09/09/2015 PCP: Irven Shelling, MD  Assessment/Plan: 1. Elevated troponin- likely from demand ischemia as patient was hypertensive yesterday with blood pressure as high as 184/87. Patient currently denies any pain. Will obtain EKG and continue cycle cardiac enzymes. Have called cardiology consultation, we will see the patient in a.m. before surgery. Will obtain EKG. 2. Hypertension- patient is currently nothing by mouth, will start the patient on IV metoprolol 2.5 mg every 6 hours. continue when necessary hydralazine  3.  Diabetes mellitus-patient is currently nothing by mouth, continue sliding scale insulin with NovoLog 4. Sigmoid colon mass with obstruction- patient was evaluated by GI, and G and not candidate for chronic stent given the large size of the mass. Plan is for colectomy in a.m. 5. DVT prophylaxis- Lovenox  Code Status: Full code Family Communication: *No family at bedside Disposition Plan: Patient to undergo colectomy in a.m. likely skilled nursing facility at the time of discharge   Consultants:  Surgery  Gastroenterology  Procedures:  None  Antibiotics:  None  HPI/Subjective: 81 year old male who came to the hospital with complaint of several months duration of progressively worsening left lower quadrant abdominal pain the pain has been getting worse and now 10/10 in intensity. In the ED imaging study showed colonic mass with question small bowel obstruction. General surgery has been consulted, and they recommended gastroenterology to see the patient for possible colonic  Stenting. Last night patient had mild chest pain and today mild elevation of troponin 0.14/0.15. Currently patient denies any chest pain. He has no history of CAD.   Objective: Filed Vitals:   09/10/15 1349  BP: 144/56  Pulse: 64  Temp: 98.5 F (36.9 C)  Resp: 18    Intake/Output Summary (Last 24  hours) at 09/10/15 1746 Last data filed at 09/10/15 0636  Gross per 24 hour  Intake  307.5 ml  Output    325 ml  Net  -17.5 ml   Filed Weights   09/09/15 1507  Weight: 70.5 kg (155 lb 6.8 oz)    Exam:   General:  Appears in no acute distress  Cardiovascular: S1-S2 regular  Respiratory: Clear to auscultation bilaterally  Abdomen: Soft, mild tenderness in the left lower quadrant, no rigidity, no guarding ,no organomegaly  Musculoskeletal: No edema/cyanosis/clubbing noted in the lower extremities   Data Reviewed: Basic Metabolic Panel:  Recent Labs Lab 09/09/15 1020 09/10/15 0710  NA 138 137  K 3.7 3.6  CL 105 105  CO2 27 24  GLUCOSE 144* 121*  BUN 20 17  CREATININE 0.74 0.66  CALCIUM 9.1 8.5*   Liver Function Tests:  Recent Labs Lab 09/09/15 1020  AST 21  ALT 14*  ALKPHOS 73  BILITOT 0.5  PROT 7.0  ALBUMIN 3.5    Recent Labs Lab 09/09/15 1020  LIPASE 19*   No results for input(s): AMMONIA in the last 168 hours. CBC:  Recent Labs Lab 09/09/15 1020 09/10/15 0710  WBC 9.5 8.2  HGB 13.1 11.7*  HCT 39.9 35.7*  MCV 94.3 95.2  PLT 341 339   Cardiac Enzymes:  Recent Labs Lab 09/09/15 1822 09/10/15 0710 09/10/15 1247  TROPONINI <0.03 0.14* 0.15*   BNP (last 3 results) No results for input(s): BNP in the last 8760 hours.  ProBNP (last 3 results) No results for input(s): PROBNP in the last 8760 hours.  CBG:  Recent Labs Lab 09/09/15 1741 09/09/15 2116 09/10/15 0831 09/10/15 1230 09/10/15 1710  GLUCAP 104* 135* 119* 117* 100*    No results found for this or any previous visit (from the past 240 hour(s)).   Studies: Ct Abdomen Pelvis W Contrast  09/09/2015   CLINICAL DATA:  Left lower quadrant pain for 2 weeks. Decreased bowel movements.  EXAM: CT ABDOMEN AND PELVIS WITH CONTRAST  TECHNIQUE: Multidetector CT imaging of the abdomen and pelvis was performed using the standard protocol following bolus administration of intravenous  contrast.  CONTRAST:  142mL OMNIPAQUE IOHEXOL 300 MG/ML  SOLN  COMPARISON:  08/22/2014  FINDINGS: Stable small calcified granuloma at the left lung base. No evidence pleural effusions.  No evidence for free intraperitoneal air. Again noted is a large hiatal hernia containing most of the stomach. There is no evidence for gastric obstruction or inflammatory changes. Please note that the entire stomach is not visualized. Again noted are two low-density structures in right hepatic lobe, largest measuring 2.4 cm. Findings are most compatible with hepatic cysts. Otherwise, normal appearance the liver and the portal venous system is patent. Normal appearance of the gallbladder. There is a normal appearance of spleen. A small amount of fluid in the left upper quadrant lateral to the spleen on sequence 2, image 16. Multiple calcifications near the pancreatic head suggestive for old pancreas inflammation. Stable fullness of the right adrenal gland is unchanged. Normal appearance of the left adrenal gland. There is an 8 mm nonobstructive stone in the right kidney lower pole. Normal appearance of the left kidney.  Atherosclerotic calcifications involving the aorta and iliac arteries without aneurysm.  No gross abnormality to the urinary bladder or prostate. Small-moderate amount of fluid along the right side of the pelvis on sequence 2, image 65. Again noted is extensive wall thickening involving the sigmoid colon with diverticula. This abnormal wall thickening in the sigmoid colon involves a 10 cm segment. The descending colon is moderately distended with stool. There is gas and stool throughout the transverse colon and right colon. Mild inflammatory changes around the descending colon, best seen on sequence 2, image 32. In addition, there are prominent lymph nodes in the sigmoid mesocolon and along the drainage of the sigmoid colon, best seen on sequence 2, image 50.  No significant dilatation of small bowel loops.  Previously, there was a small hernia in the left lower abdomen which has either been repaired or less conspicuous.  No acute bone abnormality. Disc space and endplates changes U5-K2 and L5-S1.  IMPRESSION: Abnormal sigmoid colon with diffuse wall thickening and concern for a colonic mass. The colon proximal to this abnormal sigmoid colon is distended with a large amount of stool and pericolonic inflammatory changes. There is a small amount of fluid in left upper quadrant and small to moderate amount of fluid in the pelvis. In addition, there are multiple lymph nodes throughout the sigmoid mesocolon region. Findings are highly concerning for a neoplastic process in the sigmoid colon causing at least partial obstruction and inflammatory changes in the descending colon.  Large hiatal hernia containing most of the stomach.  Nonobstructive right kidney stone.  These results were called by telephone at the time of interpretation on 09/09/2015 at 12:42 pm to Dr. Carmin Muskrat , who verbally acknowledged these results.   Electronically Signed   By: Markus Daft M.D.   On: 09/09/2015 12:46    Scheduled Meds: . [START ON 09/11/2015] clindamycin (CLEOCIN) IV  900 mg Intravenous On Call to OR  . enoxaparin (LOVENOX) injection  40 mg Subcutaneous Q24H  . [  START ON 09/11/2015] gentamicin  350 mg Intravenous On Call to OR  . insulin aspart  0-9 Units Subcutaneous TID WC  . latanoprost  1 drop Both Eyes QHS  . levothyroxine  25 mcg Intravenous Daily  . metoprolol  2.5 mg Intravenous 4 times per day  . pantoprazole (PROTONIX) IV  40 mg Intravenous Q24H   Continuous Infusions: . sodium chloride 75 mL/hr at 09/10/15 1703    Principal Problem:   Nausea and vomiting Active Problems:   Hyperlipidemia   Nephrolithiasis   Glaucoma   Small bowel obstruction (HCC)   Hypertensive urgency   Type II diabetes mellitus with ophthalmic manifestations, glaucoma   Colonic mass   Intestinal obstruction ([place])    Time spent:  25 min    [facility]  [facility] Pager [phone]*. If 7PM-7AM, please contact night-coverage at [email or web address], password [facility] 09/10/2015, 5:46 PM  LOS: 1 day

## 2015-09-11 ENCOUNTER — Inpatient Hospital Stay (HOSPITAL_COMMUNITY): Payer: Medicare Other | Admitting: Anesthesiology

## 2015-09-11 ENCOUNTER — Encounter (HOSPITAL_COMMUNITY): Payer: Self-pay | Admitting: Anesthesiology

## 2015-09-11 ENCOUNTER — Encounter (HOSPITAL_COMMUNITY)
Admission: EM | Disposition: A | Discharge status: Home Health | Payer: Self-pay | Source: Home / Self Care | Attending: Family Medicine

## 2015-09-11 DIAGNOSIS — Z01810 Encounter for preprocedural cardiovascular examination: Secondary | ICD-10-CM

## 2015-09-11 DIAGNOSIS — I25710 Atherosclerosis of autologous vein coronary artery bypass graft(s) with unstable angina pectoris: Secondary | ICD-10-CM

## 2015-09-11 HISTORY — PX: PARTIAL COLECTOMY: SHX5273

## 2015-09-11 HISTORY — PX: LAPAROTOMY: SHX154

## 2015-09-11 LAB — COMPREHENSIVE METABOLIC PANEL
ALT: 10 U/L — ABNORMAL LOW (ref 17–63)
ANION GAP: 9 (ref 5–15)
AST: 15 U/L (ref 15–41)
Albumin: 2.9 g/dL — ABNORMAL LOW (ref 3.5–5.0)
Alkaline Phosphatase: 59 U/L (ref 38–126)
BILIRUBIN TOTAL: 0.6 mg/dL (ref 0.3–1.2)
BUN: 18 mg/dL (ref 6–20)
CO2: 24 mmol/L (ref 22–32)
Calcium: 8.5 mg/dL — ABNORMAL LOW (ref 8.9–10.3)
Chloride: 107 mmol/L (ref 101–111)
Creatinine, Ser: 0.75 mg/dL (ref 0.61–1.24)
Glucose, Bld: 116 mg/dL — ABNORMAL HIGH (ref 65–99)
Potassium: 3.7 mmol/L (ref 3.5–5.1)
Sodium: 140 mmol/L (ref 135–145)
TOTAL PROTEIN: 5.9 g/dL — AB (ref 6.5–8.1)

## 2015-09-11 LAB — TYPE AND SCREEN
ABO/RH(D): A POS
Antibody Screen: NEGATIVE

## 2015-09-11 LAB — TROPONIN I
TROPONIN I: 0.07 ng/mL — AB (ref <0.031)
Troponin I: 0.07 ng/mL — ABNORMAL HIGH (ref <0.031)

## 2015-09-11 LAB — SURGICAL PCR SCREEN
MRSA, PCR: NEGATIVE
STAPHYLOCOCCUS AUREUS: NEGATIVE

## 2015-09-11 LAB — GLUCOSE, CAPILLARY
GLUCOSE-CAPILLARY: 97 mg/dL (ref 65–99)
GLUCOSE-CAPILLARY: 112 mg/dL — AB (ref 65–99)
GLUCOSE-CAPILLARY: 164 mg/dL — AB (ref 65–99)
GLUCOSE-CAPILLARY: 184 mg/dL — AB (ref 65–99)
GLUCOSE-CAPILLARY: 183 mg/dL — AB (ref 65–99)
Glucose-Capillary: 114 mg/dL — ABNORMAL HIGH (ref 65–99)
Glucose-Capillary: 121 mg/dL — ABNORMAL HIGH (ref 65–99)
Glucose-Capillary: 214 mg/dL — ABNORMAL HIGH (ref 65–99)
Glucose-Capillary: 92 mg/dL (ref 65–99)

## 2015-09-11 LAB — ABO/RH: ABO/RH(D): A POS

## 2015-09-11 SURGERY — LAPAROTOMY, EXPLORATORY
Anesthesia: General

## 2015-09-11 MED ORDER — SUCCINYLCHOLINE CHLORIDE 20 MG/ML IJ SOLN
INTRAMUSCULAR | Status: DC | PRN
  Administered 2015-09-11: 100 mg via INTRAVENOUS

## 2015-09-11 MED ORDER — ESMOLOL HCL 10 MG/ML IV SOLN
INTRAVENOUS | Status: DC | PRN
  Administered 2015-09-11 (×4): 10 mg via INTRAVENOUS

## 2015-09-11 MED ORDER — FENTANYL CITRATE (PF) 100 MCG/2ML IJ SOLN
25.0000 ug | INTRAMUSCULAR | Status: DC | PRN
  Administered 2015-09-11: 50 ug via INTRAVENOUS

## 2015-09-11 MED ORDER — ESMOLOL HCL 10 MG/ML IV SOLN
INTRAVENOUS | Status: AC
  Filled 2015-09-11: qty 10

## 2015-09-11 MED ORDER — METOCLOPRAMIDE HCL 5 MG/ML IJ SOLN
INTRAMUSCULAR | Status: DC | PRN
  Administered 2015-09-11: 10 mg via INTRAVENOUS

## 2015-09-11 MED ORDER — SUGAMMADEX SODIUM 200 MG/2ML IV SOLN
INTRAVENOUS | Status: DC | PRN
  Administered 2015-09-11: 500 mg via INTRAVENOUS

## 2015-09-11 MED ORDER — LABETALOL HCL 5 MG/ML IV SOLN
INTRAVENOUS | Status: DC | PRN
  Administered 2015-09-11: 5 mg via INTRAVENOUS
  Administered 2015-09-11: 2.5 mg via INTRAVENOUS
  Administered 2015-09-11: 5 mg via INTRAVENOUS
  Administered 2015-09-11: 2.5 mg via INTRAVENOUS
  Administered 2015-09-11: 5 mg via INTRAVENOUS

## 2015-09-11 MED ORDER — FENTANYL CITRATE (PF) 100 MCG/2ML IJ SOLN
INTRAMUSCULAR | Status: AC
  Filled 2015-09-11: qty 2

## 2015-09-11 MED ORDER — MEPERIDINE HCL 50 MG/ML IJ SOLN: 6.2500 mg | INTRAMUSCULAR | Status: DC | PRN

## 2015-09-11 MED ORDER — HYDRALAZINE HCL 20 MG/ML IJ SOLN
10.0000 mg | Freq: Four times a day (QID) | INTRAMUSCULAR | Status: DC | PRN
  Administered 2015-09-13 – 2015-09-15 (×2): 10 mg via INTRAVENOUS
  Filled 2015-09-11 (×3): qty 1

## 2015-09-11 MED ORDER — LACTATED RINGERS IV SOLN
INTRAVENOUS | Status: DC
  Administered 2015-09-11: 11:00:00 via INTRAVENOUS

## 2015-09-11 MED ORDER — HYDROMORPHONE HCL 2 MG/ML IJ SOLN
INTRAMUSCULAR | Status: AC
  Filled 2015-09-11: qty 1

## 2015-09-11 MED ORDER — HYDROMORPHONE HCL 1 MG/ML IJ SOLN
1.0000 mg | INTRAMUSCULAR | Status: DC | PRN
  Administered 2015-09-11 – 2015-09-15 (×24): 1 mg via INTRAVENOUS
  Filled 2015-09-11 (×24): qty 1

## 2015-09-11 MED ORDER — LACTATED RINGERS IV SOLN
INTRAVENOUS | Status: DC | PRN
  Administered 2015-09-11 (×2): via INTRAVENOUS

## 2015-09-11 MED ORDER — FENTANYL CITRATE (PF) 250 MCG/5ML IJ SOLN
INTRAMUSCULAR | Status: AC
Start: 2015-09-11 — End: 2015-09-11
  Filled 2015-09-11: qty 25

## 2015-09-11 MED ORDER — ONDANSETRON HCL 4 MG/2ML IJ SOLN
INTRAMUSCULAR | Status: AC
  Filled 2015-09-11: qty 2

## 2015-09-11 MED ORDER — LABETALOL HCL 5 MG/ML IV SOLN
INTRAVENOUS | Status: AC
  Filled 2015-09-11: qty 4

## 2015-09-11 MED ORDER — DEXTROSE-NACL 5-0.9 % IV SOLN
INTRAVENOUS | Status: DC
  Administered 2015-09-11 – 2015-09-12 (×3): via INTRAVENOUS
  Administered 2015-09-12: 100 mL/h via INTRAVENOUS
  Administered 2015-09-13 – 2015-09-15 (×5): via INTRAVENOUS

## 2015-09-11 MED ORDER — PHENYLEPHRINE HCL 10 MG/ML IJ SOLN
INTRAMUSCULAR | Status: DC | PRN
  Administered 2015-09-11 (×2): 80 ug via INTRAVENOUS

## 2015-09-11 MED ORDER — CLINDAMYCIN PHOSPHATE 900 MG/50ML IV SOLN
INTRAVENOUS | Status: AC
  Filled 2015-09-11: qty 50

## 2015-09-11 MED ORDER — SCOPOLAMINE 1 MG/3DAYS TD PT72
MEDICATED_PATCH | TRANSDERMAL | Status: AC
  Filled 2015-09-11: qty 1

## 2015-09-11 MED ORDER — LABETALOL HCL 5 MG/ML IV SOLN
10.0000 mg | INTRAVENOUS | Status: DC | PRN
Start: 2015-09-11 — End: 2015-09-11
  Administered 2015-09-11: 10 mg via INTRAVENOUS

## 2015-09-11 MED ORDER — SUGAMMADEX SODIUM 500 MG/5ML IV SOLN
INTRAVENOUS | Status: AC
  Filled 2015-09-11: qty 5

## 2015-09-11 MED ORDER — FENTANYL CITRATE (PF) 100 MCG/2ML IJ SOLN
25.0000 ug | INTRAMUSCULAR | Status: DC | PRN
  Administered 2015-09-11 (×3): 50 ug via INTRAVENOUS

## 2015-09-11 MED ORDER — 0.9 % SODIUM CHLORIDE (POUR BTL) OPTIME
TOPICAL | Status: DC | PRN
  Administered 2015-09-11: 2000 mL

## 2015-09-11 MED ORDER — FENTANYL CITRATE (PF) 100 MCG/2ML IJ SOLN
INTRAMUSCULAR | Status: DC | PRN
  Administered 2015-09-11: 100 ug via INTRAVENOUS
  Administered 2015-09-11: 50 ug via INTRAVENOUS
  Administered 2015-09-11: 100 ug via INTRAVENOUS

## 2015-09-11 MED ORDER — SODIUM CHLORIDE 0.9 % IR SOLN
Status: AC
  Filled 2015-09-11: qty 1

## 2015-09-11 MED ORDER — PROMETHAZINE HCL 25 MG/ML IJ SOLN: 6.2500 mg | INTRAMUSCULAR | Status: DC | PRN

## 2015-09-11 MED ORDER — SCOPOLAMINE 1 MG/3DAYS TD PT72
MEDICATED_PATCH | TRANSDERMAL | Status: DC | PRN
  Administered 2015-09-11: 1 via TRANSDERMAL

## 2015-09-11 MED ORDER — ROCURONIUM BROMIDE 100 MG/10ML IV SOLN
INTRAVENOUS | Status: AC
  Filled 2015-09-11: qty 1

## 2015-09-11 MED ORDER — SUGAMMADEX SODIUM 200 MG/2ML IV SOLN
INTRAVENOUS | Status: AC
  Filled 2015-09-11: qty 2

## 2015-09-11 MED ORDER — LIDOCAINE HCL (CARDIAC) 20 MG/ML IV SOLN
INTRAVENOUS | Status: DC | PRN
  Administered 2015-09-11: 50 mg via INTRAVENOUS

## 2015-09-11 MED ORDER — ROCURONIUM BROMIDE 100 MG/10ML IV SOLN
INTRAVENOUS | Status: DC | PRN
  Administered 2015-09-11: 30 mg via INTRAVENOUS

## 2015-09-11 MED ORDER — PROPOFOL 10 MG/ML IV BOLUS
INTRAVENOUS | Status: DC | PRN
  Administered 2015-09-11: 110 mg via INTRAVENOUS

## 2015-09-11 SURGICAL SUPPLY — 53 items
APPLICATOR COTTON TIP 6IN STRL (MISCELLANEOUS) ×2 IMPLANT
BLADE EXTENDED COATED 6.5IN (ELECTRODE) ×2 IMPLANT
BLADE HEX COATED 2.75 (ELECTRODE) ×3 IMPLANT
BLADE SURG SZ10 CARB STEEL (BLADE) IMPLANT
CLIP TI LARGE 6 (CLIP) IMPLANT
COVER MAYO STAND STRL (DRAPES) ×3 IMPLANT
COVER SURGICAL LIGHT HANDLE (MISCELLANEOUS) ×3 IMPLANT
DRAPE LAPAROSCOPIC ABDOMINAL (DRAPES) ×1 IMPLANT
DRAPE SHEET LG 3/4 BI-LAMINATE (DRAPES) ×2 IMPLANT
DRAPE WARM FLUID 44X44 (DRAPE) ×3 IMPLANT
DRSG OPSITE POSTOP 4X10 (GAUZE/BANDAGES/DRESSINGS) ×2 IMPLANT
DRSG PAD ABDOMINAL 8X10 ST (GAUZE/BANDAGES/DRESSINGS) IMPLANT
ELECT REM PT RETURN 9FT ADLT (ELECTROSURGICAL) ×3
ELECTRODE REM PT RTRN 9FT ADLT (ELECTROSURGICAL) ×1 IMPLANT
ENSEAL DEVICE STD TIP 35CM (ENDOMECHANICALS) ×3 IMPLANT
GAUZE SPONGE 4X4 12PLY STRL (GAUZE/BANDAGES/DRESSINGS) ×1 IMPLANT
GLOVE BIOGEL PI IND STRL 6.5 (GLOVE) IMPLANT
GLOVE BIOGEL PI IND STRL 7.0 (GLOVE) ×1 IMPLANT
GLOVE BIOGEL PI IND STRL 7.5 (GLOVE) ×2 IMPLANT
GLOVE BIOGEL PI INDICATOR 6.5 (GLOVE) ×6
GLOVE BIOGEL PI INDICATOR 7.0 (GLOVE) ×2
GLOVE BIOGEL PI INDICATOR 7.5 (GLOVE) ×2
GLOVE ECLIPSE 7.5 STRL STRAW (GLOVE) ×6 IMPLANT
GLOVE SURG SIGNA 7.5 PF LTX (GLOVE) ×4 IMPLANT
GOWN STRL REUS W/TWL LRG LVL3 (GOWN DISPOSABLE) ×3 IMPLANT
GOWN STRL REUS W/TWL XL LVL3 (GOWN DISPOSABLE) ×6 IMPLANT
KIT BASIN OR (CUSTOM PROCEDURE TRAY) ×3 IMPLANT
LEGGING LITHOTOMY PAIR STRL (DRAPES) IMPLANT
LIGASURE IMPACT 36 18CM CVD LR (INSTRUMENTS) ×3 IMPLANT
NS IRRIG 1000ML POUR BTL (IV SOLUTION) ×6 IMPLANT
PACK GENERAL/GYN (CUSTOM PROCEDURE TRAY) ×3 IMPLANT
POUCH OSTOMY 1 PC DRNBL  2 1/2 (OSTOMY) IMPLANT
POUCH OSTOMY 2 1/2 (OSTOMY) ×3
RELOAD PROXIMATE 75MM BLUE (ENDOMECHANICALS) ×3 IMPLANT
RELOAD STAPLE 75 3.8 BLU REG (ENDOMECHANICALS) IMPLANT
SHEARS HARMONIC ACE PLUS 36CM (ENDOMECHANICALS) IMPLANT
SPONGE LAP 18X18 X RAY DECT (DISPOSABLE) IMPLANT
STAPLER PROXIMATE 75MM BLUE (STAPLE) ×2 IMPLANT
STAPLER VISISTAT 35W (STAPLE) ×3 IMPLANT
SUCTION POOLE TIP (SUCTIONS) ×3 IMPLANT
SUT NOVA NAB DX-16 0-1 5-0 T12 (SUTURE) IMPLANT
SUT NOVA T20/GS 25 (SUTURE) IMPLANT
SUT PDS AB 1 TP1 96 (SUTURE) ×6 IMPLANT
SUT SILK 2 0 (SUTURE) ×3
SUT SILK 2 0 SH CR/8 (SUTURE) ×3 IMPLANT
SUT SILK 2 0SH CR/8 30 (SUTURE) ×2 IMPLANT
SUT SILK 2-0 18XBRD TIE 12 (SUTURE) ×1 IMPLANT
SUT SILK 3 0 (SUTURE) ×6
SUT SILK 3-0 18XBRD TIE 12 (SUTURE) ×2 IMPLANT
SUT VIC AB 3-0 SH 18 (SUTURE) ×2 IMPLANT
TOWEL OR 17X26 10 PK STRL BLUE (TOWEL DISPOSABLE) ×6 IMPLANT
TRAY FOLEY W/METER SILVER 16FR (SET/KITS/TRAYS/PACK) ×3 IMPLANT
YANKAUER SUCT BULB TIP NO VENT (SUCTIONS) ×3 IMPLANT

## 2015-09-11 NOTE — Anesthesia Postprocedure Evaluation (Signed)
  Anesthesia Post-op Note  Patient: Kenneth [name]  Procedure(s) Performed: Procedure(s) (LRB): EXPLORATORY LAPAROTOMY (N/A) SIGMOID COLECTOMY AND COLOSTOMY (N/A)  Patient Location: PACU  Anesthesia Type: General  Level of Consciousness: awake and alert   Airway and Oxygen Therapy: Patient Spontanous Breathing  Post-op Pain: mild  Post-op Assessment: Post-op Vital signs reviewed, Patient's Cardiovascular Status Stable, Respiratory Function Stable, Patent Airway and No signs of Nausea or vomiting  Last Vitals:  Filed Vitals:   09/11/15 1720  BP: 189/86  Pulse: 94  Temp: 36.7 C  Resp: 20    Post-op Vital Signs: stable   Complications: No apparent anesthesia complications

## 2015-09-11 NOTE — Consult Note (Signed)
WOC requested for preoperative stoma site marking  Discussed surgical procedure and stoma creation with patient and wife.  Explained role of the [place] nurse team.  Provided the patient with educational booklet/DVD and provided samples of pouching options.  Answered patient and wife's questions.   Examined patient lying, sitting, and standing in order to place the marking in the patient's visual field, away from any creases or abdominal contour issues and within the rectus muscle.  Attempted to mark below the patient's belt line.    Marked for colostomy in the LLQ  2 cm to the left of the umbilicus and 3 cm below the umbilicus.   Covered mark with thin film transparent dressing to preserve mark until date of surgery  WOC team will follow up with patient after surgery for continue ostomy care and teaching.  Kamiryn Bezanson [place] RN,CWOCN [phone]

## 2015-09-11 NOTE — Anesthesia Procedure Notes (Signed)
Procedure Name: Intubation Date/Time: 09/11/2015 11:04 AM Performed by: Darris Staiger, Virgel Gess Pre-anesthesia Checklist: Patient identified, Emergency Drugs available, Suction available, Patient being monitored and Timeout performed Patient Re-evaluated:Patient Re-evaluated prior to inductionOxygen Delivery Method: Circle system utilized Preoxygenation: Pre-oxygenation with 100% oxygen Intubation Type: IV induction Ventilation: Mask ventilation without difficulty Laryngoscope Size: Mac and 4 Grade View: Grade II Tube type: Oral Tube size: 7.5 mm Number of attempts: 1 Airway Equipment and Method: Stylet Placement Confirmation: ETT inserted through vocal cords under direct vision,  positive ETCO2,  CO2 detector and breath sounds checked- equal and bilateral Secured at: 22 cm Tube secured with: Tape Dental Injury: Teeth and Oropharynx as per pre-operative assessment

## 2015-09-11 NOTE — Anesthesia Preprocedure Evaluation (Addendum)
Anesthesia Evaluation  Patient identified by MRN, date of birth, ID band Patient awake    Reviewed: Allergy & Precautions, NPO status , Patient's Chart, lab work & pertinent test results  History of Anesthesia Complications (+) PONV  Airway Mallampati: II  TM Distance: >3 FB Neck ROM: Full    Dental no notable dental hx.    Pulmonary Current Smoker,    Pulmonary exam normal breath sounds clear to auscultation       Cardiovascular hypertension, Pt. on medications + angina with exertion + CAD, + CABG (1993) and + DOE  Normal cardiovascular exam Rhythm:Regular Rate:Normal     Neuro/Psych negative neurological ROS  negative psych ROS   GI/Hepatic Neg liver ROS, hiatal hernia, GERD  ,  Endo/Other  diabetes, Type 2, Oral Hypoglycemic Agents  Renal/GU negative Renal ROS  negative genitourinary   Musculoskeletal negative musculoskeletal ROS (+)   Abdominal   Peds negative pediatric ROS (+)  Hematology negative hematology ROS (+)   Anesthesia Other Findings   Reproductive/Obstetrics negative OB ROS                            Anesthesia Physical Anesthesia Plan  ASA: III  Anesthesia Plan: General   Post-op Pain Management:    Induction: Intravenous  Airway Management Planned: Oral ETT  Additional Equipment:   Intra-op Plan:   Post-operative Plan: Extubation in OR  Informed Consent: I have reviewed the patients History and Physical, chart, labs and discussed the procedure including the risks, benefits and alternatives for the proposed anesthesia with the patient or authorized representative who has indicated his/her understanding and acceptance.   Dental advisory given  Plan Discussed with: CRNA  Anesthesia Plan Comments:         Anesthesia Quick Evaluation

## 2015-09-11 NOTE — Progress Notes (Signed)
[facility] PROGRESS NOTE  Kenneth [name] VZD:[id number] DOB: 1928/11/15 DOA: 09/09/2015 PCP: Irven Shelling, MD  Assessment/Plan: 1. Elevated troponin-  improving, repeat troponin is 0.07. Blood pressure is well controlled. Patient denies any chest pain ,was seen by cardiology and no further intervention recommended. Plan to go for surgery. likely from demand ischemia as patient was hypertensive with blood pressure as high as 184/87.  2. Hypertension- patient is currently nothing by mouth, will start the patient on IV metoprolol 2.5 mg every 6 hours. continue when necessary hydralazine  3.  Diabetes mellitus-patient is currently nothing by mouth, continue sliding scale insulin with NovoLog 4. Sigmoid colon mass with obstruction- patient was evaluated by GI, and  Found not a candidate for colonic  stent given the large size of the mass. Plan is for colectomy 5. DVT prophylaxis- Lovenox  Code Status: Full code Family Communication: *No family at bedside Disposition Plan: Patient to undergo colectomy , likely skilled nursing facility at the time of discharge   Consultants:  Surgery  Gastroenterology  Procedures:  None  Antibiotics:  None  HPI/Subjective: 81 year old male who came to the hospital with complaint of several months duration of progressively worsening left lower quadrant abdominal pain the pain has been getting worse and now 10/10 in intensity. In the ED imaging study showed colonic mass with question small bowel obstruction. General surgery has been consulted, and they recommended gastroenterology to see the patient for possible colonic  Stenting. Patient seen and examined, denies chest pain. Troponin has come down to 0.07. Patient was started on IV metoprolol 2.5 mg every 6 hours. Cardiology has seen the patient and cleared for surgery.   Objective: Filed Vitals:   09/11/15 1245  BP:   Pulse:   Temp: 98 F (36.7 C)  Resp:     Intake/Output  Summary (Last 24 hours) at 09/11/15 1321 Last data filed at 09/11/15 1244  Gross per 24 hour  Intake 3253.75 ml  Output   1125 ml  Net 2128.75 ml   Filed Weights   09/09/15 1507  Weight: 70.5 kg (155 lb 6.8 oz)    Exam:   General:  Appears in no acute distress  Cardiovascular: S1-S2 regular  Respiratory: Clear to auscultation bilaterally  Abdomen: Soft, mild tenderness in the left lower quadrant, no rigidity, no guarding ,no organomegaly  Musculoskeletal: No edema/cyanosis/clubbing noted in the lower extremities   Data Reviewed: Basic Metabolic Panel:  Recent Labs Lab 09/09/15 1020 09/10/15 0710 09/11/15 0526  NA 138 137 140  K 3.7 3.6 3.7  CL 105 105 107  CO2 27 24 24   GLUCOSE 144* 121* 116*  BUN 20 17 18   CREATININE 0.74 0.66 0.75  CALCIUM 9.1 8.5* 8.5*   Liver Function Tests:  Recent Labs Lab 09/09/15 1020 09/11/15 0526  AST 21 15  ALT 14* 10*  ALKPHOS 73 59  BILITOT 0.5 0.6  PROT 7.0 5.9*  ALBUMIN 3.5 2.9*    Recent Labs Lab 09/09/15 1020  LIPASE 19*   No results for input(s): AMMONIA in the last 168 hours. CBC:  Recent Labs Lab 09/09/15 1020 09/10/15 0710  WBC 9.5 8.2  HGB 13.1 11.7*  HCT 39.9 35.7*  MCV 94.3 95.2  PLT 341 339   Cardiac Enzymes:  Recent Labs Lab 09/10/15 0710 09/10/15 1247 09/10/15 1921 09/10/15 2358 09/11/15 0526  TROPONINI 0.14* 0.15* 0.12* 0.07* 0.07*    CBG:  Recent Labs Lab 09/11/15 0051 09/11/15 0556 09/11/15 0726 09/11/15 1033 09/11/15 1312  GLUCAP  92 121* 114* 112* 164*    Recent Results (from the past 240 hour(s))  Surgical pcr screen     Status: None   Collection Time: 09/11/15  3:55 AM  Result Value Ref Range Status   MRSA, PCR NEGATIVE NEGATIVE Final   Staphylococcus aureus NEGATIVE NEGATIVE Final    Comment:        The Xpert SA Assay (FDA approved for NASAL specimens in patients over 47 years of age), is one component of a comprehensive surveillance program.  Test  performance has been validated by [facility] for patients greater than or equal to 33 year old. It is not intended to diagnose infection nor to guide or monitor treatment.      Studies: No results found.  Scheduled Meds: . [MAR Hold] enoxaparin (LOVENOX) injection  40 mg Subcutaneous Q24H  . fentaNYL      . fentaNYL      . [MAR Hold] insulin aspart  0-9 Units Subcutaneous TID WC  . labetalol      . [MAR Hold] latanoprost  1 drop Both Eyes QHS  . [MAR Hold] levothyroxine  25 mcg Intravenous Daily  . [MAR Hold] metoprolol  2.5 mg Intravenous 4 times per day  . [MAR Hold] pantoprazole (PROTONIX) IV  40 mg Intravenous Q24H   Continuous Infusions: . dextrose 5 % and 0.9% NaCl 75 mL/hr at 09/10/15 2257  . lactated ringers 125 mL/hr at 09/11/15 1030    Principal Problem:   Nausea and vomiting Active Problems:   Coronary atherosclerosis of autologous vein bypass graft   Hyperlipidemia   Nephrolithiasis   Glaucoma   Preop cardiovascular exam   Small bowel obstruction (HCC)   Hypertensive urgency   Type II diabetes mellitus with ophthalmic manifestations, glaucoma   Colonic mass   Intestinal obstruction ([place])    Time spent: 25 min    [facility]  [facility] Pager [phone]*. If 7PM-7AM, please contact night-coverage at [email or web address], password [facility] 09/11/2015, 1:21 PM  LOS: 2 days

## 2015-09-11 NOTE — Transfer of Care (Signed)
Immediate Anesthesia Transfer of Care Note  Patient: Kenneth [name]  Procedure(s) Performed: Procedure(s): EXPLORATORY LAPAROTOMY (N/A) SIGMOID COLECTOMY AND COLOSTOMY (N/A)  Patient Location: PACU  Anesthesia Type:General  Level of Consciousness:  sedated, patient cooperative and responds to stimulation  Airway & Oxygen Therapy:Patient Spontanous Breathing and Patient connected to face mask oxgen  Post-op Assessment:  Report given to PACU RN and Post -op Vital signs reviewed and stable  Post vital signs:  Reviewed and stable  Last Vitals:  Filed Vitals:   09/11/15 0550  BP: 158/61  Pulse: 59  Temp: 36.7 C  Resp: 18    Complications: No apparent anesthesia complications

## 2015-09-11 NOTE — Progress Notes (Signed)
PACU note----pt continues to c/o pain and pt's BP elevated; Dr. Marcell Barlow notified, orders rec'd and meds given

## 2015-09-11 NOTE — Op Note (Signed)
Preoperative Diagnosis: Colonic mass [K63.89] Sigmoid colon obstruction  Postoprative Diagnosis: Same  Procedure: Procedure(s): EXPLORATORY LAPAROTOMY SIGMOID COLECTOMY AND COLOSTOMY   Surgeon: Excell Seltzer T   Assistants: Alphonsa Overall  Anesthesia:  General endotracheal anesthesia  Indications: Patient is an 81 year old male who presents with almost 1 year of increasingly difficult bowel movements with left lower quadrant cramping and pain. This progressed to more severe left lower quadrant pain and obstipation and he presented to the emergency department. CT scan was obtained showing a large mass in the sigmoid colon possibly neoplasm or inflammatory with obstruction. Preoperatively he was evaluated by GI that felt stenting was not possible due to the length and size of the obstruction. We have therefore recommended proceeding with laparotomy and sigmoid colectomy with Hartmann colostomy. I discussed the procedure and its indications and nature as well as risks including anesthetic complications, cardiorespiratory complications, bleeding, infection and wound healing problems with the patient and his family. They understand and agree to proceed.  Procedure Detail: Patient was brought to the operating room, placed in the supine position on the operating table, and general endotracheal anesthesia induced. Foley catheter was placed. NG tube was placed. He received broad-spectrum preoperative IV antibiotics. PAS were in place. The abdomen was widely sterilely prepped and draped. Patient timeout was performed and correct procedure verified. A midline incision extending from just above the umbilicus down to the pubis was used and dissection carried down through the simultaneous tissue and midline fascia and the peritoneum was entered under direct vision. A thorough exploration was performed. There was a large firm mass in the mid to proximal sigmoid colon with completely decompressed and normal  distal sigmoid colon and dilated very congested but viable proximal colon. Marked diverticulosis of the left colon was noted. There were a few mildly enlarged lymph nodes in the mesentery of the sigmoid colon. This all seemed most consistent with neoplasm although I could not rule out an inflammatory mass. The liver was normal. No retroperitoneal adenopathy. There were numerous jejunal diverticulum but otherwise normal small bowel. We proceeded with resection is planned. There were a number of chronic appearing lateral adhesions to the sigmoid colon were taken down with cautery. The peritoneal reflection was identified and incised in the left and sigmoid colon dissected up out of the retroperitoneum. The ureter was identified early in the dissection throughout its course and was carefully protected throughout the remainder of the procedure. An area of proximal resection approximately 8 or 9 cm proximal to the obstruction was chosen and the colon was divided at this point with the GIA 75 mm stapler. The mesentery of the involved segment was then sequentially divided using the LigaSure and also limping and tying the proximal end of larger vessels. The dissection was taken down into the retroperitoneum to take as much mesentery as possible carefully protecting the ureter. A few somewhat enlarged lymph nodes were noted and were taken with the specimen. The dissection progressed distally until we were beyond the mass down to normal-appearing sigmoid which was cleared of mesentery and pericolic fat and divided with the GIA 75 mm stapler and the specimen removed. The abdomen was then thoroughly irrigated and hemostasis assured. The left colon was mobilized more extensively dividing lateral peritoneal attachments to provide mobility for the ostomy. The patient had been marked for ostomy and I went just about a centimeter above this as there was a piece of mesh previously placed 4 transperitoneal repair of left inguinal  hernia and I wanted to  go a centimeter or so above this. A skin button and subcutaneous fat was excised and the anterior fascia incised in a cruciate fashion. The rectus muscle was split bluntly and the peritoneum divided. This was dilated to 2 fingers. The proximal sigmoid was brought out as an end colostomy without any undue tension. The abdomen was again inspected for hemostasis. Gloves and isthmus were changed. The midline fascia was closed with running looped #1 PDS begun from either of the incision and tied centrally. The septations tissue was irrigated with anabolic solution and the skin closed with staples. With the wound isolated I matured the colostomy excising the staple line and using 3-0 Vicryl everting the stoma and securing to the skin edge. Sponge needle and instrument counts were correct. Ostomy device and dry sterile dressing were applied.    Findings: As above  Estimated Blood Loss:  less than 50 mL         Drains: none  Blood Given: none          Specimens: Sigmoid colon        Complications:  * No complications entered in OR log *         Disposition: PACU - hemodynamically stable.         Condition: stable

## 2015-09-11 NOTE — Progress Notes (Signed)
Patient ID: Kenneth [name], male   DOB: 25-May-1928, 81 y.o.   MRN: [id number] Day of Surgery  Subjective: Pt with no new complaints today.  Ready for surgery.    Objective: Vital signs in last 24 hours: Temp:  [98.1 F (36.7 C)-99.1 F (37.3 C)] 98.1 F (36.7 C) (10/06 0550) Pulse Rate:  [59-89] 59 (10/06 0550) Resp:  [18] 18 (10/06 0550) BP: (144-158)/(56-70) 158/61 mmHg (10/06 0550) SpO2:  [95 %-96 %] 96 % (10/06 0550) Last BM Date: 09/07/15  Intake/Output from previous day: 10/05 0701 - 10/06 0700 In: 1953.8 [I.V.:1953.8] Out: 675 [Urine:675] Intake/Output this shift:    PE: Abd: soft, still tender in LLQ, but otherwise ok, +BS, minimal distention Heart: regular  Lab Results:   Recent Labs  09/09/15 1020 09/10/15 0710  WBC 9.5 8.2  HGB 13.1 11.7*  HCT 39.9 35.7*  PLT 341 339   BMET  Recent Labs  09/10/15 0710 09/11/15 0526  NA 137 140  K 3.6 3.7  CL 105 107  CO2 24 24  GLUCOSE 121* 116*  BUN 17 18  CREATININE 0.66 0.75  CALCIUM 8.5* 8.5*   PT/INR  Recent Labs  09/09/15 1615  LABPROT 12.9  INR 0.95   CMP     Component Value Date/Time   NA 140 09/11/2015 0526   K 3.7 09/11/2015 0526   CL 107 09/11/2015 0526   CO2 24 09/11/2015 0526   GLUCOSE 116* 09/11/2015 0526   BUN 18 09/11/2015 0526   CREATININE 0.75 09/11/2015 0526   CALCIUM 8.5* 09/11/2015 0526   PROT 5.9* 09/11/2015 0526   ALBUMIN 2.9* 09/11/2015 0526   AST 15 09/11/2015 0526   ALT 10* 09/11/2015 0526   ALKPHOS 59 09/11/2015 0526   BILITOT 0.6 09/11/2015 0526   GFRNONAA >60 09/11/2015 0526   GFRAA >60 09/11/2015 0526   Lipase     Component Value Date/Time   LIPASE 19* 09/09/2015 1020       Studies/Results: Ct Abdomen Pelvis W Contrast  09/09/2015   CLINICAL DATA:  Left lower quadrant pain for 2 weeks. Decreased bowel movements.  EXAM: CT ABDOMEN AND PELVIS WITH CONTRAST  TECHNIQUE: Multidetector CT imaging of the abdomen and pelvis was performed using the standard  protocol following bolus administration of intravenous contrast.  CONTRAST:  140mL OMNIPAQUE IOHEXOL 300 MG/ML  SOLN  COMPARISON:  08/22/2014  FINDINGS: Stable small calcified granuloma at the left lung base. No evidence pleural effusions.  No evidence for free intraperitoneal air. Again noted is a large hiatal hernia containing most of the stomach. There is no evidence for gastric obstruction or inflammatory changes. Please note that the entire stomach is not visualized. Again noted are two low-density structures in right hepatic lobe, largest measuring 2.4 cm. Findings are most compatible with hepatic cysts. Otherwise, normal appearance the liver and the portal venous system is patent. Normal appearance of the gallbladder. There is a normal appearance of spleen. A small amount of fluid in the left upper quadrant lateral to the spleen on sequence 2, image 16. Multiple calcifications near the pancreatic head suggestive for old pancreas inflammation. Stable fullness of the right adrenal gland is unchanged. Normal appearance of the left adrenal gland. There is an 8 mm nonobstructive stone in the right kidney lower pole. Normal appearance of the left kidney.  Atherosclerotic calcifications involving the aorta and iliac arteries without aneurysm.  No gross abnormality to the urinary bladder or prostate. Small-moderate amount of fluid along the right side of  the pelvis on sequence 2, image 65. Again noted is extensive wall thickening involving the sigmoid colon with diverticula. This abnormal wall thickening in the sigmoid colon involves a 10 cm segment. The descending colon is moderately distended with stool. There is gas and stool throughout the transverse colon and right colon. Mild inflammatory changes around the descending colon, best seen on sequence 2, image 32. In addition, there are prominent lymph nodes in the sigmoid mesocolon and along the drainage of the sigmoid colon, best seen on sequence 2, image 50.  No  significant dilatation of small bowel loops. Previously, there was a small hernia in the left lower abdomen which has either been repaired or less conspicuous.  No acute bone abnormality. Disc space and endplates changes X5-Q0 and L5-S1.  IMPRESSION: Abnormal sigmoid colon with diffuse wall thickening and concern for a colonic mass. The colon proximal to this abnormal sigmoid colon is distended with a large amount of stool and pericolonic inflammatory changes. There is a small amount of fluid in left upper quadrant and small to moderate amount of fluid in the pelvis. In addition, there are multiple lymph nodes throughout the sigmoid mesocolon region. Findings are highly concerning for a neoplastic process in the sigmoid colon causing at least partial obstruction and inflammatory changes in the descending colon.  Large hiatal hernia containing most of the stomach.  Nonobstructive right kidney stone.  These results were called by telephone at the time of interpretation on 09/09/2015 at 12:42 pm to Dr. Carmin Muskrat , who verbally acknowledged these results.   Electronically Signed   By: Markus Daft M.D.   On: 09/09/2015 12:46    Anti-infectives: Anti-infectives    Start     Dose/Rate Route Frequency Ordered Stop   09/11/15 0600  clindamycin (CLEOCIN) IVPB 900 mg     900 mg 100 mL/hr over 30 Minutes Intravenous On call to O.R. 09/10/15 1623 09/12/15 0559   09/11/15 0600  gentamicin (GARAMYCIN) IVPB 100 mg  Status:  Discontinued     100 mg 200 mL/hr over 30 Minutes Intravenous On call to O.R. 09/10/15 1623 09/10/15 1631   09/11/15 0600  gentamicin (GARAMYCIN) 350 mg in dextrose 5 % 100 mL IVPB     350 mg 217.5 mL/hr over 30 Minutes Intravenous On call to O.R. 09/10/15 1630 09/12/15 0559       Assessment/Plan  1. Colonic obstruction, secondary to neoplasm or diverticular stricture -to OR today for Hartman's procedure -d/w patient and his wife who are all in agreement -patient has been marked for  his colostomy -seen by cards and cleared given elevated troponins on admit.   LOS: 2 days    Cordie Beazley E 09/11/2015, 8:43 AM Pager: [phone]

## 2015-09-11 NOTE — Consult Note (Signed)
CARDIOLOGY CONSULT NOTE       Patient ID: DEVERE BREM MRN: [id number] DOB/AGE: 09-03-1928 81 y.o.  Admit date: 09/09/2015 Referring Physician:  Darrick Meigs Primary Physician: Irven Shelling, MD Primary Cardiologist:  Daneen Schick Reason for Consultation: Preop  Principal Problem:   Nausea and vomiting Active Problems:   Hyperlipidemia   Nephrolithiasis   Glaucoma   Small bowel obstruction ([place])   Hypertensive urgency   Type II diabetes mellitus with ophthalmic manifestations, glaucoma   Colonic mass   Intestinal obstruction ([facility])   HPI:  81 y.o. with LLQ pain for 5-6 months.  Early satiety and change in bowel habits.  Acute worsening of symptoms no BM 3 days and now with colonic obstruction secondary to neoplasm Vs diverticular stricture.  Needs colon resection planned for this morning with Dr Excell Seltzer.  He denies SSCP.  Says he has not taken nitro in over 2 years. Is active walking until recently Distant CABG 1993 with LIMA to LAD, SVG OM and SVG PDA.  Troponin .07-.15  this admission  Hemodynamics stable  Cleared by Dr Tamala Julian 10/2014 for hernia surgery by Dr [name].  Had Surgery with no preop testing and no complications    ROS All other systems reviewed and negative except as noted above  Past Medical History  Diagnosis Date  . Diabetes ([place])   . HTN (hypertension)   . Coronary atherosclerosis of unspecified type of vessel, native or graft     CABG 1993, LIMA to LAD, SVG to obtuse marginal one, SVG to posterior descending, stable angina  . Hyperlipidemia `  . GERD (gastroesophageal reflux disease)   . Chronic rhinitis   . Glaucoma   . Melanoma of skin, site unspecified   . Hernia     left side  . Complication of anesthesia   . PONV (postoperative nausea and vomiting)   . Anemia     on iron  . [facility] spotted fever 2-3 yrs ago  . CAD (coronary artery disease)   . Diverticulosis of colon   . Anginal pain ([place])     last 3 days ago goes away if stop and  rest  . Dysrhythmia     occ "skips a beat"  . History of kidney stones   . H/O hiatal hernia   . Arthritis     Family History  Problem Relation Age of Onset  . CAD Father   . Hyperlipidemia Father   . Cancer Mother     Social History   Social History  . Marital Status: Married    Spouse Name: N/A  . Number of Children: N/A  . Years of Education: N/A   Occupational History  . Not on file.   Social History Main Topics  . Smoking status: Light Tobacco Smoker    Types: Cigars  . Smokeless tobacco: Never Used  . Alcohol Use: No  . Drug Use: No     Comment: occ cigars  . Sexual Activity: Not on file   Other Topics Concern  . Not on file   Social History Narrative    Past Surgical History  Procedure Laterality Date  . Right leg fracture age 46    . Coronary artery bypass graft  1990's    x 3  . Tonsillectomy  age 66 or 22  . Colonoscopy with propofol N/A 11/27/2013    Procedure: COLONOSCOPY WITH PROPOFOL;  Surgeon: Garlan Fair, MD;  Location: [facility];  Service: Endoscopy;  Laterality: N/A;  . Inguinal  hernia repair Left 06/28/2014    Procedure: Left  INGUINAL HERNIA REPAIR ;  Surgeon: Merrie Roof, MD;  Location: [facility];  Service: General;  Laterality: Left;  . Insertion of mesh Left 06/28/2014    Procedure: INSERTION OF MESH;  Surgeon: Merrie Roof, MD;  Location: [facility];  Service: General;  Laterality: Left;  . Hernia repair  [phone]    lft ing hernia  . Insertion of mesh N/A 10/11/2014  . Laparoscopy  10/11/2014  . Ventral hernia repair  10/11/2014    Procedure: HERNIA REPAIR VENTRAL ADULT;  Surgeon: Autumn Messing III, MD;  Location: [place];  Service: General;;     . clindamycin (CLEOCIN) IV  900 mg Intravenous On Call to OR  . enoxaparin (LOVENOX) injection  40 mg Subcutaneous Q24H  . gentamicin  350 mg Intravenous On Call to OR  . insulin aspart  0-9 Units Subcutaneous TID WC  . latanoprost  1 drop Both Eyes QHS  . levothyroxine  25 mcg Intravenous  Daily  . metoprolol  2.5 mg Intravenous 4 times per day  . pantoprazole (PROTONIX) IV  40 mg Intravenous Q24H   . dextrose 5 % and 0.9% NaCl 75 mL/hr at 09/10/15 2257    Physical Exam: Blood pressure 158/61, pulse 59, temperature 98.1 F (36.7 C), temperature source Oral, resp. rate 18, height 5\' 11"  (1.803 m), weight 70.5 kg (155 lb 6.8 oz), SpO2 96 %.    Affect appropriate Healthy:  appears stated age 81: normal Neck supple with no adenopathy JVP normal no bruits no thyromegaly Lungs clear with no wheezing and good diaphragmatic motion Heart:  S1/S2 SEM / AR  murmur, no rub, gallop or click PMI normal Abdomen: previous left hernia surgery LLQ pain no rebound no bruit.  No HSM or HJR Distal pulses intact with no bruits No edema Neuro non-focal Skin warm and dry No muscular weakness   Labs:   Lab Results  Component Value Date   WBC 8.2 09/10/2015   HGB 11.7* 09/10/2015   HCT 35.7* 09/10/2015   MCV 95.2 09/10/2015   PLT 339 09/10/2015    Recent Labs Lab 09/11/15 0526  NA 140  K 3.7  CL 107  CO2 24  BUN 18  CREATININE 0.75  CALCIUM 8.5*  PROT 5.9*  BILITOT 0.6  ALKPHOS 59  ALT 10*  AST 15  GLUCOSE 116*   Lab Results  Component Value Date   TROPONINI 0.07* 09/11/2015   No results found for: CHOL No results found for: HDL No results found for: LDLCALC No results found for: TRIG No results found for: CHOLHDL No results found for: LDLDIRECT    Radiology: Ct Abdomen Pelvis W Contrast  09/09/2015   CLINICAL DATA:  Left lower quadrant pain for 2 weeks. Decreased bowel movements.  EXAM: CT ABDOMEN AND PELVIS WITH CONTRAST  TECHNIQUE: Multidetector CT imaging of the abdomen and pelvis was performed using the standard protocol following bolus administration of intravenous contrast.  CONTRAST:  151mL OMNIPAQUE IOHEXOL 300 MG/ML  SOLN  COMPARISON:  08/22/2014  FINDINGS: Stable small calcified granuloma at the left lung base. No evidence pleural effusions.  No  evidence for free intraperitoneal air. Again noted is a large hiatal hernia containing most of the stomach. There is no evidence for gastric obstruction or inflammatory changes. Please note that the entire stomach is not visualized. Again noted are two low-density structures in right hepatic lobe, largest measuring 2.4 cm. Findings are most compatible  with hepatic cysts. Otherwise, normal appearance the liver and the portal venous system is patent. Normal appearance of the gallbladder. There is a normal appearance of spleen. A small amount of fluid in the left upper quadrant lateral to the spleen on sequence 2, image 16. Multiple calcifications near the pancreatic head suggestive for old pancreas inflammation. Stable fullness of the right adrenal gland is unchanged. Normal appearance of the left adrenal gland. There is an 8 mm nonobstructive stone in the right kidney lower pole. Normal appearance of the left kidney.  Atherosclerotic calcifications involving the aorta and iliac arteries without aneurysm.  No gross abnormality to the urinary bladder or prostate. Small-moderate amount of fluid along the right side of the pelvis on sequence 2, image 65. Again noted is extensive wall thickening involving the sigmoid colon with diverticula. This abnormal wall thickening in the sigmoid colon involves a 10 cm segment. The descending colon is moderately distended with stool. There is gas and stool throughout the transverse colon and right colon. Mild inflammatory changes around the descending colon, best seen on sequence 2, image 32. In addition, there are prominent lymph nodes in the sigmoid mesocolon and along the drainage of the sigmoid colon, best seen on sequence 2, image 50.  No significant dilatation of small bowel loops. Previously, there was a small hernia in the left lower abdomen which has either been repaired or less conspicuous.  No acute bone abnormality. Disc space and endplates changes L9-F7 and L5-S1.   IMPRESSION: Abnormal sigmoid colon with diffuse wall thickening and concern for a colonic mass. The colon proximal to this abnormal sigmoid colon is distended with a large amount of stool and pericolonic inflammatory changes. There is a small amount of fluid in left upper quadrant and small to moderate amount of fluid in the pelvis. In addition, there are multiple lymph nodes throughout the sigmoid mesocolon region. Findings are highly concerning for a neoplastic process in the sigmoid colon causing at least partial obstruction and inflammatory changes in the descending colon.  Large hiatal hernia containing most of the stomach.  Nonobstructive right kidney stone.  These results were called by telephone at the time of interpretation on 09/09/2015 at 12:42 pm to Dr. Carmin Muskrat , who verbally acknowledged these results.   Electronically Signed   By: Markus Daft M.D.   On: 09/09/2015 12:46    EKG:  SR minor lateral T wave changes V56  No acute changes    ASSESSMENT AND PLAN:  Preop:  Distant CABG.  No angina, CHF or arrhythmia.  General anesthesia for hernia surgery 90/2409 with no complications.  Need for emergent surgery due to pain  And obstruction.  Proceed this am.  Will monitor on telemetry  No ECG changes despite mild elevation in troponin  Continue beta blocker   GI:  On protonix gent/clincamycin for GI coverage  Ostomy nurse in room seeing patient  Thyroid:  On replacement check TSH  Will follow post op   Signed: Jenkins Rouge 09/11/2015, 8:12 AM

## 2015-09-12 LAB — GLUCOSE, CAPILLARY
GLUCOSE-CAPILLARY: 183 mg/dL — AB (ref 65–99)
GLUCOSE-CAPILLARY: 199 mg/dL — AB (ref 65–99)
GLUCOSE-CAPILLARY: 154 mg/dL — AB (ref 65–99)
GLUCOSE-CAPILLARY: 136 mg/dL — AB (ref 65–99)
GLUCOSE-CAPILLARY: 135 mg/dL — AB (ref 65–99)

## 2015-09-12 LAB — BASIC METABOLIC PANEL
ANION GAP: 6 (ref 5–15)
BUN: 11 mg/dL (ref 6–20)
CALCIUM: 8.4 mg/dL — AB (ref 8.9–10.3)
CO2: 25 mmol/L (ref 22–32)
Chloride: 104 mmol/L (ref 101–111)
Creatinine, Ser: 0.64 mg/dL (ref 0.61–1.24)
Glucose, Bld: 207 mg/dL — ABNORMAL HIGH (ref 65–99)
Potassium: 3.7 mmol/L (ref 3.5–5.1)
Sodium: 135 mmol/L (ref 135–145)

## 2015-09-12 LAB — CBC
HEMATOCRIT: 39.8 % (ref 39.0–52.0)
Hemoglobin: 13 g/dL (ref 13.0–17.0)
MCH: 30.7 pg (ref 26.0–34.0)
MCHC: 32.7 g/dL (ref 30.0–36.0)
MCV: 94.1 fL (ref 78.0–100.0)
PLATELETS: 345 10*3/uL (ref 150–400)
RBC: 4.23 MIL/uL (ref 4.22–5.81)
RDW: 13.4 % (ref 11.5–15.5)
WBC: 15.5 10*3/uL — AB (ref 4.0–10.5)

## 2015-09-12 NOTE — Progress Notes (Signed)
[facility] PROGRESS NOTE  Kenneth [name] OIZ:[id number] DOB: [birth year] DOA: 09/09/2015 PCP: Irven Shelling, MD  Assessment/Plan: 1. Elevated troponin-  improving, repeat troponin is 0.07. Blood pressure is well controlled. Patient denies any chest pain ,was seen by cardiology and no further intervention recommended.  2. Hypertension- patient is currently nothing by mouth, will start the patient on IV metoprolol 2.5 mg every 6 hours. continue when necessary hydralazine  3.  Diabetes mellitus-patient is currently nothing by mouth, continue sliding scale insulin with NovoLog 4. S/p sigmoid colectomy, colostomy for obstructing colon mass- patient was evaluated by GI, and  Found not a candidate for colonic stent given the large size of the mass. Underwent  Sigmoid colectomy 5. DVT prophylaxis- Lovenox  Code Status: Full code Family Communication: *No family at bedside Disposition Plan: Patient to undergo colectomy , likely skilled nursing facility at the time of discharge   Consultants:  Surgery  Gastroenterology  Procedures:  None  Antibiotics:  None  HPI/Subjective: 81 year old male who came to the hospital with complaint of several months duration of progressively worsening left lower quadrant abdominal pain the pain has been getting worse and now 10/10 in intensity. In the ED imaging study showed colonic mass with question small bowel obstruction. General surgery has been consulted, and they recommended gastroenterology to see the patient for possible colonic  Stenting. Patient seen and examined, s/p sigmoid colectomy, colostomy for obstructing colon mass.   Objective: Filed Vitals:   09/12/15 0608  BP: 156/71  Pulse:   Temp:   Resp:     Intake/Output Summary (Last 24 hours) at 09/12/15 1210 Last data filed at 09/12/15 1100  Gross per 24 hour  Intake   2165 ml  Output   1370 ml  Net    795 ml   Filed Weights   09/09/15 1507  Weight: 70.5 kg (155 lb  6.8 oz)    Exam:   General:  Appears in no acute distress, NG tube in place  Cardiovascular: S1-S2 regular  Respiratory: Clear to auscultation bilaterally  Abdomen: Soft, mild tenderness in the left lower quadrant, no rigidity, no guarding ,no organomegaly  Musculoskeletal: No edema/cyanosis/clubbing noted in the lower extremities   Data Reviewed: Basic Metabolic Panel:  Recent Labs Lab 09/09/15 1020 09/10/15 0710 09/11/15 0526 09/12/15 0548  NA 138 137 140 135  K 3.7 3.6 3.7 3.7  CL 105 105 107 104  CO2 27 24 24 25   GLUCOSE 144* 121* 116* 207*  BUN 20 17 18 11   CREATININE 0.74 0.66 0.75 0.64  CALCIUM 9.1 8.5* 8.5* 8.4*   Liver Function Tests:  Recent Labs Lab 09/09/15 1020 09/11/15 0526  AST 21 15  ALT 14* 10*  ALKPHOS 73 59  BILITOT 0.5 0.6  PROT 7.0 5.9*  ALBUMIN 3.5 2.9*    Recent Labs Lab 09/09/15 1020  LIPASE 19*   No results for input(s): AMMONIA in the last 168 hours. CBC:  Recent Labs Lab 09/09/15 1020 09/10/15 0710 09/12/15 0548  WBC 9.5 8.2 15.5*  HGB 13.1 11.7* 13.0  HCT 39.9 35.7* 39.8  MCV 94.3 95.2 94.1  PLT 341 339 345   Cardiac Enzymes:  Recent Labs Lab 09/10/15 0710 09/10/15 1247 09/10/15 1921 09/10/15 2358 09/11/15 0526  TROPONINI 0.14* 0.15* 0.12* 0.07* 0.07*    CBG:  Recent Labs Lab 09/11/15 1631 09/11/15 2001 09/11/15 2332 09/12/15 0359 09/12/15 0735  GLUCAP 184* 183* 214* 183* 199*    Recent Results (from the past 240 hour(s))  Surgical pcr screen     Status: None   Collection Time: 09/11/15  3:55 AM  Result Value Ref Range Status   MRSA, PCR NEGATIVE NEGATIVE Final   Staphylococcus aureus NEGATIVE NEGATIVE Final    Comment:        The Xpert SA Assay (FDA approved for NASAL specimens in patients over 37 years of age), is one component of a comprehensive surveillance program.  Test performance has been validated by [facility] for patients greater than or equal to 63 year old. It is not  intended to diagnose infection nor to guide or monitor treatment.      Studies: No results found.  Scheduled Meds: . enoxaparin (LOVENOX) injection  40 mg Subcutaneous Q24H  . insulin aspart  0-9 Units Subcutaneous TID WC  . latanoprost  1 drop Both Eyes QHS  . levothyroxine  25 mcg Intravenous Daily  . metoprolol  2.5 mg Intravenous 4 times per day  . pantoprazole (PROTONIX) IV  40 mg Intravenous Q24H   Continuous Infusions: . dextrose 5 % and 0.9% NaCl 100 mL/hr (09/12/15 1018)    Principal Problem:   Nausea and vomiting Active Problems:   Coronary atherosclerosis of autologous vein bypass graft   Hyperlipidemia   Nephrolithiasis   Glaucoma   Preop cardiovascular exam   Small bowel obstruction (HCC)   Hypertensive urgency   Type II diabetes mellitus with ophthalmic manifestations, glaucoma   Colonic mass   Intestinal obstruction ([place])    Time spent: 25 min    [facility]  [facility] Pager [phone]*. If 7PM-7AM, please contact night-coverage at [email or web address], password [facility] 09/12/2015, 12:10 PM  LOS: 3 days

## 2015-09-12 NOTE — Consult Note (Signed)
WOC ostomy consult note Stoma type/location: LLQ Colostomy Stomal assessment/size: 1 3/4" round pink moist and patent. Os is pointed upward at 10 o'clock.  Peristomal assessment: Intact.  Creasing at 10 and 4 o'clock, will add barrier ring and 1 piece flat pouch.  Midline abdominal dressing (Honeycomb) dressing in place.  Had to trim a small margin of this for pouch removal.  Treatment options for stomal/peristomal skin: Barrier ring to improve seal and will trim a small amount of the pouch adhesive border to avoid the midline surgical site.  Output Only blood tinged liquid in the pouch today.  Ostomy pouching: 1pc. Flat with barrier ring.  2 additional pouches and rings left in the basin in the room.  Education provided: Patient is in a lot of pain today, Grimacing with pouch removal.  Is medicated for pain.  Wife/family not present at bedside.  Patient observes pouch change and I demonstrate roll closure,  but does not participate.   Enrolled patient in [employer] program: No.  Will await final product selection.  [place] team will continue to follow patient for ostomy teaching with patient and family.  Domenic Moras RN BSN [place] Pager [phone]

## 2015-09-12 NOTE — Care Management Important Message (Signed)
Important Message  Patient Details  Name: Kenneth [name] MRN: [id number] Date of Birth: 06-Feb-1928   Medicare Important Message Given:  Yes-second notification given    Camillo Flaming 09/12/2015, 11:34 AMImportant Message  Patient Details  Name: Kenneth [name] MRN: [id number] Date of Birth: Apr 16, 1928   Medicare Important Message Given:  Yes-second notification given    Camillo Flaming 09/12/2015, 11:34 AM

## 2015-09-12 NOTE — Progress Notes (Signed)
Patient coughed up a small amount of bloody mucous. On call was notified and new orders were given to hold the lovenox.

## 2015-09-12 NOTE — Progress Notes (Signed)
Patient ID: Kenneth [name], male   DOB: 1928/09/29, 81 y.o.   MRN: [id number]    Subjective:  Denies SSCP, palpitations or Dyspnea Post op colectomy Day #1   Objective:  Filed Vitals:   09/11/15 2330 09/12/15 0358 09/12/15 0554 09/12/15 0608  BP: 160/72 155/81 184/83 156/71  Pulse: 106 102    Temp: 97.9 F (36.6 C) 98.4 F (36.9 C)    TempSrc: Oral     Resp: 16 18    Height:      Weight:      SpO2: 98% 98%      Intake/Output from previous day:  Intake/Output Summary (Last 24 hours) at 09/12/15 0920 Last data filed at 09/12/15 0815  Gross per 24 hour  Intake   3165 ml  Output   1470 ml  Net   1695 ml    Physical Exam: Affect appropriate Healthy:  appears stated age HEENT: normal Neck supple with no adenopathy JVP normal no bruits no thyromegaly Lungs clear with no wheezing and good diaphragmatic motion Heart:  S1/S2 SEM  murmur, no rub, gallop or click PMI normal Abdomen: NG tube ostomy and foley catheter  no bruit.  No HSM or HJR Distal pulses intact with no bruits No edema Neuro non-focal Skin warm and dry No muscular weakness   Lab Results: Basic Metabolic Panel:  Recent Labs  09/11/15 0526 09/12/15 0548  NA 140 135  K 3.7 3.7  CL 107 104  CO2 24 25  GLUCOSE 116* 207*  BUN 18 11  CREATININE 0.75 0.64  CALCIUM 8.5* 8.4*   Liver Function Tests:  Recent Labs  09/09/15 1020 09/11/15 0526  AST 21 15  ALT 14* 10*  ALKPHOS 73 59  BILITOT 0.5 0.6  PROT 7.0 5.9*  ALBUMIN 3.5 2.9*    Recent Labs  09/09/15 1020  LIPASE 19*   CBC:  Recent Labs  09/10/15 0710 09/12/15 0548  WBC 8.2 15.5*  HGB 11.7* 13.0  HCT 35.7* 39.8  MCV 95.2 94.1  PLT 339 345   Cardiac Enzymes:  Recent Labs  09/10/15 1921 09/10/15 2358 09/11/15 0526  TROPONINI 0.12* 0.07* 0.07*    Imaging: No results found.  Cardiac Studies:  ECG:  sR rate 64 minor lateral T wave changes V56   Telemetry:  NSR no arrhythmia   Echo:   Medications:   .  enoxaparin (LOVENOX) injection  40 mg Subcutaneous Q24H  . insulin aspart  0-9 Units Subcutaneous TID WC  . latanoprost  1 drop Both Eyes QHS  . levothyroxine  25 mcg Intravenous Daily  . metoprolol  2.5 mg Intravenous 4 times per day  . pantoprazole (PROTONIX) IV  40 mg Intravenous Q24H     . dextrose 5 % and 0.9% NaCl 100 mL/hr at 09/12/15 0108    Assessment/Plan:  CAD:  Distant CABG. No angina, CHF or arrhythmia. General anesthesia for hernia surgery 48/2500 with no complications.  Doing well post op now iv beta blocker as needed for HR over 80 bpm  Troponin .07 not significant  Routine Post op care for ostomy  Advance diet per surgery   Jenkins Rouge 09/12/2015, 9:20 AM

## 2015-09-12 NOTE — Progress Notes (Signed)
Patient ID: Kenneth [name], male   DOB: [birth year], 81 y.o.   MRN: [id number] 1 Day Post-Op  Subjective: Pt c/o some pain this morning, but otherwise ok.    Objective: Vital signs in last 24 hours: Temp:  [97.5 F (36.4 C)-98.4 F (36.9 C)] 98.4 F (36.9 C) (10/07 0358) Pulse Rate:  [60-106] 102 (10/07 0358) Resp:  [10-20] 18 (10/07 0358) BP: (155-213)/(59-91) 156/71 mmHg (10/07 0608) SpO2:  [93 %-99 %] 98 % (10/07 0358) Last BM Date: 09/07/15  Intake/Output from previous day: 10/06 0701 - 10/07 0700 In: 3165 [I.V.:3165] Out: 1620 [Urine:1250; Emesis/NG output:270; Blood:100] Intake/Output this shift:    PE: Abd: soft, appropriately tender, NGT with about 450cc of bilious output, midline incision with staples in place, some old bloody drainage on dressing.  Ostomy with some sweat in the bag, but not significant output.  Stoma is pink and viable. GU: foley in place with clear yellow output  Lab Results:   Recent Labs  09/10/15 0710 09/12/15 0548  WBC 8.2 15.5*  HGB 11.7* 13.0  HCT 35.7* 39.8  PLT 339 345   BMET  Recent Labs  09/11/15 0526 09/12/15 0548  NA 140 135  K 3.7 3.7  CL 107 104  CO2 24 25  GLUCOSE 116* 207*  BUN 18 11  CREATININE 0.75 0.64  CALCIUM 8.5* 8.4*   PT/INR  Recent Labs  09/09/15 1615  LABPROT 12.9  INR 0.95   CMP     Component Value Date/Time   NA 135 09/12/2015 0548   K 3.7 09/12/2015 0548   CL 104 09/12/2015 0548   CO2 25 09/12/2015 0548   GLUCOSE 207* 09/12/2015 0548   BUN 11 09/12/2015 0548   CREATININE 0.64 09/12/2015 0548   CALCIUM 8.4* 09/12/2015 0548   PROT 5.9* 09/11/2015 0526   ALBUMIN 2.9* 09/11/2015 0526   AST 15 09/11/2015 0526   ALT 10* 09/11/2015 0526   ALKPHOS 59 09/11/2015 0526   BILITOT 0.6 09/11/2015 0526   GFRNONAA >60 09/12/2015 0548   GFRAA >60 09/12/2015 0548   Lipase     Component Value Date/Time   LIPASE 19* 09/09/2015 1020       Studies/Results: No results  found.  Anti-infectives: Anti-infectives    Start     Dose/Rate Route Frequency Ordered Stop   09/11/15 0600  clindamycin (CLEOCIN) IVPB 900 mg     900 mg 100 mL/hr over 30 Minutes Intravenous On call to O.R. 09/10/15 1623 09/11/15 1108   09/11/15 0600  gentamicin (GARAMYCIN) IVPB 100 mg  Status:  Discontinued     100 mg 200 mL/hr over 30 Minutes Intravenous On call to O.R. 09/10/15 1623 09/10/15 1631   09/11/15 0600  gentamicin (GARAMYCIN) 350 mg in dextrose 5 % 100 mL IVPB     350 mg 217.5 mL/hr over 30 Minutes Intravenous On call to O.R. 09/10/15 1630 09/11/15 1150       Assessment/Plan  POD 1, s/p sigmoid colectomy, colostomy for obstructing colon mass -leave NGT for today, suspect this may be able to be DC tomorrow -WOC for new ostomy care -mobilize and pulm toilet today -PT eval and treat -await path -DC foley, has good UOP    LOS: 3 days    Lailoni Baquera E 09/12/2015, 8:08 AM Pager: [phone]

## 2015-09-12 NOTE — Progress Notes (Signed)
PT Cancellation Note  Patient Details Name: HALFORD GOETZKE MRN: [id number] DOB: [birth year]   Cancelled Treatment:    Reason Eval/Treat Not Completed: Other (comment) (RN just assisted patent back to bed. will return 10/8.)   Claretha Cooper 09/12/2015, 4:48 PM Tresa Endo PT [phone]

## 2015-09-13 ENCOUNTER — Inpatient Hospital Stay (HOSPITAL_COMMUNITY): Payer: Medicare Other

## 2015-09-13 DIAGNOSIS — R7989 Other specified abnormal findings of blood chemistry: Secondary | ICD-10-CM

## 2015-09-13 DIAGNOSIS — R042 Hemoptysis: Secondary | ICD-10-CM | POA: Insufficient documentation

## 2015-09-13 DIAGNOSIS — R778 Other specified abnormalities of plasma proteins: Secondary | ICD-10-CM

## 2015-09-13 DIAGNOSIS — I2510 Atherosclerotic heart disease of native coronary artery without angina pectoris: Secondary | ICD-10-CM

## 2015-09-13 LAB — GLUCOSE, CAPILLARY
GLUCOSE-CAPILLARY: 171 mg/dL — AB (ref 65–99)
GLUCOSE-CAPILLARY: 154 mg/dL — AB (ref 65–99)
GLUCOSE-CAPILLARY: 129 mg/dL — AB (ref 65–99)
GLUCOSE-CAPILLARY: 125 mg/dL — AB (ref 65–99)
Glucose-Capillary: 151 mg/dL — ABNORMAL HIGH (ref 65–99)
Glucose-Capillary: 165 mg/dL — ABNORMAL HIGH (ref 65–99)
Glucose-Capillary: 167 mg/dL — ABNORMAL HIGH (ref 65–99)

## 2015-09-13 LAB — BASIC METABOLIC PANEL
Anion gap: 5 (ref 5–15)
BUN: 11 mg/dL (ref 6–20)
CALCIUM: 8.2 mg/dL — AB (ref 8.9–10.3)
CO2: 27 mmol/L (ref 22–32)
CREATININE: 0.62 mg/dL (ref 0.61–1.24)
Chloride: 107 mmol/L (ref 101–111)
GFR calc Af Amer: >60 mL/min (ref >60)
GFR calc non Af Amer: >60 mL/min (ref >60)
GLUCOSE: 169 mg/dL — AB (ref 65–99)
Potassium: 3.5 mmol/L (ref 3.5–5.1)
Sodium: 139 mmol/L (ref 135–145)

## 2015-09-13 LAB — CBC
HEMATOCRIT: 35 % — AB (ref 39.0–52.0)
Hemoglobin: 11.3 g/dL — ABNORMAL LOW (ref 13.0–17.0)
MCH: 31 pg (ref 26.0–34.0)
MCHC: 32.3 g/dL (ref 30.0–36.0)
MCV: 96.2 fL (ref 78.0–100.0)
Platelets: 300 10*3/uL (ref 150–400)
RBC: 3.64 MIL/uL — ABNORMAL LOW (ref 4.22–5.81)
RDW: 13.5 % (ref 11.5–15.5)
WBC: 12.6 10*3/uL — ABNORMAL HIGH (ref 4.0–10.5)

## 2015-09-13 NOTE — Progress Notes (Signed)
Patient ID: Kenneth [name], male   DOB: 10/07/1928, 81 y.o.   MRN: [id number]    Primary cardiologist:  Subjective:    No complaints  Objective:   Temp:  [96.7 F (35.9 C)-98.3 F (36.8 C)] 98.3 F (36.8 C) (10/08 0532) Pulse Rate:  [86-97] 95 (10/08 0532) Resp:  [18-20] 18 (10/08 0532) BP: (151-184)/(65-74) 154/68 mmHg (10/08 0532) SpO2:  [97 %-100 %] 97 % (10/08 0532) Last BM Date: 09/07/15  Filed Weights   09/09/15 1507  Weight: 155 lb 6.8 oz (70.5 kg)    Intake/Output Summary (Last 24 hours) at 09/13/15 0813 Last data filed at 09/13/15 0600  Gross per 24 hour  Intake   2405 ml  Output   1250 ml  Net   1155 ml    Telemetry: NSR  Exam:  General: NAD  Resp: CTAB  Cardiac: RRR, no m/r/g, no jvd  GI: abdomen soft, mildly tender to palpation, ND  MSK: no LE edema  Neuro:no focal deficits    Lab Results:  Basic Metabolic Panel:  Recent Labs Lab 09/11/15 0526 09/12/15 0548 09/13/15 0420  NA 140 135 139  K 3.7 3.7 3.5  CL 107 104 107  CO2 24 25 27   GLUCOSE 116* 207* 169*  BUN 18 11 11   CREATININE 0.75 0.64 0.62  CALCIUM 8.5* 8.4* 8.2*    Liver Function Tests:  Recent Labs Lab 09/09/15 1020 09/11/15 0526  AST 21 15  ALT 14* 10*  ALKPHOS 73 59  BILITOT 0.5 0.6  PROT 7.0 5.9*  ALBUMIN 3.5 2.9*    CBC:  Recent Labs Lab 09/10/15 0710 09/12/15 0548 09/13/15 0420  WBC 8.2 15.5* 12.6*  HGB 11.7* 13.0 11.3*  HCT 35.7* 39.8 35.0*  MCV 95.2 94.1 96.2  PLT 339 345 300    Cardiac Enzymes:  Recent Labs Lab 09/10/15 1921 09/10/15 2358 09/11/15 0526  TROPONINI 0.12* 0.07* 0.07*    BNP: No results for input(s): PROBNP in the last 8760 hours.  Coagulation:  Recent Labs Lab 09/09/15 1615  INR 0.95    ECG:   Medications:   Scheduled Medications: . enoxaparin (LOVENOX) injection  40 mg Subcutaneous Q24H  . insulin aspart  0-9 Units Subcutaneous TID WC  . latanoprost  1 drop Both Eyes QHS  . levothyroxine  25 mcg  Intravenous Daily  . metoprolol  2.5 mg Intravenous 4 times per day  . pantoprazole (PROTONIX) IV  40 mg Intravenous Q24H     Infusions: . dextrose 5 % and 0.9% NaCl 100 mL/hr at 09/13/15 0600     PRN Medications:  acetaminophen, hydrALAZINE, HYDROmorphone (DILAUDID) injection, ondansetron **OR** ondansetron (ZOFRAN) IV, promethazine     Assessment/Plan    1.CAD - history of prior CABG.  - mild trop elevation now trending down, suspect demand ischemia. Peak of 0.15. No cardiac symptoms - he has ASA allergy. On IV metoprolol only as he is NPO. Restart statin when taking oral, would also benefit from long term ACE-I given his known CAD and DM2.    2. Abdominal mass - s/p laparaotomy and sigmoid colectomy 09/11/15    No new cardiac recs at this time. Please call over weekend with questions.    Carlyle Dolly, M.D.

## 2015-09-13 NOTE — Progress Notes (Signed)
[facility] PROGRESS NOTE  AMALIO LOE EXB:[id number] DOB: 10/16/1928 DOA: 09/09/2015 PCP: Irven Shelling, MD  Assessment/Plan: 1. Elevated troponin-  improving, repeat troponin is 0.07. Blood pressure is well controlled. Patient denies any chest pain ,was seen by cardiology and no further intervention recommended.  2. Hypertension- patient is currently nothing by mouth, will start the patient on IV metoprolol 2.5 mg every 6 hours. continue when necessary hydralazine  3. Hemoptysis- patient had episode of mumps is last night which could also be due to NG tube trauma, chest x-ray shows bibasilar atelectasis with left pleural effusion. We'll keep a close watch on IV fluids as patient may develop fluid overload. NG tube will be discontinued per surgery today,  will observe. 4.  Diabetes mellitus-patient is currently nothing by mouth, continue sliding scale insulin with NovoLog 5. S/p sigmoid colectomy, colostomy for obstructing colon mass- patient was evaluated by GI, and  Found not a candidate for colonic stent given the large size of the mass. Underwent  Sigmoid colectomy 6. DVT prophylaxis- Lovenox  Code Status: Full code Family Communication: *No family at bedside Disposition Plan: Patient to undergo colectomy , likely skilled nursing facility at the time of discharge   Consultants:  Surgery  Gastroenterology  Procedures:  None  Antibiotics:  None  HPI/Subjective: 81 year old male who came to the hospital with complaint of several months duration of progressively worsening left lower quadrant abdominal pain the pain has been getting worse and now 10/10 in intensity. In the ED imaging study showed colonic mass with question small bowel obstruction. General surgery has been consulted, and they recommended gastroenterology to see the patient for possible colonic  Stenting. Patient seen and examined, last night has episode of hemoptysis. Also has NG in  place.   Objective: Filed Vitals:   09/13/15 1200  BP: 157/72  Pulse: 92  Temp:   Resp:     Intake/Output Summary (Last 24 hours) at 09/13/15 1312 Last data filed at 09/13/15 1145  Gross per 24 hour  Intake   2405 ml  Output   1250 ml  Net   1155 ml   Filed Weights   09/09/15 1507  Weight: 70.5 kg (155 lb 6.8 oz)    Exam:   General:  Appears in no acute distress, NG tube in place  Cardiovascular: S1-S2 regular  Respiratory: Clear to auscultation bilaterally  Abdomen: Soft, mild tenderness in the left lower quadrant, no rigidity, no guarding ,no organomegaly  Musculoskeletal: No edema/cyanosis/clubbing noted in the lower extremities   Data Reviewed: Basic Metabolic Panel:  Recent Labs Lab 09/09/15 1020 09/10/15 0710 09/11/15 0526 09/12/15 0548 09/13/15 0420  NA 138 137 140 135 139  K 3.7 3.6 3.7 3.7 3.5  CL 105 105 107 104 107  CO2 27 24 24 25 27   GLUCOSE 144* 121* 116* 207* 169*  BUN 20 17 18 11 11   CREATININE 0.74 0.66 0.75 0.64 0.62  CALCIUM 9.1 8.5* 8.5* 8.4* 8.2*   Liver Function Tests:  Recent Labs Lab 09/09/15 1020 09/11/15 0526  AST 21 15  ALT 14* 10*  ALKPHOS 73 59  BILITOT 0.5 0.6  PROT 7.0 5.9*  ALBUMIN 3.5 2.9*    Recent Labs Lab 09/09/15 1020  LIPASE 19*   No results for input(s): AMMONIA in the last 168 hours. CBC:  Recent Labs Lab 09/09/15 1020 09/10/15 0710 09/12/15 0548 09/13/15 0420  WBC 9.5 8.2 15.5* 12.6*  HGB 13.1 11.7* 13.0 11.3*  HCT 39.9 35.7* 39.8 35.0*  MCV 94.3 95.2 94.1 96.2  PLT 341 339 345 300   Cardiac Enzymes:  Recent Labs Lab 09/10/15 0710 09/10/15 1247 09/10/15 1921 09/10/15 2358 09/11/15 0526  TROPONINI 0.14* 0.15* 0.12* 0.07* 0.07*    CBG:  Recent Labs Lab 09/12/15 1957 09/13/15 0028 09/13/15 0334 09/13/15 0743 09/13/15 1202  GLUCAP 135* 165* 167* 171* 154*    Recent Results (from the past 240 hour(s))  Surgical pcr screen     Status: None   Collection Time: 09/11/15   3:55 AM  Result Value Ref Range Status   MRSA, PCR NEGATIVE NEGATIVE Final   Staphylococcus aureus NEGATIVE NEGATIVE Final    Comment:        The Xpert SA Assay (FDA approved for NASAL specimens in patients over 36 years of age), is one component of a comprehensive surveillance program.  Test performance has been validated by [facility] for patients greater than or equal to 67 year old. It is not intended to diagnose infection nor to guide or monitor treatment.      Studies: Dg Chest 2 View  09/13/2015   CLINICAL DATA:  Hemoptysis, diabetes, hypertension  EXAM: CHEST - 2 VIEW  COMPARISON:  07/17/2013  FINDINGS: Previous CABG. Nasogastric tube into a hiatal hernia. Atelectasis/ infiltrate in the lung bases has increased, left worse than right. Heart size upper limits normal. Suspect small left pleural effusion as well. The right posterior costophrenic angle is excluded from the lateral.  IMPRESSION: 1. Nasogastric tube to hiatal hernia. 2. Increase in bibasilar atelectasis/infiltrate, left greater than right, with possible left effusion.   Electronically Signed   By: Lucrezia Europe M.D.   On: 09/13/2015 09:44    Scheduled Meds: . enoxaparin (LOVENOX) injection  40 mg Subcutaneous Q24H  . insulin aspart  0-9 Units Subcutaneous TID WC  . latanoprost  1 drop Both Eyes QHS  . levothyroxine  25 mcg Intravenous Daily  . metoprolol  2.5 mg Intravenous 4 times per day  . pantoprazole (PROTONIX) IV  40 mg Intravenous Q24H   Continuous Infusions: . dextrose 5 % and 0.9% NaCl 100 mL/hr at 09/13/15 0600    Principal Problem:   Nausea and vomiting Active Problems:   Coronary atherosclerosis of autologous vein bypass graft   Hyperlipidemia   Nephrolithiasis   Glaucoma   Preop cardiovascular exam   Small bowel obstruction (HCC)   Hypertensive urgency   Type II diabetes mellitus with ophthalmic manifestations, glaucoma   Colonic mass   Intestinal obstruction (HCC)   CAD (coronary artery  disease)   Elevated troponin    Time spent: 25 min    [facility]  [facility] Pager [phone]*. If 7PM-7AM, please contact night-coverage at [email or web address], password [facility] 09/13/2015, 1:12 PM  LOS: 4 days

## 2015-09-13 NOTE — Evaluation (Signed)
Physical Therapy Evaluation Patient Details Name: Kenneth [name] MRN: [id number] DOB: January 11, 1928 Today's Date: 09/13/2015   History of Present Illness  EXPLORATORY LAPAROTOMY, SIGMOID COLECTOMY AND COLOSTOMY - 09/11/2015   Clinical Impression  Patient reports that he felt like he was going to pass out when he went for xray. He did fine with PT eval. BP 150/54 supine to 183/76 after transfer to recliner. Patient's wife came into room at the end of the session. PT feels patient may benefit from post acute rehab at SNF , Concern for wife's ability to care for patient. Patient will benefit fromPT to address problems listed in note below.    Follow Up Recommendations SNF;Supervision/Assistance - 24 hour    Equipment Recommendations  Rolling walker with 5" wheels    Recommendations for Other Services       Precautions / Restrictions Precautions Precaution Comments: NG suction, colostomy      Mobility  Bed Mobility Overal bed mobility: Needs Assistance;+ 2 for safety/equipment;+2 for physical assistance Bed Mobility: Supine to Sit     Supine to sit: Max assist;HOB elevated;+2 for safety/equipment;+2 for physical assistance     General bed mobility comments: cues for rolling first, used HOB to assist sitting, use of bed pad.  Transfers Overall transfer level: Needs assistance Equipment used: Rolling walker (2 wheeled) Transfers: Sit to/from [facility] Sit to Stand: Mod assist;+2 physical assistance;+2 safety/equipment;From elevated surface Stand pivot transfers: Mod assist;+2 physical assistance;+2 safety/equipment       General transfer comment: cues for safety and hand placement, small steps to get to recliner  Ambulation/Gait                Stairs            Wheelchair Mobility    Modified Rankin (Stroke Patients Only)       Balance Overall balance assessment: Needs assistance Sitting-balance support: Bilateral upper extremity  supported;Feet supported Sitting balance-Leahy Scale: Fair                                       Pertinent Vitals/Pain Pain Assessment: Faces Pain Score: 6  Faces Pain Scale: Hurts even more Pain Location: abdomen Pain Descriptors / Indicators: Cramping;Tightness Pain Intervention(s): Limited activity within patient's tolerance;Monitored during session;Premedicated before session    Home Living Family/patient expects to be discharged to:: Private residence Living Arrangements: Spouse/significant other Available Help at Discharge: Family Type of Home: House           Additional Comments: did not get info from patient today.    Prior Function                 Hand Dominance        Extremity/Trunk Assessment   Upper Extremity Assessment: Generalized weakness           Lower Extremity Assessment: Generalized weakness         Communication      Cognition Arousal/Alertness: Awake/alert Behavior During Therapy: WFL for tasks assessed/performed Overall Cognitive Status: Difficult to assess                      General Comments      Exercises        Assessment/Plan    PT Assessment Patient needs continued PT services  PT Diagnosis Difficulty walking;Generalized weakness;Acute pain   PT Problem List Decreased strength;Decreased range of motion;Decreased  activity tolerance;Decreased mobility;Decreased knowledge of use of DME;Decreased safety awareness;Decreased knowledge of precautions;Pain  PT Treatment Interventions DME instruction;Gait training;Functional mobility training;Therapeutic activities;Therapeutic exercise;Patient/family education   PT Goals (Current goals can be found in the Care Plan section) Acute Rehab PT Goals Patient Stated Goal: agreed to get OOB PT Goal Formulation: With patient Time For Goal Achievement: 09/27/15 Potential to Achieve Goals: Good    Frequency Min 3X/week   Barriers to discharge  Decreased caregiver support      Co-evaluation               End of Session   Activity Tolerance: Patient limited by fatigue Patient left: in chair;with call bell/phone within reach;with chair alarm set;with family/visitor present Nurse Communication: Mobility status         Time: 3710-6269 PT Time Calculation (min) (ACUTE ONLY): 34 min   Charges:   PT Evaluation $Initial PT Evaluation Tier I: 1 Procedure PT Treatments $Therapeutic Activity: 8-22 mins   PT G Codes:        Claretha Cooper 09/13/2015, 12:47 PM Tresa Endo PT [phone]

## 2015-09-13 NOTE — Progress Notes (Signed)
General Surgery Note  LOS: 4 days  POD -  2 Days Post-Op  Assessment/Plan: 1.  EXPLORATORY LAPAROTOMY, SIGMOID COLECTOMY AND COLOSTOMY - 09/11/2015 - [place]  Will try NGT out - but keep NPO until ostomy shows function  2.  CAD  History of CABG  Elevated Troponin - possible demand ischemia - seen by Dr. Lenna Sciara. Harl Bowie 3.  HTN 4.  DM  Gluc - 169 - 09/13/2015 5.  LDVT prophylaxis - Lovenox   Principal Problem:   Nausea and vomiting Active Problems:   Coronary atherosclerosis of autologous vein bypass graft   Hyperlipidemia   Nephrolithiasis   Glaucoma   Preop cardiovascular exam   Small bowel obstruction (HCC)   Hypertensive urgency   Type II diabetes mellitus with ophthalmic manifestations, glaucoma   Colonic mass   Intestinal obstruction (HCC)   CAD (coronary artery disease)   Elevated troponin   Subjective:  Alert.  Reasonably comfortable.  No specific complaint Objective:   Filed Vitals:   09/13/15 0532  BP: 154/68  Pulse: 95  Temp: 98.3 F (36.8 C)  Resp: 18     Intake/Output from previous day:  10/07 0701 - 10/08 0700 In: 2405 [I.V.:2405] Out: 1250 [Urine:700; Emesis/NG output:350; Stool:200]  Intake/Output this shift:      Physical Exam:   General: Older WM who is alert and oriented.    HEENT: Normal. Pupils equal. .   Lungs: Clear.  IS - 1,000cc   Abdomen:  Soft - ostomy to left abdomen.  Rare BS   Wound: dressing intact    Lab Results:    Recent Labs  09/12/15 0548 09/13/15 0420  WBC 15.5* 12.6*  HGB 13.0 11.3*  HCT 39.8 35.0*  PLT 345 300    BMET   Recent Labs  09/12/15 0548 09/13/15 0420  NA 135 139  K 3.7 3.5  CL 104 107  CO2 25 27  GLUCOSE 207* 169*  BUN 11 11  CREATININE 0.64 0.62  CALCIUM 8.4* 8.2*    PT/INR  No results for input(s): LABPROT, INR in the last 72 hours.  ABG  No results for input(s): PHART, HCO3 in the last 72 hours.  Invalid input(s): PCO2, PO2   Studies/Results:  Dg Chest 2 View  09/13/2015    CLINICAL DATA:  Hemoptysis, diabetes, hypertension  EXAM: CHEST - 2 VIEW  COMPARISON:  07/17/2013  FINDINGS: Previous CABG. Nasogastric tube into a hiatal hernia. Atelectasis/ infiltrate in the lung bases has increased, left worse than right. Heart size upper limits normal. Suspect small left pleural effusion as well. The right posterior costophrenic angle is excluded from the lateral.  IMPRESSION: 1. Nasogastric tube to hiatal hernia. 2. Increase in bibasilar atelectasis/infiltrate, left greater than right, with possible left effusion.   Electronically Signed   By: Lucrezia Europe M.D.   On: 09/13/2015 09:44     Anti-infectives:   Anti-infectives    Start     Dose/Rate Route Frequency Ordered Stop   09/11/15 0600  clindamycin (CLEOCIN) IVPB 900 mg     900 mg 100 mL/hr over 30 Minutes Intravenous On call to O.R. 09/10/15 1623 09/11/15 1108   09/11/15 0600  gentamicin (GARAMYCIN) IVPB 100 mg  Status:  Discontinued     100 mg 200 mL/hr over 30 Minutes Intravenous On call to O.R. 09/10/15 1623 09/10/15 1631   09/11/15 0600  gentamicin (GARAMYCIN) 350 mg in dextrose 5 % 100 mL IVPB     350 mg 217.5 mL/hr over 30 Minutes  Intravenous On call to O.R. 09/10/15 1630 09/11/15 [place], MD, FACS Pager: [phone] Surgery Office: [phone] 09/13/2015

## 2015-09-14 LAB — GLUCOSE, CAPILLARY
GLUCOSE-CAPILLARY: 142 mg/dL — AB (ref 65–99)
GLUCOSE-CAPILLARY: 153 mg/dL — AB (ref 65–99)
Glucose-Capillary: 98 mg/dL (ref 65–99)
Glucose-Capillary: 149 mg/dL — ABNORMAL HIGH (ref 65–99)
Glucose-Capillary: 147 mg/dL — ABNORMAL HIGH (ref 65–99)
Glucose-Capillary: 117 mg/dL — ABNORMAL HIGH (ref 65–99)

## 2015-09-14 MED ORDER — HYDROCODONE-ACETAMINOPHEN 5-325 MG PO TABS
1.0000 | ORAL_TABLET | ORAL | Status: DC | PRN
  Administered 2015-09-16 (×2): 2 via ORAL
  Administered 2015-09-17: 1 via ORAL
  Administered 2015-09-17: 2 via ORAL
  Administered 2015-09-17: 1 via ORAL
  Filled 2015-09-14 (×2): qty 1
  Filled 2015-09-14 (×3): qty 2

## 2015-09-14 NOTE — Progress Notes (Signed)
Patient ID: Kenneth [name], male   DOB: [birth year], 81 y.o.   MRN: [id number] 3 Days Post-Op  Subjective: Says he  Is "starving" although had a little nausea earlier this morning relieved with meds. He is concerned that he has been seeing things on the ceiling that are there. Denies severe pain.  Objective: Vital signs in last 24 hours: Temp:  [98.6 F (37 C)-100.1 F (37.8 C)] 98.6 F (37 C) (10/09 0413) Pulse Rate:  [72-96] 72 (10/09 0836) Resp:  [18-20] 18 (10/09 0413) BP: (142-173)/(55-72) 157/66 mmHg (10/09 0836) SpO2:  [95 %-100 %] 95 % (10/09 0836) Last BM Date: 09/07/15  Intake/Output from previous day: 10/08 0701 - 10/09 0700 In: 1600 [I.V.:1600] Out: 1050 [Urine:1050] Intake/Output this shift:    General appearance: alert, cooperative and no distress Resp: no wheezing or increased work of breathing. GI: nondistended. Soft with mild appropriate tenderness around the incision. Stoma with slight  liquid output. Incision/Wound: dressing with old bloody drainage and no erythema  Lab Results:   Recent Labs  09/12/15 0548 09/13/15 0420  WBC 15.5* 12.6*  HGB 13.0 11.3*  HCT 39.8 35.0*  PLT 345 300   BMET  Recent Labs  09/12/15 0548 09/13/15 0420  NA 135 139  K 3.7 3.5  CL 104 107  CO2 25 27  GLUCOSE 207* 169*  BUN 11 11  CREATININE 0.64 0.62  CALCIUM 8.4* 8.2*     Studies/Results: Dg Chest 2 View  09/13/2015   CLINICAL DATA:  Hemoptysis, diabetes, hypertension  EXAM: CHEST - 2 VIEW  COMPARISON:  07/17/2013  FINDINGS: Previous CABG. Nasogastric tube into a hiatal hernia. Atelectasis/ infiltrate in the lung bases has increased, left worse than right. Heart size upper limits normal. Suspect small left pleural effusion as well. The right posterior costophrenic angle is excluded from the lateral.  IMPRESSION: 1. Nasogastric tube to hiatal hernia. 2. Increase in bibasilar atelectasis/infiltrate, left greater than right, with possible left effusion.    Electronically Signed   By: Lucrezia Europe M.D.   On: 09/13/2015 09:44    Anti-infectives: Anti-infectives    Start     Dose/Rate Route Frequency Ordered Stop   09/11/15 0600  clindamycin (CLEOCIN) IVPB 900 mg     900 mg 100 mL/hr over 30 Minutes Intravenous On call to O.R. 09/10/15 1623 09/11/15 1108   09/11/15 0600  gentamicin (GARAMYCIN) IVPB 100 mg  Status:  Discontinued     100 mg 200 mL/hr over 30 Minutes Intravenous On call to O.R. 09/10/15 1623 09/10/15 1631   09/11/15 0600  gentamicin (GARAMYCIN) 350 mg in dextrose 5 % 100 mL IVPB     350 mg 217.5 mL/hr over 30 Minutes Intravenous On call to O.R. 09/10/15 1630 09/11/15 1150      Assessment/Plan: s/p Procedure(s): EXPLORATORY LAPAROTOMY SIGMOID COLECTOMY AND COLOSTOMY Stable postoperatively. Start clear liquid diet. Oral pain medication ordered. Possibly hallucinations related to IV narcotics. Out of bed to chair.   LOS: 5 days    Zali Kamaka T 09/14/2015

## 2015-09-14 NOTE — Clinical Social Work Note (Signed)
Clinical Social Work Assessment  Patient Details  Name: Kenneth [name] MRN: [id number] Date of Birth: 02/04/1928  Date of referral:  09/14/15               Reason for consult:  Facility Placement, Suicide Risk/Attempt                Permission sought to share information with:  Family Supports Permission granted to share information::  No  Name::        Agency::     Relationship::     Contact Information:     Housing/Transportation Living arrangements for the past 2 months:  Single Family Home Source of Information:  Patient Patient Interpreter Needed:  None Criminal Activity/Legal Involvement Pertinent to Current Situation/Hospitalization:  No - Comment as needed Significant Relationships:  Spouse Lives with:  Spouse Do you feel safe going back to the place where you live?  No Need for family participation in patient care:  Yes (Comment)  Care giving concerns:  Pt lives at home with wife who pt reports is also physically impaired- pt states he doesn't know how he would get along at home   Social Worker assessment / plan:  CSW met with pt at bedside to discuss PT recommendation for SNF  Employment status:  Retired Nurse, adult PT Recommendations:  [place] / Referral to community resources:  [place]  Patient/Family's Response to care: Pt was initially pleasant but continued to say how he has intense pain in his abdomen- states that he does not want to move because of the pain he is having.  CSW explained SNF and purpose of SNF- pt acknowledges that he does not have sufficient support at home but then states he does not want to go to SNF- states that they would make him move around which he does not want to do.    Pt stated that he was confused- CSW requested to speak with pt wife concerning plan for pt at time of DC since pt was feeling overwhelmed- pt became agitated- pt stated that he did not want CSW  contacting his wife  Pt continued to refer back to his pain and stated that he "wanted someone to bring him a firearm so that he could kill himself"- CSW inquired if pt was serious about wanting to die and pt stated that he just doesn't care anymore and doesn't want to be alive any longer  Pt stated that he would not go to SNF ("torture chamber") and requested that CSW leave the room  CSW informed the RN and MD about pt statement that he wanted to kill himself- RN will initiate suicide precautions and MD to consult psych- RN also reported pt stated he was having hallucinations  Patient/Family's Understanding of and Emotional Response to Diagnosis, Current Treatment, and Prognosis:  Unclear- pt appears to be confused despite being able to answer questions appropriately- pt also does not acknowledge need for PT in his recovery  Emotional Assessment Appearance:  Appears stated age Attitude/Demeanor/Rapport:  Complaining, Guarded Affect (typically observed):  Frustrated Orientation:  Oriented to Self, Oriented to Place, Oriented to  Time, Oriented to Situation Alcohol / Substance use:  Not Applicable Psych involvement (Current and /or in the community):  Yes (Comment) (ordered 10/9)  Discharge Needs  Concerns to be addressed:  Cognitive Concerns, Care Coordination Readmission within the last 30 days:  No Current discharge risk:  Physical Impairment Barriers to Discharge:  Continued Medical  Work up, Requiring sitter/restraints   Cranford Mon, LCSW 09/14/2015, 11:31 AM

## 2015-09-14 NOTE — Progress Notes (Addendum)
CSW met with pt at bedside- pt adamantly against going to SNF- RNCM informed  CSW signing off- please reconsult if needed  Domenica Reamer, [place] Social Worker [phone]

## 2015-09-14 NOTE — Care Management Note (Addendum)
Case Management Note  Patient Details  Name: Kenneth [name] MRN: [id number] Date of Birth: 01-Aug-1928  Subjective/Objective:       Small bowel obstruction             Action/Plan: NCM spoke to pt and gave permission to speak to wife, [place] and dtr, [place]. Pt states he prefers to dc to home with [facility]. States he has RW and 3n1 at home. Wife states her assistance at home will be limited. She will not be able to assist him with ambulating or lifting him at home. Their son lives in the home but he works in the morning and does not get home until 1:30 pm. Dtr, Erasmo Downer states she will check his retirement benefits to see if pt has [place]. Explained they could hire an aide and pay out of pocket for services. Provided pt with [facility] provider list. Requesting [facility] for [facility]. Will have weekday NCM notify [facility] liaison of new referral. Waiting for final recommendation and orders for [facility].   Expected Discharge Date:   (unknown)               Expected Discharge Plan:  [place]  In-House Referral:  Clinical Social Work  Discharge planning Services  CM Consult  Post Acute Care Choice:  Home Health Choice offered to:  Patient, Spouse   HH Arranged:  PT [place]:     Status of Service:  Completed, signed off  Medicare Important Message Given:  Yes-second notification given Date Medicare IM Given:    Medicare IM give by:    Date Additional Medicare IM Given:    Additional Medicare Important Message give by:     If discussed at [place] of Stay Meetings, dates discussed:    Additional Comments:  Erenest Rasher, RN 09/14/2015, 5:03 PM

## 2015-09-14 NOTE — Progress Notes (Signed)
[facility] PROGRESS NOTE  Kenneth [name] WUX:[id number] DOB: 10-12-1928 DOA: 09/09/2015 PCP: Irven Shelling, MD  Assessment/Plan: 1. Elevated troponin-  improving, repeat troponin is 0.07. Blood pressure is well controlled. Patient denies any chest pain ,was seen by cardiology and no further intervention recommended.  2. Hypertension- patient is currently nothing by mouth, will start the patient on IV metoprolol 2.5 mg every 6 hours. continue when necessary hydralazine  3. Hemoptysis-resolved,  patient had episode of hemoptysis last night which could also be due to NG tube trauma, chest x-ray shows bibasilar atelectasis with left pleural effusion. We'll keep a close watch on IV fluids as patient may develop fluid overload. NG tube discontinued. 4.  Diabetes mellitus-patient is currently nothing by mouth, continue sliding scale insulin with NovoLog 5. S/p sigmoid colectomy, colostomy for obstructing colon mass- patient was evaluated by GI, and  Found not a candidate for colonic stent given the large size of the mass. Underwent  Sigmoid colectomy. 6. ? Suicidal ideation- patient said that he wanted somebody to get a firearm to care for himself clinical social worker, later said that he does not have any plan or thoughts of killing himself. He only said that so the clinical social worker can leave the room.. Patient did not want to go to skilled nursing facility. 7. DVT prophylaxis- Lovenox  Code Status: Full code Family Communication: *Discussed with family members at bedside Disposition Plan:  skilled nursing facility at the time of discharge   Consultants:  Surgery  Gastroenterology  Procedures:  None  Antibiotics:  None  HPI/Subjective: 81 year old male who came to the hospital with complaint of several months duration of progressively worsening left lower quadrant abdominal pain the pain has been getting worse and now 10/10 in intensity. In the ED imaging study showed  colonic mass with question small bowel obstruction. General surgery has been consulted, and they recommended gastroenterology to see the patient for possible colonic  Stenting. Patient seen and examined, no complaints today. CXR shows bibasilar atelectasis. This morning patient was seen by clinical social worker for facility placement, and that time he mentioned that He wanted someone to bring him of firearms we could kill himself.  When I asked the patient about suicide ideation and thoughts, patient says that he was joking as he did not want to go to skilled facility and he wanted the clinical social worker to go out of the room.  This was confirmed with RN at bedside, patient denies suicidal ideation or plan at this time.  Ob jective: Filed Vitals:   09/14/15 1119  BP: 116/87  Pulse:   Temp:   Resp:     Intake/Output Summary (Last 24 hours) at 09/14/15 1237 Last data filed at 09/14/15 0418  Gross per 24 hour  Intake   1600 ml  Output    850 ml  Net    750 ml   Filed Weights   09/09/15 1507  Weight: 70.5 kg (155 lb 6.8 oz)    Exam:   General:  Appears in no acute distress, NG tube in place  Cardiovascular: S1-S2 regular  Respiratory: Clear to auscultation bilaterally  Abdomen: Soft, mild tenderness in the left lower quadrant, no rigidity, no guarding ,no organomegaly  Musculoskeletal: No edema/cyanosis/clubbing noted in the lower extremities   Data Reviewed: Basic Metabolic Panel:  Recent Labs Lab 09/09/15 1020 09/10/15 0710 09/11/15 0526 09/12/15 0548 09/13/15 0420  NA 138 137 140 135 139  K 3.7 3.6 3.7 3.7 3.5  CL  105 105 107 104 107  CO2 27 24 24 25 27   GLUCOSE 144* 121* 116* 207* 169*  BUN 20 17 18 11 11   CREATININE 0.74 0.66 0.75 0.64 0.62  CALCIUM 9.1 8.5* 8.5* 8.4* 8.2*   Liver Function Tests:  Recent Labs Lab 09/09/15 1020 09/11/15 0526  AST 21 15  ALT 14* 10*  ALKPHOS 73 59  BILITOT 0.5 0.6  PROT 7.0 5.9*  ALBUMIN 3.5 2.9*     Recent Labs Lab 09/09/15 1020  LIPASE 19*   No results for input(s): AMMONIA in the last 168 hours. CBC:  Recent Labs Lab 09/09/15 1020 09/10/15 0710 09/12/15 0548 09/13/15 0420  WBC 9.5 8.2 15.5* 12.6*  HGB 13.1 11.7* 13.0 11.3*  HCT 39.9 35.7* 39.8 35.0*  MCV 94.3 95.2 94.1 96.2  PLT 341 339 345 300   Cardiac Enzymes:  Recent Labs Lab 09/10/15 0710 09/10/15 1247 09/10/15 1921 09/10/15 2358 09/11/15 0526  TROPONINI 0.14* 0.15* 0.12* 0.07* 0.07*    CBG:  Recent Labs Lab 09/13/15 2024 09/13/15 2357 09/14/15 0416 09/14/15 0756 09/14/15 1158  GLUCAP 125* 151* 98 149* 147*    Recent Results (from the past 240 hour(s))  Surgical pcr screen     Status: None   Collection Time: 09/11/15  3:55 AM  Result Value Ref Range Status   MRSA, PCR NEGATIVE NEGATIVE Final   Staphylococcus aureus NEGATIVE NEGATIVE Final    Comment:        The Xpert SA Assay (FDA approved for NASAL specimens in patients over 44 years of age), is one component of a comprehensive surveillance program.  Test performance has been validated by [facility] for patients greater than or equal to 11 year old. It is not intended to diagnose infection nor to guide or monitor treatment.      Studies: Dg Chest 2 View  09/13/2015   CLINICAL DATA:  Hemoptysis, diabetes, hypertension  EXAM: CHEST - 2 VIEW  COMPARISON:  07/17/2013  FINDINGS: Previous CABG. Nasogastric tube into a hiatal hernia. Atelectasis/ infiltrate in the lung bases has increased, left worse than right. Heart size upper limits normal. Suspect small left pleural effusion as well. The right posterior costophrenic angle is excluded from the lateral.  IMPRESSION: 1. Nasogastric tube to hiatal hernia. 2. Increase in bibasilar atelectasis/infiltrate, left greater than right, with possible left effusion.   Electronically Signed   By: Lucrezia Europe M.D.   On: 09/13/2015 09:44    Scheduled Meds: . enoxaparin (LOVENOX) injection  40 mg  Subcutaneous Q24H  . insulin aspart  0-9 Units Subcutaneous TID WC  . latanoprost  1 drop Both Eyes QHS  . levothyroxine  25 mcg Intravenous Daily  . metoprolol  2.5 mg Intravenous 4 times per day  . pantoprazole (PROTONIX) IV  40 mg Intravenous Q24H   Continuous Infusions: . dextrose 5 % and 0.9% NaCl 100 mL/hr at 09/14/15 0023    Principal Problem:   Nausea and vomiting Active Problems:   Coronary atherosclerosis of autologous vein bypass graft   Hyperlipidemia   Nephrolithiasis   Glaucoma   Preop cardiovascular exam   Small bowel obstruction (HCC)   Hypertensive urgency   Type II diabetes mellitus with ophthalmic manifestations, glaucoma   Colonic mass   Intestinal obstruction (HCC)   CAD (coronary artery disease)   Elevated troponin   Hemoptysis    Time spent: 25 min    [facility]  [facility] Pager [phone]*. If 7PM-7AM, please contact night-coverage at [email or web address],  password [email or web address] 09/14/2015, 12:37 PM  LOS: 5 days

## 2015-09-14 NOTE — Care Management Important Message (Signed)
Important Message  Patient Details  Name: Kenneth [name] MRN: [id number] Date of Birth: 1928-11-17   Medicare Important Message Given:  Yes-third notification given    Erenest Rasher, RN 09/14/2015, 5:07 PM

## 2015-09-15 LAB — GLUCOSE, CAPILLARY
GLUCOSE-CAPILLARY: 94 mg/dL (ref 65–99)
GLUCOSE-CAPILLARY: 140 mg/dL — AB (ref 65–99)
GLUCOSE-CAPILLARY: 122 mg/dL — AB (ref 65–99)
Glucose-Capillary: 163 mg/dL — ABNORMAL HIGH (ref 65–99)
Glucose-Capillary: 159 mg/dL — ABNORMAL HIGH (ref 65–99)

## 2015-09-15 MED ORDER — FUROSEMIDE 10 MG/ML IJ SOLN
20.0000 mg | Freq: Two times a day (BID) | INTRAMUSCULAR | Status: AC
  Administered 2015-09-15 (×2): 20 mg via INTRAVENOUS
  Filled 2015-09-15 (×2): qty 2

## 2015-09-15 NOTE — Progress Notes (Signed)
    SUBJECTIVE:  No chest pain.  Some dyspnea.     PHYSICAL EXAM Filed Vitals:   09/14/15 1835 09/14/15 2213 09/15/15 0129 09/15/15 0502  BP: 178/70 177/70 187/71 155/68  Pulse: 76 82 68 82  Temp:  98.4 F (36.9 C) 98.5 F (36.9 C) 98.4 F (36.9 C)  TempSrc:  Oral Oral Oral  Resp:  19 19 18   Height:      Weight:      SpO2: 95% 97% 97% 97%   General:  No acute distress Lungs:  Clear Heart:  RRR Abdomen:  Positive bowel sounds, no rebound no guarding, decreased bowel sounds to some degree Extremities:  No edema  LABS: Lab Results  Component Value Date   TROPONINI 0.07* 09/11/2015   Results for orders placed or performed during the hospital encounter of 09/09/15 (from the past 24 hour(s))  Glucose, capillary     Status: Abnormal   Collection Time: 09/14/15  5:14 PM  Result Value Ref Range   Glucose-Capillary 153 (H) 65 - 99 mg/dL  Glucose, capillary     Status: Abnormal   Collection Time: 09/14/15  8:11 PM  Result Value Ref Range   Glucose-Capillary 117 (H) 65 - 99 mg/dL  Glucose, capillary     Status: Abnormal   Collection Time: 09/14/15 11:48 PM  Result Value Ref Range   Glucose-Capillary 142 (H) 65 - 99 mg/dL  Glucose, capillary     Status: Abnormal   Collection Time: 09/15/15  5:01 AM  Result Value Ref Range   Glucose-Capillary 163 (H) 65 - 99 mg/dL  Glucose, capillary     Status: Abnormal   Collection Time: 09/15/15  7:33 AM  Result Value Ref Range   Glucose-Capillary 159 (H) 65 - 99 mg/dL  Glucose, capillary     Status: None   Collection Time: 09/15/15 11:52 AM  Result Value Ref Range   Glucose-Capillary 94 65 - 99 mg/dL    Intake/Output Summary (Last 24 hours) at 09/15/15 1200 Last data filed at 09/15/15 0925  Gross per 24 hour  Intake   3430 ml  Output   2200 ml  Net   1230 ml     ASSESSMENT AND PLAN:  CAD:  Elevated troponin.  He is not having any acute cardiac symptoms.   Still not taking pills.  Resume beta blocker when taking POs.     Jeneen Rinks Azreal Stthomas 09/15/2015 12:00 PM

## 2015-09-15 NOTE — Progress Notes (Signed)
Patient ID: Kenneth [name], male   DOB: 1928/09/21, 81 y.o.   MRN: [id number] 4 Days Post-Op  Subjective: Pt feels nauseated today.  Sipping on his clear liquids.  Feels weak and tired.  Objective: Vital signs in last 24 hours: Temp:  [98.4 F (36.9 C)-98.5 F (36.9 C)] 98.4 F (36.9 C) (10/10 0502) Pulse Rate:  [68-82] 82 (10/10 0502) Resp:  [18-19] 18 (10/10 0502) BP: (116-187)/(58-87) 155/68 mmHg (10/10 0502) SpO2:  [94 %-97 %] 97 % (10/10 0502) Last BM Date: 09/07/15  Intake/Output from previous day: 10/09 0701 - 10/10 0700 In: 3370 [P.O.:270; I.V.:3100] Out: 2200 [Urine:2125; Stool:75] Intake/Output this shift: Total I/O In: 48 [P.O.:60] Out: -   PE: Abd: soft, +BS, minimal bloating, ostomy is starting to work with some feculent drainage, midline incision is c/d/i with staples  Lab Results:   Recent Labs  09/13/15 0420  WBC 12.6*  HGB 11.3*  HCT 35.0*  PLT 300   BMET  Recent Labs  09/13/15 0420  NA 139  K 3.5  CL 107  CO2 27  GLUCOSE 169*  BUN 11  CREATININE 0.62  CALCIUM 8.2*   PT/INR No results for input(s): LABPROT, INR in the last 72 hours. CMP     Component Value Date/Time   NA 139 09/13/2015 0420   K 3.5 09/13/2015 0420   CL 107 09/13/2015 0420   CO2 27 09/13/2015 0420   GLUCOSE 169* 09/13/2015 0420   BUN 11 09/13/2015 0420   CREATININE 0.62 09/13/2015 0420   CALCIUM 8.2* 09/13/2015 0420   PROT 5.9* 09/11/2015 0526   ALBUMIN 2.9* 09/11/2015 0526   AST 15 09/11/2015 0526   ALT 10* 09/11/2015 0526   ALKPHOS 59 09/11/2015 0526   BILITOT 0.6 09/11/2015 0526   GFRNONAA >60 09/13/2015 0420   GFRAA >60 09/13/2015 0420   Lipase     Component Value Date/Time   LIPASE 19* 09/09/2015 1020       Studies/Results: No results found.  Anti-infectives: Anti-infectives    Start     Dose/Rate Route Frequency Ordered Stop   09/11/15 0600  clindamycin (CLEOCIN) IVPB 900 mg     900 mg 100 mL/hr over 30 Minutes Intravenous On call to  O.R. 09/10/15 1623 09/11/15 1108   09/11/15 0600  gentamicin (GARAMYCIN) IVPB 100 mg  Status:  Discontinued     100 mg 200 mL/hr over 30 Minutes Intravenous On call to O.R. 09/10/15 1623 09/10/15 1631   09/11/15 0600  gentamicin (GARAMYCIN) 350 mg in dextrose 5 % 100 mL IVPB     350 mg 217.5 mL/hr over 30 Minutes Intravenous On call to O.R. 09/10/15 1630 09/11/15 1150       Assessment/Plan  POD 4, s/p sigmoid colectomy, colostomy for obstructing colon mass -leave on clears for now due to nausea.  Would hold off on advancement -WOC for new ostomy care -mobilize and pulm toilet today -PT eval and treat -await path, still pending -foley was DC on Friday, replaced for urinary retention.  Will defer to medicine when to do voiding trial -PT recommended SNF, but patient is not wanting to go to SNF.  His wife is incredibly forgetful and I feel this would not be safe for him to go home, but patient of sound mind.  Will defer this to medicine service.  DVT prophylaxis -SCDs/Lovenox  LOS: 6 days    Kenneth [name] 09/15/2015, 10:03 AM Pager: [phone]

## 2015-09-15 NOTE — Consult Note (Addendum)
WOC ostomy follow-up consult note Stoma type/location: LLQ Colostomy Stomal assessment/size: 1 3/4" oval pink moist and patent, above skin level.  Peristomal assessment: Intact. Creasing at 10 and 4 o'clock, will add barrier ring and 1 piece flat pouch. Midline abdominal dressing (Honeycomb) dressing in place.  Treatment options for stomal/peristomal skin: Barrier ring to improve seal  Output Mod amt liquid brown stool in pouch Ostomy pouching: 1pc. flat with barrier ring. 3 additional pouches and rings left in the basin in the room for staff nurse use.  Education provided: Patient watched pouch application and asked appropriate questions.  He is able to open and close the Velcro to empty with a large amt assistance. No family present during pouching session, but patient states they will be performing his pouch changes for him.  Recommend home health assistance after discharge.  Enrolled patient in [place] program: Yes; discussed ordering pouching supplies but patient did not appear to understand. [place] team will continue to follow patient for ostomy teaching with patient and family.  Julien Girt MSN, RN, [place], [place], [place]

## 2015-09-15 NOTE — Progress Notes (Signed)
Physical Therapy Treatment Patient Details Name: Kenneth [name] MRN: [id number] DOB: June 22, 1928 Today's Date: 09/15/2015    History of Present Illness EXPLORATORY LAPAROTOMY, SIGMOID COLECTOMY AND COLOSTOMY - 09/11/2015     PT Comments    Pt progressing well with mobility. Demonstrated and instructed pt how to "log roll" to assist self from supine to EOB to minimize ABD pain/stress.  Pt c/o mod dizziness so allowed increased time to sit EOB.  BP 142/88, HR 86 and RA 97%.  Assisted with amb a great distance in hallway using a RW.  Pt c/o 6/10 ABD pain/tightness.  Returned to room then positioned in recliner.    Follow Up Recommendations  Home health PT (per chart review, family plans to have pt return home with support)     Equipment Recommendations  Rolling walker with 5" wheels    Recommendations for Other Services       Precautions / Restrictions Precautions Precautions: Fall Precaution Comments: colostomy Restrictions Weight Bearing Restrictions: No    Mobility  Bed Mobility Overal bed mobility: Needs Assistance Bed Mobility: Rolling;Sidelying to Sit Rolling: Min guard Sidelying to sit: Min assist;Mod assist       General bed mobility comments: demonstrated and instructed on "log roll" tech to minimized ABD stress and decrease pain.   Transfers Overall transfer level: Needs assistance Equipment used: Rolling walker (2 wheeled) Transfers: Sit to/from Stand Sit to Stand: Min guard;+2 safety/equipment         General transfer comment: 25% VC's on proprt tech and hand placement esp with stand to sit  Ambulation/Gait Ambulation/Gait assistance: Min guard Ambulation Distance (Feet): 145 Feet Assistive device: Rolling walker (2 wheeled) Gait Pattern/deviations: Step-through pattern;Decreased stride length Gait velocity: decreased   General Gait Details: <25% VC's on proper use RW esp with turns and safety.     Stairs            Wheelchair Mobility     Modified Rankin (Stroke Patients Only)       Balance                                    Cognition Arousal/Alertness: Awake/alert Behavior During Therapy: WFL for tasks assessed/performed Overall Cognitive Status: Within Functional Limits for tasks assessed                      Exercises      General Comments        Pertinent Vitals/Pain Pain Assessment: 0-10 Pain Score: 6  Pain Location: ABD Pain Descriptors / Indicators: Cramping;Sore;Tightness Pain Intervention(s): Monitored during session;Repositioned;Patient requesting pain meds-RN notified    Home Living                      Prior Function            PT Goals (current goals can now be found in the care plan section) Progress towards PT goals: Progressing toward goals    Frequency  Min 3X/week    PT Plan Current plan remains appropriate    Co-evaluation             End of Session Equipment Utilized During Treatment: Gait belt Activity Tolerance: Patient tolerated treatment well Patient left: in chair;with call bell/phone within reach;with chair alarm set;with family/visitor present     Time: 1115-1135 PT Time Calculation (min) (ACUTE ONLY): 20 min  Charges:  $Gait Training: 8-22  mins                    G Codes:      Rica Koyanagi  PTA [facility]  Acute  Rehab Pager      [phone]

## 2015-09-15 NOTE — Progress Notes (Signed)
[facility] PROGRESS NOTE  Kenneth [name] DPO:[id number] DOB: 1928/06/06 DOA: 09/09/2015 PCP: Irven Shelling, MD  Assessment/Plan: 1. Elevated troponin-  improving, repeat troponin is 0.07. Blood pressure is well controlled. Patient denies any chest pain ,was seen by cardiology and no further intervention recommended.  2. Hypertension- patient is currently nothing by mouth, will start the patient on IV metoprolol 2.5 mg every 6 hours. continue when necessary hydralazine  3. Pulmonary edema- patient up in mild fluid overload. Will cut down the fluids to [facility]. Give 2 doses of Lasix 20 g IV every 12 hours. Check BMP in a.m. Strict I's and O's. 4. Hemoptysis-resolved,  patient had episode of hemoptysis last night which could also be due to NG tube trauma, chest x-ray shows bibasilar atelectasis with left pleural effusion. We'll keep a close watch on IV fluids as patient may develop fluid overload. NG tube discontinued. 5.  Diabetes mellitus-patient is currently nothing by mouth, continue sliding scale insulin with NovoLog 6. S/p sigmoid colectomy, colostomy for obstructing colon mass- patient was evaluated by GI, and  Found not a candidate for colonic stent given the large size of the mass. Underwent  Sigmoid colectomy. 7. ? Suicidal ideation- patient said that he wanted somebody to get a firearm to care for himself clinical social worker, later said that he does not have any plan or thoughts of killing himself. He only said that so the clinical social worker can leave the room.. Patient did not want to go to skilled nursing facility. 8. DVT prophylaxis- Lovenox  Code Status: Full code Family Communication: *Discussed with family members at bedside Disposition Plan:  skilled nursing facility at the time of discharge   Consultants:  Surgery  Gastroenterology  Procedures:  None  Antibiotics:  None  HPI/Subjective: 81 year old male who came to the hospital with complaint of  several months duration of progressively worsening left lower quadrant abdominal pain the pain has been getting worse and now 10/10 in intensity. In the ED imaging study showed colonic mass with question small bowel obstruction. General surgery has been consulted, and they recommended gastroenterology to see the patient for possible colonic  Stenting. Patient seen and examined, no complaints today. CXR shows bibasilar atelectasis. This morning patient was seen by clinical social worker for facility placement, and that time he mentioned that He wanted someone to bring him of firearms we could kill himself. When I asked the patient about suicide ideation and thoughts, patient says that he was joking as he did not want to go to skilled facility and he wanted the clinical social worker to go out of the room.  This was confirmed with RN at bedside, patient denies suicidal ideation or plan at this time.  Patient complains of mild shortness of breath this morning  Ob jective: Filed Vitals:   09/15/15 1300  BP: 139/66  Pulse: 79  Temp: 98.4 F (36.9 C)  Resp: 16    Intake/Output Summary (Last 24 hours) at 09/15/15 1756 Last data filed at 09/15/15 1344  Gross per 24 hour  Intake   2000 ml  Output   2375 ml  Net   -375 ml   Filed Weights   09/09/15 1507  Weight: 70.5 kg (155 lb 6.8 oz)    Exam:   General:  Appears in no acute distress, NG tube in place  Cardiovascular: S1-S2 regular  Respiratory: Bibasilar crackles  Abdomen: Soft, mild tenderness in the left lower quadrant, no rigidity, no guarding ,no organomegaly  Musculoskeletal: No  edema/cyanosis/clubbing noted in the lower extremities   Data Reviewed: Basic Metabolic Panel:  Recent Labs Lab 09/09/15 1020 09/10/15 0710 09/11/15 0526 09/12/15 0548 09/13/15 0420  NA 138 137 140 135 139  K 3.7 3.6 3.7 3.7 3.5  CL 105 105 107 104 107  CO2 27 24 24 25 27   GLUCOSE 144* 121* 116* 207* 169*  BUN 20 17 18 11 11    CREATININE 0.74 0.66 0.75 0.64 0.62  CALCIUM 9.1 8.5* 8.5* 8.4* 8.2*   Liver Function Tests:  Recent Labs Lab 09/09/15 1020 09/11/15 0526  AST 21 15  ALT 14* 10*  ALKPHOS 73 59  BILITOT 0.5 0.6  PROT 7.0 5.9*  ALBUMIN 3.5 2.9*    Recent Labs Lab 09/09/15 1020  LIPASE 19*   No results for input(s): AMMONIA in the last 168 hours. CBC:  Recent Labs Lab 09/09/15 1020 09/10/15 0710 09/12/15 0548 09/13/15 0420  WBC 9.5 8.2 15.5* 12.6*  HGB 13.1 11.7* 13.0 11.3*  HCT 39.9 35.7* 39.8 35.0*  MCV 94.3 95.2 94.1 96.2  PLT 341 339 345 300   Cardiac Enzymes:  Recent Labs Lab 09/10/15 0710 09/10/15 1247 09/10/15 1921 09/10/15 2358 09/11/15 0526  TROPONINI 0.14* 0.15* 0.12* 0.07* 0.07*    CBG:  Recent Labs Lab 09/14/15 2348 09/15/15 0501 09/15/15 0733 09/15/15 1152 09/15/15 1640  GLUCAP 142* 163* 159* 94 140*    Recent Results (from the past 240 hour(s))  Surgical pcr screen     Status: None   Collection Time: 09/11/15  3:55 AM  Result Value Ref Range Status   MRSA, PCR NEGATIVE NEGATIVE Final   Staphylococcus aureus NEGATIVE NEGATIVE Final    Comment:        The Xpert SA Assay (FDA approved for NASAL specimens in patients over 46 years of age), is one component of a comprehensive surveillance program.  Test performance has been validated by [facility] for patients greater than or equal to 31 year old. It is not intended to diagnose infection nor to guide or monitor treatment.      Studies: No results found.  Scheduled Meds: . enoxaparin (LOVENOX) injection  40 mg Subcutaneous Q24H  . furosemide  20 mg Intravenous Q12H  . insulin aspart  0-9 Units Subcutaneous TID WC  . latanoprost  1 drop Both Eyes QHS  . levothyroxine  25 mcg Intravenous Daily  . metoprolol  2.5 mg Intravenous 4 times per day  . pantoprazole (PROTONIX) IV  40 mg Intravenous Q24H   Continuous Infusions: . dextrose 5 % and 0.9% NaCl 10 mL/hr (09/15/15 1057)     Principal Problem:   Nausea and vomiting Active Problems:   Coronary atherosclerosis of autologous vein bypass graft   Hyperlipidemia   Nephrolithiasis   Glaucoma   Preop cardiovascular exam   Small bowel obstruction (HCC)   Hypertensive urgency   Type II diabetes mellitus with ophthalmic manifestations, glaucoma   Colonic mass   Intestinal obstruction (HCC)   CAD (coronary artery disease)   Elevated troponin   Hemoptysis    Time spent: 25 min    [facility]  [facility] Pager [phone]*. If 7PM-7AM, please contact night-coverage at [email or web address], password [facility] 09/15/2015, 5:56 PM  LOS: 6 days

## 2015-09-16 LAB — GLUCOSE, CAPILLARY
GLUCOSE-CAPILLARY: 112 mg/dL — AB (ref 65–99)
GLUCOSE-CAPILLARY: 115 mg/dL — AB (ref 65–99)
Glucose-Capillary: 115 mg/dL — ABNORMAL HIGH (ref 65–99)
Glucose-Capillary: 177 mg/dL — ABNORMAL HIGH (ref 65–99)
Glucose-Capillary: 106 mg/dL — ABNORMAL HIGH (ref 65–99)
Glucose-Capillary: 112 mg/dL — ABNORMAL HIGH (ref 65–99)

## 2015-09-16 LAB — BASIC METABOLIC PANEL
Anion gap: 6 (ref 5–15)
BUN: 10 mg/dL (ref 6–20)
CALCIUM: 8.2 mg/dL — AB (ref 8.9–10.3)
CHLORIDE: 104 mmol/L (ref 101–111)
CO2: 28 mmol/L (ref 22–32)
CREATININE: 0.62 mg/dL (ref 0.61–1.24)
Glucose, Bld: 119 mg/dL — ABNORMAL HIGH (ref 65–99)
Potassium: 3.1 mmol/L — ABNORMAL LOW (ref 3.5–5.1)
SODIUM: 138 mmol/L (ref 135–145)

## 2015-09-16 MED ORDER — METOPROLOL SUCCINATE ER 50 MG PO TB24: 50.0000 mg | ORAL_TABLET | Freq: Every day | ORAL | Status: DC

## 2015-09-16 MED ORDER — PANTOPRAZOLE SODIUM 40 MG PO TBEC
40.0000 mg | DELAYED_RELEASE_TABLET | Freq: Every day | ORAL | Status: DC
  Administered 2015-09-16 – 2015-09-18 (×3): 40 mg via ORAL
  Filled 2015-09-16 (×3): qty 1

## 2015-09-16 MED ORDER — LEVOTHYROXINE SODIUM 25 MCG PO TABS
50.0000 ug | ORAL_TABLET | Freq: Every day | ORAL | Status: DC
Start: 2015-09-16 — End: 2015-09-18
  Administered 2015-09-16 – 2015-09-18 (×3): 50 ug via ORAL
  Filled 2015-09-16 (×3): qty 2

## 2015-09-16 MED ORDER — FUROSEMIDE 10 MG/ML IJ SOLN
20.0000 mg | Freq: Two times a day (BID) | INTRAMUSCULAR | Status: AC
  Administered 2015-09-16 (×2): 20 mg via INTRAVENOUS
  Filled 2015-09-16 (×2): qty 2

## 2015-09-16 MED ORDER — POTASSIUM CHLORIDE 10 MEQ/100ML IV SOLN
10.0000 meq | INTRAVENOUS | Status: AC
  Administered 2015-09-16 (×3): 10 meq via INTRAVENOUS
  Filled 2015-09-16: qty 100

## 2015-09-16 MED ORDER — SIMVASTATIN 20 MG PO TABS
20.0000 mg | ORAL_TABLET | Freq: Every day | ORAL | Status: DC
  Administered 2015-09-16 – 2015-09-17 (×2): 20 mg via ORAL
  Filled 2015-09-16 (×2): qty 1

## 2015-09-16 MED ORDER — METOPROLOL SUCCINATE ER 25 MG PO TB24
25.0000 mg | ORAL_TABLET | Freq: Every day | ORAL | Status: DC
  Administered 2015-09-16 – 2015-09-18 (×3): 25 mg via ORAL
  Filled 2015-09-16 (×3): qty 1

## 2015-09-16 NOTE — Progress Notes (Signed)
[facility] PROGRESS NOTE  CLARANCE BOLLARD TZG:[id number] DOB: 02/09/1928 DOA: 09/09/2015 PCP: Irven Shelling, MD  Assessment/Plan: 1. Elevated troponin-  improving, repeat troponin is 0.07. Blood pressure is well controlled. Patient denies any chest pain ,was seen by cardiology and no further intervention recommended.  2. Hypertension- patient is currently nothing by mouth, was started on  IV metoprolol 2.5 mg every 6 hours. continue when necessary hydralazine. Toprol-XL by mouth has been restarted by cardiology 3. Pulmonary edema- patient  in mild fluid overload. Was given Lasix yesterday and diuresed 1700 mL of urine. Will give 2 more doses of Lasix and check BMP in a.m. 4. Hypokalemia- today potassium is 3.1 secondary to diuresis, replace potassium and check BMP in a.m. 5. Hemoptysis-resolved,  patient had episode of hemoptysis last night which could also be due to NG tube trauma, chest x-ray shows bibasilar atelectasis with left pleural effusion. We'll keep a close watch on IV fluids as patient may develop fluid overload. NG tube discontinued. 6.  Diabetes mellitus-continue sliding scale insulin with NovoLog 7. S/p sigmoid colectomy, colostomy for obstructing colon mass- patient was evaluated by GI, and  Found not a candidate for colonic stent given the large size of the mass. Underwent  Sigmoid colectomy. 8. ? Suicidal ideation- patient said that he wanted somebody to get a firearm to care for himself clinical social worker, later said that he does not have any plan or thoughts of killing himself. He only said that so the clinical social worker can leave the room.. Patient did not want to go to skilled nursing facility. 9. DVT prophylaxis- Lovenox  Code Status: Full code Family Communication: *Discussed with family members at bedside Disposition Plan:  skilled nursing facility at the time of  discharge   Consultants:  Surgery  Gastroenterology  Procedures:  None  Antibiotics:  None  HPI/Subjective: 81 year old male who came to the hospital with complaint of several months duration of progressively worsening left lower quadrant abdominal pain the pain has been getting worse and now 10/10 in intensity. In the ED imaging study showed colonic mass with question small bowel obstruction. General surgery has been consulted, and they recommended gastroenterology to see the patient for possible colonic  Stenting. Patient seen and examined, no complaints today. CXR shows bibasilar atelectasis. This morning patient was seen by clinical social worker for facility placement, and that time he mentioned that He wanted someone to bring him of firearms we could kill himself. When I asked the patient about suicide ideation and thoughts, patient says that he was joking as he did not want to go to skilled facility and he wanted the clinical social worker to go out of the room.  This was confirmed with RN at bedside, patient denies suicidal ideation or plan at this time.  Patient's breathing has improved. Good diuresis with Lasix.  Ob jective: Filed Vitals:   09/15/15 2107  BP: 140/65  Pulse: 76  Temp: 98.3 F (36.8 C)  Resp: 18    Intake/Output Summary (Last 24 hours) at 09/16/15 1407 Last data filed at 09/15/15 2315  Gross per 24 hour  Intake      0 ml  Output    900 ml  Net   -900 ml   Filed Weights   09/09/15 1507  Weight: 70.5 kg (155 lb 6.8 oz)    Exam:   General:  Appears in no acute distress, NG tube in place  Cardiovascular: S1-S2 regular  Respiratory: Decreased breath sounds at lung bases  Abdomen: Soft, mild tenderness in the left lower quadrant, no rigidity, no guarding ,no organomegaly  Musculoskeletal: No edema/cyanosis/clubbing noted in the lower extremities   Data Reviewed: Basic Metabolic Panel:  Recent Labs Lab 09/10/15 0710 09/11/15 0526  09/12/15 0548 09/13/15 0420 09/16/15 0430  NA 137 140 135 139 138  K 3.6 3.7 3.7 3.5 3.1*  CL 105 107 104 107 104  CO2 24 24 25 27 28   GLUCOSE 121* 116* 207* 169* 119*  BUN 17 18 11 11 10   CREATININE 0.66 0.75 0.64 0.62 0.62  CALCIUM 8.5* 8.5* 8.4* 8.2* 8.2*   Liver Function Tests:  Recent Labs Lab 09/11/15 0526  AST 15  ALT 10*  ALKPHOS 59  BILITOT 0.6  PROT 5.9*  ALBUMIN 2.9*   No results for input(s): LIPASE, AMYLASE in the last 168 hours. No results for input(s): AMMONIA in the last 168 hours. CBC:  Recent Labs Lab 09/10/15 0710 09/12/15 0548 09/13/15 0420  WBC 8.2 15.5* 12.6*  HGB 11.7* 13.0 11.3*  HCT 35.7* 39.8 35.0*  MCV 95.2 94.1 96.2  PLT 339 345 300   Cardiac Enzymes:  Recent Labs Lab 09/10/15 0710 09/10/15 1247 09/10/15 1921 09/10/15 2358 09/11/15 0526  TROPONINI 0.14* 0.15* 0.12* 0.07* 0.07*    CBG:  Recent Labs Lab 09/15/15 2001 09/16/15 0003 09/16/15 0419 09/16/15 0743 09/16/15 1139  GLUCAP 122* 115* 112* 115* 177*    Recent Results (from the past 240 hour(s))  Surgical pcr screen     Status: None   Collection Time: 09/11/15  3:55 AM  Result Value Ref Range Status   MRSA, PCR NEGATIVE NEGATIVE Final   Staphylococcus aureus NEGATIVE NEGATIVE Final    Comment:        The Xpert SA Assay (FDA approved for NASAL specimens in patients over 77 years of age), is one component of a comprehensive surveillance program.  Test performance has been validated by [facility] for patients greater than or equal to 75 year old. It is not intended to diagnose infection nor to guide or monitor treatment.      Studies: No results found.  Scheduled Meds: . enoxaparin (LOVENOX) injection  40 mg Subcutaneous Q24H  . furosemide  20 mg Intravenous BID  . insulin aspart  0-9 Units Subcutaneous TID WC  . latanoprost  1 drop Both Eyes QHS  . levothyroxine  50 mcg Oral QAC breakfast  . metoprolol succinate  25 mg Oral Daily  .  pantoprazole  40 mg Oral Daily  . simvastatin  20 mg Oral q1800   Continuous Infusions: . dextrose 5 % and 0.9% NaCl 10 mL/hr (09/15/15 1057)    Principal Problem:   Nausea and vomiting Active Problems:   Coronary atherosclerosis of autologous vein bypass graft   Hyperlipidemia   Nephrolithiasis   Glaucoma   Preop cardiovascular exam   Small bowel obstruction (HCC)   Hypertensive urgency   Type II diabetes mellitus with ophthalmic manifestations, glaucoma   Colonic mass   Intestinal obstruction (HCC)   CAD (coronary artery disease)   Elevated troponin   Hemoptysis    Time spent: 25 min    [facility]  [facility] Pager [phone]*. If 7PM-7AM, please contact night-coverage at [email or web address], password [facility] 09/16/2015, 2:07 PM  LOS: 7 days

## 2015-09-16 NOTE — Progress Notes (Signed)
Patient ID: Kenneth [name], male   DOB: 1928-01-02, 81 y.o.   MRN: [id number] 5 Days Post-Op  Subjective: Pt feels much better today.  Tolerating his clear liquids well.  No nausea  Objective: Vital signs in last 24 hours: Temp:  [98.3 F (36.8 C)-98.4 F (36.9 C)] 98.3 F (36.8 C) (10/10 2107) Pulse Rate:  [76-79] 76 (10/10 2107) Resp:  [16-18] 18 (10/10 2107) BP: (139-140)/(65-66) 140/65 mmHg (10/10 2107) SpO2:  [98 %] 98 % (10/10 2107) Last BM Date: 09/15/15  Intake/Output from previous day: 10/10 0701 - 10/11 0700 In: 380 [P.O.:380] Out: 1800 [Urine:1700; Stool:100] Intake/Output this shift:    PE: Abd: soft, less tender, less distended, +BS, stoma is pink and viable and working well.  Lab Results:  No results for input(s): WBC, HGB, HCT, PLT in the last 72 hours. BMET  Recent Labs  09/16/15 0430  NA 138  K 3.1*  CL 104  CO2 28  GLUCOSE 119*  BUN 10  CREATININE 0.62  CALCIUM 8.2*   PT/INR No results for input(s): LABPROT, INR in the last 72 hours. CMP     Component Value Date/Time   NA 138 09/16/2015 0430   K 3.1* 09/16/2015 0430   CL 104 09/16/2015 0430   CO2 28 09/16/2015 0430   GLUCOSE 119* 09/16/2015 0430   BUN 10 09/16/2015 0430   CREATININE 0.62 09/16/2015 0430   CALCIUM 8.2* 09/16/2015 0430   PROT 5.9* 09/11/2015 0526   ALBUMIN 2.9* 09/11/2015 0526   AST 15 09/11/2015 0526   ALT 10* 09/11/2015 0526   ALKPHOS 59 09/11/2015 0526   BILITOT 0.6 09/11/2015 0526   GFRNONAA >60 09/16/2015 0430   GFRAA >60 09/16/2015 0430   Lipase     Component Value Date/Time   LIPASE 19* 09/09/2015 1020       Studies/Results: No results found.  Anti-infectives: Anti-infectives    Start     Dose/Rate Route Frequency Ordered Stop   09/11/15 0600  clindamycin (CLEOCIN) IVPB 900 mg     900 mg 100 mL/hr over 30 Minutes Intravenous On call to O.R. 09/10/15 1623 09/11/15 1108   09/11/15 0600  gentamicin (GARAMYCIN) IVPB 100 mg  Status:  Discontinued      100 mg 200 mL/hr over 30 Minutes Intravenous On call to O.R. 09/10/15 1623 09/10/15 1631   09/11/15 0600  gentamicin (GARAMYCIN) 350 mg in dextrose 5 % 100 mL IVPB     350 mg 217.5 mL/hr over 30 Minutes Intravenous On call to O.R. 09/10/15 1630 09/11/15 1150       Assessment/Plan  POD 5, s/p sigmoid colectomy, colostomy for obstructing colon mass -advance to full liquids -WOC for new ostomy care -mobilize and pulm toilet today -PT eval and treat -await path, still pending -foley was DC on Friday, replaced for urinary retention. Will defer to medicine when to do voiding trial -PT recommended SNF, but patient is not wanting to go to SNF.  [facility]y set up for routine ostomy care today DVT prophylaxis -SCDs/Lovenox   LOS: 7 days    [name] E 09/16/2015, 8:26 AM Pager: [phone]

## 2015-09-16 NOTE — Progress Notes (Signed)
Patient Name: Kenneth [name] Date of Encounter: 09/16/2015     Principal Problem:   Nausea and vomiting Active Problems:   Coronary atherosclerosis of autologous vein bypass graft   Hyperlipidemia   Nephrolithiasis   Glaucoma   Preop cardiovascular exam   Small bowel obstruction (HCC)   Hypertensive urgency   Type II diabetes mellitus with ophthalmic manifestations, glaucoma   Colonic mass   Intestinal obstruction (HCC)   CAD (coronary artery disease)   Elevated troponin   Hemoptysis    SUBJECTIVE  No CP, some mild dyspnea improved from yesterday. Doing incentive spirometry which helps.  No complaints- eating clears and tolerating this well. Diet to be advanced further today.   CURRENT MEDS . enoxaparin (LOVENOX) injection  40 mg Subcutaneous Q24H  . insulin aspart  0-9 Units Subcutaneous TID WC  . latanoprost  1 drop Both Eyes QHS  . levothyroxine  25 mcg Intravenous Daily  . metoprolol  2.5 mg Intravenous 4 times per day  . pantoprazole (PROTONIX) IV  40 mg Intravenous Q24H    OBJECTIVE  Filed Vitals:   09/15/15 0129 09/15/15 0502 09/15/15 1300 09/15/15 2107  BP: 187/71 155/68 139/66 140/65  Pulse: 68 82 79 76  Temp: 98.5 F (36.9 C) 98.4 F (36.9 C) 98.4 F (36.9 C) 98.3 F (36.8 C)  TempSrc: Oral Oral Oral Oral  Resp: 19 18 16 18   Height:      Weight:      SpO2: 97% 97% 98% 98%    Intake/Output Summary (Last 24 hours) at 09/16/15 0824 Last data filed at 09/15/15 2315  Gross per 24 hour  Intake    380 ml  Output   1800 ml  Net  -1420 ml   Filed Weights   09/09/15 1507  Weight: 155 lb 6.8 oz (70.5 kg)    PHYSICAL EXAM  General: Pleasant, NAD. Neuro: Alert and oriented X 3. Moves all extremities spontaneously. Psych: Normal affect. HEENT:  Normal  Neck: Supple without bruits or JVD. Lungs:  Resp regular and unlabored, CTA. Heart: RRR no s3, s4, or murmurs. Abdomen: Soft, non-tender, non-distended, BS + x 4.  Extremities: No clubbing,  cyanosis or edema. DP/PT/Radials 2+ and equal bilaterally.  Accessory Clinical Findings  CBC No results for input(s): WBC, NEUTROABS, HGB, HCT, MCV, PLT in the last 72 hours. Basic Metabolic Panel  Recent Labs  09/16/15 0430  NA 138  K 3.1*  CL 104  CO2 28  GLUCOSE 119*  BUN 10  CREATININE 0.62  CALCIUM 8.2*     TELE NSR with few PACs and PVCs  Radiology/Studies  Dg Chest 2 View  09/13/2015   CLINICAL DATA:  Hemoptysis, diabetes, hypertension  EXAM: CHEST - 2 VIEW  COMPARISON:  07/17/2013  FINDINGS: Previous CABG. Nasogastric tube into a hiatal hernia. Atelectasis/ infiltrate in the lung bases has increased, left worse than right. Heart size upper limits normal. Suspect small left pleural effusion as well. The right posterior costophrenic angle is excluded from the lateral.  IMPRESSION: 1. Nasogastric tube to hiatal hernia. 2. Increase in bibasilar atelectasis/infiltrate, left greater than right, with possible left effusion.   Electronically Signed   By: Lucrezia Europe M.D.   On: 09/13/2015 09:44   Ct Abdomen Pelvis W Contrast  09/09/2015   CLINICAL DATA:  Left lower quadrant pain for 2 weeks. Decreased bowel movements.  EXAM: CT ABDOMEN AND PELVIS WITH CONTRAST  TECHNIQUE: Multidetector CT imaging of the abdomen and pelvis was performed using  the standard protocol following bolus administration of intravenous contrast.  CONTRAST:  140mL OMNIPAQUE IOHEXOL 300 MG/ML  SOLN  COMPARISON:  08/22/2014  FINDINGS: Stable small calcified granuloma at the left lung base. No evidence pleural effusions.  No evidence for free intraperitoneal air. Again noted is a large hiatal hernia containing most of the stomach. There is no evidence for gastric obstruction or inflammatory changes. Please note that the entire stomach is not visualized. Again noted are two low-density structures in right hepatic lobe, largest measuring 2.4 cm. Findings are most compatible with hepatic cysts. Otherwise, normal  appearance the liver and the portal venous system is patent. Normal appearance of the gallbladder. There is a normal appearance of spleen. A small amount of fluid in the left upper quadrant lateral to the spleen on sequence 2, image 16. Multiple calcifications near the pancreatic head suggestive for old pancreas inflammation. Stable fullness of the right adrenal gland is unchanged. Normal appearance of the left adrenal gland. There is an 8 mm nonobstructive stone in the right kidney lower pole. Normal appearance of the left kidney.  Atherosclerotic calcifications involving the aorta and iliac arteries without aneurysm.  No gross abnormality to the urinary bladder or prostate. Small-moderate amount of fluid along the right side of the pelvis on sequence 2, image 65. Again noted is extensive wall thickening involving the sigmoid colon with diverticula. This abnormal wall thickening in the sigmoid colon involves a 10 cm segment. The descending colon is moderately distended with stool. There is gas and stool throughout the transverse colon and right colon. Mild inflammatory changes around the descending colon, best seen on sequence 2, image 32. In addition, there are prominent lymph nodes in the sigmoid mesocolon and along the drainage of the sigmoid colon, best seen on sequence 2, image 50.  No significant dilatation of small bowel loops. Previously, there was a small hernia in the left lower abdomen which has either been repaired or less conspicuous.  No acute bone abnormality. Disc space and endplates changes I4-P8 and L5-S1.  IMPRESSION: Abnormal sigmoid colon with diffuse wall thickening and concern for a colonic mass. The colon proximal to this abnormal sigmoid colon is distended with a large amount of stool and pericolonic inflammatory changes. There is a small amount of fluid in left upper quadrant and small to moderate amount of fluid in the pelvis. In addition, there are multiple lymph nodes throughout the  sigmoid mesocolon region. Findings are highly concerning for a neoplastic process in the sigmoid colon causing at least partial obstruction and inflammatory changes in the descending colon.  Large hiatal hernia containing most of the stomach.  Nonobstructive right kidney stone.  These results were called by telephone at the time of interpretation on 09/09/2015 at 12:42 pm to Dr. Carmin Muskrat , who verbally acknowledged these results.   Electronically Signed   By: Markus Daft M.D.   On: 09/09/2015 12:46    ASSESSMENT AND PLAN  81 y.o. With a hx of CAD s/p CABG (1993), HLD, HTN and DM who presented with LLQ pain for 5-6 months and found to have a colonic obstruction secondary to neoplasm. Cardiology was consulted for pre-op clearance.   CAD - history of prior CABG.  - mild trop elevation, suspect demand ischemia. Peak of 0.15. No cardiac symptoms - he has ASA allergy. He is now taking orals and tolerating clear liquids. Diet advanced further today. I have restarted his home statin and Toprol XL 25mg  today  Abdominal mass - s/p  laparaotomy and sigmoid colectomy 09/11/15 and doing well  Signed, Eileen Stanford PA-C  Pager [phone]   History and all data above reviewed.  Patient examined.  I agree with the findings as above.  No chest pain.  No SOB.  Eating better today.  Taking PO meds The patient exam reveals COR:RRR  ,  Lungs: Clear  ,  Abd: Positive bowel sounds, no rebound no guarding, Ext No edema  .  All available labs, radiology testing, previous records reviewed. Agree with documented assessment and plan. Elevated troponin:  Probably stress mediated.  Plan medical management.  Beta blocker and statin restarted.    Jeneen Rinks Lashica Hannay  12:20 PM  09/16/2015

## 2015-09-17 DIAGNOSIS — K5649 Other impaction of intestine: Secondary | ICD-10-CM

## 2015-09-17 DIAGNOSIS — R042 Hemoptysis: Secondary | ICD-10-CM

## 2015-09-17 LAB — GLUCOSE, CAPILLARY
GLUCOSE-CAPILLARY: 172 mg/dL — AB (ref 65–99)
GLUCOSE-CAPILLARY: 104 mg/dL — AB (ref 65–99)
GLUCOSE-CAPILLARY: 146 mg/dL — AB (ref 65–99)
Glucose-Capillary: 108 mg/dL — ABNORMAL HIGH (ref 65–99)

## 2015-09-17 LAB — BASIC METABOLIC PANEL
Anion gap: 8 (ref 5–15)
BUN: 14 mg/dL (ref 6–20)
CALCIUM: 8.5 mg/dL — AB (ref 8.9–10.3)
CO2: 29 mmol/L (ref 22–32)
CREATININE: 0.65 mg/dL (ref 0.61–1.24)
Chloride: 100 mmol/L — ABNORMAL LOW (ref 101–111)
Glucose, Bld: 124 mg/dL — ABNORMAL HIGH (ref 65–99)
Potassium: 3.4 mmol/L — ABNORMAL LOW (ref 3.5–5.1)
SODIUM: 137 mmol/L (ref 135–145)

## 2015-09-17 MED ORDER — HYDROCHLOROTHIAZIDE 12.5 MG PO CAPS
12.5000 mg | ORAL_CAPSULE | Freq: Every day | ORAL | Status: DC
  Administered 2015-09-17: 12.5 mg via ORAL
  Filled 2015-09-17: qty 1

## 2015-09-17 MED ORDER — DIPHENHYDRAMINE HCL 12.5 MG/5ML PO ELIX
12.5000 mg | ORAL_SOLUTION | Freq: Once | ORAL | Status: AC
  Administered 2015-09-17: 12.5 mg via ORAL
  Filled 2015-09-17: qty 5

## 2015-09-17 MED ORDER — POTASSIUM CHLORIDE CRYS ER 20 MEQ PO TBCR
20.0000 meq | EXTENDED_RELEASE_TABLET | Freq: Two times a day (BID) | ORAL | Status: DC
  Administered 2015-09-17 – 2015-09-18 (×3): 20 meq via ORAL
  Filled 2015-09-17 (×3): qty 1

## 2015-09-17 MED ORDER — CHLORTHALIDONE 25 MG PO TABS
12.5000 mg | ORAL_TABLET | Freq: Every day | ORAL | Status: DC
  Administered 2015-09-17 – 2015-09-18 (×2): 12.5 mg via ORAL
  Filled 2015-09-17 (×2): qty 0.5

## 2015-09-17 NOTE — Progress Notes (Signed)
Physical Therapy Treatment Patient Details Name: YOVAN LEEMAN MRN: [id number] DOB: Apr 22, 1928 Today's Date: 09/17/2015    History of Present Illness EXPLORATORY LAPAROTOMY, SIGMOID COLECTOMY AND COLOSTOMY - 09/11/2015     PT Comments    Pt progressing well with his mobility.  Tolerated increased amb distance.  Follow Up Recommendations  Home health PT     Equipment Recommendations  Rolling walker with 5" wheels    Recommendations for Other Services       Precautions / Restrictions Precautions Precautions: Fall Precaution Comments: colostomy Restrictions Weight Bearing Restrictions: No    Mobility  Bed Mobility Overal bed mobility: Needs Assistance Bed Mobility: Supine to Sit     Supine to sit: Min guard;Min assist     General bed mobility comments: increased time with less c/o ABD pain and increased ability to self perform  Transfers Overall transfer level: Needs assistance Equipment used: Rolling walker (2 wheeled) Transfers: Sit to/from Stand Sit to Stand: Supervision;Min guard         General transfer comment: good safety cognition and use of hands  Ambulation/Gait Ambulation/Gait assistance: Supervision;Min guard Ambulation Distance (Feet): 350 Feet Assistive device: Rolling walker (2 wheeled) Gait Pattern/deviations: Step-through pattern;Decreased stride length;Trunk flexed Gait velocity: WFL   General Gait Details: increased gait speed and tolerated increased distance   Stairs            Wheelchair Mobility    Modified Rankin (Stroke Patients Only)       Balance                                    Cognition Arousal/Alertness: Awake/alert Behavior During Therapy: WFL for tasks assessed/performed Overall Cognitive Status: Within Functional Limits for tasks assessed                      Exercises      General Comments        Pertinent Vitals/Pain Pain Assessment: 0-10 Pain Score: 3  Pain  Location: ABD Pain Descriptors / Indicators: Sore Pain Intervention(s): Monitored during session;Repositioned    Home Living                      Prior Function            PT Goals (current goals can now be found in the care plan section) Progress towards PT goals: Progressing toward goals    Frequency  Min 3X/week    PT Plan Current plan remains appropriate    Co-evaluation             End of Session Equipment Utilized During Treatment: Gait belt Activity Tolerance: Patient tolerated treatment well Patient left: in chair;with call bell/phone within reach;with chair alarm set;with family/visitor present     Time: 5631-4970 PT Time Calculation (min) (ACUTE ONLY): 10 min  Charges:  $Gait Training: 8-22 mins                    G Codes:      Rica Koyanagi  PTA [facility]  Acute  Rehab Pager      [phone]

## 2015-09-17 NOTE — Consult Note (Addendum)
WOC ostomy follow up Stoma type/location: LLQ Colostomy Stomal assessment/size: Slightly less than 2 inches round, in a crease. Moist, red.  Peristomal assessment: Slight depression circumferentially and as noted above in a crease Treatment options for stomal/peristomal skin: Skin barrier ring and flat, one-piece pouching system Output Soft brown stool Ostomy pouching: 1pc.flat pouching system with skin barrier ring Education provided: Extended session for patient is shown the pouch removal, pouch preparation and pouch application procedure.  It is stressed that while he has longer to learn those skills, he must be competent in emptying independently prior to discharge home with family and [facility] support. He is able to demonstrate Lock and Roll closure today and how to empty pouch and simulate cleaning bottom 2 inches of pouch with toilet paper. Nursing staff are asked to reinforce emptying for the rest of day and through the night in anticipation of discharge. Supplies (5 pouches, 5 rings) are provided to the bedside in addition to an educational booklet and a 1-page teaching/tip sheet. Enrolled patient in [place] Start Discharge program: Yes [place] nursing team will follow, and will remain available to this patient, the nursing, srugical and medical teams.   Thanks, Maudie Flakes, MSN, RN, [place], [place], [place] [phone])

## 2015-09-17 NOTE — Progress Notes (Signed)
Pt plan to dc with  [facility] RN.

## 2015-09-17 NOTE — Progress Notes (Signed)
6 Days Post-Op  Subjective: He looks pretty good, he has stool in his ostomy bag.  He has not changed it or had much practice with caring for the ostomy.  He has walked some and is getting OOB.  He wants to go home, when ready.    Objective: Vital signs in last 24 hours: Temp:  [97.8 F (36.6 C)-98.3 F (36.8 C)] 97.8 F (36.6 C) (10/12 0439) Pulse Rate:  [61-77] 61 (10/12 0439) Resp:  [17-18] 18 (10/12 0439) BP: (133-150)/(61-69) 150/61 mmHg (10/12 0439) SpO2:  [95 %-98 %] 95 % (10/12 0439) Last BM Date: 09/16/15 Full liquids Nothing PO recorded 2050 ml urine Afebrile, VSS K+ 3.4  Intake/Output from previous day: 10/11 0701 - 10/12 0700 In: 300 [IV Piggyback:300] Out: 2050 [Urine:2050] Intake/Output this shift:    General appearance: alert, cooperative and no distress GI: soft, non-tender; bowel sounds normal; no masses,  no organomegaly and midline incision looks fine, some erythema around one of the staples.  Ostomy working well..     Lab Results:  No results for input(s): WBC, HGB, HCT, PLT in the last 72 hours.  BMET  Recent Labs  09/16/15 0430 09/17/15 0457  NA 138 137  K 3.1* 3.4*  CL 104 100*  CO2 28 29  GLUCOSE 119* 124*  BUN 10 14  CREATININE 0.62 0.65  CALCIUM 8.2* 8.5*   PT/INR No results for input(s): LABPROT, INR in the last 72 hours.   Recent Labs Lab 09/11/15 0526  AST 15  ALT 10*  ALKPHOS 59  BILITOT 0.6  PROT 5.9*  ALBUMIN 2.9*     Lipase     Component Value Date/Time   LIPASE 19* 09/09/2015 1020     Studies/Results: No results found.  Medications: . enoxaparin (LOVENOX) injection  40 mg Subcutaneous Q24H  . insulin aspart  0-9 Units Subcutaneous TID WC  . latanoprost  1 drop Both Eyes QHS  . levothyroxine  50 mcg Oral QAC breakfast  . metoprolol succinate  25 mg Oral Daily  . pantoprazole  40 mg Oral Daily  . simvastatin  20 mg Oral q1800   . dextrose 5 % and 0.9% NaCl 10 mL/hr (09/15/15 1057)   Colon, segmental  resection for tumor, sigmoid - BENIGN COLON WITH PERFORATED DIVERTICULAR DISEASE SEE COMMENT. - NEGATIVE FOR EPITHELIAL DYSPLASIA OR MALIGNANCY. - THIRTY LYMPH NODES, NEGATIVE FOR TUMOR (0/30). - SURGICAL MARGINS, NEGATIVE FOR DYSPLASIA OR MALIGNANCYElevate  Assessment/Plan Sigmoid colon obstruction with Exploratory sigmoid colectomy and colostomy 09/11/15, Dr. Excell Seltzer Elevated Troponin Hypertension Pulmonary edema - diuresed 09/15/15 [facility] Urinary retention with foley in place Hypokalemia, add PO K+ Antibiotics:  None  Pre op only DVT:  Lovenox/SCD   Plan:  Soft diet, continue education and use of ostomy.  Set up for home health.  Pathology is negative for cancer and I gave him a copy of the report.  I think if he tolerates soft diet,  is mobile, and safe he can go home next 24-48 hours from our standpoint.  Will defer to Medicine on this.  I have added some PO KCL.        LOS: 8 days    Kenneth [name] 09/17/2015

## 2015-09-17 NOTE — Progress Notes (Addendum)
Patient ID: Kenneth [name], male   DOB: 1928-03-18, 81 y.o.   MRN: [id number]  [facility] PROGRESS NOTE  Kenneth [name] CBS:[id number] DOB: [birth year] DOA: 09/09/2015 PCP: Irven Shelling, MD   Brief narrative:    Pt is 81 yo white male who presented to [facility] with main concern of several months duration of progressively worsening LLQ abdominal pain that was initially intermittent and 3-4/10 in severity but has progressed to constant and now 10/10 in severity over the past 24 hours, occasionally radiating to epigastric area, no specific alleviating factors, worse with eating and associated nausea but no vomiting.   In ED, pt noted to be hemodynamically stable, VS notable for BP 214/73, imaging studies notable for colonic mass with ? SBO. Surgery team consulted and [facility] asked to admit for evaluation.   Assessment/Plan:    1. Elevated troponin - in pt with known CAD and history of prior CABG, trop elevation, suspect demand ischemia. Peak of 0.15. No cardiac symptoms. Statin and BB restarted. Patient doing well. Appreciate cardiology following.  2. Hypertensive urgency - resolved, currently on Toprol-XL by mouth restarted by cardiology, Chlorthalidone also added today for better BP control  3. Pulmonary edema - Was given Lasix 10/10 and diuresed 1700 mL of urine. Follow BMP 4. Hypokalemia - continue to supplement and repeat BMP in AM 5. Hemoptysis - resolved,? NG tube trauma, chest x-ray shows bibasilar atelectasis with left pleural effusion 6. Diabetes mellitus with complications of glaucoma - continue sliding scale insulin with NovoLog 7. S/p sigmoid colectomy, colostomy for obstructing colon mass - Sigmoid colon obstruction with Exploratory sigmoid colectomy and colostomy 09/11/15, Dr. Excell Seltzer, path report negative for malignancy, advancing diet as pt able to tolerate  8. ? Suicidal ideation - denies  9. DVT prophylaxis- Lovenox SQ  Code Status: Full.  Family  Communication:  plan of care discussed with the patient Disposition Plan: SNF when cleared by surgery and GI teams   IV access:  Peripheral IV  Procedures and diagnostic studies:    Dg Chest 2 View 09/13/2015 Nasogastric tube to hiatal hernia. 2. Increase in bibasilar atelectasis/infiltrate, left greater than right, with possible left effusion.   Ct Abdomen Pelvis W Contrast 09/09/2015 Abnormal sigmoid colon with diffuse wall thickening and concern for a colonic mass. The colon proximal to this abnormal sigmoid colon is distended with a large amount of stool and pericolonic inflammatory changes. There is a small amount of fluid in left upper quadrant and small to moderate amount of fluid in the pelvis. In addition, there are multiple lymph nodes throughout the sigmoid mesocolon region. Findings are highly concerning for a neoplastic process in the sigmoid colon causing at least partial obstruction and inflammatory changes in the descending colon. Large hiatal hernia containing most of the stomach. Nonobstructive right kidney stone.  Medical Consultants:  Surgery GI  Cardio   Other Consultants:  PT  IAnti-Infectives:   None   Kenneth Ramsay, MD  [facility] Pager [phone]  If 7PM-7AM, please contact night-coverage [email or web address] Password [email or web address] 09/17/2015, 7:30 PM   LOS: 8 days   HPI/Subjective: No events overnight.   Objective: Filed Vitals:   09/16/15 1545 09/16/15 2317 09/17/15 0439 09/17/15 1429  BP: 137/67 133/69 150/61 154/64  Pulse: 76 77 61 68  Temp: 98 F (36.7 C) 98.3 F (36.8 C) 97.8 F (36.6 C) 97.9 F (36.6 C)  TempSrc: Oral Oral Oral Oral  Resp: 18 17 18 16   Height:      Weight:  SpO2: 98% 96% 95% 98%    Intake/Output Summary (Last 24 hours) at 09/17/15 1930 Last data filed at 09/17/15 1520  Gross per 24 hour  Intake    360 ml  Output   1100 ml  Net   -740 ml    Exam:   General:  Pt is alert, follows commands appropriately, not in acute  distress  Cardiovascular: Regular rate and rhythm, S1/S2, no murmurs, no rubs, no gallops  Respiratory: Clear to auscultation bilaterally, no wheezing, no crackles, no rhonchi  Abdomen: Soft, non tender, non distended, bowel sounds present, no guarding  Data Reviewed: Basic Metabolic Panel:  Recent Labs Lab 09/11/15 0526 09/12/15 0548 09/13/15 0420 09/16/15 0430 09/17/15 0457  NA 140 135 139 138 137  K 3.7 3.7 3.5 3.1* 3.4*  CL 107 104 107 104 100*  CO2 24 25 27 28 29   GLUCOSE 116* 207* 169* 119* 124*  BUN 18 11 11 10 14   CREATININE 0.75 0.64 0.62 0.62 0.65  CALCIUM 8.5* 8.4* 8.2* 8.2* 8.5*   Liver Function Tests:  Recent Labs Lab 09/11/15 0526  AST 15  ALT 10*  ALKPHOS 59  BILITOT 0.6  PROT 5.9*  ALBUMIN 2.9*   CBC:  Recent Labs Lab 09/12/15 0548 09/13/15 0420  WBC 15.5* 12.6*  HGB 13.0 11.3*  HCT 39.8 35.0*  MCV 94.1 96.2  PLT 345 300   Cardiac Enzymes:  Recent Labs Lab 09/10/15 2358 09/11/15 0526  TROPONINI 0.07* 0.07*   CBG:  Recent Labs Lab 09/16/15 1620 09/16/15 2323 09/17/15 0800 09/17/15 1202 09/17/15 1701  GLUCAP 106* 112* 108* 172* 104*    Recent Results (from the past 240 hour(s))  Surgical pcr screen     Status: None   Collection Time: 09/11/15  3:55 AM  Result Value Ref Range Status   MRSA, PCR NEGATIVE NEGATIVE Final   Staphylococcus aureus NEGATIVE NEGATIVE Final    Comment:        The Xpert SA Assay (FDA approved for NASAL specimens in patients over 79 years of age), is one component of a comprehensive surveillance program.  Test performance has been validated by [facility] for patients greater than or equal to 98 year old. It is not intended to diagnose infection nor to guide or monitor treatment.      Scheduled Meds: . chlorthalidone  12.5 mg Oral Daily  . enoxaparin (LOVENOX) injection  40 mg Subcutaneous Q24H  . insulin aspart  0-9 Units Subcutaneous TID WC  . latanoprost  1 drop Both Eyes QHS  .  levothyroxine  50 mcg Oral QAC breakfast  . metoprolol succinate  25 mg Oral Daily  . pantoprazole  40 mg Oral Daily  . potassium chloride  20 mEq Oral BID PC  . simvastatin  20 mg Oral q1800   Continuous Infusions: . dextrose 5 % and 0.9% NaCl 10 mL/hr (09/15/15 1057)

## 2015-09-17 NOTE — Progress Notes (Signed)
Patient Name: Kenneth [name] Date of Encounter: 09/17/2015     Principal Problem:   Nausea and vomiting Active Problems:   Coronary atherosclerosis of autologous vein bypass graft   Hyperlipidemia   Nephrolithiasis   Glaucoma   Preop cardiovascular exam   Small bowel obstruction (HCC)   Hypertensive urgency   Type II diabetes mellitus with ophthalmic manifestations, glaucoma   Colonic mass   Intestinal obstruction (HCC)   CAD (coronary artery disease)   Elevated troponin   Hemoptysis    SUBJECTIVE  No CP or SOB. Very relieved as pathology came back negative for malignancy. Eating an omlette and bagel this AM. Happy about that  CURRENT MEDS . enoxaparin (LOVENOX) injection  40 mg Subcutaneous Q24H  . insulin aspart  0-9 Units Subcutaneous TID WC  . latanoprost  1 drop Both Eyes QHS  . levothyroxine  50 mcg Oral QAC breakfast  . metoprolol succinate  25 mg Oral Daily  . pantoprazole  40 mg Oral Daily  . simvastatin  20 mg Oral q1800    OBJECTIVE  Filed Vitals:   09/15/15 2107 09/16/15 1545 09/16/15 2317 09/17/15 0439  BP: 140/65 137/67 133/69 150/61  Pulse: 76 76 77 61  Temp: 98.3 F (36.8 C) 98 F (36.7 C) 98.3 F (36.8 C) 97.8 F (36.6 C)  TempSrc: Oral Oral Oral Oral  Resp: 18 18 17 18   Height:      Weight:      SpO2: 98% 98% 96% 95%    Intake/Output Summary (Last 24 hours) at 09/17/15 0744 Last data filed at 09/17/15 0100  Gross per 24 hour  Intake    300 ml  Output   2050 ml  Net  -1750 ml   Filed Weights   09/09/15 1507  Weight: 155 lb 6.8 oz (70.5 kg)    PHYSICAL EXAM  General: Pleasant, NAD. Neuro: Alert and oriented X 3. Moves all extremities spontaneously. Psych: Normal affect. HEENT:  Normal  Neck: Supple without bruits or JVD. Lungs:  Resp regular and unlabored, CTA. Heart: RRR no s3, s4, or murmurs. Abdomen: Soft, non-tender, non-distended, BS + x 4.  Extremities: No clubbing, cyanosis or edema. DP/PT/Radials 2+ and equal  bilaterally.  Accessory Clinical Findings  CBC No results for input(s): WBC, NEUTROABS, HGB, HCT, MCV, PLT in the last 72 hours. Basic Metabolic Panel  Recent Labs  09/16/15 0430 09/17/15 0457  NA 138 137  K 3.1* 3.4*  CL 104 100*  CO2 28 29  GLUCOSE 119* 124*  BUN 10 14  CREATININE 0.62 0.65  CALCIUM 8.2* 8.5*     TELE NSR with some sinus brady HR 49  Radiology/Studies  Dg Chest 2 View  09/13/2015  CLINICAL DATA:  Hemoptysis, diabetes, hypertension EXAM: CHEST - 2 VIEW COMPARISON:  07/17/2013 FINDINGS: Previous CABG. Nasogastric tube into a hiatal hernia. Atelectasis/ infiltrate in the lung bases has increased, left worse than right. Heart size upper limits normal. Suspect small left pleural effusion as well. The right posterior costophrenic angle is excluded from the lateral. IMPRESSION: 1. Nasogastric tube to hiatal hernia. 2. Increase in bibasilar atelectasis/infiltrate, left greater than right, with possible left effusion. Electronically Signed   By: Lucrezia Europe M.D.   On: 09/13/2015 09:44   Ct Abdomen Pelvis W Contrast  09/09/2015  CLINICAL DATA:  Left lower quadrant pain for 2 weeks. Decreased bowel movements. EXAM: CT ABDOMEN AND PELVIS WITH CONTRAST TECHNIQUE: Multidetector CT imaging of the abdomen and pelvis was performed using  the standard protocol following bolus administration of intravenous contrast. CONTRAST:  151mL OMNIPAQUE IOHEXOL 300 MG/ML  SOLN COMPARISON:  08/22/2014 FINDINGS: Stable small calcified granuloma at the left lung base. No evidence pleural effusions. No evidence for free intraperitoneal air. Again noted is a large hiatal hernia containing most of the stomach. There is no evidence for gastric obstruction or inflammatory changes. Please note that the entire stomach is not visualized. Again noted are two low-density structures in right hepatic lobe, largest measuring 2.4 cm. Findings are most compatible with hepatic cysts. Otherwise, normal appearance the  liver and the portal venous system is patent. Normal appearance of the gallbladder. There is a normal appearance of spleen. A small amount of fluid in the left upper quadrant lateral to the spleen on sequence 2, image 16. Multiple calcifications near the pancreatic head suggestive for old pancreas inflammation. Stable fullness of the right adrenal gland is unchanged. Normal appearance of the left adrenal gland. There is an 8 mm nonobstructive stone in the right kidney lower pole. Normal appearance of the left kidney. Atherosclerotic calcifications involving the aorta and iliac arteries without aneurysm. No gross abnormality to the urinary bladder or prostate. Small-moderate amount of fluid along the right side of the pelvis on sequence 2, image 65. Again noted is extensive wall thickening involving the sigmoid colon with diverticula. This abnormal wall thickening in the sigmoid colon involves a 10 cm segment. The descending colon is moderately distended with stool. There is gas and stool throughout the transverse colon and right colon. Mild inflammatory changes around the descending colon, best seen on sequence 2, image 32. In addition, there are prominent lymph nodes in the sigmoid mesocolon and along the drainage of the sigmoid colon, best seen on sequence 2, image 50. No significant dilatation of small bowel loops. Previously, there was a small hernia in the left lower abdomen which has either been repaired or less conspicuous. No acute bone abnormality. Disc space and endplates changes I7-O6 and L5-S1. IMPRESSION: Abnormal sigmoid colon with diffuse wall thickening and concern for a colonic mass. The colon proximal to this abnormal sigmoid colon is distended with a large amount of stool and pericolonic inflammatory changes. There is a small amount of fluid in left upper quadrant and small to moderate amount of fluid in the pelvis. In addition, there are multiple lymph nodes throughout the sigmoid mesocolon  region. Findings are highly concerning for a neoplastic process in the sigmoid colon causing at least partial obstruction and inflammatory changes in the descending colon. Large hiatal hernia containing most of the stomach. Nonobstructive right kidney stone. These results were called by telephone at the time of interpretation on 09/09/2015 at 12:42 pm to Dr. Carmin Muskrat , who verbally acknowledged these results. Electronically Signed   By: Markus Daft M.D.   On: 09/09/2015 12:46    ASSESSMENT AND PLAN  81 y.o. With a hx of CAD s/p CABG (1993), HLD, HTN and DM who presented with LLQ pain for 5-6 months and found to have a colonic obstruction secondary to neoplasm. Cardiology was consulted for pre-op clearance.   CAD - history of prior CABG.  - mild trop elevation, suspect demand ischemia. Peak of 0.15. No cardiac symptoms - he has ASA allergy. He is now on a full diet and tolerating this well.  Statin and BB restarted. Patient doing well. BP slightly elevated this AM. Will add back home HCTZ. K supp'd this AM by primary service.   Abdominal mass - s/p laparaotomy  and sigmoid colectomy 09/11/15 and doing well  Dispo- patient doing well, Will likely be discharged home in the next couple days. Cardiology may be able to sign off. I have arranged follow up with me as an outpatient on 10/09/15.    Judy Pimple PA-C  Pager [phone]  History and all data above reviewed.  Patient examined.  I agree with the findings as above. No pain.  No SOB The patient exam reveals COR:RRR  ,  Lungs: Clear  ,  Abd: Positive bowel sounds, no rebound no guarding, Ext No edema  .  All available labs, radiology testing, previous records reviewed. Agree with documented assessment and plan. CAD:  Agree with continued medical management.  I do not use HCTZ.  I will change to chlorthalidone.   Jeneen Rinks Jillayne Witte  1:45 PM  09/17/2015

## 2015-09-18 LAB — BASIC METABOLIC PANEL
Anion gap: 8 (ref 5–15)
BUN: 16 mg/dL (ref 6–20)
CHLORIDE: 100 mmol/L — AB (ref 101–111)
CO2: 29 mmol/L (ref 22–32)
CREATININE: 0.64 mg/dL (ref 0.61–1.24)
Calcium: 8.6 mg/dL — ABNORMAL LOW (ref 8.9–10.3)
GFR calc Af Amer: >60 mL/min (ref >60)
GFR calc non Af Amer: >60 mL/min (ref >60)
GLUCOSE: 112 mg/dL — AB (ref 65–99)
POTASSIUM: 3.7 mmol/L (ref 3.5–5.1)
Sodium: 137 mmol/L (ref 135–145)

## 2015-09-18 LAB — CBC
HEMATOCRIT: 34.5 % — AB (ref 39.0–52.0)
HEMOGLOBIN: 11.4 g/dL — AB (ref 13.0–17.0)
MCH: 30.8 pg (ref 26.0–34.0)
MCHC: 33 g/dL (ref 30.0–36.0)
MCV: 93.2 fL (ref 78.0–100.0)
Platelets: 427 10*3/uL — ABNORMAL HIGH (ref 150–400)
RBC: 3.7 MIL/uL — ABNORMAL LOW (ref 4.22–5.81)
RDW: 13.2 % (ref 11.5–15.5)
WBC: 9.5 10*3/uL (ref 4.0–10.5)

## 2015-09-18 LAB — GLUCOSE, CAPILLARY: Glucose-Capillary: 119 mg/dL — ABNORMAL HIGH (ref 65–99)

## 2015-09-18 MED ORDER — METOPROLOL SUCCINATE ER 25 MG PO TB24: 25.0000 mg | ORAL_TABLET | Freq: Every day | ORAL | Status: DC

## 2015-09-18 NOTE — Evaluation (Signed)
Occupational Therapy Evaluation Patient Details Name: Kenneth [name] MRN: [id number] DOB: Apr 29, 1928 Today's Date: 09/18/2015    History of Present Illness EXPLORATORY LAPAROTOMY, SIGMOID COLECTOMY AND COLOSTOMY - 09/11/2015    Clinical Impression   This 81 year old man was admitted for the above surgery.  He was independent prior to admission and currently needs min A overall for adls.  Goals in acute are for set up and initiating a rest break for energy conservation    Follow Up Recommendations  Home health OT    Equipment Recommendations  None recommended by OT    Recommendations for Other Services       Precautions / Restrictions Precautions Precautions: Fall Precaution Comments: colostomy Restrictions Weight Bearing Restrictions: No      Mobility Bed Mobility   Bed Mobility: Supine to Sit     Supine to sit: Min guard     General bed mobility comments: encouraged log roll and used rail to sit up  Transfers   Equipment used: 1 person hand held assist Transfers: Sit to/from Stand Sit to Stand: Min guard         General transfer comment: pt states he has been walking without RW; needs min A without this.  No LOB but unsteady    Balance                                            ADL Overall ADL's : Needs assistance/impaired     Grooming: Min guard;Wash/dry hands;Standing   Upper Body Bathing: Set up;Sitting   Lower Body Bathing: Minimal assistance;Sit to/from stand   Upper Body Dressing : Set up;Sitting   Lower Body Dressing: Minimal assistance;Sit to/from stand                 General ADL Comments: pt plans home today. Son and daughter will help out in addition to wife.  Both of them work FT.  Educated on energy conservation.  Pt with 2/4 dyspnea with activity.       Vision     Perception     Praxis      Pertinent Vitals/Pain Pain Score: 3  Pain Location: abdomen Pain Descriptors / Indicators:   (pulling) Pain Intervention(s): Limited activity within patient's tolerance;Monitored during session     Hand Dominance     Extremity/Trunk Assessment Upper Extremity Assessment Upper Extremity Assessment: Overall WFL for tasks assessed           Communication Communication Communication: HOH   Cognition Arousal/Alertness: Awake/alert Behavior During Therapy: WFL for tasks assessed/performed Overall Cognitive Status: Within Functional Limits for tasks assessed                     General Comments       Exercises       Shoulder Instructions      Home Living Family/patient expects to be discharged to:: Private residence Living Arrangements: Spouse/significant other Available Help at Discharge: Family               Bathroom Shower/Tub: Tub/shower unit             Additional Comments: wife has a shower seat in her tub-he can use this.  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 OT Goal Formulation: With patient Time For Goal Achievement: 09/25/15 Potential to Achieve Goals: Good ADL Goals Additional ADL Goal #1: pt will initiate at least one rest break for energy conservation and complete ADL with set up  OT Frequency: Min 2X/week   Barriers to D/C:            Co-evaluation              End of Session Nurse Communication:  (medication for queasiness)  Activity Tolerance: Patient tolerated treatment well Patient left:  in bed;with call bell/phone within reach   Time: 1028-1047 OT Time Calculation (min): 19 min Charges:  OT General Charges $OT Visit: 1 Procedure OT Evaluation $Initial OT Evaluation Tier I: 1 Procedure G-Codes:    Kenneth [name] 20-Sep-2015, 10:55 AM   [name], OTR/L [phone] September 20, 2015

## 2015-09-18 NOTE — Progress Notes (Signed)
D/c'd w/ son via w/c voicws no c/o

## 2015-09-18 NOTE — Progress Notes (Signed)
D/c'd foley: pushed po's w/ verbal understanding

## 2015-09-18 NOTE — Discharge Instructions (Signed)
Small Bowel Obstruction °A small bowel obstruction is a blockage in the small bowel. The small bowel, which is also called the small intestine, is a long, slender tube that connects the stomach to the colon. When a person eats and drinks, food and fluids go from the stomach to the small bowel. This is where most of the nutrients in the food and fluids are absorbed. °A small bowel obstruction will prevent food and fluids from passing through the small bowel as they normally do during digestion. The small bowel can become partially or completely blocked. This can cause symptoms such as abdominal pain, vomiting, and bloating. If this condition is not treated, it can be dangerous because the small bowel could rupture. °CAUSES °Common causes of this condition include: °· Scar tissue from previous surgery or radiation treatment. °· Recent surgery. This may cause the movements of the bowel to slow down and cause food to block the intestine. °· Hernias. °· Inflammatory bowel disease (colitis). °· Twisting of the bowel (volvulus). °· Tumors. °· A foreign body. °· Slipping of a part of the bowel into another part (intussusception). °SYMPTOMS °Symptoms of this condition include: °· Abdominal pain. This may be dull cramps or sharp pain. It may occur in one area, or it may be present in the entire abdomen. Pain can range from mild to severe, depending on the degree of obstruction. °· Nausea and vomiting. Vomit may be greenish or a yellow bile color. °· Abdominal bloating. °· Constipation. °· Lack of passing gas. °· Frequent belching. °· Diarrhea. This may occur if the obstruction is partial and runny stool is able to leak around the obstruction. °DIAGNOSIS °This condition may be diagnosed based on a physical exam, medical history, and X-rays of the abdomen. You may also have other tests, such as a CT scan of the abdomen and pelvis. °TREATMENT °Treatment for this condition depends on the cause and severity of the problem.  Treatment options may include: °· Bed rest along with fluids and pain medicines that are given through an IV tube inserted into one of your veins. Sometimes, this is all that is needed for the obstruction to improve. °· Following a simple diet. In some cases, a clear liquid diet may be required for several days. This allows the bowel to rest. °· Placement of a small tube (nasogastric tube) into the stomach. When the bowel is blocked, it usually swells up like a balloon that is filled with air and fluids. The air and fluids may be removed by suction through the nasogastric tube. This can help with pain, discomfort, and nausea. It can also help the obstruction to clear up faster. °· Surgery. This may be required if other treatments do not work. Bowel obstruction from a hernia may require early surgery and can be an emergency procedure. Surgery may also be required for scar tissue that causes frequent or severe obstructions. °HOME CARE INSTRUCTIONS °· Get plenty of rest. °· Follow instructions from your health care provider about eating restrictions. You may need to avoid solid foods and consume only clear liquids until your condition improves. °· Take over-the-counter and prescription medicines only as told by your health care provider. °· Keep all follow-up visits as told by your health care provider. This is important. °SEEK MEDICAL CARE IF: °· You have a fever. °· You have chills. °SEEK IMMEDIATE MEDICAL CARE IF: °· You have increased pain or cramping. °· You vomit blood. °· You have uncontrolled vomiting or nausea. °· You cannot drink   fluids because of vomiting or pain. °· You develop confusion. °· You begin feeling very dry or thirsty (dehydrated). °· You have severe bloating. °· You feel extremely weak or you faint. °  °This information is not intended to replace advice given to you by your health care provider. Make sure you discuss any questions you have with your health care provider. °  °Document Released:  02/08/2006 Document Revised: 08/13/2015 Document Reviewed: 01/16/2015 °Elsevier Interactive Patient Education ©2016 [employer] ° ° °

## 2015-09-18 NOTE — Progress Notes (Signed)
Patient ID: Kenneth [name], male   DOB: 09/18/1928, 81 y.o.   MRN: [id number]  [facility]  POD#: 7  Subjective: Patient in bed, eating breakfast, regular diet.  Objective: Vital signs in last 24 hours: Temp:  [97.9 F (36.6 C)-98.2 F (36.8 C)] 98.2 F (36.8 C) (10/13 0517) Pulse Rate:  [63-71] 63 (10/13 0517) Resp:  [16] 16 (10/13 0517) BP: (145-162)/(60-74) 162/60 mmHg (10/13 0517) SpO2:  [96 %-98 %] 96 % (10/13 0517) Last BM Date:  (brown soft stool )  Intake/Output from previous day: 10/12 0701 - 10/13 0700 In: 360 [P.O.:360] Out: 250 [Urine:250] Intake/Output this shift:    Physical Exam: HEENT - sclerae clear, mucous membranes moist Abdomen - soft without distension; midline wound clear, dry, and intact with staples in place; ostomy with stool in bag Ext - no edema, non-tender Neuro - alert & oriented, no focal deficits  Lab Results:   Recent Labs  09/18/15 0514  WBC 9.5  HGB 11.4*  HCT 34.5*  PLT 427*   BMET  Recent Labs  09/17/15 0457 09/18/15 0514  NA 137 137  K 3.4* 3.7  CL 100* 100*  CO2 29 29  GLUCOSE 124* 112*  BUN 14 16  CREATININE 0.65 0.64  CALCIUM 8.5* 8.6*   PT/INR No results for input(s): LABPROT, INR in the last 72 hours. Comprehensive Metabolic Panel:    Component Value Date/Time   NA 137 09/18/2015 0514   NA 137 09/17/2015 0457   K 3.7 09/18/2015 0514   K 3.4* 09/17/2015 0457   CL 100* 09/18/2015 0514   CL 100* 09/17/2015 0457   CO2 29 09/18/2015 0514   CO2 29 09/17/2015 0457   BUN 16 09/18/2015 0514   BUN 14 09/17/2015 0457   CREATININE 0.64 09/18/2015 0514   CREATININE 0.65 09/17/2015 0457   GLUCOSE 112* 09/18/2015 0514   GLUCOSE 124* 09/17/2015 0457   CALCIUM 8.6* 09/18/2015 0514   CALCIUM 8.5* 09/17/2015 0457   AST 15 09/11/2015 0526   AST 21 09/09/2015 1020   ALT 10* 09/11/2015 0526   ALT 14* 09/09/2015 1020   ALKPHOS 59 09/11/2015 0526   ALKPHOS 73 09/09/2015 1020   BILITOT 0.6 09/11/2015 0526   BILITOT 0.5 09/09/2015 1020   PROT 5.9* 09/11/2015 0526   PROT 7.0 09/09/2015 1020   ALBUMIN 2.9* 09/11/2015 0526   ALBUMIN 3.5 09/09/2015 1020    Studies/Results: No results found.  Anti-infectives: Anti-infectives    Start     Dose/Rate Route Frequency Ordered Stop   09/11/15 0600  clindamycin (CLEOCIN) IVPB 900 mg     900 mg 100 mL/hr over 30 Minutes Intravenous On call to O.R. 09/10/15 1623 09/11/15 1108   09/11/15 0600  gentamicin (GARAMYCIN) IVPB 100 mg  Status:  Discontinued     100 mg 200 mL/hr over 30 Minutes Intravenous On call to O.R. 09/10/15 1623 09/10/15 1631   09/11/15 0600  gentamicin (GARAMYCIN) 350 mg in dextrose 5 % 100 mL IVPB     350 mg 217.5 mL/hr over 30 Minutes Intravenous On call to O.R. 09/10/15 1630 09/11/15 1150      Assessment & Plans: Status post sigmoid colectomy and colostomy 09/11/15, Dr. Excell Seltzer  Continue to instruct in colostomy care  Would wait 10-14 days before removing staples from midline wound  Will arrange follow up at [place] office with Dr. Excell Seltzer 2-3 weeks  Will sign off - call if needed during remainder of hospitalization  [name], MD, [facility] Office: [place] 09/18/2015

## 2015-09-18 NOTE — Progress Notes (Signed)
Patient Name: DISHON KEHOE Date of Encounter: 09/18/2015     Principal Problem:   Nausea and vomiting Active Problems:   Coronary atherosclerosis of autologous vein bypass graft   Hyperlipidemia   Nephrolithiasis   Glaucoma   Preop cardiovascular exam   Small bowel obstruction (HCC)   Hypertensive urgency   Type II diabetes mellitus with ophthalmic manifestations, glaucoma   Colonic mass   Intestinal obstruction (HCC)   CAD (coronary artery disease)   Elevated troponin   Hemoptysis    SUBJECTIVE  No CP or SOB. Up walking with RN. Feeling good.  CURRENT MEDS . chlorthalidone  12.5 mg Oral Daily  . enoxaparin (LOVENOX) injection  40 mg Subcutaneous Q24H  . insulin aspart  0-9 Units Subcutaneous TID WC  . latanoprost  1 drop Both Eyes QHS  . levothyroxine  50 mcg Oral QAC breakfast  . metoprolol succinate  25 mg Oral Daily  . pantoprazole  40 mg Oral Daily  . potassium chloride  20 mEq Oral BID PC  . simvastatin  20 mg Oral q1800    OBJECTIVE  Filed Vitals:   09/17/15 1429 09/17/15 2305 09/18/15 0517 09/18/15 0936  BP: 154/64 145/74 162/60 137/65  Pulse: 68 71 63 88  Temp: 97.9 F (36.6 C) 98.2 F (36.8 C) 98.2 F (36.8 C)   TempSrc: Oral Oral Oral   Resp: 16 16 16    Height:      Weight:      SpO2: 98% 97% 96%     Intake/Output Summary (Last 24 hours) at 09/18/15 0942 Last data filed at 09/17/15 1520  Gross per 24 hour  Intake    360 ml  Output    250 ml  Net    110 ml   Filed Weights   09/09/15 1507  Weight: 155 lb 6.8 oz (70.5 kg)    PHYSICAL EXAM  General: Pleasant, NAD. Neuro: Alert and oriented X 3. Moves all extremities spontaneously. Psych: Normal affect. HEENT:  Normal  Neck: Supple without bruits or JVD. Lungs:  Resp regular and unlabored, CTA. Heart: RRR no s3, s4, or murmurs. Abdomen: Soft, non-tender, non-distended, BS + x 4.  Extremities: No clubbing, cyanosis or edema. DP/PT/Radials 2+ and equal bilaterally.  Accessory  Clinical Findings  CBC  Recent Labs  09/18/15 0514  WBC 9.5  HGB 11.4*  HCT 34.5*  MCV 93.2  PLT 128*   Basic Metabolic Panel  Recent Labs  09/17/15 0457 09/18/15 0514  NA 137 137  K 3.4* 3.7  CL 100* 100*  CO2 29 29  GLUCOSE 124* 112*  BUN 14 16  CREATININE 0.65 0.64  CALCIUM 8.5* 8.6*     TELE NSR with some sinus brady HR 49  Radiology/Studies  Dg Chest 2 View  09/13/2015  CLINICAL DATA:  Hemoptysis, diabetes, hypertension EXAM: CHEST - 2 VIEW COMPARISON:  07/17/2013 FINDINGS: Previous CABG. Nasogastric tube into a hiatal hernia. Atelectasis/ infiltrate in the lung bases has increased, left worse than right. Heart size upper limits normal. Suspect small left pleural effusion as well. The right posterior costophrenic angle is excluded from the lateral. IMPRESSION: 1. Nasogastric tube to hiatal hernia. 2. Increase in bibasilar atelectasis/infiltrate, left greater than right, with possible left effusion. Electronically Signed   By: Lucrezia Europe M.D.   On: 09/13/2015 09:44   Ct Abdomen Pelvis W Contrast  09/09/2015  CLINICAL DATA:  Left lower quadrant pain for 2 weeks. Decreased bowel movements. EXAM: CT ABDOMEN AND PELVIS WITH  CONTRAST TECHNIQUE: Multidetector CT imaging of the abdomen and pelvis was performed using the standard protocol following bolus administration of intravenous contrast. CONTRAST:  150mL OMNIPAQUE IOHEXOL 300 MG/ML  SOLN COMPARISON:  08/22/2014 FINDINGS: Stable small calcified granuloma at the left lung base. No evidence pleural effusions. No evidence for free intraperitoneal air. Again noted is a large hiatal hernia containing most of the stomach. There is no evidence for gastric obstruction or inflammatory changes. Please note that the entire stomach is not visualized. Again noted are two low-density structures in right hepatic lobe, largest measuring 2.4 cm. Findings are most compatible with hepatic cysts. Otherwise, normal appearance the liver and the  portal venous system is patent. Normal appearance of the gallbladder. There is a normal appearance of spleen. A small amount of fluid in the left upper quadrant lateral to the spleen on sequence 2, image 16. Multiple calcifications near the pancreatic head suggestive for old pancreas inflammation. Stable fullness of the right adrenal gland is unchanged. Normal appearance of the left adrenal gland. There is an 8 mm nonobstructive stone in the right kidney lower pole. Normal appearance of the left kidney. Atherosclerotic calcifications involving the aorta and iliac arteries without aneurysm. No gross abnormality to the urinary bladder or prostate. Small-moderate amount of fluid along the right side of the pelvis on sequence 2, image 65. Again noted is extensive wall thickening involving the sigmoid colon with diverticula. This abnormal wall thickening in the sigmoid colon involves a 10 cm segment. The descending colon is moderately distended with stool. There is gas and stool throughout the transverse colon and right colon. Mild inflammatory changes around the descending colon, best seen on sequence 2, image 32. In addition, there are prominent lymph nodes in the sigmoid mesocolon and along the drainage of the sigmoid colon, best seen on sequence 2, image 50. No significant dilatation of small bowel loops. Previously, there was a small hernia in the left lower abdomen which has either been repaired or less conspicuous. No acute bone abnormality. Disc space and endplates changes B4-W9 and L5-S1. IMPRESSION: Abnormal sigmoid colon with diffuse wall thickening and concern for a colonic mass. The colon proximal to this abnormal sigmoid colon is distended with a large amount of stool and pericolonic inflammatory changes. There is a small amount of fluid in left upper quadrant and small to moderate amount of fluid in the pelvis. In addition, there are multiple lymph nodes throughout the sigmoid mesocolon region. Findings  are highly concerning for a neoplastic process in the sigmoid colon causing at least partial obstruction and inflammatory changes in the descending colon. Large hiatal hernia containing most of the stomach. Nonobstructive right kidney stone. These results were called by telephone at the time of interpretation on 09/09/2015 at 12:42 pm to Dr. Carmin Muskrat , who verbally acknowledged these results. Electronically Signed   By: Markus Daft M.D.   On: 09/09/2015 12:46    ASSESSMENT AND PLAN  81 y.o. With a hx of CAD s/p CABG (1993), HLD, HTN and DM who presented with LLQ pain for 5-6 months and found to have a colonic obstruction secondary to neoplasm. Cardiology was consulted for pre-op clearance.   CAD - history of prior CABG.  - mild trop elevation, suspect demand ischemia. Peak of 0.15. No cardiac symptoms - he has ASA allergy. He is now on a full diet and tolerating this well.  Statin and BB restarted. Patient doing well.   HTN- BP better controlled after chlorthalidone 12.5mg  added yesterday  Abdominal mass - s/p laparaotomy and sigmoid colectomy 09/11/15 and doing well  Dispo- patient doing well, Will likely be discharged home in the next couple days. Cardiology may be able to sign off. I have arranged follow up with me as an outpatient on 10/09/15.    Judy Pimple PA-C  Pager [phone]

## 2015-09-18 NOTE — Discharge Summary (Signed)
Physician Discharge Summary  Kenneth [name]:[id number] DOB: July 26, 1928 DOA: 09/09/2015  PCP: Kenneth Shelling, MD  Admit date: 09/09/2015 Discharge date: 09/18/2015  Recommendations for Outpatient Follow-up:  1. Pt will need to follow up with PCP in 2-3 weeks post discharge 2. Please obtain BMP to evaluate electrolytes and kidney function 3. Please also check CBC to evaluate Hg and Hct levels  Discharge Diagnoses:  Principal Problem:   Nausea and vomiting Active Problems:   Small bowel obstruction (HCC)   Hypertensive urgency   Type II diabetes mellitus with ophthalmic manifestations, glaucoma   Hyperlipidemia   Nephrolithiasis   Glaucoma   Coronary atherosclerosis of autologous vein bypass graft   Preop cardiovascular exam   Colonic mass   Intestinal obstruction (HCC)   CAD (coronary artery disease)   Elevated troponin   Hemoptysis  Discharge Condition: Stable  Diet recommendation: Heart healthy diet discussed in details    Brief narrative:    Pt is 81 yo white male who presented to [facility] ED with main concern of several months duration of progressively worsening LLQ abdominal pain that was initially intermittent and 3-4/10 in severity but has progressed to constant and now 10/10 in severity over the past 24 hours, occasionally radiating to epigastric area, no specific alleviating factors, worse with eating and associated nausea but no vomiting.   In ED, pt noted to be hemodynamically stable, VS notable for BP 214/73, imaging studies notable for colonic mass with ? SBO. Surgery team consulted and [facility] asked to admit for evaluation.   Assessment/Plan:    1. Elevated troponin - in pt with known CAD and history of prior CABG, trop elevation, suspect demand ischemia. Peak of 0.15. No cardiac symptoms. Statin and BB restarted. Patient doing well. Appreciate cardiology following. Pt cleared for d/c from cardiology stand point  2. Hypertensive urgency - resolved, continue  with home medical regimen  3. Pulmonary edema - Was given Lasix 10/10 and diuresed 1700 mL of urine. Stable with no signs of volume overload 4. Hypokalemia - supplemented  5. Hemoptysis - resolved,? NG tube trauma, chest x-ray shows bibasilar atelectasis with left pleural effusion 6. Diabetes mellitus with complications of glaucoma - continue Metformin  7. S/p sigmoid colectomy, colostomy for obstructing colon mass - Sigmoid colon obstruction with Exploratory sigmoid colectomy and colostomy 09/11/15, Dr. Excell Seltzer, path report negative for malignancy, advancing diet as pt able to tolerate  8. ? Suicidal ideation - denies   Code Status: Full.  Family Communication: plan of care discussed with the patient Disposition Plan: Home   IV access:  Peripheral IV  Procedures and diagnostic studies:   Dg Chest 2 View 09/13/2015 Nasogastric tube to hiatal hernia. 2. Increase in bibasilar atelectasis/infiltrate, left greater than right, with possible left effusion.   Ct Abdomen Pelvis W Contrast 09/09/2015 Abnormal sigmoid colon with diffuse wall thickening and concern for a colonic mass. The colon proximal to this abnormal sigmoid colon is distended with a large amount of stool and pericolonic inflammatory changes. There is a small amount of fluid in left upper quadrant and small to moderate amount of fluid in the pelvis. In addition, there are multiple lymph nodes throughout the sigmoid mesocolon region. Findings are highly concerning for a neoplastic process in the sigmoid colon causing at least partial obstruction and inflammatory changes in the descending colon. Large hiatal hernia containing most of the stomach. Nonobstructive right kidney stone.  Medical Consultants:  Surgery GI Cardio   Other Consultants:  PT  IAnti-Infectives:  None        Discharge Exam: Filed Vitals:   09/18/15 0936  BP: 137/65  Pulse: 88  Temp:   Resp:    Filed Vitals:    09/17/15 1429 09/17/15 2305 09/18/15 0517 09/18/15 0936  BP: 154/64 145/74 162/60 137/65  Pulse: 68 71 63 88  Temp: 97.9 F (36.6 C) 98.2 F (36.8 C) 98.2 F (36.8 C)   TempSrc: Oral Oral Oral   Resp: 16 16 16    Height:      Weight:      SpO2: 98% 97% 96%     General: Pt is alert, follows commands appropriately, not in acute distress Cardiovascular: Regular rate and rhythm, no rubs, no gallops Respiratory: Clear to auscultation bilaterally, no wheezing, no crackles, no rhonchi Abdominal: Soft, non tender, non distended, bowel sounds +, no guarding  Discharge Instructions  Discharge Instructions    Call MD for:  difficulty breathing, headache or visual disturbances    Complete by:  As directed      Call MD for:  persistant dizziness or light-headedness    Complete by:  As directed      Call MD for:  persistant nausea and vomiting    Complete by:  As directed      Call MD for:  severe uncontrolled pain    Complete by:  As directed      Diet - low sodium heart healthy    Complete by:  As directed      Increase activity slowly    Complete by:  As directed             Medication List    TAKE these medications        acetaminophen 325 MG tablet  Commonly known as:  TYLENOL  Take 325 mg by mouth every 6 (six) hours as needed for mild pain.     ferrous sulfate 325 (65 FE) MG tablet  Take 325 mg by mouth daily with breakfast.     hydrochlorothiazide 25 MG tablet  Commonly known as:  HYDRODIURIL  Take 12.5 mg by mouth daily.     ipratropium 0.03 % nasal spray  Commonly known as:  ATROVENT  Place 2 sprays into both nostrils 2 (two) times daily as needed for rhinitis.     isosorbide mononitrate 60 MG 24 hr tablet  Commonly known as:  IMDUR  Take 60 mg by mouth every evening.     latanoprost 0.005 % ophthalmic solution  Commonly known as:  XALATAN  Place 1 drop into both eyes at bedtime.     levothyroxine 50 MCG tablet  Commonly known as:  SYNTHROID, LEVOTHROID   Take 50 mcg by mouth daily before breakfast.     metFORMIN 1000 MG tablet  Commonly known as:  GLUCOPHAGE  Take 500 mg by mouth daily with breakfast.     metoprolol succinate 25 MG 24 hr tablet  Commonly known as:  TOPROL-XL  Take 1 tablet (25 mg total) by mouth daily.     multivitamin with minerals Tabs tablet  Take 1 tablet by mouth daily.     nitroGLYCERIN 0.4 MG SL tablet  Commonly known as:  NITROSTAT  Place 0.4 mg under the tongue every 5 (five) minutes as needed for chest pain.     pantoprazole 40 MG tablet  Commonly known as:  PROTONIX  Take 40 mg by mouth daily.     potassium chloride 10 MEQ tablet  Commonly known as:  K-DUR,KLOR-CON  Take 10 mEq by mouth daily.     simvastatin 20 MG tablet  Commonly known as:  ZOCOR  Take 20 mg by mouth every evening.     VITAMIN B-12 PO  Take 1 tablet by mouth daily.           Follow-up Information    Follow up with Eileen Stanford, PA-C On 10/09/2015.   Specialties:  Cardiology, Radiology   Why:  @ 11am   Contact information:   [place] [id number] [phone]       Follow up with Kenneth Shelling, MD. Schedule an appointment as soon as possible for a visit in 1 week.   Specialty:  Internal Medicine   Contact information:   [address] [place] [address] [phone]        The results of significant diagnostics from this hospitalization (including imaging, microbiology, ancillary and laboratory) are listed below for reference.     Microbiology: Recent Results (from the past 240 hour(s))  Surgical pcr screen     Status: None   Collection Time: 09/11/15  3:55 AM  Result Value Ref Range Status   MRSA, PCR NEGATIVE NEGATIVE Final   Staphylococcus aureus NEGATIVE NEGATIVE Final    Comment:        The Xpert SA Assay (FDA approved for NASAL specimens in patients over 34 years of age), is one component of a comprehensive surveillance program.  Test  performance has been validated by [facility] for patients greater than or equal to 38 year old. It is not intended to diagnose infection nor to guide or monitor treatment.      Labs: Basic Metabolic Panel:  Recent Labs Lab 09/12/15 0548 09/13/15 0420 09/16/15 0430 09/17/15 0457 09/18/15 0514  NA 135 139 138 137 137  K 3.7 3.5 3.1* 3.4* 3.7  CL 104 107 104 100* 100*  CO2 25 27 28 29 29   GLUCOSE 207* 169* 119* 124* 112*  BUN 11 11 10 14 16   CREATININE 0.64 0.62 0.62 0.65 0.64  CALCIUM 8.4* 8.2* 8.2* 8.5* 8.6*   CBC:  Recent Labs Lab 09/12/15 0548 09/13/15 0420 09/18/15 0514  WBC 15.5* 12.6* 9.5  HGB 13.0 11.3* 11.4*  HCT 39.8 35.0* 34.5*  MCV 94.1 96.2 93.2  PLT 345 300 427*   CBG:  Recent Labs Lab 09/17/15 0800 09/17/15 1202 09/17/15 1701 09/17/15 2310 09/18/15 0758  GLUCAP 108* 172* 104* 146* 119*     SIGNED: Time coordinating discharge: 30 minutes  MAGICK-Arvie Villarruel, MD  [facility] 09/18/2015, 9:56 AM Pager [phone]  If 7PM-7AM, please contact night-coverage [email or web address] Password [email or web address]

## 2015-09-23 ENCOUNTER — Telehealth: Payer: Self-pay | Admitting: Interventional Cardiology

## 2015-09-23 NOTE — Telephone Encounter (Signed)
Returned pt daughter call. Adv pt daughter that pt should f/u with his pcp for pain med and nausea Rx.she verbalized understanding.

## 2015-09-23 NOTE — Telephone Encounter (Signed)
New Message  Pt dtr calling to speak w/ RN- pt is requesting pain medication and something for his stomach issues. Please call back and discuss.

## 2015-10-06 NOTE — Progress Notes (Signed)
Cardiology Office Note   Date:  10/09/2015   ID:  KAMON FAHR, DOB 1928/02/17, MRN [id number]  PCP:  Irven Shelling, MD  Cardiologist:  Dr. Meriam Sprague follow up    History of Present Illness: Kenneth [name] is a 81 y.o. male with a history of CAD s/p CABG (1993), HLD, HTN and DM who presents for post hospital follow up after admission for small bowel obstruction 2/2 nonmalignant neoplasm.   He had CABG in 1993: LIMA to LAD, SVG to obtuse marginal one, SVG to posterior descending. He had an abnormal nuclear stress test in 2012 and underwent LHC CONCLUSIONS: 1. Severe native vessel coronary artery disease with total occlusion  of the distal left main and the proximal RCA. 2. Patent saphenous vein and left internal mammary artery graft as  noted above. 3. Abnormal nuclear study with basal and mid anterior wall ischemia  due to occlusion of the ostial LAD preventing antegrade flow and  occlusion of the mid-LAD at the anastomoses with the internal  mammary leaving the proximal and mid anterior wall depending upon  poorly formed collaterals from the right coronary territory and the  obtuse marginal territory. The patient also has diffuse disease in  the LAD distal to the LIMA graft insertion, however, there is no  evidence of ischemia in the distal and apical LAD territory. 4. Preserved overall LV function with inferior wall motion  abnormalities PLAN: 1. Medication adjustments will include addition of long-acting  nitrates to hopefully help improve collateral blood flow to the  proximal and mid anterior wall. Aggressive systolic blood pressure  control. There is no reasonable treatment option with reference to  percutaneous angioplasty given the patient's anatomy. Despite the  high risk appearance of the patient's nuclear study, the anatomy  would suggest a relatively stable clinical situation, although  time  and perhaps medication adjustment may help to further decrease the  patient's risk by improving collateral flow to the anterior wall.  He presented to [facility] on 09/09/15 with LLQ pain for 5-6 months and found to have a colonic obstruction. His troponin was noted to be mildly elevated (0.14--> 0.15--> 0.12--> 0.07--> 0.07) and cardiology was consulted for pre-op clearance. Dr. Johnsie Cancel felt mild troponin elevation was due to demand ischemia and cleared him to proceed with surgery as it was emergent due to pain. He underwent exploratory sigmoid colectomy and colostomy 09/11/15 w/ Dr. Excell Seltzer. Fortunately, the path report was negative for malignancy. The patient also had issues with hypertensive urgency and some pulmonary edema which resolved after IV lasix. Dr. Percival Spanish switched his HCTZ to chlorthalidone and resumed statin and BB when tolerating POs. He was discharged on 09/18/15. HCTZ was resumed instead of chlorthalidone.   Patient presents for post hospital follow up. He reports having some chest discomfort associated with SOB and nuasea with exertion while walking at [facility]. Per patient this has been going on for multiple years. He has not required any SL NTG and it goes away quickly with rest. It does not bother him. He does not like his colostomy bag and wishes he could drive. Otherwise she he has no complaints. No LE edema, orthopnea, PND or SOB.     Past Medical History  Diagnosis Date  . Diabetes ([place])   . HTN (hypertension)   . Coronary atherosclerosis of unspecified type of vessel, native or graft     CABG 1993, LIMA to LAD, SVG to obtuse marginal one, SVG to posterior  descending, stable angina  . Hyperlipidemia `  . GERD (gastroesophageal reflux disease)   . Chronic rhinitis   . Glaucoma   . Melanoma of skin, site unspecified   . Hernia     left side  . Complication of anesthesia   . PONV (postoperative nausea and vomiting)   . Anemia     on iron  . [facility] spotted fever 2-3 yrs ago  . CAD (coronary artery disease)   . Diverticulosis of colon   . Anginal pain ([place])     last 3 days ago goes away if stop and rest  . Dysrhythmia     occ "skips a beat"  . History of kidney stones   . H/O hiatal hernia   . Arthritis     Past Surgical History  Procedure Laterality Date  . Right leg fracture age 47    . Coronary artery bypass graft  1990's    x 3  . Tonsillectomy  age 50 or 62  . Colonoscopy with propofol N/A 11/27/2013    Procedure: COLONOSCOPY WITH PROPOFOL;  Surgeon: Garlan Fair, MD;  Location: [facility];  Service: Endoscopy;  Laterality: N/A;  . Inguinal hernia repair Left 06/28/2014    Procedure: Left  INGUINAL HERNIA REPAIR ;  Surgeon: Merrie Roof, MD;  Location[facility];  Service: General;  Laterality: Left;  . Insertion of mesh Left 06/28/2014    Procedure: INSERTION OF MESH;  Surgeon: Merrie Roof, MD;  [facility];  Service: General;  Laterality: Left;  . Hernia repair  [phone]    lft ing hernia  . Insertion of mesh N/A 10/11/2014  . Laparoscopy  10/11/2014  . Ventral hernia repair  10/11/2014    Procedure: HERNIA REPAIR VENTRAL ADULT;  Surgeon: Autumn Messing III, MD;  Location: [place];  Service: General;;  . Laparotomy N/A 09/11/2015    Procedure: EXPLORATORY LAPAROTOMY;  Surgeon: Excell Seltzer, MD;  [facility];  Service: General;  Laterality: N/A;  . Partial colectomy N/A 09/11/2015    Procedure: SIGMOID COLECTOMY AND COLOSTOMY;  Surgeon: Excell Seltzer, MD;  [facility];  Service: General;  Laterality: N/A;     Current Outpatient Prescriptions  Medication Sig Dispense Refill  . acetaminophen (TYLENOL) 325 MG tablet Take 325 mg by mouth every 6 (six) hours as needed for mild pain.     . Cyanocobalamin (VITAMIN B-12 PO) Take 1 tablet by mouth daily.    . ferrous sulfate 325 (65 FE) MG tablet Take 325 mg by mouth daily with breakfast.    . hydrochlorothiazide (HYDRODIURIL) 25 MG tablet Take  12.5 mg by mouth daily.   0  . ipratropium (ATROVENT) 0.03 % nasal spray Place 2 sprays into both nostrils 2 (two) times daily as needed for rhinitis.    [name] isosorbide mononitrate (IMDUR) 60 MG 24 hr tablet Take 60 mg by mouth every evening.     . latanoprost (XALATAN) 0.005 % ophthalmic solution Place 1 drop into both eyes at bedtime.    [name] levothyroxine (SYNTHROID, LEVOTHROID) 50 MCG tablet Take 50 mcg by mouth daily before breakfast.    . metFORMIN (GLUCOPHAGE) 1000 MG tablet Take 500 mg by mouth daily with breakfast.     . metoprolol succinate (TOPROL-XL) 25 MG 24 hr tablet Take 1 tablet (25 mg total) by mouth daily. 30 tablet 0  . Multiple Vitamin (MULTIVITAMIN WITH MINERALS) TABS tablet Take 1 tablet by mouth daily.    [name]  nitroGLYCERIN (NITROSTAT) 0.4 MG SL tablet Place 1 tablet (0.4 mg total) under the tongue every 5 (five) minutes as needed for chest pain. 25 tablet 1  . pantoprazole (PROTONIX) 40 MG tablet Take 40 mg by mouth daily.    . potassium chloride (K-DUR,KLOR-CON) 10 MEQ tablet Take 10 mEq by mouth daily.     . simvastatin (ZOCOR) 20 MG tablet Take 20 mg by mouth every evening.     No current facility-administered medications for this visit.    Allergies:   Aspirin; Lipitor; Lopressor; and Penicillins    Social History:  The patient  reports that he has been smoking Cigars.  He has never used smokeless tobacco. He reports that he does not drink alcohol or use illicit drugs.   Family History:  The patient's family history includes CAD in his father; Cancer in his mother; Hyperlipidemia in his father.    ROS:  Please see the history of present illness.   Otherwise, review of systems are positive for none.   All other systems are reviewed and negative.    PHYSICAL EXAM: VS:  BP 140/82 mmHg  Pulse 71  Ht 5\' 11"  (1.803 m)  Wt 146 lb (66.225 kg)  BMI 20.37 kg/m2 , BMI Body mass index is 20.37 kg/(m^2). GEN: Well nourished, well developed, in no acute distres. Elderly    HEENT: normal Neck: no JVD, carotid bruits, or masses Cardiac: RRR; no murmurs, rubs, or gallops,no edema  Respiratory:  clear to auscultation bilaterally, normal work of breathing GI: soft, nontender, nondistended, + BS. Colostomy presents MS: no deformity or atrophy Skin: warm and dry, no rash Neuro:  Strength and sensation are intact Psych: euthymic mood, full affect   EKG:  EKG is not ordered today.   Recent Labs: 09/11/2015: ALT 10* 09/18/2015: BUN 16; Creatinine, Ser 0.64; Hemoglobin 11.4*; Platelets 427*; Potassium 3.7; Sodium 137    Lipid Panel No results found for: CHOL, TRIG, HDL, CHOLHDL, VLDL, LDLCALC, LDLDIRECT    Wt Readings from Last 3 Encounters:  10/09/15 146 lb (66.225 kg)  09/09/15 155 lb 6.8 oz (70.5 kg)  10/11/14 162 lb (73.483 kg)      Other studies Reviewed: Additional studies/ records that were reviewed today include: [place] 2012  Review of the above records demonstrates: see H&P   ASSESSMENT AND PLAN:  TYRECK BELL is a 81 y.o. male with a history of CAD s/p CABG (1993), HLD, HTN and DM who presents for post hospital follow up after admission for small bowel obstruction 2/2 nonmalignant neoplasm.   CAD/Chronic stable angina-history of prior CABG. Underwent LHC in 2012 after a high risk nuc. Dr. Tamala Julian felt there were no good PCI targets, stable anatomy and that medical Rx was his best option.  -- He had a mild trop elevation suspected due to demand ischemia during recent admission.  Peak of 0.15. No cardiac symptoms -- He has ASA allergy. Statin and BB restarted post procedure. Continue imdur 60mg  -- He describes what sounds like chronic stable angina today. He is not bothered by it. We will continue medical therapy in this 81 yo man with recent abdominal surgery.  I will refill his SL NTG. He knows to call us or go to the ED if he had any chest discomfort that does not resolve with rest or SL NTG.   HTN- BP 140/82. Continue HCTZ 25mg  and Toprol  XL 25mg    Abdominal mass -- S/p laparaotomy and sigmoid colectomy 09/11/15 and doing well  DM-  continue Metformin   Hypothryoid- continue synthroid.    Current medicines are reviewed at length with the patient today.  The patient does not have concerns regarding medicines.  The following changes have been made:  no change  Labs/ tests ordered today include:  No orders of the defined types were placed in this encounter.     Disposition:   FU with Dr Tamala Julian in ~3 months Signed, Crista Luria  10/09/2015 1:26 PM    [facility] [address], [place],   [address] Phone: [phone]; Fax: [phone]

## 2015-10-09 ENCOUNTER — Ambulatory Visit (INDEPENDENT_AMBULATORY_CARE_PROVIDER_SITE_OTHER): Payer: Medicare Other | Admitting: Physician Assistant

## 2015-10-09 ENCOUNTER — Encounter: Payer: Self-pay | Admitting: Physician Assistant

## 2015-10-09 VITALS — BP 140/82 | HR 71 | Ht 71.0 in | Wt 146.0 lb

## 2015-10-09 DIAGNOSIS — I25708 Atherosclerosis of coronary artery bypass graft(s), unspecified, with other forms of angina pectoris: Secondary | ICD-10-CM | POA: Diagnosis not present

## 2015-10-09 MED ORDER — NITROGLYCERIN 0.4 MG SL SUBL: 0.4000 mg | SUBLINGUAL_TABLET | SUBLINGUAL | Status: DC | PRN

## 2015-10-09 NOTE — Patient Instructions (Signed)
Your physician wants you to follow-up in: 3 Months with Dr Gaspar Bidding will receive a reminder letter in the mail two months in advance. If you don't receive a letter, please call our office to schedule the follow-up appointment.  If you need a refill on your cardiac medications before your next appointment, please call your pharmacy.

## 2015-10-10 ENCOUNTER — Other Ambulatory Visit: Payer: Self-pay | Admitting: Interventional Cardiology

## 2016-01-21 ENCOUNTER — Ambulatory Visit (INDEPENDENT_AMBULATORY_CARE_PROVIDER_SITE_OTHER): Payer: Medicare Other | Admitting: Interventional Cardiology

## 2016-01-21 ENCOUNTER — Encounter: Payer: Self-pay | Admitting: Interventional Cardiology

## 2016-01-21 VITALS — BP 140/64 | HR 66 | Ht 71.0 in | Wt 166.8 lb

## 2016-01-21 DIAGNOSIS — I25708 Atherosclerosis of coronary artery bypass graft(s), unspecified, with other forms of angina pectoris: Secondary | ICD-10-CM

## 2016-01-21 DIAGNOSIS — E1169 Type 2 diabetes mellitus with other specified complication: Secondary | ICD-10-CM

## 2016-01-21 DIAGNOSIS — I10 Essential (primary) hypertension: Secondary | ICD-10-CM | POA: Diagnosis not present

## 2016-01-21 DIAGNOSIS — Z9049 Acquired absence of other specified parts of digestive tract: Secondary | ICD-10-CM | POA: Diagnosis not present

## 2016-01-21 NOTE — Patient Instructions (Signed)
Medication Instructions:  Your physician recommends that you continue on your current medications as directed. Please refer to the Current Medication list given to you today.   Labwork: None ordered  Testing/Procedures: None ordered  Follow-Up: Your physician wants you to follow-up in: 6 months You will receive a reminder letter in the mail two months in advance. If you don't receive a letter, please call our office to schedule the follow-up appointment.   Any Other Special Instructions Will Be Listed Below (If Applicable). Use Nitro-glycerin as needed as directed for chest pain. Call the office if your are having tot use Nitro regularly.     If you need a refill on your cardiac medications before your next appointment, please call your pharmacy.

## 2016-01-21 NOTE — Progress Notes (Signed)
Cardiology Office Note   Date:  01/21/2016   ID:  [name], DOB [birth year], MRN [id number]  PCP:  Irven Shelling, MD  Cardiologist:  Sinclair Grooms, MD   Chief Complaint  Patient presents with  . Coronary Artery Disease      History of Present Illness: Kenneth [name] is a 82 y.o. male who presents for  Recent colectomy for diverticular disease and bleeding, CAD with prior bypass grafting, stable angina pectoris, history of anemia, hypertension, and hyperlipidemia.   appetite is improved. Gaining weight since surgery. If he has significant activity he would developed left precordial discomfort that radiates into the left arm and feels like a tingle. There is no shortness of breath orthopnea.  Past Medical History  Diagnosis Date  . Diabetes ([place])   . HTN (hypertension)   . Coronary atherosclerosis of unspecified type of vessel, native or graft     CABG 1993, LIMA to LAD, SVG to obtuse marginal one, SVG to posterior descending, stable angina  . Hyperlipidemia `  . GERD (gastroesophageal reflux disease)   . Chronic rhinitis   . Glaucoma   . Melanoma of skin, site unspecified   . Hernia     left side  . Complication of anesthesia   . PONV (postoperative nausea and vomiting)   . Anemia     on iron  . [facility] spotted fever 2-3 yrs ago  . CAD (coronary artery disease)   . Diverticulosis of colon   . Anginal pain ([place])     last 3 days ago goes away if stop and rest  . Dysrhythmia     occ "skips a beat"  . History of kidney stones   . H/O hiatal hernia   . Arthritis     Past Surgical History  Procedure Laterality Date  . Right leg fracture age 82    . Coronary artery bypass graft  1990's    x 3  . Tonsillectomy  age 29 or 41  . Colonoscopy with propofol N/A 11/27/2013    Procedure: COLONOSCOPY WITH PROPOFOL;  Surgeon: Garlan Fair, MD;  [facility];  Service: Endoscopy;  Laterality: N/A;  . Inguinal hernia repair Left  06/28/2014    Procedure: Left  INGUINAL HERNIA REPAIR ;  Surgeon: Merrie Roof, MD;  [facility];  Service: General;  Laterality: Left;  . Insertion of mesh Left 06/28/2014    Procedure: INSERTION OF MESH;  Surgeon: Merrie Roof, MD;  [facility];  Service: General;  Laterality: Left;  . Hernia repair  [phone]    lft ing hernia  . Insertion of mesh N/A 10/11/2014  . Laparoscopy  10/11/2014  . Ventral hernia repair  10/11/2014    Procedure: HERNIA REPAIR VENTRAL ADULT;  Surgeon: Autumn Messing III, MD;  Location: [place];  Service: General;;  . Laparotomy N/A 09/11/2015    Procedure: EXPLORATORY LAPAROTOMY;  Surgeon: Excell Seltzer, MD;  [facility];  Service: General;  Laterality: N/A;  . Partial colectomy N/A 09/11/2015    Procedure: SIGMOID COLECTOMY AND COLOSTOMY;  Surgeon: Excell Seltzer, MD;  [facility];  Service: General;  Laterality: N/A;     Current Outpatient Prescriptions  Medication Sig Dispense Refill  . acetaminophen (TYLENOL) 325 MG tablet Take 325 mg by mouth every 6 (six) hours as needed for mild pain.     [name] amLODipine (NORVASC) 5 MG tablet Take 5 mg by mouth daily.  1  .  Cyanocobalamin (VITAMIN B-12 PO) Take 1 tablet by mouth daily.    . ferrous sulfate 325 (65 FE) MG tablet Take 325 mg by mouth daily with breakfast.    . hydrochlorothiazide (HYDRODIURIL) 25 MG tablet Take 12.5 mg by mouth daily.   0  . ipratropium (ATROVENT) 0.03 % nasal spray Place 2 sprays into both nostrils 2 (two) times daily as needed for rhinitis.    [name] isosorbide mononitrate (IMDUR) 60 MG 24 hr tablet Take 60 mg by mouth every evening.     . latanoprost (XALATAN) 0.005 % ophthalmic solution Place 1 drop into both eyes at bedtime.    [name] levothyroxine (SYNTHROID, LEVOTHROID) 50 MCG tablet Take 50 mcg by mouth daily before breakfast.    . metFORMIN (GLUCOPHAGE) 1000 MG tablet Take 500 mg by mouth daily with breakfast.     . metoprolol succinate (TOPROL-XL) 25 MG 24 hr tablet Take  1 tablet (25 mg total) by mouth daily. 30 tablet 0  . Multiple Vitamin (MULTIVITAMIN WITH MINERALS) TABS tablet Take 1 tablet by mouth daily.    . nitroGLYCERIN (NITROSTAT) 0.4 MG SL tablet Place 1 tablet (0.4 mg total) under the tongue every 5 (five) minutes as needed for chest pain. 25 tablet 1  . pantoprazole (PROTONIX) 40 MG tablet Take 40 mg by mouth daily.    . potassium chloride (K-DUR,KLOR-CON) 10 MEQ tablet Take 10 mEq by mouth daily.     . simvastatin (ZOCOR) 20 MG tablet Take 20 mg by mouth every evening.     No current facility-administered medications for this visit.    Allergies:   Aspirin; Lipitor; Lopressor; and Penicillins    Social History:  The patient  reports that he has been smoking Cigars.  He has never used smokeless tobacco. He reports that he does not drink alcohol or use illicit drugs.   Family History:  The patient's family history includes CAD in his father; Cancer in his mother; Hyperlipidemia in his father.    ROS:  Please see the history of present illness.   Otherwise, review of systems are positive for  Appetite has improved..   Weight is increasing appropriately. All other systems are reviewed and negative.    PHYSICAL EXAM: VS:  BP 140/64 mmHg  Pulse 66  Ht 5\' 11"  (1.803 m)  Wt 166 lb 12.8 oz (75.66 kg)  BMI 23.27 kg/m2 , BMI Body mass index is 23.27 kg/(m^2). GEN: Well nourished, well developed, in no acute distress HEENT: normal Neck: no JVD, carotid bruits, or masses Cardiac:  RRR.  There is no murmur, rub, or gallop. There is no edema. Respiratory:  clear to auscultation bilaterally, normal work of breathing. GI: soft, nontender, nondistended, + BS MS: no deformity or atrophy Skin: warm and dry, no rash Neuro:  Strength and sensation are intact Psych: euthymic mood, full affect   EKG:  EKG is not ordered today   Recent Labs: 09/11/2015: ALT 10* 09/18/2015: BUN 16; Creatinine, Ser 0.64; Hemoglobin 11.4*; Platelets 427*; Potassium 3.7;  Sodium 137    Lipid Panel No results found for: CHOL, TRIG, HDL, CHOLHDL, VLDL, LDLCALC, LDLDIRECT    Wt Readings from Last 3 Encounters:  01/21/16 166 lb 12.8 oz (75.66 kg)  10/09/15 146 lb (66.225 kg)  09/09/15 155 lb 6.8 oz (70.5 kg)      Other studies Reviewed: Additional studies/ records that were reviewed today include:  Reviewed recent hospital stay for diverticular bleed.. The findings include  Echo during hospital stay was unremarkable.[name]  ASSESSMENT AND PLAN:  1. Coronary artery disease involving coronary bypass graft with other forms of angina pectoris ([place])  significant for angina with excessive physical activity. Described as left precordial with left arm radiation. Goes away 10-30 minutes after resting. Stable for the last 12 months.  2. Essential hypertension  controlled  3. Type 2 diabetes mellitus with other specified complication ([place])  followed by primary care  4. S/P partial colectomy  has colostomy. Looking forward to colostomy take down.    Current medicines are reviewed at length with the patient today.  The patient has the following concerns regarding medicines: none.  The following changes/actions have been instituted:     Essentially stable and doing better now 2 months post colectomy. Overall doing wel   Unless change in anginal symptoms, will be cleared for repeat operation with Dr. Excell Seltzer. He will not require further cardiac w/u unless change/acceleration is symptoms.   Six-month follow-up   Use nitroglycerin if prolonged discomfort  Labs/ tests ordered today include:  No orders of the defined types were placed in this encounter.     Disposition:   FU with HS in 6 year  Signed, Sinclair Grooms, MD  01/21/2016 11:09 AM    [address] [place], [place], [place]  [address] Phone: [phone]; Fax: [phone]

## 2016-02-11 ENCOUNTER — Other Ambulatory Visit: Payer: Self-pay | Admitting: General Surgery

## 2016-02-13 ENCOUNTER — Other Ambulatory Visit: Payer: Self-pay | Admitting: General Surgery

## 2016-02-13 DIAGNOSIS — Z933 Colostomy status: Secondary | ICD-10-CM

## 2016-02-20 ENCOUNTER — Ambulatory Visit
Admission: RE | Admit: 2016-02-20 | Discharge: 2016-02-20 | Disposition: A | Discharge status: Home / Self Care | Payer: Medicare Other | Source: Ambulatory Visit | Attending: General Surgery | Admitting: General Surgery

## 2016-02-20 DIAGNOSIS — Z933 Colostomy status: Secondary | ICD-10-CM

## 2016-03-10 ENCOUNTER — Encounter (HOSPITAL_COMMUNITY): Payer: Self-pay

## 2016-03-10 ENCOUNTER — Encounter (HOSPITAL_COMMUNITY)
Admission: RE | Admit: 2016-03-10 | Discharge: 2016-03-10 | Disposition: A | Discharge status: Home / Self Care | Payer: Medicare Other | Source: Ambulatory Visit | Attending: General Surgery | Admitting: General Surgery

## 2016-03-10 DIAGNOSIS — Z01812 Encounter for preprocedural laboratory examination: Secondary | ICD-10-CM | POA: Insufficient documentation

## 2016-03-10 DIAGNOSIS — Z933 Colostomy status: Secondary | ICD-10-CM | POA: Insufficient documentation

## 2016-03-10 DIAGNOSIS — Z0183 Encounter for blood typing: Secondary | ICD-10-CM | POA: Diagnosis not present

## 2016-03-10 HISTORY — DX: Anxiety disorder, unspecified: F41.9

## 2016-03-10 HISTORY — DX: Personal history of other diseases of the musculoskeletal system and connective tissue: Z87.39

## 2016-03-10 HISTORY — DX: Anesthesia of skin: R20.0

## 2016-03-10 HISTORY — DX: Dizziness and giddiness: R42

## 2016-03-10 HISTORY — DX: Sleep disorder, unspecified: G47.9

## 2016-03-10 HISTORY — DX: Disorder of thyroid, unspecified: E07.9

## 2016-03-10 HISTORY — DX: Chronic cough: R05.3

## 2016-03-10 HISTORY — DX: Cough: R05

## 2016-03-10 LAB — CBC
HEMATOCRIT: 36 % — AB (ref 39.0–52.0)
Hemoglobin: 11.9 g/dL — ABNORMAL LOW (ref 13.0–17.0)
MCH: 31.1 pg (ref 26.0–34.0)
MCHC: 33.1 g/dL (ref 30.0–36.0)
MCV: 94 fL (ref 78.0–100.0)
PLATELETS: 356 10*3/uL (ref 150–400)
RBC: 3.83 MIL/uL — ABNORMAL LOW (ref 4.22–5.81)
RDW: 13.2 % (ref 11.5–15.5)
WBC: 8.3 10*3/uL (ref 4.0–10.5)

## 2016-03-10 LAB — BASIC METABOLIC PANEL
Anion gap: 8 (ref 5–15)
BUN: 18 mg/dL (ref 6–20)
CO2: 27 mmol/L (ref 22–32)
CREATININE: 0.79 mg/dL (ref 0.61–1.24)
Calcium: 9.3 mg/dL (ref 8.9–10.3)
Chloride: 105 mmol/L (ref 101–111)
GFR calc Af Amer: >60 mL/min (ref >60)
GLUCOSE: 161 mg/dL — AB (ref 65–99)
POTASSIUM: 4.3 mmol/L (ref 3.5–5.1)
SODIUM: 140 mmol/L (ref 135–145)

## 2016-03-10 NOTE — Patient Instructions (Addendum)
YOUR PROCEDURE IS SCHEDULED ON : 03/16/16  REPORT TO [place] MAIN ENTRANCE FOLLOW SIGNS TO EAST ELEVATOR - GO TO 3rd FLOOR CHECK IN AT 3 EAST NURSES STATION (SHORT STAY) AT:  7:45 AM  CALL THIS NUMBER IF YOU HAVE PROBLEMS THE MORNING OF SURGERY [phone]  REMEMBER:ONLY 1 PER PERSON MAY GO TO SHORT STAY WITH YOU TO GET READY THE MORNING OF YOUR SURGERY  DO NOT EAT FOOD OR DRINK LIQUIDS AFTER MIDNIGHT  TAKE THESE MEDICINES THE MORNING OF SURGERY: METOPROLOL / LEVOTHYROXINE / PANTOPRAZOLE  STOP ASPIRIN / IBUPROFEN / ALEVE / VITAMINS / HERBAL MEDS __7__ DAYS BEFORE SURGERY  YOU MAY NOT HAVE ANY METAL ON YOUR BODY INCLUDING HAIR PINS AND PIERCING'S. DO NOT WEAR JEWELRY, MAKEUP, LOTIONS, POWDERS OR PERFUMES. DO NOT WEAR NAIL POLISH. DO NOT SHAVE 48 HRS PRIOR TO SURGERY. MEN MAY SHAVE FACE AND NECK.  DO NOT [place]. [facility] IS NOT RESPONSIBLE FOR VALUABLES.  CONTACTS, DENTURES OR PARTIALS MAY NOT BE WORN TO SURGERY. LEAVE SUITCASE IN CAR. CAN BE BROUGHT TO ROOM AFTER SURGERY.  PATIENTS DISCHARGED THE DAY OF SURGERY WILL NOT BE ALLOWED TO DRIVE HOME.  PLEASE READ OVER THE FOLLOWING INSTRUCTION SHEETS _________________________________________________________________________________                                           - PREPARING FOR SURGERY  Before surgery, you can play an important role.  Because skin is not sterile, your skin needs to be as free of germs as possible.  You can reduce the number of germs on your skin by washing with CHG (chlorahexidine gluconate) soap before surgery.  CHG is an antiseptic cleaner which kills germs and bonds with the skin to continue killing germs even after washing. Please DO NOT use if you have an allergy to CHG or antibacterial soaps.  If your skin becomes reddened/irritated stop using the CHG and inform your nurse when you arrive at Short Stay. Do not shave (including legs and underarms)  for at least 48 hours prior to the first CHG shower.  You may shave your face. Please follow these instructions carefully:   1.  Shower with CHG Soap the night before surgery and the  morning of Surgery.   2.  If you choose to wash your hair, wash your hair first as usual with your  normal  Shampoo.   3.  After you shampoo, rinse your hair and body thoroughly to remove the  shampoo.                                         4.  Use CHG as you would any other liquid soap.  You can apply chg directly  to the skin and wash . Gently wash with scrungie or clean wascloth    5.  Apply the CHG Soap to your body ONLY FROM THE NECK DOWN.   Do not use on open                           Wound or open sores. Avoid contact with eyes, ears mouth and genitals (private parts).  Genitals (private parts) with your normal soap.              6.  Wash thoroughly, paying special attention to the area where your surgery  will be performed.   7.  Thoroughly rinse your body with warm water from the neck down.   8.  DO NOT shower/wash with your normal soap after using and rinsing off  the CHG Soap .                9.  Pat yourself dry with a clean towel.             10.  Wear clean night clothes to bed after shower             11.  Place clean sheets on your bed the night of your first shower and do not  sleep with pets.  Day of Surgery : Do not apply any lotions/deodorants the morning of surgery.  Please wear clean clothes to the hospital/surgery center.  FAILURE TO FOLLOW THESE INSTRUCTIONS MAY RESULT IN THE CANCELLATION OF YOUR SURGERY    PATIENT SIGNATURE_________________________________  ______________________________________________________________________

## 2016-03-11 LAB — HEMOGLOBIN A1C
Hgb A1c MFr Bld: 6.7 % — ABNORMAL HIGH (ref 4.8–5.6)
Mean Plasma Glucose: 146 mg/dL

## 2016-03-15 MED ORDER — CLINDAMYCIN PHOSPHATE 900 MG/50ML IV SOLN
900.0000 mg | INTRAVENOUS | Status: AC
  Administered 2016-03-16: 900 mg via INTRAVENOUS

## 2016-03-15 MED ORDER — GENTAMICIN SULFATE 40 MG/ML IJ SOLN
5.0000 mg/kg | INTRAVENOUS | Status: AC
  Administered 2016-03-16: 380 mg via INTRAVENOUS
  Filled 2016-03-15: qty 9.5

## 2016-03-16 ENCOUNTER — Inpatient Hospital Stay (HOSPITAL_COMMUNITY): Payer: Medicare Other | Admitting: Anesthesiology

## 2016-03-16 ENCOUNTER — Encounter (HOSPITAL_COMMUNITY): Payer: Self-pay | Admitting: *Deleted

## 2016-03-16 ENCOUNTER — Encounter (HOSPITAL_COMMUNITY)
Admission: RE | Disposition: A | Discharge status: Home / Self Care | Payer: Self-pay | Source: Ambulatory Visit | Attending: General Surgery

## 2016-03-16 ENCOUNTER — Inpatient Hospital Stay (HOSPITAL_COMMUNITY)
Admission: RE | Admit: 2016-03-16 | Discharge: 2016-03-22 | DRG: 331 | Disposition: A | Discharge status: Home / Self Care | Payer: Medicare Other | Source: Ambulatory Visit | Attending: General Surgery | Admitting: General Surgery

## 2016-03-16 DIAGNOSIS — Z01812 Encounter for preprocedural laboratory examination: Secondary | ICD-10-CM

## 2016-03-16 DIAGNOSIS — K660 Peritoneal adhesions (postprocedural) (postinfection): Secondary | ICD-10-CM | POA: Diagnosis present

## 2016-03-16 DIAGNOSIS — Z87891 Personal history of nicotine dependence: Secondary | ICD-10-CM | POA: Diagnosis not present

## 2016-03-16 DIAGNOSIS — E119 Type 2 diabetes mellitus without complications: Secondary | ICD-10-CM | POA: Diagnosis present

## 2016-03-16 DIAGNOSIS — Z951 Presence of aortocoronary bypass graft: Secondary | ICD-10-CM

## 2016-03-16 DIAGNOSIS — Z433 Encounter for attention to colostomy: Principal | ICD-10-CM

## 2016-03-16 DIAGNOSIS — Z803 Family history of malignant neoplasm of breast: Secondary | ICD-10-CM | POA: Diagnosis not present

## 2016-03-16 DIAGNOSIS — Z933 Colostomy status: Secondary | ICD-10-CM

## 2016-03-16 DIAGNOSIS — Z79899 Other long term (current) drug therapy: Secondary | ICD-10-CM

## 2016-03-16 HISTORY — PX: COLOSTOMY TAKEDOWN: SHX5783

## 2016-03-16 LAB — TYPE AND SCREEN
ABO/RH(D): A POS
ANTIBODY SCREEN: NEGATIVE

## 2016-03-16 LAB — GLUCOSE, CAPILLARY
GLUCOSE-CAPILLARY: 148 mg/dL — AB (ref 65–99)
GLUCOSE-CAPILLARY: 168 mg/dL — AB (ref 65–99)
GLUCOSE-CAPILLARY: 233 mg/dL — AB (ref 65–99)
Glucose-Capillary: 202 mg/dL — ABNORMAL HIGH (ref 65–99)

## 2016-03-16 SURGERY — CLOSURE, COLOSTOMY
Anesthesia: General | Site: Abdomen

## 2016-03-16 MED ORDER — ALVIMOPAN 12 MG PO CAPS
12.0000 mg | ORAL_CAPSULE | Freq: Once | ORAL | Status: AC
  Administered 2016-03-16: 12 mg via ORAL
  Filled 2016-03-16: qty 1

## 2016-03-16 MED ORDER — CETYLPYRIDINIUM CHLORIDE 0.05 % MT LIQD
7.0000 mL | Freq: Two times a day (BID) | OROMUCOSAL | Status: DC
  Administered 2016-03-16 – 2016-03-20 (×7): 7 mL via OROMUCOSAL

## 2016-03-16 MED ORDER — ALBUTEROL SULFATE HFA 108 (90 BASE) MCG/ACT IN AERS
INHALATION_SPRAY | RESPIRATORY_TRACT | Status: AC
  Filled 2016-03-16: qty 6.7

## 2016-03-16 MED ORDER — LATANOPROST 0.005 % OP SOLN
1.0000 [drp] | Freq: Every day | OPHTHALMIC | Status: DC
  Administered 2016-03-16 – 2016-03-21 (×6): 1 [drp] via OPHTHALMIC
  Filled 2016-03-16: qty 2.5

## 2016-03-16 MED ORDER — AMLODIPINE BESYLATE 10 MG PO TABS
10.0000 mg | ORAL_TABLET | Freq: Every day | ORAL | Status: DC
  Administered 2016-03-16 – 2016-03-22 (×7): 10 mg via ORAL
  Filled 2016-03-16 (×7): qty 1

## 2016-03-16 MED ORDER — INSULIN ASPART 100 UNIT/ML ~~LOC~~ SOLN
0.0000 [IU] | SUBCUTANEOUS | Status: DC
  Administered 2016-03-16 (×2): 3 [IU] via SUBCUTANEOUS
  Administered 2016-03-17 (×5): 1 [IU] via SUBCUTANEOUS
  Administered 2016-03-17 – 2016-03-18 (×2): 2 [IU] via SUBCUTANEOUS
  Administered 2016-03-18 (×2): 1 [IU] via SUBCUTANEOUS
  Administered 2016-03-18 – 2016-03-19 (×2): 2 [IU] via SUBCUTANEOUS
  Administered 2016-03-19 (×2): 1 [IU] via SUBCUTANEOUS
  Administered 2016-03-19: 2 [IU] via SUBCUTANEOUS
  Administered 2016-03-20 – 2016-03-21 (×4): 1 [IU] via SUBCUTANEOUS
  Administered 2016-03-21: 3 [IU] via SUBCUTANEOUS
  Administered 2016-03-21 – 2016-03-22 (×3): 1 [IU] via SUBCUTANEOUS

## 2016-03-16 MED ORDER — ONDANSETRON HCL 4 MG/2ML IJ SOLN
INTRAMUSCULAR | Status: DC | PRN
  Administered 2016-03-16 (×4): 2 mg via INTRAVENOUS

## 2016-03-16 MED ORDER — HYDROMORPHONE HCL 1 MG/ML IJ SOLN
INTRAMUSCULAR | Status: AC
  Filled 2016-03-16: qty 1

## 2016-03-16 MED ORDER — PROPOFOL 10 MG/ML IV BOLUS
INTRAVENOUS | Status: DC | PRN
  Administered 2016-03-16: 140 mg via INTRAVENOUS
  Administered 2016-03-16: 30 mg via INTRAVENOUS

## 2016-03-16 MED ORDER — LIDOCAINE HCL (CARDIAC) 20 MG/ML IV SOLN
INTRAVENOUS | Status: AC
  Filled 2016-03-16: qty 5

## 2016-03-16 MED ORDER — FENTANYL CITRATE (PF) 250 MCG/5ML IJ SOLN
INTRAMUSCULAR | Status: AC
  Filled 2016-03-16: qty 5

## 2016-03-16 MED ORDER — SUGAMMADEX SODIUM 200 MG/2ML IV SOLN
INTRAVENOUS | Status: AC
  Filled 2016-03-16: qty 2

## 2016-03-16 MED ORDER — MORPHINE SULFATE (PF) 2 MG/ML IV SOLN
2.0000 mg | INTRAVENOUS | Status: DC | PRN
  Administered 2016-03-16: 2 mg via INTRAVENOUS
  Administered 2016-03-16: 4 mg via INTRAVENOUS
  Administered 2016-03-16 (×2): 2 mg via INTRAVENOUS
  Filled 2016-03-16 (×3): qty 1
  Filled 2016-03-16: qty 2

## 2016-03-16 MED ORDER — PANTOPRAZOLE SODIUM 40 MG PO TBEC
40.0000 mg | DELAYED_RELEASE_TABLET | Freq: Every day | ORAL | Status: DC
  Administered 2016-03-16 – 2016-03-22 (×7): 40 mg via ORAL
  Filled 2016-03-16 (×7): qty 1

## 2016-03-16 MED ORDER — IPRATROPIUM BROMIDE 0.03 % NA SOLN
2.0000 | Freq: Two times a day (BID) | NASAL | Status: DC | PRN
  Filled 2016-03-16: qty 30

## 2016-03-16 MED ORDER — DEXAMETHASONE SODIUM PHOSPHATE 10 MG/ML IJ SOLN
INTRAMUSCULAR | Status: DC | PRN
  Administered 2016-03-16: 5 mg via INTRAVENOUS

## 2016-03-16 MED ORDER — SIMVASTATIN 20 MG PO TABS
20.0000 mg | ORAL_TABLET | Freq: Every evening | ORAL | Status: DC
  Administered 2016-03-16 – 2016-03-21 (×6): 20 mg via ORAL
  Filled 2016-03-16 (×7): qty 1

## 2016-03-16 MED ORDER — SUGAMMADEX SODIUM 200 MG/2ML IV SOLN
INTRAVENOUS | Status: DC | PRN
  Administered 2016-03-16: 200 mg via INTRAVENOUS

## 2016-03-16 MED ORDER — CHLORHEXIDINE GLUCONATE CLOTH 2 % EX PADS: 6.0000 | MEDICATED_PAD | Freq: Once | CUTANEOUS | Status: DC

## 2016-03-16 MED ORDER — EPHEDRINE SULFATE 50 MG/ML IJ SOLN
INTRAMUSCULAR | Status: AC
  Filled 2016-03-16: qty 1

## 2016-03-16 MED ORDER — POTASSIUM CHLORIDE IN NACL 20-0.9 MEQ/L-% IV SOLN
INTRAVENOUS | Status: DC
  Administered 2016-03-16 – 2016-03-21 (×7): via INTRAVENOUS
  Filled 2016-03-16 (×12): qty 1000

## 2016-03-16 MED ORDER — LIDOCAINE HCL (CARDIAC) 20 MG/ML IV SOLN
INTRAVENOUS | Status: DC | PRN
  Administered 2016-03-16: 80 mg via INTRAVENOUS

## 2016-03-16 MED ORDER — HYDROCHLOROTHIAZIDE 12.5 MG PO CAPS
12.5000 mg | ORAL_CAPSULE | Freq: Every day | ORAL | Status: DC
  Administered 2016-03-16 – 2016-03-22 (×7): 12.5 mg via ORAL
  Filled 2016-03-16 (×7): qty 1

## 2016-03-16 MED ORDER — DEXAMETHASONE SODIUM PHOSPHATE 10 MG/ML IJ SOLN
INTRAMUSCULAR | Status: AC
  Filled 2016-03-16: qty 1

## 2016-03-16 MED ORDER — MORPHINE SULFATE (PF) 2 MG/ML IV SOLN
2.0000 mg | INTRAVENOUS | Status: DC | PRN
  Administered 2016-03-17: 4 mg via INTRAVENOUS
  Administered 2016-03-19 – 2016-03-20 (×4): 2 mg via INTRAVENOUS
  Filled 2016-03-16: qty 2
  Filled 2016-03-16 (×4): qty 1

## 2016-03-16 MED ORDER — 0.9 % SODIUM CHLORIDE (POUR BTL) OPTIME
TOPICAL | Status: DC | PRN
  Administered 2016-03-16: 5000 mL

## 2016-03-16 MED ORDER — FENTANYL CITRATE (PF) 100 MCG/2ML IJ SOLN
INTRAMUSCULAR | Status: DC | PRN
  Administered 2016-03-16 (×2): 50 ug via INTRAVENOUS
  Administered 2016-03-16: 100 ug via INTRAVENOUS
  Administered 2016-03-16 (×3): 50 ug via INTRAVENOUS

## 2016-03-16 MED ORDER — METOPROLOL SUCCINATE ER 25 MG PO TB24
25.0000 mg | ORAL_TABLET | Freq: Every day | ORAL | Status: DC
  Administered 2016-03-16 – 2016-03-21 (×6): 25 mg via ORAL
  Filled 2016-03-16 (×7): qty 1

## 2016-03-16 MED ORDER — METOPROLOL TARTRATE 1 MG/ML IV SOLN
5.0000 mg | Freq: Four times a day (QID) | INTRAVENOUS | Status: DC | PRN
Start: 2016-03-16 — End: 2016-03-22

## 2016-03-16 MED ORDER — PROPOFOL 10 MG/ML IV BOLUS
INTRAVENOUS | Status: AC
  Filled 2016-03-16: qty 20

## 2016-03-16 MED ORDER — ENOXAPARIN SODIUM 40 MG/0.4ML ~~LOC~~ SOLN
40.0000 mg | SUBCUTANEOUS | Status: DC
  Administered 2016-03-17 – 2016-03-22 (×6): 40 mg via SUBCUTANEOUS
  Filled 2016-03-16 (×8): qty 0.4

## 2016-03-16 MED ORDER — FENTANYL CITRATE (PF) 100 MCG/2ML IJ SOLN
INTRAMUSCULAR | Status: AC
  Filled 2016-03-16: qty 2

## 2016-03-16 MED ORDER — HYDROCHLOROTHIAZIDE 25 MG PO TABS
12.5000 mg | ORAL_TABLET | Freq: Every day | ORAL | Status: DC
  Filled 2016-03-16: qty 0.5

## 2016-03-16 MED ORDER — LEVOTHYROXINE SODIUM 50 MCG PO TABS
50.0000 ug | ORAL_TABLET | Freq: Every day | ORAL | Status: DC
  Administered 2016-03-17 – 2016-03-22 (×6): 50 ug via ORAL
  Filled 2016-03-16 (×8): qty 1

## 2016-03-16 MED ORDER — ROCURONIUM BROMIDE 100 MG/10ML IV SOLN
INTRAVENOUS | Status: AC
Start: 2016-03-16 — End: 2016-03-16
  Filled 2016-03-16: qty 1

## 2016-03-16 MED ORDER — ONDANSETRON HCL 4 MG PO TABS: 4.0000 mg | ORAL_TABLET | Freq: Four times a day (QID) | ORAL | Status: DC | PRN

## 2016-03-16 MED ORDER — LACTATED RINGERS IV SOLN
INTRAVENOUS | Status: DC
  Administered 2016-03-16 (×2): via INTRAVENOUS
  Administered 2016-03-16: 1000 mL via INTRAVENOUS

## 2016-03-16 MED ORDER — GLYCOPYRROLATE 0.2 MG/ML IJ SOLN
INTRAMUSCULAR | Status: DC | PRN
  Administered 2016-03-16: 0.1 mg via INTRAVENOUS

## 2016-03-16 MED ORDER — HYDROCODONE-ACETAMINOPHEN 5-325 MG PO TABS
1.0000 | ORAL_TABLET | ORAL | Status: DC | PRN
  Administered 2016-03-17 (×4): 2 via ORAL
  Administered 2016-03-18: 1 via ORAL
  Administered 2016-03-18 (×2): 2 via ORAL
  Administered 2016-03-19: 1 via ORAL
  Administered 2016-03-19 (×2): 2 via ORAL
  Administered 2016-03-19: 1 via ORAL
  Administered 2016-03-20 – 2016-03-21 (×3): 2 via ORAL
  Filled 2016-03-16 (×11): qty 2
  Filled 2016-03-16: qty 1
  Filled 2016-03-16 (×2): qty 2

## 2016-03-16 MED ORDER — POTASSIUM CHLORIDE CRYS ER 10 MEQ PO TBCR
10.0000 meq | EXTENDED_RELEASE_TABLET | Freq: Every day | ORAL | Status: DC
  Administered 2016-03-16 – 2016-03-22 (×7): 10 meq via ORAL
  Filled 2016-03-16 (×7): qty 1

## 2016-03-16 MED ORDER — HYDROMORPHONE HCL 1 MG/ML IJ SOLN
0.2500 mg | INTRAMUSCULAR | Status: DC | PRN
  Administered 2016-03-16 (×4): 0.5 mg via INTRAVENOUS

## 2016-03-16 MED ORDER — ISOSORBIDE MONONITRATE ER 60 MG PO TB24
60.0000 mg | ORAL_TABLET | Freq: Every evening | ORAL | Status: DC
  Administered 2016-03-16 – 2016-03-21 (×6): 60 mg via ORAL
  Filled 2016-03-16 (×7): qty 1

## 2016-03-16 MED ORDER — GLYCOPYRROLATE 0.2 MG/ML IJ SOLN
INTRAMUSCULAR | Status: AC
  Filled 2016-03-16: qty 1

## 2016-03-16 MED ORDER — SODIUM CHLORIDE 0.9 % IJ SOLN
INTRAMUSCULAR | Status: AC
  Filled 2016-03-16: qty 10

## 2016-03-16 MED ORDER — PROPOFOL 500 MG/50ML IV EMUL
INTRAVENOUS | Status: DC | PRN
  Administered 2016-03-16: 50 ug/kg/min via INTRAVENOUS

## 2016-03-16 MED ORDER — ROCURONIUM BROMIDE 100 MG/10ML IV SOLN
INTRAVENOUS | Status: DC | PRN
  Administered 2016-03-16: 50 mg via INTRAVENOUS
  Administered 2016-03-16: 10 mg via INTRAVENOUS

## 2016-03-16 MED ORDER — ALVIMOPAN 12 MG PO CAPS
12.0000 mg | ORAL_CAPSULE | Freq: Two times a day (BID) | ORAL | Status: DC
  Administered 2016-03-17 – 2016-03-21 (×9): 12 mg via ORAL
  Filled 2016-03-16 (×12): qty 1

## 2016-03-16 MED ORDER — ONDANSETRON HCL 4 MG/2ML IJ SOLN
4.0000 mg | Freq: Four times a day (QID) | INTRAMUSCULAR | Status: DC | PRN
  Administered 2016-03-18 – 2016-03-19 (×4): 4 mg via INTRAVENOUS
  Filled 2016-03-16 (×4): qty 2

## 2016-03-16 MED ORDER — ONDANSETRON HCL 4 MG/2ML IJ SOLN
INTRAMUSCULAR | Status: AC
  Filled 2016-03-16: qty 10

## 2016-03-16 MED ORDER — CLINDAMYCIN PHOSPHATE 900 MG/50ML IV SOLN
INTRAVENOUS | Status: AC
  Filled 2016-03-16: qty 50

## 2016-03-16 MED ORDER — NITROGLYCERIN 0.4 MG SL SUBL: 0.4000 mg | SUBLINGUAL_TABLET | SUBLINGUAL | Status: DC | PRN

## 2016-03-16 SURGICAL SUPPLY — 47 items
BLADE EXTENDED COATED 6.5IN (ELECTRODE) IMPLANT
CELLS DAT CNTRL 66122 CELL SVR (MISCELLANEOUS) ×1 IMPLANT
COUNTER NEEDLE 20 DBL MAG RED (NEEDLE) ×3 IMPLANT
COVER MAYO STAND STRL (DRAPES) ×9 IMPLANT
COVER SURGICAL LIGHT HANDLE (MISCELLANEOUS) ×6 IMPLANT
DRAPE LAPAROSCOPIC ABDOMINAL (DRAPES) ×3 IMPLANT
DRAPE SHEET LG 3/4 BI-LAMINATE (DRAPES) IMPLANT
DRAPE UTILITY XL STRL (DRAPES) IMPLANT
DRAPE WARM FLUID 44X44 (DRAPE) IMPLANT
DRSG OPSITE POSTOP 4X10 (GAUZE/BANDAGES/DRESSINGS) ×2 IMPLANT
DRSG OPSITE POSTOP 4X6 (GAUZE/BANDAGES/DRESSINGS) ×2 IMPLANT
DRSG OPSITE POSTOP 4X8 (GAUZE/BANDAGES/DRESSINGS) IMPLANT
ELECT PENCIL ROCKER SW 15FT (MISCELLANEOUS) ×6 IMPLANT
ELECT REM PT RETURN 15FT ADLT (MISCELLANEOUS) ×3 IMPLANT
GAUZE SPONGE 4X4 12PLY STRL (GAUZE/BANDAGES/DRESSINGS) IMPLANT
GLOVE BIOGEL PI IND STRL 7.5 (GLOVE) ×2 IMPLANT
GLOVE BIOGEL PI INDICATOR 7.5 (GLOVE) ×4
GLOVE ECLIPSE 7.5 STRL STRAW (GLOVE) ×6 IMPLANT
GOWN STRL REUS W/TWL XL LVL3 (GOWN DISPOSABLE) ×18 IMPLANT
HANDLE SUCTION POOLE (INSTRUMENTS) IMPLANT
LEGGING LITHOTOMY PAIR STRL (DRAPES) ×3 IMPLANT
LIGASURE IMPACT 36 18CM CVD LR (INSTRUMENTS) IMPLANT
LUBRICANT JELLY K Y 4OZ (MISCELLANEOUS) ×3 IMPLANT
PACK COLON (CUSTOM PROCEDURE TRAY) ×3 IMPLANT
RETRACTOR WND ALEXIS 18 MED (MISCELLANEOUS) ×1 IMPLANT
RTRCTR WOUND ALEXIS 18CM MED (MISCELLANEOUS) ×3
STAPLER VISISTAT 35W (STAPLE) IMPLANT
SUCTION POOLE HANDLE (INSTRUMENTS)
SUT CHROMIC 2 0 SH (SUTURE) ×4 IMPLANT
SUT CHROMIC 3 0 SH 27 (SUTURE) ×4 IMPLANT
SUT NOV 1 T60/GS (SUTURE) IMPLANT
SUT NOVA 1 T20/GS 25DT (SUTURE) IMPLANT
SUT NOVA NAB DX-16 0-1 5-0 T12 (SUTURE) ×4 IMPLANT
SUT NOVA T20/GS 25 (SUTURE) IMPLANT
SUT PDS AB 1 CTX 36 (SUTURE) IMPLANT
SUT PDS AB 1 TP1 96 (SUTURE) ×4 IMPLANT
SUT SILK 2 0 (SUTURE) ×3
SUT SILK 2 0 SH CR/8 (SUTURE) ×11 IMPLANT
SUT SILK 2-0 18XBRD TIE 12 (SUTURE) ×1 IMPLANT
SUT SILK 3 0 (SUTURE) ×3
SUT SILK 3 0 SH CR/8 (SUTURE) ×3 IMPLANT
SUT SILK 3-0 18XBRD TIE 12 (SUTURE) ×1 IMPLANT
TOWEL OR 17X26 10 PK STRL BLUE (TOWEL DISPOSABLE) ×3 IMPLANT
TOWEL OR NON WOVEN STRL DISP B (DISPOSABLE) ×3 IMPLANT
TRAY FOLEY W/METER SILVER 14FR (SET/KITS/TRAYS/PACK) IMPLANT
TRAY FOLEY W/METER SILVER 16FR (SET/KITS/TRAYS/PACK) IMPLANT
YANKAUER SUCT BULB TIP 10FT TU (MISCELLANEOUS) ×3 IMPLANT

## 2016-03-16 NOTE — Anesthesia Procedure Notes (Signed)
Procedure Name: Intubation Date/Time: 03/16/2016 10:37 AM Performed by: Freddie Breech Pre-anesthesia Checklist: Emergency Drugs available, Suction available, Patient being monitored and Timeout performed Patient Re-evaluated:Patient Re-evaluated prior to inductionOxygen Delivery Method: Circle system utilized Preoxygenation: Pre-oxygenation with 100% oxygen Intubation Type: IV induction Ventilation: Mask ventilation without difficulty Laryngoscope Size: Mac and 4 Grade View: Grade II Tube type: Oral Tube size: 7.0 mm Number of attempts: 2 (1 attempt via EMS student) Airway Equipment and Method: Patient positioned with wedge pillow and Stylet Placement Confirmation: ETT inserted through vocal cords under direct vision,  positive ETCO2,  CO2 detector and breath sounds checked- equal and bilateral Secured at: 22 cm Tube secured with: Tape Dental Injury: Teeth and Oropharynx as per pre-operative assessment

## 2016-03-16 NOTE — Anesthesia Postprocedure Evaluation (Signed)
Anesthesia Post Note  Patient: Kenneth [name]  Procedure(s) Performed: Procedure(s) (LRB): COLOSTOMY TAKEDOWN (N/A)  Patient location during evaluation: PACU Anesthesia Type: General Level of consciousness: sedated Pain management: pain level controlled Vital Signs Assessment: post-procedure vital signs reviewed and stable Respiratory status: spontaneous breathing and respiratory function stable Cardiovascular status: stable Anesthetic complications: no    Last Vitals:  Filed Vitals:   03/16/16 0736 03/16/16 1304  BP: 144/59 167/69  Pulse: 62 64  Temp: 36.5 C 36.6 C  Resp: 18 14    Last Pain:  Filed Vitals:   03/16/16 1346  PainSc: 8                  Hillary Schwegler DANIEL

## 2016-03-16 NOTE — Interval H&P Note (Signed)
History and Physical Interval Note:  03/16/2016 8:58 AM  Kenneth [name]  has presented today for surgery, with the diagnosis of colostomy  The various methods of treatment have been discussed with the patient and family. After consideration of risks, benefits and other options for treatment, the patient has consented to  Procedure(s): COLOSTOMY TAKEDOWN (N/A) as a surgical intervention .  The patient's history has been reviewed, patient examined, no change in status, stable for surgery.  I have reviewed the patient's chart and labs.  Questions were answered to the patient's satisfaction.     Farra Nikolic T

## 2016-03-16 NOTE — Op Note (Signed)
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 He has done well postoperatively and strongly desires colostomy takedown. I discussed alternatives with him and the nature of the surgery and risks detailed elsewhere and he desires to proceed. He has undergone a mechanical and antibiotic bowel prep at home.    Procedure Detail:  Patient was brought to the operating room, placed in the supine position on the operating table, and general endotracheal anesthesia induced. Sterile technique I closed the ablative his ostomy with a pursestring silk suture. He was carefully placed in padded lithotomy position. The abdomen and perineum were widely sterilely prepped and draped after placement of a Foley catheter. He received preoperative IV antibiotics. Patient timeout was performed and correct procedure verified. The previous midline incision was used and dissection carried down through the subcutaneous tissue and midline fascia using cautery. The peritoneum was carefully entered under direct vision. There were some omental adhesions to the midline incision were carefully taken down with cautery and sharp dissection. As the free peritoneal cavity was entered there were fortunately mild adhesions. There were a number of bandlike adhesions and short areas of adhesions between small bowel were all taken down. The left colon was mobilized up to the ostomy. I then elliptically excised the ostomy and a small amount of surrounding skin and dissection was carried down through the subcutaneous tissue and the fascia and then the ostomy reduced into the abdomen and completely detached from the abdominal  wall. There was as already known significant diverticulosis of the entire colon but it was noninflamed and generally healthy-appearing bowel. There was a long remaining rectosigmoid stump that came up to the level of the wound. Preoperative barium enema showed only a few tics right at the end of this and as we went just distally 4-5 cm the bowel was soft and very normal in appearance.  There was plenty of length for anastomosis and I elected to perform a handsewn anastomosis at this level. A healthy point of the left colon was chosen 7 or 8 cm back from the ostomy and was cleaned of mesentery and pericolic fat and held with 2 stay sutures of silk and the bowel divided. There was good bleeding edges of healthy-appearing bowel. The distal sigmoid was also cleaned of pericolic fat and mesentery back to healthy bowel several centimeters and then similarly secured and divided. A 2 layer handsewn anastomosis was then performed with a posterior row seromuscular 2-0 silk, a circumferential full-thickness layer of 2-0 chromic locking sutures and an anterior row seromuscular 2-0 silk. There was no tension and very good blood supply and a widely patent anastomosis. At this point all gloves gowns and instruments and drapes were changed. The abdomen was thoroughly irrigated and hemostasis assured. I closed the mesenteric defect with interrupted 2-0 silk's. The viscera returned to their anatomic position. The fascia at the ostomy site was closed with interrupted #1 Novafil transversely. The midline fascia which appeared very healthy was closed with running looped #1 PDS begun at either end of the incision and tied centrally. Subcutaneous taste tissue was irrigated and both incisions closed with staples. Sponge needle and instrument counts were correct.    Findings: As above  Estimated Blood Loss:  less than 100 mL  Drains: none  Blood Given: none          Specimens: Colostomy with portion of left colon and  portion of rectosigmoid        Complications:  * No complications entered in OR log *         Disposition: PACU - hemodynamically stable.         Condition: stable

## 2016-03-16 NOTE — H&P (Signed)
History of Present Illness  The patient is a 82 year old male presenting for a post-operative visit. He returns 5 months following emergency Hartman colectomy for complete sigmoid obstruction which pathologically proved to be secondary to diverticulitis. He has improved significantly. He is up and around and active and doing as well as he was doing before the surgery with relief of his abdominal complaints. He has gained weight. His colostomy bothers him and he strongly desires reversal. His cardiologist has cleared him for surgery.   Problem List/Past Medical  VENTRAL HERNIA WITHOUT OBSTRUCTION OR GANGRENE (K43.9) LEFT INGUINAL HERNIA (K40.90) POSTOP CHECK (Z09) POST-OP PAIN (G89.18) COLOSTOMY STATUS (Z93.3)  Other Problems  Diabetes Mellitus Depression High blood pressure Diverticulosis Cholelithiasis Arthritis Anxiety Disorder Chest pain Back Pain Melanoma Inguinal Hernia Sleep Apnea  Past Surgical History  Open Inguinal Hernia Surgery Left. Foot Surgery Right. Tonsillectomy Oral Surgery Coronary Artery Bypass Graft Cataract Surgery Bilateral.  Diagnostic Studies History  Colonoscopy 1-5 years ago  Allergies Aspirin Low Dose *ANALGESICS - NonNarcotic* Lipitor *ANTIHYPERLIPIDEMICS* Lopressor *BETA BLOCKERS* Penicillin G Pot in Dextrose *PENICILLINS*  Medication History  Hydrocodone-Acetaminophen (5-325MG  Tablet, 1 (one) Tablet Oral every six hours, as needed, Taken starting 10/03/2015) Active. Simvastatin (20MG  Tablet, Oral daily) Active. Pantoprazole Sodium (40MG  Tablet DR, Oral two times daily) Active. Ferrous Sulfate (325 (65 Fe)MG Tablet DR, Oral) Active. Levothyroxine Sodium (50MCG Tablet, Oral) Active. Multiple Vitamin (Oral) Active. Nitroglycerin (0.4MG  Tab Sublingual, Sublingual) Active. Potassium Chloride (10MEQ Capsule ER, Oral) Active. Toprol XL (50MG  Tablet ER 24HR, Oral daily) Active. Potassium Chloride  Crys ER (10MEQ Tablet ER, Oral daily) Active. AmLODIPine Besylate (10MG  Tablet, Oral daily) Active. Oxycodone-Acetaminophen (5-325MG  Tablet, Oral as needed) Active. MetFORMIN HCl (1000MG  Tablet, Oral daily) Active. Hydrochlorothiazide (25MG  Tablet, Oral daily) Active. Isosorbide Mononitrate ER (60MG  Tablet ER 24HR, Oral daily) Active. Latanoprost (0.005% Solution, Ophthalmic as needed) Active. Medications Reconciled Neomycin Sulfate (500MG  Tablet, 2 (two) Tablet Oral SEE NOTE, Taken starting 02/11/2016) Active. (TAKE TWO TABLETS AT 2 PM, 3 PM, AND 10 PM THE DAY PRIOR TO SURGERY) Flagyl (500MG  Tablet, 2 (two) Tablet Oral SEE NOTE, Taken starting 02/11/2016) Active. (Take at 2pm, 3pm, and 10pm the day prior to your colon operation)  Social History Tobacco use Former smoker. Caffeine use Carbonated beverages, Coffee, Tea. No drug use No alcohol use  Family History  Breast Cancer Mother. Arthritis Father, Son. Heart Disease Father.  Vitals  Weight: 168 lb Height: 70in Body Surface Area: 1.94 m Body Mass Index: 24.11 kg/m  Temp.: 10F(Temporal)  Pulse: 75 (Regular)  BP: 130/80 (Sitting, Left Arm, Standard)       Physical Exam  The physical exam findings are as follows: Note:General: Alert, well-developed and well nourished elderly Caucasian male, in no distress Skin: Warm and dry without rash or infection. HEENT: No palpable masses or thyromegaly. Sclera nonicteric. Pupils equal round and reactive. Lymph nodes: No cervical, supraclavicular, or inguinal nodes palpable. Lungs: Breath sounds clear and equal. No wheezing or increased work of breathing. Cardiovascular: Regular rate and rhythm without murmer. No JVD or edema. Abdomen: Nondistended. Soft and nontender. Well-healed incision without hernias No masses palpable. No organomegaly. Colostomy left lower quadrant. Extremities: No edema or joint swelling or deformity. No chronic venous stasis  changes. Neurologic: Alert and fully oriented. Gait normal. No focal weakness. Psychiatric: Normal mood and affect. Thought content appropriate with normal judgement and insight    Assessment & Plan ( COLOSTOMY STATUS (Z93.3) Impression: Status post emergency Hartmann colectomy for diverticular stricture with complete obstruction.  He is doing well at this point without postoperative complication and has returned to his baseline. His colostomy bothers him and he strongly wants it reversed. I discussed the procedure in detail today. He understands that it does not medically absolutely necessary. We discussed risks of anesthesia complications, bleeding, infection, anastomotic leak or possible death. Overall however I think he is in pretty good shape and likely would do well. After our discussion he strongly desires reversal. I'm going to obtain a preoperative barium study per colostomy and rectum. He is given prescriptions for mechanical and antibiotic bowel prep. We will schedule him for takedown of his [facility] colostomy. Current Plans Pt Education - [facility] Colon Bowel Prep 2015 Miralax/Antibiotics Reversal of colostomy under general anesthesia

## 2016-03-16 NOTE — Transfer of Care (Signed)
Immediate Anesthesia Transfer of Care Note  Patient: Kenneth [name]  Procedure(s) Performed: Procedure(s): COLOSTOMY TAKEDOWN (N/A)  Patient Location: PACU  Anesthesia Type:General  Level of Consciousness:  sedated, patient cooperative and responds to stimulation  Airway & Oxygen Therapy:Patient Spontanous Breathing and Patient connected to face mask oxgen  Post-op Assessment:  Report given to PACU RN and Post -op Vital signs reviewed and stable  Post vital signs:  Reviewed and stable  Last Vitals:  Filed Vitals:   03/16/16 0736 03/16/16 1304  BP: 144/59 167/69  Pulse: 62 64  Temp: 36.5 C   Resp: 18     Complications: No apparent anesthesia complications

## 2016-03-16 NOTE — Anesthesia Preprocedure Evaluation (Addendum)
Anesthesia Evaluation  Patient identified by MRN, date of birth, ID band Patient awake    Reviewed: Allergy & Precautions  History of Anesthesia Complications (+) PONV and history of anesthetic complications  Airway Mallampati: II  TM Distance: >3 FB Neck ROM: Full    Dental  (+) Partial Lower, Dental Advisory Given   Pulmonary Current Smoker,    Pulmonary exam normal        Cardiovascular hypertension, + angina with exertion + CAD and + CABG  Normal cardiovascular exam     Neuro/Psych Anxiety negative neurological ROS     GI/Hepatic Neg liver ROS, hiatal hernia, GERD  ,  Endo/Other  diabetes  Renal/GU negative Renal ROS     Musculoskeletal   Abdominal   Peds  Hematology   Anesthesia Other Findings   Reproductive/Obstetrics                            Anesthesia Physical Anesthesia Plan  ASA: III  Anesthesia Plan: General   Post-op Pain Management:    Induction: Intravenous  Airway Management Planned: Oral ETT  Additional Equipment:   Intra-op Plan:   Post-operative Plan: Extubation in OR  Informed Consent: I have reviewed the patients History and Physical, chart, labs and discussed the procedure including the risks, benefits and alternatives for the proposed anesthesia with the patient or authorized representative who has indicated his/her understanding and acceptance.   Dental advisory given  Plan Discussed with: Anesthesiologist and CRNA  Anesthesia Plan Comments:        Anesthesia Quick Evaluation

## 2016-03-17 ENCOUNTER — Encounter (HOSPITAL_COMMUNITY): Payer: Self-pay | Admitting: General Surgery

## 2016-03-17 LAB — GLUCOSE, CAPILLARY
GLUCOSE-CAPILLARY: 143 mg/dL — AB (ref 65–99)
GLUCOSE-CAPILLARY: 150 mg/dL — AB (ref 65–99)
Glucose-Capillary: 143 mg/dL — ABNORMAL HIGH (ref 65–99)
Glucose-Capillary: 144 mg/dL — ABNORMAL HIGH (ref 65–99)
Glucose-Capillary: 134 mg/dL — ABNORMAL HIGH (ref 65–99)
Glucose-Capillary: 151 mg/dL — ABNORMAL HIGH (ref 65–99)

## 2016-03-17 LAB — CBC
HCT: 31 % — ABNORMAL LOW (ref 39.0–52.0)
HEMOGLOBIN: 10.6 g/dL — AB (ref 13.0–17.0)
MCH: 31.1 pg (ref 26.0–34.0)
MCHC: 34.2 g/dL (ref 30.0–36.0)
MCV: 90.9 fL (ref 78.0–100.0)
Platelets: 292 10*3/uL (ref 150–400)
RBC: 3.41 MIL/uL — ABNORMAL LOW (ref 4.22–5.81)
RDW: 13.2 % (ref 11.5–15.5)
WBC: 15.6 10*3/uL — AB (ref 4.0–10.5)

## 2016-03-17 LAB — BASIC METABOLIC PANEL
ANION GAP: 7 (ref 5–15)
BUN: 10 mg/dL (ref 6–20)
CALCIUM: 8.6 mg/dL — AB (ref 8.9–10.3)
CHLORIDE: 106 mmol/L (ref 101–111)
CO2: 25 mmol/L (ref 22–32)
CREATININE: 0.63 mg/dL (ref 0.61–1.24)
GFR calc non Af Amer: >60 mL/min (ref >60)
Glucose, Bld: 147 mg/dL — ABNORMAL HIGH (ref 65–99)
Potassium: 4 mmol/L (ref 3.5–5.1)
SODIUM: 138 mmol/L (ref 135–145)

## 2016-03-17 MED ORDER — CALCIUM CARBONATE ANTACID 500 MG PO CHEW
2.0000 | CHEWABLE_TABLET | ORAL | Status: DC | PRN
  Administered 2016-03-17 – 2016-03-19 (×3): 400 mg via ORAL
  Filled 2016-03-17 (×3): qty 2

## 2016-03-17 NOTE — Progress Notes (Signed)
Patient ID: Kenneth [name], male   DOB: 01-24-1928, 82 y.o.   MRN: [id number] 1 Day Post-Op  Subjective: Doing well this morning. Denies pain or nausea.Just sore. Has stood up at the side of the bed. Foley just removed.  Objective: Vital signs in last 24 hours: Temp:  [97.3 F (36.3 C)-98.3 F (36.8 C)] 98 F (36.7 C) (04/12 0558) Pulse Rate:  [64-101] 78 (04/12 0558) Resp:  [11-17] 16 (04/12 0558) BP: (133-182)/(63-83) 133/63 mmHg (04/12 0558) SpO2:  [96 %-100 %] 99 % (04/12 0558) Last BM Date: 03/16/16  Intake/Output from previous day: 04/11 0701 - 04/12 0700 In: 3945 [I.V.:3945] Out: 1875 [Urine:1775; Blood:100] Intake/Output this shift:    General appearance: alert, cooperative and no distress Resp: clear to auscultation bilaterally GI: nondistended. Mild appropriate incisional tenderness. Incision/Wound: no erythema or drainage  Lab Results:   Recent Labs  03/17/16 0429  WBC 15.6*  HGB 10.6*  HCT 31.0*  PLT 292   BMET  Recent Labs  03/17/16 0429  NA 138  K 4.0  CL 106  CO2 25  GLUCOSE 147*  BUN 10  CREATININE 0.63  CALCIUM 8.6*     Studies/Results: No results found.  Anti-infectives: Anti-infectives    Start     Dose/Rate Route Frequency Ordered Stop   03/15/16 1408  clindamycin (CLEOCIN) IVPB 900 mg     900 mg 100 mL/hr over 30 Minutes Intravenous 60 min pre-op 03/15/16 1408 03/16/16 1104   03/15/16 1408  gentamicin (GARAMYCIN) 380 mg in dextrose 5 % 100 mL IVPB     5 mg/kg  75.7 kg 109.5 mL/hr over 60 Minutes Intravenous 60 min pre-op 03/15/16 1408 03/16/16 1122      Assessment/Plan: s/p Procedure(s): COLOSTOMY TAKEDOWN Doing well postoperatively.Foley removed. Out of bed to chair today. Start clear liquid diet.   LOS: 1 day    Dakia Schifano T 03/17/2016

## 2016-03-18 LAB — GLUCOSE, CAPILLARY
GLUCOSE-CAPILLARY: 114 mg/dL — AB (ref 65–99)
GLUCOSE-CAPILLARY: 136 mg/dL — AB (ref 65–99)
GLUCOSE-CAPILLARY: 147 mg/dL — AB (ref 65–99)
GLUCOSE-CAPILLARY: 150 mg/dL — AB (ref 65–99)
Glucose-Capillary: 162 mg/dL — ABNORMAL HIGH (ref 65–99)
Glucose-Capillary: 120 mg/dL — ABNORMAL HIGH (ref 65–99)
Glucose-Capillary: 178 mg/dL — ABNORMAL HIGH (ref 65–99)

## 2016-03-18 LAB — BASIC METABOLIC PANEL
Anion gap: 6 (ref 5–15)
BUN: 8 mg/dL (ref 6–20)
CALCIUM: 8.7 mg/dL — AB (ref 8.9–10.3)
CO2: 27 mmol/L (ref 22–32)
CREATININE: 0.7 mg/dL (ref 0.61–1.24)
Chloride: 105 mmol/L (ref 101–111)
GFR calc non Af Amer: >60 mL/min (ref >60)
Glucose, Bld: 160 mg/dL — ABNORMAL HIGH (ref 65–99)
Potassium: 4.3 mmol/L (ref 3.5–5.1)
SODIUM: 138 mmol/L (ref 135–145)

## 2016-03-18 LAB — CBC
HCT: 31.5 % — ABNORMAL LOW (ref 39.0–52.0)
Hemoglobin: 10.6 g/dL — ABNORMAL LOW (ref 13.0–17.0)
MCH: 31.8 pg (ref 26.0–34.0)
MCHC: 33.7 g/dL (ref 30.0–36.0)
MCV: 94.6 fL (ref 78.0–100.0)
Platelets: 283 10*3/uL (ref 150–400)
RBC: 3.33 MIL/uL — ABNORMAL LOW (ref 4.22–5.81)
RDW: 13.4 % (ref 11.5–15.5)
WBC: 12.2 10*3/uL — ABNORMAL HIGH (ref 4.0–10.5)

## 2016-03-18 NOTE — Progress Notes (Signed)
Patient ID: Kenneth [name], male   DOB: 02/07/1928, 82 y.o.   MRN: [id number] 2 Days Post-Op  Subjective: Feels pretty well.  Very sore but better than yesterday.  No nausea today.  No flatus or BM yet.  Objective: Vital signs in last 24 hours: Temp:  [98.2 F (36.8 C)-98.6 F (37 C)] 98.2 F (36.8 C) (04/13 0556) Pulse Rate:  [69-72] 72 (04/13 0556) Resp:  [15-16] 16 (04/13 0556) BP: (127-139)/(62-70) 138/70 mmHg (04/13 0556) SpO2:  [95 %-96 %] 95 % (04/13 0556) Last BM Date: 03/16/16  Intake/Output from previous day: 04/12 0701 - 04/13 0700 In: 3280 [P.O.:2080; I.V.:1200] Out: 3150 [Urine:3150] Intake/Output this shift:    General appearance: alert, cooperative and no distress Resp: clear to auscultation bilaterally GI: BS present.  Mild distention but soft with minimal incisional tenderness Incision/Wound: Clean and dry  Lab Results:   Recent Labs  03/17/16 0429 03/18/16 0431  WBC 15.6* 12.2*  HGB 10.6* 10.6*  HCT 31.0* 31.5*  PLT 292 283   BMET  Recent Labs  03/17/16 0429 03/18/16 0431  NA 138 138  K 4.0 4.3  CL 106 105  CO2 25 27  GLUCOSE 147* 160*  BUN 10 8  CREATININE 0.63 0.70  CALCIUM 8.6* 8.7*   CBG (last 3)   Recent Labs  03/18/16 0435 03/18/16 0437 03/18/16 0746  GLUCAP 150* 162* 120*      Studies/Results: No results found.  Anti-infectives: Anti-infectives    Start     Dose/Rate Route Frequency Ordered Stop   03/15/16 1408  clindamycin (CLEOCIN) IVPB 900 mg     900 mg 100 mL/hr over 30 Minutes Intravenous 60 min pre-op 03/15/16 1408 03/16/16 1104   03/15/16 1408  gentamicin (GARAMYCIN) 380 mg in dextrose 5 % 100 mL IVPB     5 mg/kg  75.7 kg 109.5 mL/hr over 60 Minutes Intravenous 60 min pre-op 03/15/16 1408 03/16/16 1122      Assessment/Plan: s/p Procedure(s): COLOSTOMY TAKEDOWN Doing well without apparent complication.  Advance to [facility] diet.  Ambulate   LOS: 2 days    Kenneth [name] T 03/18/2016

## 2016-03-19 LAB — GLUCOSE, CAPILLARY
GLUCOSE-CAPILLARY: 129 mg/dL — AB (ref 65–99)
GLUCOSE-CAPILLARY: 132 mg/dL — AB (ref 65–99)
GLUCOSE-CAPILLARY: 160 mg/dL — AB (ref 65–99)
GLUCOSE-CAPILLARY: 98 mg/dL (ref 65–99)
Glucose-Capillary: 142 mg/dL — ABNORMAL HIGH (ref 65–99)

## 2016-03-19 LAB — BASIC METABOLIC PANEL
Anion gap: 7 (ref 5–15)
BUN: 7 mg/dL (ref 6–20)
CHLORIDE: 101 mmol/L (ref 101–111)
CO2: 27 mmol/L (ref 22–32)
CREATININE: 0.77 mg/dL (ref 0.61–1.24)
Calcium: 9.2 mg/dL (ref 8.9–10.3)
GFR calc Af Amer: >60 mL/min (ref >60)
GFR calc non Af Amer: >60 mL/min (ref >60)
Glucose, Bld: 141 mg/dL — ABNORMAL HIGH (ref 65–99)
POTASSIUM: 4 mmol/L (ref 3.5–5.1)
SODIUM: 135 mmol/L (ref 135–145)

## 2016-03-19 LAB — CBC
HEMATOCRIT: 33.3 % — AB (ref 39.0–52.0)
HEMOGLOBIN: 11.2 g/dL — AB (ref 13.0–17.0)
MCH: 31.5 pg (ref 26.0–34.0)
MCHC: 33.6 g/dL (ref 30.0–36.0)
MCV: 93.8 fL (ref 78.0–100.0)
Platelets: 304 10*3/uL (ref 150–400)
RBC: 3.55 MIL/uL — AB (ref 4.22–5.81)
RDW: 12.9 % (ref 11.5–15.5)
WBC: 10.1 10*3/uL (ref 4.0–10.5)

## 2016-03-19 NOTE — Progress Notes (Signed)
Pt c/o increased nausea tonight.  Administered IV Zofran, pt had dry heaves.  After his stomach settled down, I gave him Vicodin several times, but he claimed they didn't do much for his pain.  I then gave him Mophine.  Pt rested better.

## 2016-03-19 NOTE — Care Management Important Message (Signed)
Important Message  Patient Details  Name: NAYEF BERRONES MRN: [id number] Date of Birth: Apr 30, 1928   Medicare Important Message Given:  Yes    Camillo Flaming 03/19/2016, 9:20 AMImportant Message  Patient Details  Name: LOUCAS LINSCHEID MRN: [id number] Date of Birth: 11-20-1928   Medicare Important Message Given:  Yes    Camillo Flaming 03/19/2016, 9:20 AM

## 2016-03-19 NOTE — Progress Notes (Signed)
Patient ID: Kenneth [name], male   DOB: 10-Apr-1928, 82 y.o.   MRN: [id number]  [facility]  Subjective: POD#3 - patient on full liquid diet.  Some nausea and emesis last night treated with Zofran.  "Sore" this morning, mild nausea.  Has ambulated.  Voiding.  Objective: Vital signs in last 24 hours: Temp:  [97.6 F (36.4 C)-98.7 F (37.1 C)] 98.1 F (36.7 C) (04/14 0609) Pulse Rate:  [81-86] 81 (04/14 0609) Resp:  [16-18] 16 (04/14 0609) BP: (125-158)/(71-78) 125/71 mmHg (04/14 0609) SpO2:  [95 %-100 %] 97 % (04/14 0609) Last BM Date: 03/16/16  Intake/Output from previous day: 04/13 0701 - 04/14 0700 In: 2060 [P.O.:720; I.V.:1340] Out: 3400 [Urine:3400] Intake/Output this shift:    Physical Exam: HEENT - sclerae clear, mucous membranes moist Neck - soft Chest - clear bilaterally Cor - RRR Abdomen - soft without distension; rare BS present; dressings dry and intact Ext - no edema, non-tender Neuro - alert & oriented, no focal deficits  Lab Results:   Recent Labs  03/18/16 0431 03/19/16 0431  WBC 12.2* 10.1  HGB 10.6* 11.2*  HCT 31.5* 33.3*  PLT 283 304   BMET  Recent Labs  03/18/16 0431 03/19/16 0431  NA 138 135  K 4.3 4.0  CL 105 101  CO2 27 27  GLUCOSE 160* 141*  BUN 8 7  CREATININE 0.70 0.77  CALCIUM 8.7* 9.2   PT/INR No results for input(s): LABPROT, INR in the last 72 hours. Comprehensive Metabolic Panel:    Component Value Date/Time   NA 135 03/19/2016 0431   NA 138 03/18/2016 0431   K 4.0 03/19/2016 0431   K 4.3 03/18/2016 0431   CL 101 03/19/2016 0431   CL 105 03/18/2016 0431   CO2 27 03/19/2016 0431   CO2 27 03/18/2016 0431   BUN 7 03/19/2016 0431   BUN 8 03/18/2016 0431   CREATININE 0.77 03/19/2016 0431   CREATININE 0.70 03/18/2016 0431   GLUCOSE 141* 03/19/2016 0431   GLUCOSE 160* 03/18/2016 0431   CALCIUM 9.2 03/19/2016 0431   CALCIUM 8.7* 03/18/2016 0431   AST 15 09/11/2015 0526   AST 21  09/09/2015 1020   ALT 10* 09/11/2015 0526   ALT 14* 09/09/2015 1020   ALKPHOS 59 09/11/2015 0526   ALKPHOS 73 09/09/2015 1020   BILITOT 0.6 09/11/2015 0526   BILITOT 0.5 09/09/2015 1020   PROT 5.9* 09/11/2015 0526   PROT 7.0 09/09/2015 1020   ALBUMIN 2.9* 09/11/2015 0526   ALBUMIN 3.5 09/09/2015 1020    Studies/Results: No results found.  Assessment & Plans: Status post colostomy closure  Continue on full liquid diet today and monitor nausea  Encouraged ambulation  IVF at [place], MD, [facility] Office: [place] 03/19/2016

## 2016-03-20 NOTE — Progress Notes (Signed)
Patient ID: Kenneth [name], male   DOB: Mar 09, 1928, 82 y.o.   MRN: [id number]  [facility]  Subjective: POD#4 - nausea better, no emesis.  Tolerating full liquids.  Ambulated once yesterday - needs assistance.  Passing flatus, no BM.  Objective: Vital signs in last 24 hours: Temp:  [98 F (36.7 C)-98.7 F (37.1 C)] 98 F (36.7 C) (04/15 0500) Pulse Rate:  [68-78] 73 (04/15 0500) Resp:  [16-18] 18 (04/15 0500) BP: (126-133)/(54-78) 133/65 mmHg (04/15 0500) SpO2:  [96 %-98 %] 96 % (04/15 0500) Last BM Date:  (pt states "I don't remember")  Intake/Output from previous day: 04/14 0701 - 04/15 0700 In: 1920 [P.O.:720; I.V.:1200] Out: 975 [Urine:975] Intake/Output this shift:    Physical Exam: HEENT - sclerae clear, mucous membranes moist Neck - soft Chest - clear bilaterally Cor - RRR Abdomen - soft without distension; BS present; dressings dry and intact Ext - no edema, non-tender Neuro - alert & oriented, no focal deficits  Lab Results:   Recent Labs  03/18/16 0431 03/19/16 0431  WBC 12.2* 10.1  HGB 10.6* 11.2*  HCT 31.5* 33.3*  PLT 283 304   BMET  Recent Labs  03/18/16 0431 03/19/16 0431  NA 138 135  K 4.3 4.0  CL 105 101  CO2 27 27  GLUCOSE 160* 141*  BUN 8 7  CREATININE 0.70 0.77  CALCIUM 8.7* 9.2   PT/INR No results for input(s): LABPROT, INR in the last 72 hours. Comprehensive Metabolic Panel:    Component Value Date/Time   NA 135 03/19/2016 0431   NA 138 03/18/2016 0431   K 4.0 03/19/2016 0431   K 4.3 03/18/2016 0431   CL 101 03/19/2016 0431   CL 105 03/18/2016 0431   CO2 27 03/19/2016 0431   CO2 27 03/18/2016 0431   BUN 7 03/19/2016 0431   BUN 8 03/18/2016 0431   CREATININE 0.77 03/19/2016 0431   CREATININE 0.70 03/18/2016 0431   GLUCOSE 141* 03/19/2016 0431   GLUCOSE 160* 03/18/2016 0431   CALCIUM 9.2 03/19/2016 0431   CALCIUM 8.7* 03/18/2016 0431   AST 15 09/11/2015 0526   AST 21 09/09/2015  1020   ALT 10* 09/11/2015 0526   ALT 14* 09/09/2015 1020   ALKPHOS 59 09/11/2015 0526   ALKPHOS 73 09/09/2015 1020   BILITOT 0.6 09/11/2015 0526   BILITOT 0.5 09/09/2015 1020   PROT 5.9* 09/11/2015 0526   PROT 7.0 09/09/2015 1020   ALBUMIN 2.9* 09/11/2015 0526   ALBUMIN 3.5 09/09/2015 1020    Studies/Results: No results found.  Assessment & Plans: Status post colostomy closure Advance to regular diet Encouraged ambulation with assistance IVF at [place], MD, [facility] Office: [place] 03/20/2016

## 2016-03-21 MED ORDER — MORPHINE SULFATE (PF) 2 MG/ML IV SOLN
INTRAVENOUS | Status: AC
  Filled 2016-03-21: qty 1

## 2016-03-21 MED ORDER — MORPHINE SULFATE (PF) 2 MG/ML IV SOLN
1.0000 mg | INTRAVENOUS | Status: DC | PRN
  Administered 2016-03-21: 1 mg via INTRAVENOUS

## 2016-03-21 NOTE — Progress Notes (Signed)
Dr. Harlow Asa aware of pt's increased abd pain this evening. Pain "sharp and crampy" reported pt. Two BM's noted this afternoon. See new order received. Pt encouraged to ambulate.

## 2016-03-21 NOTE — Progress Notes (Signed)
Patient ID: Kenneth [name], male   DOB: Aug 17, 1928, 82 y.o.   MRN: [id number]  [facility]  Subjective: POD#5 - patient tolerating regular diet, passing flatus, no BM yet.  Mild pain.  Ambulating more in halls.  Objective: Vital signs in last 24 hours: Temp:  [98.2 F (36.8 C)-98.9 F (37.2 C)] 98.9 F (37.2 C) (04/16 0534) Pulse Rate:  [63-81] 68 (04/16 0534) Resp:  [18] 18 (04/16 0534) BP: (123-152)/(59-65) 123/59 mmHg (04/16 0534) SpO2:  [95 %-97 %] 97 % (04/16 0534) Last BM Date: 03/16/16  Intake/Output from previous day: 04/15 0701 - 04/16 0700 In: 600 [I.V.:600] Out: -  Intake/Output this shift:    Physical Exam: HEENT - sclerae clear, mucous membranes moist Neck - soft Chest - clear bilaterally Cor - RRR Abdomen - soft without distension; wounds dry and intact - dressing removed Ext - no edema, non-tender Neuro - alert & oriented, no focal deficits  Lab Results:   Recent Labs  03/19/16 0431  WBC 10.1  HGB 11.2*  HCT 33.3*  PLT 304   BMET  Recent Labs  03/19/16 0431  NA 135  K 4.0  CL 101  CO2 27  GLUCOSE 141*  BUN 7  CREATININE 0.77  CALCIUM 9.2   PT/INR No results for input(s): LABPROT, INR in the last 72 hours. Comprehensive Metabolic Panel:    Component Value Date/Time   NA 135 03/19/2016 0431   NA 138 03/18/2016 0431   K 4.0 03/19/2016 0431   K 4.3 03/18/2016 0431   CL 101 03/19/2016 0431   CL 105 03/18/2016 0431   CO2 27 03/19/2016 0431   CO2 27 03/18/2016 0431   BUN 7 03/19/2016 0431   BUN 8 03/18/2016 0431   CREATININE 0.77 03/19/2016 0431   CREATININE 0.70 03/18/2016 0431   GLUCOSE 141* 03/19/2016 0431   GLUCOSE 160* 03/18/2016 0431   CALCIUM 9.2 03/19/2016 0431   CALCIUM 8.7* 03/18/2016 0431   AST 15 09/11/2015 0526   AST 21 09/09/2015 1020   ALT 10* 09/11/2015 0526   ALT 14* 09/09/2015 1020   ALKPHOS 59 09/11/2015 0526   ALKPHOS 73 09/09/2015 1020   BILITOT 0.6 09/11/2015 0526    BILITOT 0.5 09/09/2015 1020   PROT 5.9* 09/11/2015 0526   PROT 7.0 09/09/2015 1020   ALBUMIN 2.9* 09/11/2015 0526   ALBUMIN 3.5 09/09/2015 1020    Studies/Results: No results found.  Assessment & Plans: Status post colostomy closure  Regular diet  Heplock IV  Encouraged ambulation  Pain Rx po  Await BM - likely home tomorrow  [name], MD, [facility] Office: [place] 03/21/2016

## 2016-03-22 LAB — GLUCOSE, CAPILLARY
GLUCOSE-CAPILLARY: 186 mg/dL — AB (ref 65–99)
GLUCOSE-CAPILLARY: 110 mg/dL — AB (ref 65–99)
GLUCOSE-CAPILLARY: 107 mg/dL — AB (ref 65–99)
GLUCOSE-CAPILLARY: 134 mg/dL — AB (ref 65–99)
GLUCOSE-CAPILLARY: 125 mg/dL — AB (ref 65–99)
GLUCOSE-CAPILLARY: 126 mg/dL — AB (ref 65–99)
GLUCOSE-CAPILLARY: 201 mg/dL — AB (ref 65–99)
GLUCOSE-CAPILLARY: 120 mg/dL — AB (ref 65–99)
GLUCOSE-CAPILLARY: 127 mg/dL — AB (ref 65–99)
GLUCOSE-CAPILLARY: 132 mg/dL — AB (ref 65–99)
Glucose-Capillary: 103 mg/dL — ABNORMAL HIGH (ref 65–99)
Glucose-Capillary: 116 mg/dL — ABNORMAL HIGH (ref 65–99)
Glucose-Capillary: 118 mg/dL — ABNORMAL HIGH (ref 65–99)
Glucose-Capillary: 124 mg/dL — ABNORMAL HIGH (ref 65–99)
Glucose-Capillary: 133 mg/dL — ABNORMAL HIGH (ref 65–99)
Glucose-Capillary: 142 mg/dL — ABNORMAL HIGH (ref 65–99)
Glucose-Capillary: 94 mg/dL (ref 65–99)

## 2016-03-22 MED ORDER — HYDROCODONE-ACETAMINOPHEN 5-325 MG PO TABS: 1.0000 | ORAL_TABLET | ORAL | Status: DC | PRN

## 2016-03-22 NOTE — Progress Notes (Signed)
Patients V/S WNL, tolerating diet without difficulty, pain under control.  Discharge instructions discussed with patient and wife.  All questions answered, patient and wife verbalized understanding.  Prescriptions given.

## 2016-03-22 NOTE — Progress Notes (Signed)
Patient ID: Kenneth [name], male   DOB: 20-Sep-1928, 82 y.o.   MRN: [id number] 6 Days Post-Op  Subjective: Had a lot of crampy abdominal pain yesterday when bowels started moving. This is resolved today. Just "sore" and improving. Has had several bowel movements. Eating well without nausea.Moving about well.  Objective: Vital signs in last 24 hours: Temp:  [97.6 F (36.4 C)-98.4 F (36.9 C)] 98.4 F (36.9 C) (04/16 2135) Pulse Rate:  [81-83] 82 (04/16 2135) Resp:  [18] 18 (04/16 2135) BP: (130-164)/(65-75) 135/65 mmHg (04/16 2135) SpO2:  [97 %-98 %] 98 % (04/16 2135) Last BM Date: 03/21/16  Intake/Output from previous day: 04/16 0701 - 04/17 0700 In: 480 [P.O.:480] Out: 0  Intake/Output this shift:    General appearance: alert, cooperative and no distress GI: normal findings: soft, non-tender and nondistended Incision/Wound: healing well without erythema or drainage  Lab Results:  No results for input(s): WBC, HGB, HCT, PLT in the last 72 hours. BMET No results for input(s): NA, K, CL, CO2, GLUCOSE, BUN, CREATININE, CALCIUM in the last 72 hours.   Studies/Results: No results found.  Anti-infectives: Anti-infectives    Start     Dose/Rate Route Frequency Ordered Stop   03/15/16 1408  clindamycin (CLEOCIN) IVPB 900 mg     900 mg 100 mL/hr over 30 Minutes Intravenous 60 min pre-op 03/15/16 1408 03/16/16 1104   03/15/16 1408  gentamicin (GARAMYCIN) 380 mg in dextrose 5 % 100 mL IVPB     5 mg/kg  75.7 kg 109.5 mL/hr over 60 Minutes Intravenous 60 min pre-op 03/15/16 1408 03/16/16 1122      Assessment/Plan: s/p Procedure(s): COLOSTOMY TAKEDOWN Doing well without apparent complication. Ready for discharge today.   LOS: 6 days    Cass Edinger T 03/22/2016

## 2016-03-22 NOTE — Discharge Instructions (Signed)
[facility]      [facility] [phone]  OPEN ABDOMINAL SURGERY: POST OP INSTRUCTIONS  Always review your discharge instruction sheet given to you by the facility where your surgery was performed.  IF YOU HAVE DISABILITY OR FAMILY LEAVE FORMS, YOU MUST BRING THEM TO THE OFFICE FOR PROCESSING.  PLEASE DO NOT GIVE THEM TO YOUR DOCTOR.  1. A prescription for pain medication may be given to you upon discharge.  Take your pain medication as prescribed, if needed.  If narcotic pain medicine is not needed, then you may take acetaminophen (Tylenol) or ibuprofen (Advil) as needed. 2. Take your usually prescribed medications unless otherwise directed. 3. If you need a refill on your pain medication, please contact your pharmacy. They will contact our office to request authorization.  Prescriptions will not be filled after 5pm or on week-ends. 4. You should follow a light diet the first few days after arrival home, such as soup and crackers, pudding, etc.unless your doctor has advised otherwise. A high-fiber, low fat diet can be resumed as tolerated.   Be sure to include lots of fluids daily. Most patients will experience some swelling and bruising on the chest and neck area.  Ice packs will help.  Swelling and bruising can take several days to resolve 5. Most patients will experience some swelling and bruising in the area of the incision. Ice pack will help. Swelling and bruising can take several days to resolve..  6. It is common to experience some constipation if taking pain medication after surgery.  Increasing fluid intake and taking a stool softener will usually help or prevent this problem from occurring.  A mild laxative (Milk of Magnesia or Miralax) should be taken according to package directions if there are no bowel movements after 48 hours. 7.  You may have steri-strips (small skin tapes) in place directly over the incision.  These strips should be left on the skin for 7-10 days.  If your  surgeon used skin glue on the incision, you may shower in 24 hours.  The glue will flake off over the next 2-3 weeks.  Any sutures or staples will be removed at the office during your follow-up visit. You may find that a light gauze bandage over your incision may keep your staples from being rubbed or pulled. You may shower and replace the bandage daily. 8. ACTIVITIES:  You may resume regular (light) daily activities beginning the next day--such as daily self-care, walking, climbing stairs--gradually increasing activities as tolerated.  You may have sexual intercourse when it is comfortable.  Refrain from any heavy lifting or straining until approved by your doctor. a. You may drive when you no longer are taking prescription pain medication, you can comfortably wear a seatbelt, and you can safely maneuver your car and apply brakes b. Return to Work: ___________________________________ 9. You should see your doctor in the office for a follow-up appointment approximately two weeks after your surgery.  Make sure that you call for this appointment within a day or two after you arrive home to insure a convenient appointment time. OTHER INSTRUCTIONS:  _____________________________________________________________ _____________________________________________________________  WHEN TO CALL YOUR DOCTOR: 1. Fever over 101.0 2. Inability to urinate 3. Nausea and/or vomiting 4. Extreme swelling or bruising 5. Continued bleeding from incision. 6. Increased pain, redness, or drainage from the incision. 7. Difficulty swallowing or breathing 8. Muscle cramping or spasms. 9. Numbness or tingling in hands or feet or around lips.  The clinic staff is available to   answer your questions during regular business hours.  Please don't hesitate to call and ask to speak to one of the nurses if you have concerns.  For further questions, please visit [email or web address]   

## 2016-03-22 NOTE — Discharge Summary (Signed)
Patient ID: Kenneth [name] [id number] 82 y.o. Aug 10, 1928  03/16/2016  Discharge date and time: 03/22/2016   Admitting Physician: Excell Seltzer T  Discharge Physician: Excell Seltzer T  Admission Diagnoses: colostomy  Discharge Diagnoses: same  Operations: Procedure(s): COLOSTOMY TAKEDOWN  Admission Condition: good  Discharged Condition: good  Indication for Admission: patient is approximately 6 months following emergency Hartmann colectomy for obstructing diverticulitis.After extensive discussion the office regarding alternatives and risks he strongly desires colostomy takedown. Following a mechanical and antibiotic bowel prep at home he is electively admitted for this procedure.  Hospital Course: On the day of admission the patient underwent an uneventful takedown of his Hartmann colostomy. His postoperative course was unremarkable.He had some moderate expected pain early postoperatively thatgradually improved. Foley catheter was removed on the first postoperative day and he was able to void. He was started on clear liquids and gradually advanced to a regular diet.The day before admission he had a lot of crampy abdominal pain and began having bowel movements.On the day of admissionhis pain is resolved. Has just mild soreness. Ambulatory. Having bowel movements. Incisions are clean without evidence of infection.He is felt ready for discharge.   Disposition: Home  Patient Instructions:    Medication List    TAKE these medications        acetaminophen 325 MG tablet  Commonly known as:  TYLENOL  Take 325 mg by mouth every 6 (six) hours as needed for mild pain.     amLODipine 10 MG tablet  Commonly known as:  NORVASC  Take 10 mg by mouth daily.     ferrous sulfate 325 (65 FE) MG tablet  Take 325 mg by mouth daily with breakfast.     hydrochlorothiazide 25 MG tablet  Commonly known as:  HYDRODIURIL  Take 12.5 mg by mouth daily.     HYDROcodone-acetaminophen  5-325 MG tablet  Commonly known as:  NORCO/VICODIN  Take 1 tablet by mouth every 6 (six) hours as needed for moderate pain.     HYDROcodone-acetaminophen 5-325 MG tablet  Commonly known as:  NORCO/VICODIN  Take 1-2 tablets by mouth every 4 (four) hours as needed for moderate pain.     ipratropium 0.03 % nasal spray  Commonly known as:  ATROVENT  Place 2 sprays into both nostrils 2 (two) times daily as needed for rhinitis.     isosorbide mononitrate 60 MG 24 hr tablet  Commonly known as:  IMDUR  Take 60 mg by mouth every evening.     latanoprost 0.005 % ophthalmic solution  Commonly known as:  XALATAN  Place 1 drop into both eyes at bedtime.     levothyroxine 50 MCG tablet  Commonly known as:  SYNTHROID, LEVOTHROID  Take 50 mcg by mouth daily before breakfast.     metFORMIN 1000 MG tablet  Commonly known as:  GLUCOPHAGE  Take 500 mg by mouth daily with breakfast.     metoprolol succinate 50 MG 24 hr tablet  Commonly known as:  TOPROL-XL  Take 25 mg by mouth daily. Take with or immediately following a meal.     metoprolol succinate 25 MG 24 hr tablet  Commonly known as:  TOPROL-XL  Take 1 tablet (25 mg total) by mouth daily.     multivitamin with minerals Tabs tablet  Take 1 tablet by mouth daily.     nitroGLYCERIN 0.4 MG SL tablet  Commonly known as:  NITROSTAT  Place 1 tablet (0.4 mg total) under the tongue every 5 (five) minutes as needed  for chest pain.     pantoprazole 40 MG tablet  Commonly known as:  PROTONIX  Take 40 mg by mouth daily.     potassium chloride 10 MEQ tablet  Commonly known as:  K-DUR,KLOR-CON  Take 10 mEq by mouth daily.     simvastatin 20 MG tablet  Commonly known as:  ZOCOR  Take 20 mg by mouth every evening.     VITAMIN B-12 PO  Take 1 tablet by mouth daily.        Activity: no heavy lifting for 4 weeks Diet: diabetic diet Wound Care: none needed  Follow-up:  With Dr. Excell Seltzer in 1 week.  Signed: Edward Jolly MD,  FACS  03/22/2016, 7:43 AM

## 2016-08-12 ENCOUNTER — Encounter (INDEPENDENT_AMBULATORY_CARE_PROVIDER_SITE_OTHER): Payer: Self-pay

## 2016-08-12 ENCOUNTER — Ambulatory Visit (INDEPENDENT_AMBULATORY_CARE_PROVIDER_SITE_OTHER): Payer: Medicare Other | Admitting: Interventional Cardiology

## 2016-08-12 ENCOUNTER — Encounter: Payer: Self-pay | Admitting: Interventional Cardiology

## 2016-08-12 VITALS — BP 138/78 | HR 71 | Ht 71.0 in | Wt 162.1 lb

## 2016-08-12 DIAGNOSIS — I10 Essential (primary) hypertension: Secondary | ICD-10-CM

## 2016-08-12 DIAGNOSIS — I25810 Atherosclerosis of coronary artery bypass graft(s) without angina pectoris: Secondary | ICD-10-CM

## 2016-08-12 DIAGNOSIS — E785 Hyperlipidemia, unspecified: Secondary | ICD-10-CM

## 2016-08-12 NOTE — Progress Notes (Signed)
Cardiology Office Note    Date:  08/12/2016   ID:  Kenneth [name], DOB [birth year], MRN [id number]  PCP:  Irven Shelling, MD  Cardiologist: Sinclair Grooms, MD   Chief Complaint  Patient presents with  . Coronary Artery Disease    History of Present Illness:  Kenneth [name] is a 82 y.o. male coronary artery disease with prior bypass surgery, hypertension, hyperlipidemia, hypertension, and history of GI bleeding.  Kenneth [name] is doing well. He denies angina. He has dyspnea on exertion. He denies dyspnea at rest and orthopnea. No lower extremity swelling. He does have difficulty with ambulation due to poor balance. He has not had syncope.  Past Medical History:  Diagnosis Date  . Anemia    on iron  . Anginal pain ([place])    last 1-2 MONTHS ago goes away if stop and rest  . Anxiety   . Arthritis   . CAD (coronary artery disease)   . Chronic cough   . Chronic rhinitis   . Complication of anesthesia   . Coronary atherosclerosis of unspecified type of vessel, native or graft    CABG 1993, LIMA to LAD, SVG to obtuse marginal one, SVG to posterior descending, stable angina  . Diabetes ([place])   . Difficulty sleeping   . Diverticulosis of colon   . Dizziness   . Dysrhythmia    occ "skips a beat"  . GERD (gastroesophageal reflux disease)   . Glaucoma   . H/O hiatal hernia   . Hernia    left side  . History of gout   . History of kidney stones   . HTN (hypertension)   . Hyperlipidemia `  . Melanoma of skin, site unspecified   . Numbness    TOES  . PONV (postoperative nausea and vomiting)   . [facility] spotted fever 2-3 yrs ago  . Thyroid disease     Past Surgical History:  Procedure Laterality Date  . COLONOSCOPY WITH PROPOFOL N/A 11/27/2013   Procedure: COLONOSCOPY WITH PROPOFOL;  Surgeon: Garlan Fair, MD;  Location: [facility];  Service: Endoscopy;  Laterality: N/A;  . COLOSTOMY TAKEDOWN N/A 03/16/2016   Procedure: COLOSTOMY TAKEDOWN;  Surgeon:  Excell Seltzer, MD;  Location: [facility];  Service: General;  Laterality: N/A;  . CORONARY ARTERY BYPASS GRAFT  1990's   x 3  . HERNIA REPAIR  1970's,17   lft ing hernia  . INGUINAL HERNIA REPAIR Left 06/28/2014   Procedure: Left  INGUINAL HERNIA REPAIR ;  Surgeon: Merrie Roof, MD;  Location: [facility];  Service: General;  Laterality: Left;  . INSERTION OF MESH Left 06/28/2014   Procedure: INSERTION OF MESH;  Surgeon: Merrie Roof, MD;  Location: [facility];  Service: General;  Laterality: Left;  . INSERTION OF MESH N/A 10/11/2014  . LAPAROSCOPY  10/11/2014  . LAPAROTOMY N/A 09/11/2015   Procedure: EXPLORATORY LAPAROTOMY;  Surgeon: Excell Seltzer, MD;  Location: [facility];  Service: General;  Laterality: N/A;  . PARTIAL COLECTOMY N/A 09/11/2015   Procedure: SIGMOID COLECTOMY AND COLOSTOMY;  Surgeon: Excell Seltzer, MD;  Location: [facility];  Service: General;  Laterality: N/A;  . right leg fracture age 86    . TONSILLECTOMY  age 17 or 17  . VENTRAL HERNIA REPAIR  10/11/2014   Procedure: HERNIA REPAIR VENTRAL ADULT;  Surgeon: Autumn Messing III, MD;  Location: [place] [facility];  Service: General;;    Current Medications: Outpatient Medications Prior to Visit  Medication Sig  Dispense Refill  . acetaminophen (TYLENOL) 325 MG tablet Take 325 mg by mouth every 6 (six) hours as needed for mild pain.     [name] amLODipine (NORVASC) 10 MG tablet Take 10 mg by mouth daily.    . Cyanocobalamin (VITAMIN B-12 PO) Take 1 tablet by mouth daily.    . ferrous sulfate 325 (65 FE) MG tablet Take 325 mg by mouth daily with breakfast.    . hydrochlorothiazide (HYDRODIURIL) 25 MG tablet Take 12.5 mg by mouth daily.   0  . HYDROcodone-acetaminophen (NORCO/VICODIN) 5-325 MG tablet Take 1-2 tablets by mouth every 4 (four) hours as needed for moderate pain. 30 tablet 0  . ipratropium (ATROVENT) 0.03 % nasal spray Place 2 sprays into both nostrils 2 (two) times daily as needed for rhinitis.    [name] isosorbide mononitrate (IMDUR) 60 MG 24 hr  tablet Take 60 mg by mouth every evening.     . latanoprost (XALATAN) 0.005 % ophthalmic solution Place 1 drop into both eyes at bedtime.    [name] levothyroxine (SYNTHROID, LEVOTHROID) 50 MCG tablet Take 50 mcg by mouth daily before breakfast.    . metFORMIN (GLUCOPHAGE) 1000 MG tablet Take 500 mg by mouth daily with breakfast.     . metoprolol succinate (TOPROL-XL) 50 MG 24 hr tablet Take 25 mg by mouth daily. Take with or immediately following a meal.    . Multiple Vitamin (MULTIVITAMIN WITH MINERALS) TABS tablet Take 1 tablet by mouth daily.    . nitroGLYCERIN (NITROSTAT) 0.4 MG SL tablet Place 1 tablet (0.4 mg total) under the tongue every 5 (five) minutes as needed for chest pain. 25 tablet 1  . pantoprazole (PROTONIX) 40 MG tablet Take 40 mg by mouth daily.    . potassium chloride (K-DUR,KLOR-CON) 10 MEQ tablet Take 10 mEq by mouth daily.     . simvastatin (ZOCOR) 20 MG tablet Take 20 mg by mouth every evening.    [name] HYDROcodone-acetaminophen (NORCO/VICODIN) 5-325 MG tablet Take 1 tablet by mouth every 6 (six) hours as needed for moderate pain.    . metoprolol succinate (TOPROL-XL) 25 MG 24 hr tablet Take 1 tablet (25 mg total) by mouth daily. (Patient not taking: Reported on 03/08/2016) 30 tablet 0   No facility-administered medications prior to visit.      Allergies:   Aspirin; Lipitor [atorvastatin]; Lopressor [metoprolol tartrate]; and Penicillins   Social History   Social History  . Marital status: Married    Spouse name: N/A  . Number of children: N/A  . Years of education: N/A   Social History Main Topics  . Smoking status: Light Tobacco Smoker    Types: Cigars  . Smokeless tobacco: Never Used  . Alcohol use Yes     Comment: RARE  . Drug use: No     Comment: occ cigars  . Sexual activity: Not Asked   Other Topics Concern  . None   Social History Narrative  . None     Family History:  The patient's family history includes CAD in his father; Cancer in his mother;  Hyperlipidemia in his father.   ROS:   Please see the history of present illness.    Cough, dyspnea on exertion, decreased hearing, leg swelling, fatigue, occasional wheezing, constipation, anxiety, and headaches.  All other systems reviewed and are negative.   PHYSICAL EXAM:   VS:  BP 138/78   Pulse 71   Ht 5\' 11"  (1.803 m)   Wt 162 lb 1.9 oz (73.5 kg)  BMI 22.61 kg/m    GEN: Well nourished, well developed, in no acute distress  HEENT: normal  Neck: no JVD, carotid bruits, or masses Cardiac: RRR; no murmurs, rubs, or gallops,no edema  Respiratory:  clear to auscultation bilaterally, normal work of breathing GI: soft, nontender, nondistended, + BS MS: no deformity or atrophy  Skin: warm and dry, no rash Neuro:  Alert and Oriented x 3, Strength and sensation are intact Psych: euthymic mood, full affect  Wt Readings from Last 3 Encounters:  08/12/16 162 lb 1.9 oz (73.5 kg)  03/16/16 170 lb (77.1 kg)  03/10/16 170 lb (77.1 kg)      Studies/Labs Reviewed:   EKG:  EKG  Normal sinus rhythm, prominent voltage, and PACs.  Recent Labs: 09/11/2015: ALT 10 03/19/2016: BUN 7; Creatinine, Ser 0.77; Hemoglobin 11.2; Platelets 304; Potassium 4.0; Sodium 135   Lipid Panel No results found for: CHOL, TRIG, HDL, CHOLHDL, VLDL, LDLCALC, LDLDIRECT  Additional studies/ records that were reviewed today include:  None    ASSESSMENT:    1. Coronary atherosclerosis of autologous vein bypass graft without angina   2. Essential hypertension   3. Hyperlipidemia      PLAN:  In order of problems listed above:  1. Coronary disease is clinically stable on the current medical regimen. No changes are recommended. 2. Blood pressure is well controlled. I advised him to watch sodium intake. TE 2 fresh vegetables as much as possible. 3. Followed by primary care    Medication Adjustments/Labs and Tests Ordered: Current medicines are reviewed at length with the patient today.  Concerns  regarding medicines are outlined above.  Medication changes, Labs and Tests ordered today are listed in the Patient Instructions below. There are no Patient Instructions on file for this visit.   Signed, Sinclair Grooms, MD  08/12/2016 11:52 AM    [facility] [address], [place], [place]  [address] Phone: [phone]; Fax: [phone]

## 2016-08-12 NOTE — Patient Instructions (Signed)
Your physician recommends that you continue on your current medications as directed. Please refer to the Current Medication list given to you today. Your physician wants you to follow-up in: 1 YEAR WITH DR SMITH.  You will receive a reminder letter in the mail two months in advance. If you don't receive a letter, please call our office to schedule the follow-up appointment.  

## 2016-09-09 ENCOUNTER — Ambulatory Visit
Admission: RE | Admit: 2016-09-09 | Discharge: 2016-09-09 | Disposition: A | Discharge status: Home / Self Care | Payer: Medicare Other | Source: Ambulatory Visit | Attending: Internal Medicine | Admitting: Internal Medicine

## 2016-09-09 ENCOUNTER — Other Ambulatory Visit: Payer: Self-pay | Admitting: Internal Medicine

## 2016-09-09 DIAGNOSIS — I25119 Atherosclerotic heart disease of native coronary artery with unspecified angina pectoris: Secondary | ICD-10-CM

## 2016-09-19 IMAGING — CT CT ABD-PELV W/ CM
2 of 5 series · 15 of 46 positions shown, 17 images · IV contrast (OMNIPAQUE 300)
Comparison: 08/22/2014

CLINICAL DATA: Left lower quadrant pain for 2 weeks. Decreased
bowel movements.

EXAM:
CT ABDOMEN AND PELVIS WITH CONTRAST
TECHNIQUE: Multidetector CT imaging of the abdomen and pelvis was performed
using the standard protocol following bolus administration of
intravenous contrast.
CONTRAST:  100mL OMNIPAQUE IOHEXOL 300 MG/ML  SOLN

[Series 2: abd/pel with · axial · 0.76mm/px · z∈[-396,-21]mm · 12 of 86 slices shown, 14 images]
[im 6/86  soft-tissue]
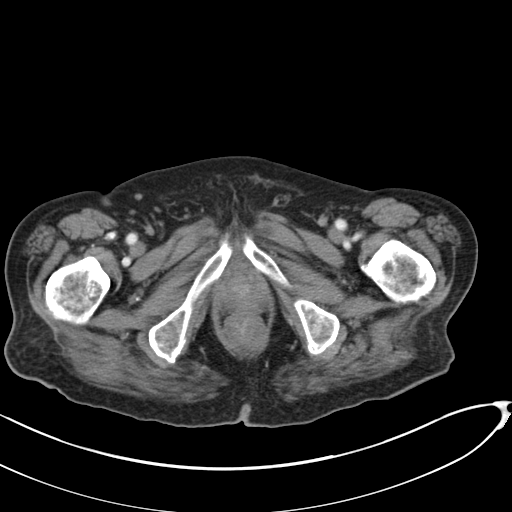
[im 6/86  bone]
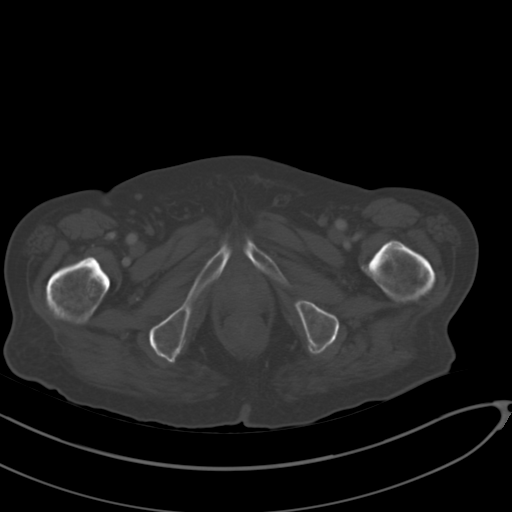
[im 16/86  soft-tissue]
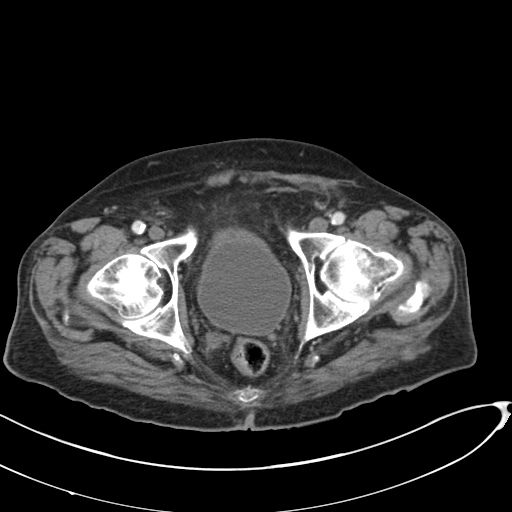
[im 21/86  soft-tissue]
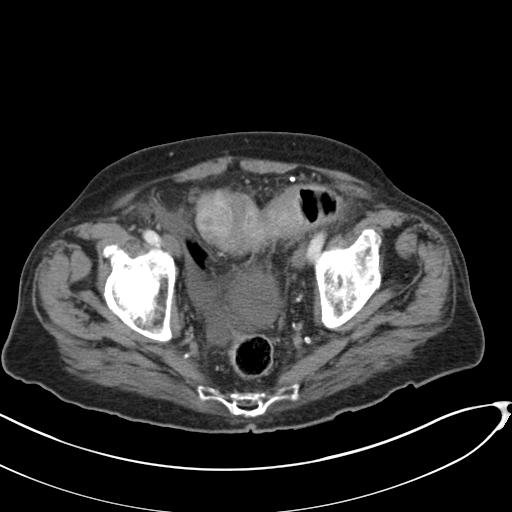
[im 26/86  soft-tissue]
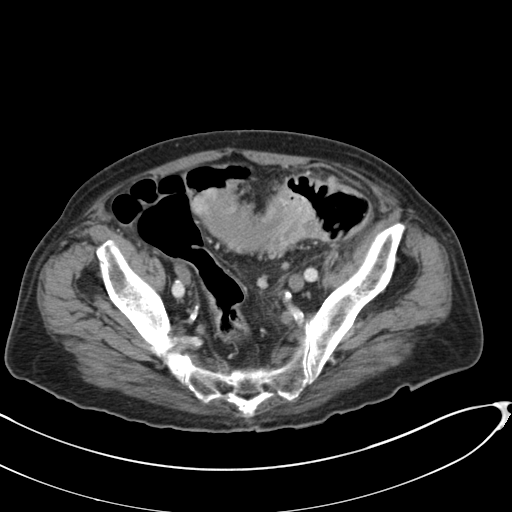
[im 36/86  soft-tissue]
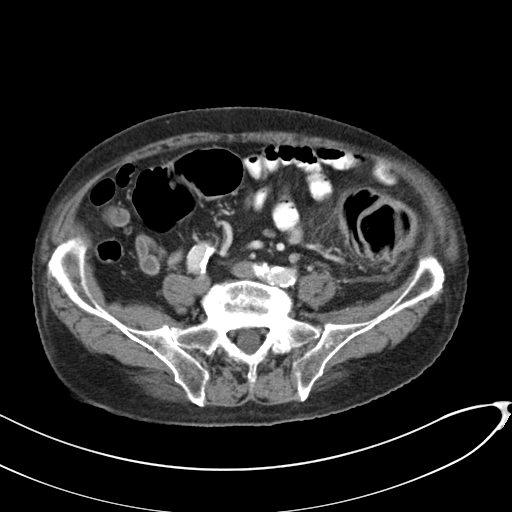
[im 41/86  soft-tissue]
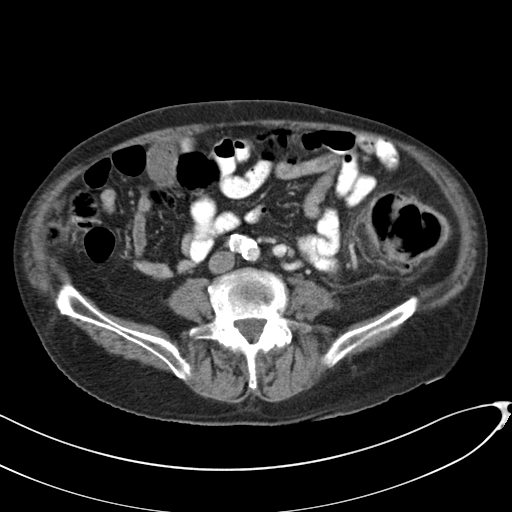
[im 46/86  soft-tissue]
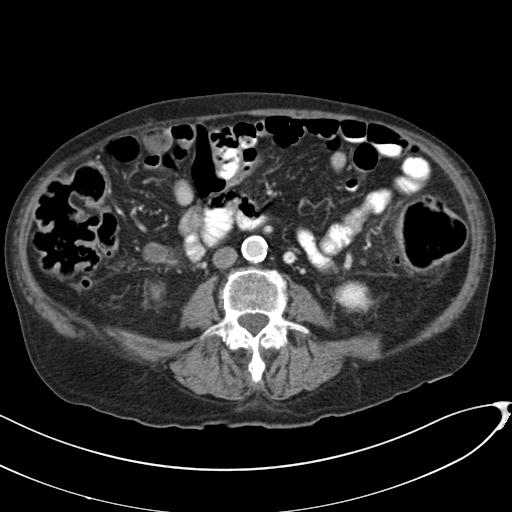
[im 56/86  soft-tissue]
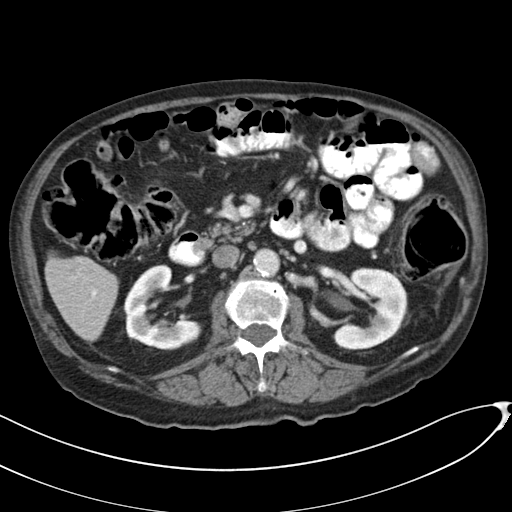
[im 61/86  soft-tissue]
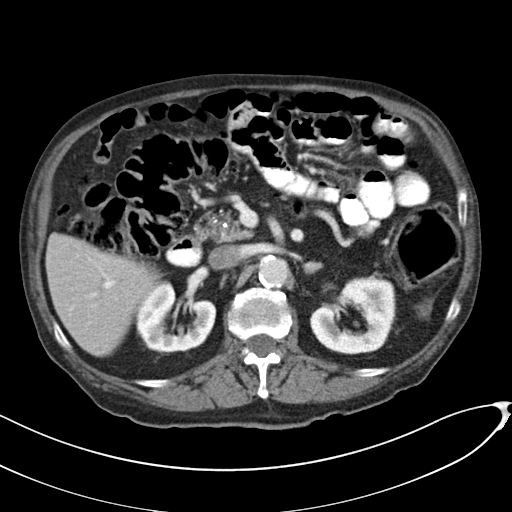
[im 61/86  bone]
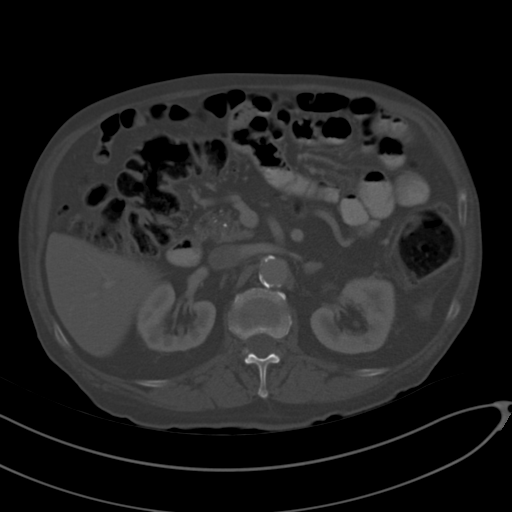
[im 66/86  soft-tissue]
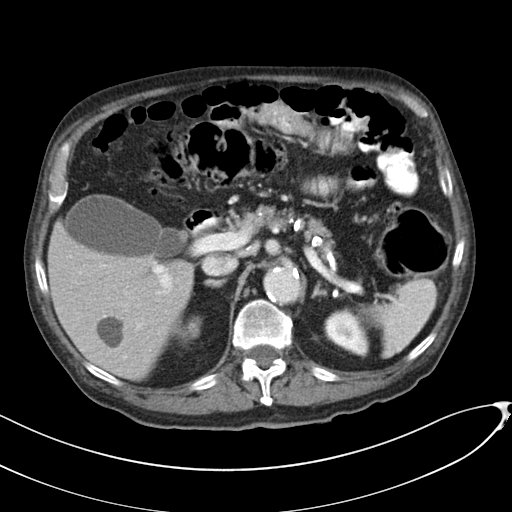
[im 76/86  soft-tissue]
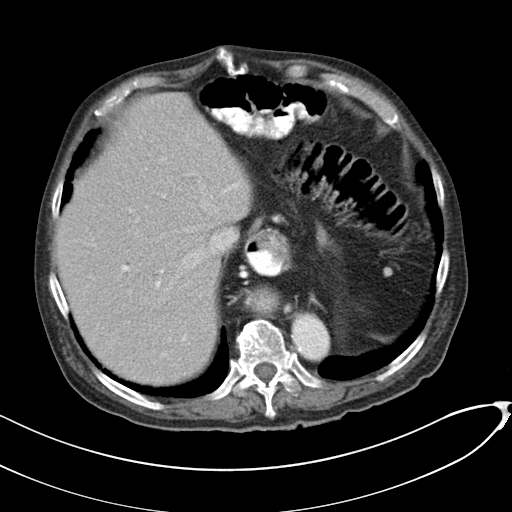
[im 81/86  soft-tissue]
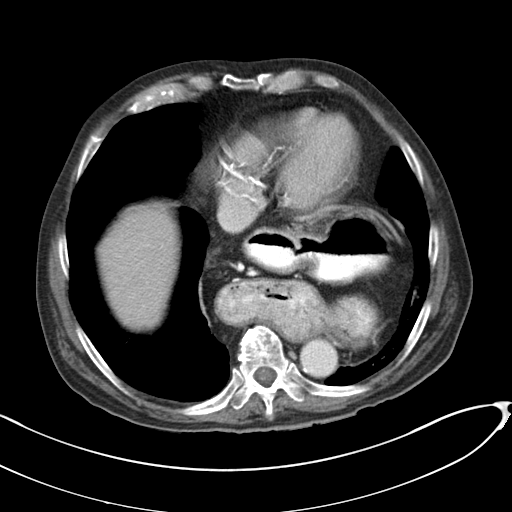

[Series 5: coronal a/|p · coronal · 0.84mm/px · 3 of 98 slices shown]
[im 33/98  soft-tissue]
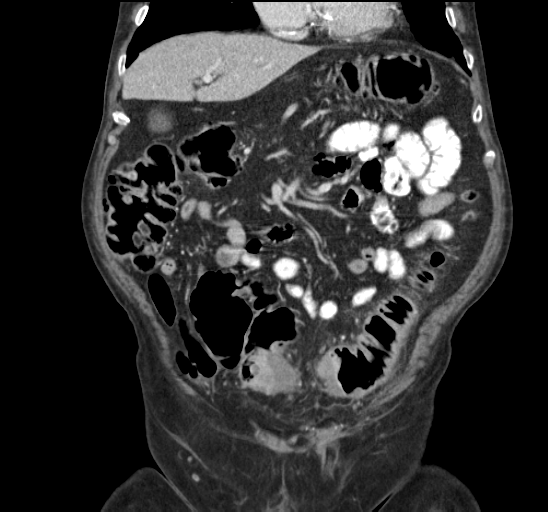
[im 44/98  soft-tissue]
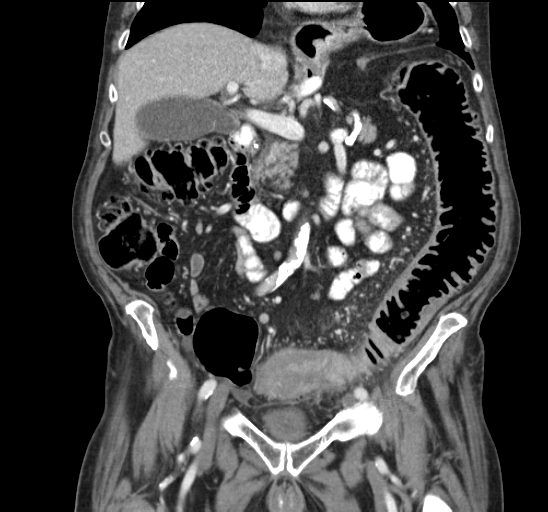
[im 54/98  soft-tissue]
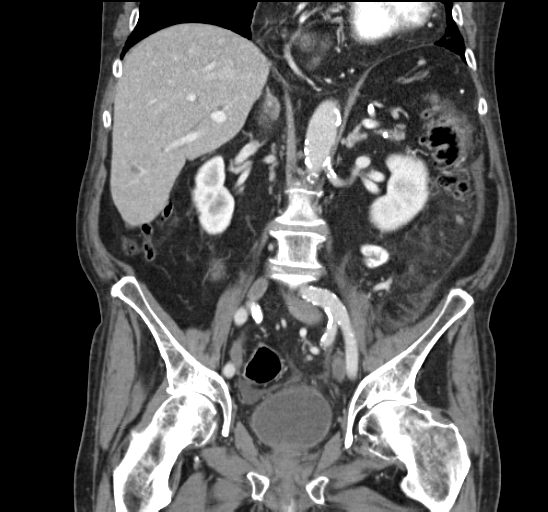

[15 of 46 positions shown; findings below may reference images not displayed]

FINDINGS: Stable small calcified granuloma at the left lung base. No evidence
pleural effusions.

No evidence for free intraperitoneal air. Again noted is a large
hiatal hernia containing most of the stomach. There is no evidence
for gastric obstruction or inflammatory changes. Please note that
the entire stomach is not visualized. Again noted are two
low-density structures in right hepatic lobe, largest measuring
cm. Findings are most compatible with hepatic cysts. Otherwise,
normal appearance the liver and the portal venous system is patent.
Normal appearance of the gallbladder. There is a normal appearance
of spleen. A small amount of fluid in the left upper quadrant
lateral to the spleen on sequence 2, image 16. Multiple
calcifications near the pancreatic head suggestive for old pancreas
inflammation. Stable fullness of the right adrenal gland is
unchanged. Normal appearance of the left adrenal gland. There is an
8 mm nonobstructive stone in the right kidney lower pole. Normal
appearance of the left kidney.

Atherosclerotic calcifications involving the aorta and iliac
arteries without aneurysm.

No gross abnormality to the urinary bladder or prostate.
Small-moderate amount of fluid along the right side of the pelvis on
sequence 2, image 65. Again noted is extensive wall thickening
involving the sigmoid colon with diverticula. This abnormal wall
thickening in the sigmoid colon involves a 10 cm segment. The
descending colon is moderately distended with stool. There is gas
and stool throughout the transverse colon and right colon. Mild
inflammatory changes around the descending colon, best seen on
sequence 2, image 32. In addition, there are prominent lymph nodes
in the sigmoid mesocolon and along the drainage of the sigmoid
colon, best seen on sequence 2, image 50.

No significant dilatation of small bowel loops. Previously, there
was a small hernia in the left lower abdomen which has either been
repaired or less conspicuous.

No acute bone abnormality. Disc space and endplates changes L4-L5
and L5-S1.
IMPRESSION: Abnormal sigmoid colon with diffuse wall thickening and concern for
a colonic mass. The colon proximal to this abnormal sigmoid colon is
distended with a large amount of stool and pericolonic inflammatory
changes. There is a small amount of fluid in left upper quadrant and
small to moderate amount of fluid in the pelvis. In addition, there
are multiple lymph nodes throughout the sigmoid mesocolon region.
Findings are highly concerning for a neoplastic process in the
sigmoid colon causing at least partial obstruction and inflammatory
changes in the descending colon.

Large hiatal hernia containing most of the stomach.

Nonobstructive right kidney stone.

These results were called by telephone at the time of interpretation
on 09/09/2015 at [DATE] to Dr. MERVEIL OYOA , who verbally
acknowledged these results.

## 2016-10-04 ENCOUNTER — Other Ambulatory Visit: Payer: Self-pay | Admitting: Interventional Cardiology

## 2016-12-27 ENCOUNTER — Ambulatory Visit
Admission: RE | Admit: 2016-12-27 | Discharge: 2016-12-27 | Disposition: A | Discharge status: Home / Self Care | Payer: Medicare Other | Source: Ambulatory Visit | Attending: Nurse Practitioner | Admitting: Nurse Practitioner

## 2016-12-27 ENCOUNTER — Other Ambulatory Visit: Payer: Self-pay | Admitting: Nurse Practitioner

## 2016-12-27 DIAGNOSIS — J069 Acute upper respiratory infection, unspecified: Secondary | ICD-10-CM

## 2017-02-01 ENCOUNTER — Other Ambulatory Visit (INDEPENDENT_AMBULATORY_CARE_PROVIDER_SITE_OTHER): Payer: Self-pay | Admitting: Otolaryngology

## 2017-02-23 ENCOUNTER — Other Ambulatory Visit: Payer: Self-pay | Admitting: Otolaryngology

## 2017-03-15 ENCOUNTER — Encounter (HOSPITAL_BASED_OUTPATIENT_CLINIC_OR_DEPARTMENT_OTHER): Payer: Self-pay | Admitting: *Deleted

## 2017-03-15 ENCOUNTER — Encounter (HOSPITAL_BASED_OUTPATIENT_CLINIC_OR_DEPARTMENT_OTHER)
Admission: RE | Admit: 2017-03-15 | Discharge: 2017-03-15 | Disposition: A | Discharge status: Home / Self Care | Payer: Medicare Other | Source: Ambulatory Visit | Attending: Otolaryngology | Admitting: Otolaryngology

## 2017-03-15 DIAGNOSIS — Z01812 Encounter for preprocedural laboratory examination: Secondary | ICD-10-CM | POA: Insufficient documentation

## 2017-03-15 LAB — BASIC METABOLIC PANEL
ANION GAP: 8 (ref 5–15)
BUN: 14 mg/dL (ref 6–20)
CO2: 27 mmol/L (ref 22–32)
Calcium: 9.3 mg/dL (ref 8.9–10.3)
Chloride: 104 mmol/L (ref 101–111)
Creatinine, Ser: 0.75 mg/dL (ref 0.61–1.24)
GFR calc Af Amer: >60 mL/min (ref >60)
GLUCOSE: 139 mg/dL — AB (ref 65–99)
Potassium: 4.3 mmol/L (ref 3.5–5.1)
Sodium: 139 mmol/L (ref 135–145)

## 2017-03-21 ENCOUNTER — Ambulatory Visit (HOSPITAL_BASED_OUTPATIENT_CLINIC_OR_DEPARTMENT_OTHER): Payer: Medicare Other | Admitting: Anesthesiology

## 2017-03-21 ENCOUNTER — Ambulatory Visit (HOSPITAL_BASED_OUTPATIENT_CLINIC_OR_DEPARTMENT_OTHER)
Admission: RE | Admit: 2017-03-21 | Discharge: 2017-03-21 | Disposition: A | Discharge status: Home / Self Care | Payer: Medicare Other | Source: Ambulatory Visit | Attending: Otolaryngology | Admitting: Otolaryngology

## 2017-03-21 ENCOUNTER — Encounter (HOSPITAL_BASED_OUTPATIENT_CLINIC_OR_DEPARTMENT_OTHER)
Admission: RE | Disposition: A | Discharge status: Home / Self Care | Payer: Self-pay | Source: Ambulatory Visit | Attending: Otolaryngology

## 2017-03-21 ENCOUNTER — Encounter (HOSPITAL_BASED_OUTPATIENT_CLINIC_OR_DEPARTMENT_OTHER): Payer: Self-pay | Admitting: *Deleted

## 2017-03-21 DIAGNOSIS — E118 Type 2 diabetes mellitus with unspecified complications: Secondary | ICD-10-CM | POA: Diagnosis not present

## 2017-03-21 DIAGNOSIS — N289 Disorder of kidney and ureter, unspecified: Secondary | ICD-10-CM | POA: Insufficient documentation

## 2017-03-21 DIAGNOSIS — I25119 Atherosclerotic heart disease of native coronary artery with unspecified angina pectoris: Secondary | ICD-10-CM | POA: Insufficient documentation

## 2017-03-21 DIAGNOSIS — C44219 Basal cell carcinoma of skin of left ear and external auricular canal: Secondary | ICD-10-CM | POA: Diagnosis present

## 2017-03-21 DIAGNOSIS — I10 Essential (primary) hypertension: Secondary | ICD-10-CM | POA: Insufficient documentation

## 2017-03-21 DIAGNOSIS — K219 Gastro-esophageal reflux disease without esophagitis: Secondary | ICD-10-CM | POA: Insufficient documentation

## 2017-03-21 DIAGNOSIS — F17200 Nicotine dependence, unspecified, uncomplicated: Secondary | ICD-10-CM | POA: Insufficient documentation

## 2017-03-21 DIAGNOSIS — K449 Diaphragmatic hernia without obstruction or gangrene: Secondary | ICD-10-CM | POA: Insufficient documentation

## 2017-03-21 HISTORY — PX: MASS EXCISION: SHX2000

## 2017-03-21 HISTORY — DX: Basal cell carcinoma of skin, unspecified: C44.91

## 2017-03-21 HISTORY — PX: SKIN FULL THICKNESS GRAFT: SHX442

## 2017-03-21 LAB — GLUCOSE, CAPILLARY
GLUCOSE-CAPILLARY: 154 mg/dL — AB (ref 65–99)
GLUCOSE-CAPILLARY: 138 mg/dL — AB (ref 65–99)

## 2017-03-21 SURGERY — EXCISION MASS
Anesthesia: General | Site: Ear | Laterality: Left

## 2017-03-21 MED ORDER — FENTANYL CITRATE (PF) 100 MCG/2ML IJ SOLN: 25.0000 ug | INTRAMUSCULAR | Status: DC | PRN

## 2017-03-21 MED ORDER — MIDAZOLAM HCL 2 MG/2ML IJ SOLN: 1.0000 mg | INTRAMUSCULAR | Status: DC | PRN

## 2017-03-21 MED ORDER — DEXAMETHASONE SODIUM PHOSPHATE 4 MG/ML IJ SOLN
INTRAMUSCULAR | Status: DC | PRN
  Administered 2017-03-21: 4 mg via INTRAVENOUS

## 2017-03-21 MED ORDER — CLINDAMYCIN PHOSPHATE 600 MG/50ML IV SOLN
INTRAVENOUS | Status: DC | PRN
  Administered 2017-03-21: 600 mg via INTRAVENOUS

## 2017-03-21 MED ORDER — ONDANSETRON HCL 4 MG/2ML IJ SOLN
INTRAMUSCULAR | Status: DC | PRN
  Administered 2017-03-21: 4 mg via INTRAVENOUS

## 2017-03-21 MED ORDER — SCOPOLAMINE 1 MG/3DAYS TD PT72: 1.0000 | MEDICATED_PATCH | Freq: Once | TRANSDERMAL | Status: DC | PRN

## 2017-03-21 MED ORDER — LIDOCAINE-EPINEPHRINE 1 %-1:100000 IJ SOLN
INTRAMUSCULAR | Status: DC | PRN
  Administered 2017-03-21: 9 mL via INTRADERMAL

## 2017-03-21 MED ORDER — OXYMETAZOLINE HCL 0.05 % NA SOLN
NASAL | Status: AC
  Filled 2017-03-21: qty 45

## 2017-03-21 MED ORDER — FENTANYL CITRATE (PF) 100 MCG/2ML IJ SOLN
INTRAMUSCULAR | Status: AC
  Filled 2017-03-21: qty 2

## 2017-03-21 MED ORDER — CLINDAMYCIN HCL 300 MG PO CAPS: 300.0000 mg | ORAL_CAPSULE | Freq: Three times a day (TID) | ORAL | 0 refills | Status: AC

## 2017-03-21 MED ORDER — SUCCINYLCHOLINE CHLORIDE 200 MG/10ML IV SOSY
PREFILLED_SYRINGE | INTRAVENOUS | Status: AC
  Filled 2017-03-21: qty 30

## 2017-03-21 MED ORDER — BACITRACIN ZINC 500 UNIT/GM EX OINT
TOPICAL_OINTMENT | CUTANEOUS | Status: AC
  Filled 2017-03-21: qty 0.9

## 2017-03-21 MED ORDER — ONDANSETRON HCL 4 MG/2ML IJ SOLN
INTRAMUSCULAR | Status: AC
  Filled 2017-03-21: qty 2

## 2017-03-21 MED ORDER — LIDOCAINE 2% (20 MG/ML) 5 ML SYRINGE
INTRAMUSCULAR | Status: AC
  Filled 2017-03-21: qty 5

## 2017-03-21 MED ORDER — ROCURONIUM BROMIDE 50 MG/5ML IV SOSY
PREFILLED_SYRINGE | INTRAVENOUS | Status: AC
  Filled 2017-03-21: qty 5

## 2017-03-21 MED ORDER — CLINDAMYCIN PHOSPHATE 600 MG/50ML IV SOLN
INTRAVENOUS | Status: AC
  Filled 2017-03-21: qty 50

## 2017-03-21 MED ORDER — OXYCODONE-ACETAMINOPHEN 5-325 MG PO TABS: 1.0000 | ORAL_TABLET | ORAL | 0 refills | Status: DC | PRN

## 2017-03-21 MED ORDER — LACTATED RINGERS IV SOLN
INTRAVENOUS | Status: DC
  Administered 2017-03-21: 08:00:00 via INTRAVENOUS

## 2017-03-21 MED ORDER — LIDOCAINE 2% (20 MG/ML) 5 ML SYRINGE
INTRAMUSCULAR | Status: DC | PRN
Start: 2017-03-21 — End: 2017-03-21
  Administered 2017-03-21: 40 mg via INTRAVENOUS

## 2017-03-21 MED ORDER — PROPOFOL 10 MG/ML IV BOLUS
INTRAVENOUS | Status: DC | PRN
  Administered 2017-03-21: 150 mg via INTRAVENOUS
  Administered 2017-03-21: 20 mg via INTRAVENOUS

## 2017-03-21 MED ORDER — FENTANYL CITRATE (PF) 100 MCG/2ML IJ SOLN
50.0000 ug | INTRAMUSCULAR | Status: AC | PRN
  Administered 2017-03-21 (×4): 25 ug via INTRAVENOUS

## 2017-03-21 MED ORDER — LIDOCAINE-EPINEPHRINE 1 %-1:100000 IJ SOLN
INTRAMUSCULAR | Status: AC
  Filled 2017-03-21: qty 2

## 2017-03-21 MED ORDER — CIPROFLOXACIN-FLUOCINOLONE PF 0.3-0.025 % OT SOLN
OTIC | Status: AC
  Filled 2017-03-21: qty 0.25

## 2017-03-21 MED ORDER — BACITRACIN ZINC 500 UNIT/GM EX OINT
TOPICAL_OINTMENT | CUTANEOUS | Status: AC
  Filled 2017-03-21: qty 28.35

## 2017-03-21 SURGICAL SUPPLY — 106 items
ADH SKN CLS APL DERMABOND .7 (GAUZE/BANDAGES/DRESSINGS)
APL SKNCLS STERI-STRIP NONHPOA (GAUZE/BANDAGES/DRESSINGS)
BALL CTTN LRG ABS STRL LF (GAUZE/BANDAGES/DRESSINGS) ×3
BENZOIN TINCTURE PRP APPL 2/3 (GAUZE/BANDAGES/DRESSINGS) IMPLANT
BLADE CLIPPER SURG (BLADE) IMPLANT
BLADE EAR TYMPAN 2.5 60D BEAV (BLADE) IMPLANT
BLADE NDL 3 SS STRL (BLADE) IMPLANT
BLADE NEEDLE 3 SS STRL (BLADE) IMPLANT
BLADE NEEDLE 3MM SS STRL (BLADE)
BLADE SURG 15 STRL LF DISP TIS (BLADE) ×3 IMPLANT
BLADE SURG 15 STRL SS (BLADE) ×5
BNDG CONFORM 3 STRL LF (GAUZE/BANDAGES/DRESSINGS) IMPLANT
CANISTER SUCT 1200ML W/VALVE (MISCELLANEOUS) ×5 IMPLANT
CLOSURE WOUND 1/2 X4 (GAUZE/BANDAGES/DRESSINGS)
CLOSURE WOUND 1/4X4 (GAUZE/BANDAGES/DRESSINGS)
CORDS BIPOLAR (ELECTRODE) IMPLANT
COTTONBALL LRG STERILE PKG (GAUZE/BANDAGES/DRESSINGS) ×5 IMPLANT
COVER BACK TABLE 60X90IN (DRAPES) ×5 IMPLANT
COVER MAYO STAND STRL (DRAPES) ×5 IMPLANT
DECANTER SPIKE VIAL GLASS SM (MISCELLANEOUS) ×5 IMPLANT
DERMABOND ADVANCED (GAUZE/BANDAGES/DRESSINGS)
DERMABOND ADVANCED .7 DNX12 (GAUZE/BANDAGES/DRESSINGS) IMPLANT
DRAIN JACKSON RD 7FR 3/32 (WOUND CARE) IMPLANT
DRAIN JP 10F RND SILICONE (MISCELLANEOUS) IMPLANT
DRAIN PENROSE 1/2X12 LTX STRL (WOUND CARE) IMPLANT
DRAIN PENROSE 1/4X12 LTX STRL (WOUND CARE) IMPLANT
DRAIN TLS ROUND 10FR (DRAIN) IMPLANT
DRAPE INCISE 23X17 IOBAN STRL (DRAPES)
DRAPE INCISE 23X17 STRL (DRAPES) IMPLANT
DRAPE INCISE IOBAN 23X17 STRL (DRAPES) IMPLANT
DRAPE LAPAROTOMY T 98X78 PEDS (DRAPES) IMPLANT
DRAPE U-SHAPE 76X120 STRL (DRAPES) ×5 IMPLANT
DRSG GLASSCOCK MASTOID ADT (GAUZE/BANDAGES/DRESSINGS) IMPLANT
ELECT COATED BLADE 2.86 ST (ELECTRODE) ×5 IMPLANT
ELECT PAIRED SUBDERMAL (MISCELLANEOUS)
ELECT REM PT RETURN 9FT ADLT (ELECTROSURGICAL) ×5
ELECTRODE PAIRED SUBDERMAL (MISCELLANEOUS) IMPLANT
ELECTRODE REM PT RTRN 9FT ADLT (ELECTROSURGICAL) ×3 IMPLANT
EVACUATOR SILICONE 100CC (DRAIN) IMPLANT
GAUZE SPONGE 4X4 12PLY STRL (GAUZE/BANDAGES/DRESSINGS) IMPLANT
GAUZE SPONGE 4X4 12PLY STRL LF (GAUZE/BANDAGES/DRESSINGS) IMPLANT
GAUZE SPONGE 4X4 16PLY XRAY LF (GAUZE/BANDAGES/DRESSINGS) IMPLANT
GLOVE BIO SURGEON STRL SZ7.5 (GLOVE) ×5 IMPLANT
GLOVE BIOGEL PI IND STRL 7.0 (GLOVE) ×1 IMPLANT
GLOVE BIOGEL PI INDICATOR 7.0 (GLOVE) ×2
GLOVE ECLIPSE 6.5 STRL STRAW (GLOVE) ×3 IMPLANT
GOWN STRL REUS W/ TWL LRG LVL3 (GOWN DISPOSABLE) ×6 IMPLANT
GOWN STRL REUS W/TWL LRG LVL3 (GOWN DISPOSABLE) ×10
HEMOSTAT SURGICEL .5X2 ABSORB (HEMOSTASIS) IMPLANT
HEMOSTAT SURGICEL 2X14 (HEMOSTASIS) IMPLANT
IV CATH AUTO 14GX1.75 SAFE ORG (IV SOLUTION) ×2 IMPLANT
IV NS 500ML (IV SOLUTION)
IV NS 500ML BAXH (IV SOLUTION) ×2 IMPLANT
IV SET EXT 30 76VOL 4 MALE LL (IV SETS) ×2 IMPLANT
NDL HYPO 25X1 1.5 SAFETY (NEEDLE) ×2 IMPLANT
NDL PRECISIONGLIDE 27X1.5 (NEEDLE) ×2 IMPLANT
NDL SAFETY ECLIPSE 18X1.5 (NEEDLE) ×2 IMPLANT
NEEDLE HYPO 18GX1.5 SHARP (NEEDLE)
NEEDLE HYPO 25X1 1.5 SAFETY (NEEDLE) ×5 IMPLANT
NEEDLE PRECISIONGLIDE 27X1.5 (NEEDLE) IMPLANT
NS IRRIG 1000ML POUR BTL (IV SOLUTION) ×5 IMPLANT
PACK BASIN DAY SURGERY FS (CUSTOM PROCEDURE TRAY) ×5 IMPLANT
PACK ENT DAY SURGERY (CUSTOM PROCEDURE TRAY) ×5 IMPLANT
PENCIL BUTTON HOLSTER BLD 10FT (ELECTRODE) ×5 IMPLANT
PIN SAFETY STERILE (MISCELLANEOUS) IMPLANT
PROBE NERVBE PRASS .33 (MISCELLANEOUS) IMPLANT
SHEET MEDIUM DRAPE 40X70 STRL (DRAPES) IMPLANT
SLEEVE SCD COMPRESS KNEE MED (MISCELLANEOUS) IMPLANT
SPONGE GAUZE 2X2 8PLY STER LF (GAUZE/BANDAGES/DRESSINGS) ×1
SPONGE GAUZE 2X2 8PLY STRL LF (GAUZE/BANDAGES/DRESSINGS) ×4 IMPLANT
SPONGE SURGIFOAM ABS GEL 12-7 (HEMOSTASIS) IMPLANT
STAPLER VISISTAT 35W (STAPLE) IMPLANT
STRIP CLOSURE SKIN 1/2X4 (GAUZE/BANDAGES/DRESSINGS) IMPLANT
STRIP CLOSURE SKIN 1/4X4 (GAUZE/BANDAGES/DRESSINGS) IMPLANT
SUCTION FRAZIER HANDLE 10FR (MISCELLANEOUS)
SUCTION TUBE FRAZIER 10FR DISP (MISCELLANEOUS) IMPLANT
SUT BONE WAX W31G (SUTURE) IMPLANT
SUT CHROMIC 3 0 SH 27 (SUTURE) IMPLANT
SUT CHROMIC 4 0 P 3 18 (SUTURE) IMPLANT
SUT ETHILON 3 0 PS 1 (SUTURE) IMPLANT
SUT ETHILON 4 0 PS 2 18 (SUTURE) IMPLANT
SUT ETHILON 5 0 P 3 18 (SUTURE)
SUT ETHILON 5 0 PS 2 18 (SUTURE) IMPLANT
SUT NOVAFIL 5 0 BLK 18 IN P13 (SUTURE) IMPLANT
SUT NYLON ETHILON 5-0 P-3 1X18 (SUTURE) IMPLANT
SUT PLAIN 5 0 P 3 18 (SUTURE) ×6 IMPLANT
SUT PROLENE 4 0 P 3 18 (SUTURE) ×3 IMPLANT
SUT SILK 3 0 TIES 17X18 (SUTURE)
SUT SILK 3-0 18XBRD TIE BLK (SUTURE) IMPLANT
SUT SILK 4 0 P 3 (SUTURE) IMPLANT
SUT SILK 4 0 TIES 17X18 (SUTURE) IMPLANT
SUT VIC AB 3-0 FS2 27 (SUTURE) IMPLANT
SUT VIC AB 3-0 SH 27 (SUTURE)
SUT VIC AB 3-0 SH 27X BRD (SUTURE) ×2 IMPLANT
SUT VIC AB 4-0 RB1 27 (SUTURE)
SUT VIC AB 4-0 RB1 27X BRD (SUTURE) IMPLANT
SUT VICRYL 4-0 PS2 18IN ABS (SUTURE) ×3 IMPLANT
SYR 3ML 18GX1 1/2 (SYRINGE) ×2 IMPLANT
SYR 5ML LL (SYRINGE) IMPLANT
SYR BULB 3OZ (MISCELLANEOUS) ×2 IMPLANT
SYR CONTROL 10ML LL (SYRINGE) ×5 IMPLANT
TOWEL OR 17X24 6PK STRL BLUE (TOWEL DISPOSABLE) ×10 IMPLANT
TRAY DSU PREP LF (CUSTOM PROCEDURE TRAY) ×5 IMPLANT
TUBE CONNECTING 20'X1/4 (TUBING)
TUBE CONNECTING 20X1/4 (TUBING) ×2 IMPLANT
TUBING IRRIGATION (MISCELLANEOUS) IMPLANT

## 2017-03-21 NOTE — Op Note (Signed)
DATE OF PROCEDURE: 03/21/2017  OPERATIVE REPORT   SURGEON: Leta Baptist, MD  PREOPERATIVE DIAGNOSIS: Left ear basal cell carcinoma  POSTOPERATIVE DIAGNOSIS: Left ear basal cell carcinoma  PROCEDURES PERFORMED: 1. Excision of left ear basal cell carcinoma (3 cm), with intraoperative frozen section analysis 2. Full-thickness skin graft from left lower abdominal wall (3x3cm).  ANESTHESIA: General laryngeal mask anesthesia.  COMPLICATIONS: None.  ESTIMATED BLOOD LOSS:Minimal   INDICATION FOR PROCEDURE:  Kenneth [name] is a 83 y.o. male with a nonhealing ulcer on his left posterior auricle. He underwent biopsy of the lesion. The pathology was consistent with basal cell carcinoma.  Based on the above findings, the decision was made for the patient to undergo the above-stated procedures. The risks, benefits, alternatives, and details of the procedures were discussed with the patient. Questions were invited and answered. Informed consent was obtained.  DESCRIPTION OF PROCEDURE: The patient was taken to the operating room and placed supine on the operating table. General laryngeal mask anesthesia was induced by the anesthesiologist.  The patient was positioned and prepped and draped in a standard fashion for a basal cell carcinoma excision surgery. 1% lidocaine with 1-100,000 epinephrine was infiltrated around the left ear lesion. The local anesthesia was also infiltrated at the skin graft donor site.  Attention was first focused on the basal cell carcinoma. Using a #15 blade, a circular incision was made around the basal cell carcinoma. The size of the defect was 3cm in diameter.  6 separate margins were then obtained (anterior superior, anterior inferior, posterior superior, posterior inferior, anterior deep, and posterior deep). All the specimens were sent to the pathology department for analysis. The margins were all negative.  Attention was then focused on obtaining the  skin graft. A 3 x 3 cm circular full-thickness skin graft was obtained in a standard fashion from the left lower quadrant of abdominal wall. The skin around the donor site was carefully undermined. The incision was closed in layers with 4-0 vicryl gut and 4-0 Prolene sutures.  The full-thickness skin graft was then used to cover the surgical defect. The skin graft was sutured in place with 5-0 plain gut sutures. A Xeroform bolster dressing was then applied. That concluded the procedure for the patient. The care of the patient was turned over to the anesthesiologist. The patient was awakened from anesthesia without difficulty. He was extubated and transferred to the recovery room in good condition.  OPERATIVE FINDINGS: Basal cell carcinoma of the posterior left auricle.  SPECIMEN: Basal cell carcinoma and margins.  FOLLOWUP CARE: The patient will be discharged home once he is awake and alert. He will follow up in my office in 1 week.

## 2017-03-21 NOTE — Addendum Note (Signed)
Addendum  created 03/21/17 1246 by Deisy Ozbun W Caedan Sumler, CRNA   Anesthesia Intra Meds edited

## 2017-03-21 NOTE — Anesthesia Preprocedure Evaluation (Addendum)
Anesthesia Evaluation  Patient identified by MRN, date of birth, ID band Patient awake    Reviewed: Allergy & Precautions, NPO status , Patient's Chart, lab work & pertinent test results  History of Anesthesia Complications (+) PONV  Airway Mallampati: II  TM Distance: >3 FB     Dental   Pulmonary Current Smoker,    breath sounds clear to auscultation       Cardiovascular hypertension, + angina + CAD  + dysrhythmias  Rhythm:Regular Rate:Normal     Neuro/Psych    GI/Hepatic Neg liver ROS, hiatal hernia, GERD  ,  Endo/Other  diabetes  Renal/GU Renal disease     Musculoskeletal   Abdominal   Peds  Hematology   Anesthesia Other Findings   Reproductive/Obstetrics                             Anesthesia Physical Anesthesia Plan  ASA: III  Anesthesia Plan: General   Post-op Pain Management:    Induction: Intravenous  Airway Management Planned: LMA  Additional Equipment:   Intra-op Plan:   Post-operative Plan: Extubation in OR  Informed Consent: I have reviewed the patients History and Physical, chart, labs and discussed the procedure including the risks, benefits and alternatives for the proposed anesthesia with the patient or authorized representative who has indicated his/her understanding and acceptance.   Dental advisory given  Plan Discussed with: CRNA, Anesthesiologist and Surgeon  Anesthesia Plan Comments:         Anesthesia Quick Evaluation

## 2017-03-21 NOTE — H&P (Signed)
Cc: Left ear basal cell carcinoma  HPI: The patient is an 83 year old male who returns today for his follow-up evaluation.  He was last seen 2 weeks ago.  At that time, he was noted to have a 1 cm nonhealing ulcerative lesion on the posterior aspect of the left auricle.  The appearance was concerning for skin malignancy.  He underwent a biopsy of the skin lesion.  The pathology was consistent with basal cell carcinoma. The patient returns today reporting no difficulty since his biopsy procedure.  He denies any significant bleeding.  He also denies any change in his hearing.  No other ENT, GI, or respiratory issue noted since the last visit.   Exam: General: Communicates without difficulty, well nourished, no acute distress. Head: Normocephalic, no evidence injury, no tenderness, facial buttresses intact without stepoff. Face/sinus: No tenderness to palpation and percussion. Facial movement is normal and symmetric. Eyes: PERRL, EOMI. No scleral icterus, conjunctivae clear. Neuro: CN II exam reveals vision grossly intact.  No nystagmus at any point of gaze. Ears: Auricles well formed. A 1 cm basal cell carcinoma is noted on the posterior aspect of the left auricle. Ear canals are intact without mass or lesion.  No erythema or edema is appreciated.  The TMs are intact without fluid. Nose: External evaluation reveals normal support and skin without lesions.  Dorsum is intact.  Anterior rhinoscopy reveals healthy pink mucosa over anterior aspect of inferior turbinates and intact septum.  No purulence noted. Oral:  Oral cavity and oropharynx are intact, symmetric, without erythema or edema.  Mucosa is moist without lesions. Neck: Full range of motion without pain.  There is no significant lymphadenopathy.  No masses palpable.  Thyroid bed within normal limits to palpation.  Parotid glands and submandibular glands equal bilaterally without mass.  Trachea is midline. Neuro:  CN 2-12 grossly intact. Gait normal.    Assessment 1.  Basal cell carcinoma of his left posterior auricle.  The size of the lesion is approximately 1 cm in diameter.   Plan  1.  The pathology results are reviewed with the patient.  2.  The patient will need to undergo surgical excision of the basal cell carcinoma.  Intraoperative frozen section analysis will also be performed to ensure clear margins.  The defect will likely need to be reconstructed with a skin graft.   3.  The risks, benefits, alternatives and details of the procedures are reviewed with the patient and his wife.  4.  The patient would like to proceed with the procedure.

## 2017-03-21 NOTE — Anesthesia Postprocedure Evaluation (Addendum)
Anesthesia Post Note  Patient: EDEL RIVERO  Procedure(s) Performed: Procedure(s) (LRB): EXCISION BASAL CELL CARCINOMA LEFT EAR (Left) FULL THICKNESS SKIN GRAFT TO LEFT EAR (Left)  Patient location during evaluation: PACU Anesthesia Type: General Level of consciousness: awake and alert, patient cooperative and oriented Pain management: pain level controlled Vital Signs Assessment: post-procedure vital signs reviewed and stable Respiratory status: spontaneous breathing, nonlabored ventilation and respiratory function stable Cardiovascular status: blood pressure returned to baseline and stable Postop Assessment: no signs of nausea or vomiting Anesthetic complications: no       Last Vitals:  Vitals:   03/21/17 1100 03/21/17 1115  BP: 104/88 (!) 149/55  Pulse: (!) 53 (!) 50  Resp: 14 13  Temp:      Last Pain:  Vitals:   03/21/17 1115  TempSrc:   PainSc: 0-No pain                 Natahsa Marian,E. Zamyiah Tino

## 2017-03-21 NOTE — Transfer of Care (Signed)
Immediate Anesthesia Transfer of Care Note  Patient: Kenneth [name]  Procedure(s) Performed: Procedure(s): EXCISION BASAL CELL CARCINOMA LEFT EAR (Left) FULL THICKNESS SKIN GRAFT TO LEFT EAR (Left)  Patient Location: PACU  Anesthesia Type:General  Level of Consciousness: awake  Airway & Oxygen Therapy: Patient Spontanous Breathing and Patient connected to face mask oxygen  Post-op Assessment: Report given to RN and Post -op Vital signs reviewed and stable  Post vital signs: Reviewed and stable  Last Vitals:  Vitals:   03/21/17 0753  BP: 140/62  Pulse: (!) 54  Resp: 18  Temp: 36.6 C    Last Pain:  Vitals:   03/21/17 0753  TempSrc: Oral      Patients Stated Pain Goal: 0 (00/34/91 [id number])  Complications: No apparent anesthesia complications

## 2017-03-21 NOTE — Anesthesia Procedure Notes (Signed)
Procedure Name: LMA Insertion Date/Time: 03/21/2017 8:59 AM Performed by: Lieutenant Diego Pre-anesthesia Checklist: Patient identified, Emergency Drugs available, Suction available and Patient being monitored Patient Re-evaluated:Patient Re-evaluated prior to inductionOxygen Delivery Method: Circle system utilized Preoxygenation: Pre-oxygenation with 100% oxygen Intubation Type: IV induction Ventilation: Mask ventilation without difficulty LMA: LMA inserted LMA Size: 4.0 Number of attempts: 1 Airway Equipment and Method: Bite block Placement Confirmation: positive ETCO2 and breath sounds checked- equal and bilateral Tube secured with: Tape Dental Injury: Teeth and Oropharynx as per pre-operative assessment

## 2017-03-21 NOTE — Discharge Instructions (Addendum)
°  Post Anesthesia Home Care Instructions  Activity: Get plenty of rest for the remainder of the day. A responsible individual must stay with you for 24 hours following the procedure.  For the next 24 hours, DO NOT: -Drive a car -Paediatric nurse -Drink alcoholic beverages -Take any medication unless instructed by your physician -Make any legal decisions or sign important papers.  Meals: Start with liquid foods such as gelatin or soup. Progress to regular foods as tolerated. Avoid greasy, spicy, heavy foods. If nausea and/or vomiting occur, drink only clear liquids until the nausea and/or vomiting subsides. Call your physician if vomiting continues.  Special Instructions/Symptoms: Your throat may feel dry or sore from the anesthesia or the breathing tube placed in your throat during surgery. If this causes discomfort, gargle with warm salt water. The discomfort should disappear within 24 hours.  If you had a scopolamine patch placed behind your ear for the management of post- operative nausea and/or vomiting:  1. The medication in the patch is effective for 72 hours, after which it should be removed.  Wrap patch in a tissue and discard in the trash. Wash hands thoroughly with soap and water. 2. You may remove the patch earlier than 72 hours if you experience unpleasant side effects which may include dry mouth, dizziness or visual disturbances. 3. Avoid touching the patch. Wash your hands with soap and water after contact with the patch.    ----------------  The patient is instructed to leave his left ear bolster dressing in place. He may resume his regular diet. The patient should avoid heavy lifting (more than 25 pounds) for 5 days. He will follow up in my office in one week.

## 2017-03-22 ENCOUNTER — Encounter (HOSPITAL_BASED_OUTPATIENT_CLINIC_OR_DEPARTMENT_OTHER): Payer: Self-pay | Admitting: Otolaryngology

## 2017-06-23 NOTE — Addendum Note (Signed)
Addendum  created 06/23/17 1738 by Annye Asa, MD   Sign clinical note

## 2017-09-10 ENCOUNTER — Encounter (HOSPITAL_COMMUNITY): Payer: Self-pay | Admitting: Radiology

## 2017-09-10 ENCOUNTER — Emergency Department (HOSPITAL_COMMUNITY)
Admission: EM | Admit: 2017-09-10 | Discharge: 2017-09-10 | Disposition: A | Discharge status: Home / Self Care | Payer: Medicare Other | Attending: Emergency Medicine | Admitting: Emergency Medicine

## 2017-09-10 ENCOUNTER — Emergency Department (HOSPITAL_COMMUNITY): Payer: Medicare Other

## 2017-09-10 DIAGNOSIS — I2510 Atherosclerotic heart disease of native coronary artery without angina pectoris: Secondary | ICD-10-CM | POA: Insufficient documentation

## 2017-09-10 DIAGNOSIS — Z7984 Long term (current) use of oral hypoglycemic drugs: Secondary | ICD-10-CM | POA: Insufficient documentation

## 2017-09-10 DIAGNOSIS — R112 Nausea with vomiting, unspecified: Secondary | ICD-10-CM

## 2017-09-10 DIAGNOSIS — E785 Hyperlipidemia, unspecified: Secondary | ICD-10-CM | POA: Insufficient documentation

## 2017-09-10 DIAGNOSIS — Z951 Presence of aortocoronary bypass graft: Secondary | ICD-10-CM | POA: Diagnosis not present

## 2017-09-10 DIAGNOSIS — R1031 Right lower quadrant pain: Secondary | ICD-10-CM | POA: Diagnosis not present

## 2017-09-10 DIAGNOSIS — Z79899 Other long term (current) drug therapy: Secondary | ICD-10-CM | POA: Insufficient documentation

## 2017-09-10 DIAGNOSIS — E119 Type 2 diabetes mellitus without complications: Secondary | ICD-10-CM | POA: Diagnosis not present

## 2017-09-10 DIAGNOSIS — F17290 Nicotine dependence, other tobacco product, uncomplicated: Secondary | ICD-10-CM | POA: Insufficient documentation

## 2017-09-10 DIAGNOSIS — R109 Unspecified abdominal pain: Secondary | ICD-10-CM

## 2017-09-10 DIAGNOSIS — I10 Essential (primary) hypertension: Secondary | ICD-10-CM | POA: Diagnosis not present

## 2017-09-10 LAB — COMPREHENSIVE METABOLIC PANEL
ALBUMIN: 3.6 g/dL (ref 3.5–5.0)
ALK PHOS: 80 U/L (ref 38–126)
ALT: 16 U/L — ABNORMAL LOW (ref 17–63)
ANION GAP: 9 (ref 5–15)
AST: 21 U/L (ref 15–41)
BUN: 17 mg/dL (ref 6–20)
CHLORIDE: 103 mmol/L (ref 101–111)
CO2: 26 mmol/L (ref 22–32)
Calcium: 9.4 mg/dL (ref 8.9–10.3)
Creatinine, Ser: 0.92 mg/dL (ref 0.61–1.24)
GFR calc Af Amer: >60 mL/min (ref >60)
GFR calc non Af Amer: >60 mL/min (ref >60)
GLUCOSE: 250 mg/dL — AB (ref 65–99)
POTASSIUM: 3.5 mmol/L (ref 3.5–5.1)
SODIUM: 138 mmol/L (ref 135–145)
Total Bilirubin: 0.6 mg/dL (ref 0.3–1.2)
Total Protein: 6.6 g/dL (ref 6.5–8.1)

## 2017-09-10 LAB — I-STAT CG4 LACTIC ACID, ED
Lactic Acid, Venous: 2.68 mmol/L (ref 0.5–1.9)
Lactic Acid, Venous: 1.82 mmol/L (ref 0.5–1.9)

## 2017-09-10 LAB — URINALYSIS, ROUTINE W REFLEX MICROSCOPIC
BACTERIA UA: NONE SEEN
BILIRUBIN URINE: NEGATIVE
GLUCOSE, UA: 150 mg/dL — AB
KETONES UR: 5 mg/dL — AB
Leukocytes, UA: NEGATIVE
NITRITE: NEGATIVE
PROTEIN: NEGATIVE mg/dL
Specific Gravity, Urine: 1.015 (ref 1.005–1.030)
pH: 6 (ref 5.0–8.0)

## 2017-09-10 LAB — CBC
HEMATOCRIT: 36.7 % — AB (ref 39.0–52.0)
HEMOGLOBIN: 12.1 g/dL — AB (ref 13.0–17.0)
MCH: 31.7 pg (ref 26.0–34.0)
MCHC: 33 g/dL (ref 30.0–36.0)
MCV: 96.1 fL (ref 78.0–100.0)
PLATELETS: 355 10*3/uL (ref 150–400)
RBC: 3.82 MIL/uL — ABNORMAL LOW (ref 4.22–5.81)
RDW: 13.1 % (ref 11.5–15.5)
WBC: 12.9 10*3/uL — ABNORMAL HIGH (ref 4.0–10.5)

## 2017-09-10 LAB — I-STAT TROPONIN, ED: TROPONIN I, POC: 0.01 ng/mL (ref 0.00–0.08)

## 2017-09-10 LAB — LIPASE, BLOOD: LIPASE: 22 U/L (ref 11–51)

## 2017-09-10 MED ORDER — SODIUM CHLORIDE 0.9 % IV BOLUS (SEPSIS)
1000.0000 mL | Freq: Once | INTRAVENOUS | Status: AC
  Administered 2017-09-10: 1000 mL via INTRAVENOUS

## 2017-09-10 MED ORDER — AMLODIPINE BESYLATE 5 MG PO TABS
10.0000 mg | ORAL_TABLET | Freq: Once | ORAL | Status: AC
  Administered 2017-09-10: 10 mg via ORAL
  Filled 2017-09-10: qty 2

## 2017-09-10 MED ORDER — HYDROMORPHONE HCL 1 MG/ML IJ SOLN: 0.5000 mg | Freq: Once | INTRAMUSCULAR | Status: DC

## 2017-09-10 MED ORDER — IOPAMIDOL (ISOVUE-300) INJECTION 61%
INTRAVENOUS | Status: AC
  Administered 2017-09-10: 100 mL
  Filled 2017-09-10: qty 100

## 2017-09-10 MED ORDER — HYDROCHLOROTHIAZIDE 12.5 MG PO CAPS
12.5000 mg | ORAL_CAPSULE | Freq: Every day | ORAL | Status: DC
  Administered 2017-09-10: 12.5 mg via ORAL
  Filled 2017-09-10: qty 1

## 2017-09-10 MED ORDER — HYDROMORPHONE HCL 1 MG/ML IJ SOLN
0.5000 mg | Freq: Once | INTRAMUSCULAR | Status: AC
  Administered 2017-09-10: 0.5 mg via INTRAVENOUS
  Filled 2017-09-10: qty 1

## 2017-09-10 MED ORDER — FENTANYL CITRATE (PF) 100 MCG/2ML IJ SOLN
50.0000 ug | INTRAMUSCULAR | Status: DC | PRN
  Administered 2017-09-10: 50 ug via INTRAVENOUS
  Filled 2017-09-10: qty 2

## 2017-09-10 MED ORDER — ONDANSETRON HCL 4 MG/2ML IJ SOLN
4.0000 mg | Freq: Once | INTRAMUSCULAR | Status: AC | PRN
  Administered 2017-09-10: 4 mg via INTRAVENOUS
  Filled 2017-09-10: qty 2

## 2017-09-10 NOTE — ED Notes (Signed)
Assisted pt with urinal

## 2017-09-10 NOTE — ED Notes (Signed)
ED Provider at bedside. 

## 2017-09-10 NOTE — ED Notes (Signed)
Patient transported to ct

## 2017-09-10 NOTE — ED Notes (Signed)
PA at bedside.

## 2017-09-10 NOTE — Discharge Instructions (Signed)
Drink plenty of fluids Return for worsening symptoms

## 2017-09-10 NOTE — ED Provider Notes (Signed)
[facility] DEPT Provider Note   CSN: [id number] Arrival date & time: 09/10/17  0820     History   Chief Complaint No chief complaint on file.   HPI Kenneth [name] is a 83 y.o. male who presents with abdominal pain. PMH significant for CAD s/p CABG, Type 2 DM, HTN, HLD, and hx of severe diverticulitis. Past surgical hx significant for colectomy and several hernia repairs. He states the pain started acutely at 2AM. It is in the RLQ and radiates from the R flank.  He also feels like it radiates to the right thigh. It feels achy and is constant. The pain is severe. He has not had pain like this before. He reports associated nausea and vomiting and feels clammy. He also is have a "little" chest pain and feels SOB. No fever, chills, syncope, cough, diarrhea, constipation, dysuria. He has urinary frequency at baseline.   HPI  Past Medical History:  Diagnosis Date  . Anemia    on iron  . Anginal pain ([place])    last 1-2 MONTHS ago goes away if stop and rest  . Anxiety   . Arthritis   . Basal cell carcinoma    left ear  . CAD (coronary artery disease)   . Chronic cough   . Chronic rhinitis   . Complication of anesthesia   . Coronary atherosclerosis of unspecified type of vessel, native or graft    CABG 1993, LIMA to LAD, SVG to obtuse marginal one, SVG to posterior descending, stable angina  . Diabetes ([place])   . Difficulty sleeping   . Diverticulosis of colon   . Dizziness   . Dysrhythmia    occ "skips a beat"  . GERD (gastroesophageal reflux disease)   . Glaucoma   . H/O hiatal hernia   . Hernia    left side  . History of gout   . History of kidney stones   . HTN (hypertension)   . Hyperlipidemia `  . Melanoma of skin, site unspecified   . Numbness    TOES  . PONV (postoperative nausea and vomiting)   . [facility] spotted fever 2-3 yrs ago  . Thyroid disease     Patient Active Problem List   Diagnosis Date Noted  . Colostomy status ([place]) 03/16/2016  . CAD  (coronary artery disease) 09/13/2015  . Elevated troponin 09/13/2015  . Hemoptysis   . Nausea and vomiting 09/09/2015  . Small bowel obstruction ([place]) 09/09/2015  . Hypertensive urgency 09/09/2015  . Type II diabetes mellitus with ophthalmic manifestations, glaucoma 09/09/2015  . Colonic mass   . Intestinal obstruction ([place])   . Preop cardiovascular exam 06/27/2014  . Left inguinal hernia 06/26/2014  . Dysphagia 09/28/2013    Class: Chronic  . Abnormal weight loss 09/28/2013    Class: Chronic  . HTN (hypertension)   . Coronary atherosclerosis of autologous vein bypass graft   . Hyperlipidemia   . GERD (gastroesophageal reflux disease)   . Nephrolithiasis   . Melanoma of skin, site unspecified   . Glaucoma     Past Surgical History:  Procedure Laterality Date  . COLONOSCOPY WITH PROPOFOL N/A 11/27/2013   Procedure: COLONOSCOPY WITH PROPOFOL;  Surgeon: Garlan Fair, MD;  Location: [facility];  Service: Endoscopy;  Laterality: N/A;  . COLOSTOMY TAKEDOWN N/A 03/16/2016   Procedure: COLOSTOMY TAKEDOWN;  Surgeon: Excell Seltzer, MD;  Location: [facility] ORS;  Service: General;  Laterality: N/A;  . CORONARY ARTERY BYPASS GRAFT  1990's   x  3  . HERNIA REPAIR  1970's,17   lft ing hernia  . INGUINAL HERNIA REPAIR Left 06/28/2014   Procedure: Left  INGUINAL HERNIA REPAIR ;  Surgeon: Merrie Roof, MD;  Location: [facility];  Service: General;  Laterality: Left;  . INSERTION OF MESH Left 06/28/2014   Procedure: INSERTION OF MESH;  Surgeon: Merrie Roof, MD;  Location: [facility];  Service: General;  Laterality: Left;  . INSERTION OF MESH N/A 10/11/2014  . LAPAROSCOPY  10/11/2014  . LAPAROTOMY N/A 09/11/2015   Procedure: EXPLORATORY LAPAROTOMY;  Surgeon: Excell Seltzer, MD;  Location: [facility];  Service: General;  Laterality: N/A;  . MASS EXCISION Left 03/21/2017   Procedure: EXCISION BASAL CELL CARCINOMA LEFT EAR;  Surgeon: Leta Baptist, MD;  Location: [place];  Service: ENT;   Laterality: Left;  . PARTIAL COLECTOMY N/A 09/11/2015   Procedure: SIGMOID COLECTOMY AND COLOSTOMY;  Surgeon: Excell Seltzer, MD;  Location: [facility];  Service: General;  Laterality: N/A;  . right leg fracture age 10    . SKIN FULL THICKNESS GRAFT Left 03/21/2017   Procedure: FULL THICKNESS SKIN GRAFT TO LEFT EAR;  Surgeon: Leta Baptist, MD;  Location: [place];  Service: ENT;  Laterality: Left;  . TONSILLECTOMY  age 70 or 52  . VENTRAL HERNIA REPAIR  10/11/2014   Procedure: HERNIA REPAIR VENTRAL ADULT;  Surgeon: Autumn Messing III, MD;  Location: [place];  Service: General;;       Home Medications    Prior to Admission medications   Medication Sig Start Date End Date Taking? Authorizing Provider  acetaminophen (TYLENOL) 325 MG tablet Take 325 mg by mouth every 6 (six) hours as needed for mild pain.     [provider]  amLODipine (NORVASC) 10 MG tablet Take 10 mg by mouth daily.    [provider]  Cyanocobalamin (VITAMIN B-12 PO) Take 1 tablet by mouth daily.    [provider]  ferrous sulfate 325 (65 FE) MG tablet Take 325 mg by mouth daily with breakfast.    [provider]  hydrochlorothiazide (HYDRODIURIL) 25 MG tablet Take 12.5 mg by mouth daily.  10/02/14   [provider]  ipratropium (ATROVENT) 0.03 % nasal spray Place 2 sprays into both nostrils 2 (two) times daily as needed for rhinitis.    [provider]  isosorbide mononitrate (IMDUR) 60 MG 24 hr tablet Take 60 mg by mouth every evening.     [provider]  latanoprost (XALATAN) 0.005 % ophthalmic solution Place 1 drop into both eyes at bedtime.    [provider]  levothyroxine (SYNTHROID, LEVOTHROID) 50 MCG tablet Take 50 mcg by mouth daily before breakfast.    [provider]  metFORMIN (GLUCOPHAGE) 1000 MG tablet Take 500 mg by mouth daily with breakfast.     [provider]  metoprolol succinate (TOPROL-XL) 50 MG 24 hr tablet  Take 25 mg by mouth daily. Take with or immediately following a meal.    [provider]  metoprolol succinate (TOPROL-XL) 50 MG 24 hr tablet TAKE 1 TABLET DAILY 10/04/16   Belva Crome, MD  Multiple Vitamin (MULTIVITAMIN WITH MINERALS) TABS tablet Take 1 tablet by mouth daily.    [provider]  nitroGLYCERIN (NITROSTAT) 0.4 MG SL tablet Place 1 tablet (0.4 mg total) under the tongue every 5 (five) minutes as needed for chest pain. 10/09/15   Eileen Stanford, PA-C  oxyCODONE-acetaminophen (ROXICET) 5-325 MG  tablet Take 1 tablet by mouth every 4 (four) hours as needed for severe pain. 03/21/17   Leta Baptist, MD  pantoprazole (PROTONIX) 40 MG tablet Take 40 mg by mouth daily.    [provider]  potassium chloride (K-DUR,KLOR-CON) 10 MEQ tablet Take 10 mEq by mouth daily.     [provider]  simvastatin (ZOCOR) 20 MG tablet Take 20 mg by mouth every evening.    [provider]    Family History Family History  Problem Relation Age of Onset  . CAD Father   . Hyperlipidemia Father   . Cancer Mother     Social History Social History  Substance Use Topics  . Smoking status: Light Tobacco Smoker    Types: Cigars  . Smokeless tobacco: Never Used  . Alcohol use Yes     Comment: RARE     Allergies   Aspirin; Lipitor [atorvastatin]; Lopressor [metoprolol tartrate]; and Penicillins   Review of Systems Review of Systems  Constitutional: Negative for chills and fever.  Respiratory: Positive for shortness of breath. Negative for cough.   Cardiovascular: Positive for chest pain. Negative for leg swelling.  Gastrointestinal: Positive for abdominal pain, nausea and vomiting. Negative for constipation and diarrhea.  Genitourinary: Positive for flank pain.  Musculoskeletal: Negative for back pain.  Neurological: Negative for syncope and light-headedness.  All other systems reviewed and are negative.    Physical Exam Updated Vital Signs BP  (!) 208/63 (BP Location: Right Arm)   Pulse (!) 47   Temp 97.9 F (36.6 C) (Oral)   Resp 17   SpO2 98%   Physical Exam  Constitutional: He is oriented to person, place, and time. He appears well-developed and well-nourished. No distress.  HENT:  Head: Normocephalic and atraumatic.  Eyes: Pupils are equal, round, and reactive to light. Conjunctivae are normal. Right eye exhibits no discharge. Left eye exhibits no discharge. No scleral icterus.  Neck: Normal range of motion.  Cardiovascular: Intact distal pulses.  Bradycardia present.  Exam reveals no gallop and no friction rub.   Murmur heard. Pulmonary/Chest: Effort normal and breath sounds normal. No respiratory distress. He has no wheezes. He has no rales. He exhibits no tenderness.  Abdominal: Soft. Bowel sounds are normal. He exhibits no distension and no mass. There is tenderness (RLQ tenderness). There is CVA tenderness (on R side). There is no rebound and no guarding. No hernia.  Multiple surgical scars  Genitourinary:  Genitourinary Comments: Rectal: No gross blood, hemorrhoids, fissures, redness, area of fluctuance, lesions, or tenderness. Chaperone present during exam.   Neurological: He is alert and oriented to person, place, and time.  Skin: Skin is warm and dry.  Psychiatric: He has a normal mood and affect. His behavior is normal.  Nursing note and vitals reviewed.    ED Treatments / Results  Labs (all labs ordered are listed, but only abnormal results are displayed) Labs Reviewed  COMPREHENSIVE METABOLIC PANEL - Abnormal; Notable for the following:       Result Value   Glucose, Bld 250 (*)    ALT 16 (*)    All other components within normal limits  CBC - Abnormal; Notable for the following:    WBC 12.9 (*)    RBC 3.82 (*)    Hemoglobin 12.1 (*)    HCT 36.7 (*)    All other components within normal limits  URINALYSIS, ROUTINE W REFLEX MICROSCOPIC - Abnormal; Notable for the following:    Glucose, UA 150 (*)  Hgb urine dipstick MODERATE (*)    Ketones, ur 5 (*)    Squamous Epithelial / LPF 0-5 (*)    All other components within normal limits  I-STAT CG4 LACTIC ACID, ED - Abnormal; Notable for the following:    Lactic Acid, Venous 2.68 (*)    All other components within normal limits  LIPASE, BLOOD  I-STAT TROPONIN, ED  I-STAT CG4 LACTIC ACID, ED    EKG  EKG Interpretation  Date/Time:  Saturday September 10 2017 08:35:36 EDT Ventricular Rate:  46 PR Interval:    QRS Duration: 112 QT Interval:  510 QTC Calculation: 447 R Axis:   22 Text Interpretation:  Sinus bradycardia Incomplete right bundle branch block Since last EKG, TW peaking in anterior leads slightly more evident Confirmed by Duffy Bruce [phone]) on 09/10/2017 9:33:01 AM       Radiology Ct Abdomen Pelvis W Contrast  Result Date: 09/10/2017 CLINICAL DATA:  Patient has some of the pain for the past month. He has had several abdominal surgeries in the past. EXAM: CT ABDOMEN AND PELVIS WITH CONTRAST TECHNIQUE: Multidetector CT imaging of the abdomen and pelvis was performed using the standard protocol following bolus administration of intravenous contrast. CONTRAST:  153mL ISOVUE-300 IOPAMIDOL (ISOVUE-300) INJECTION 61% COMPARISON:  09/09/2015 FINDINGS: Lower chest: No acute abnormality. Hepatobiliary: Diffuse low attenuation liver as can be seen with hepatic steatosis. 2.3 x 2.4 cm hypodense, fluid attenuating right hepatic mass most consistent with a cyst. More inferiorly adjacent to the gallbladder fossa there is a 2.3 x 1.5 cm hypodense, fluid attenuating right hepatic mass most consistent with a cyst. No other hepatic masses. Normal gallbladder. No intrahepatic or extrahepatic biliary ductal dilatation. Pancreas: Unremarkable. No pancreatic ductal dilatation or surrounding inflammatory changes. Spleen: Normal in size without focal abnormality. Adrenals/Urinary Tract: Adrenal glands are unremarkable. No renal mass. No  hydronephrosis. Nonobstructing bilateral renal calculi. Bladder is unremarkable. Stomach/Bowel: Large hiatal hernia with the entirety of the stomach within the thorax. Appendix appears normal. No evidence of bowel wall thickening, distention, or inflammatory changes. Diverticulosis without evidence of diverticulitis. Vascular/Lymphatic: Abdominal aortic atherosclerosis. Normal caliber abdominal aorta. Reproductive: Prostate is unremarkable. Other: No abdominal wall hernia or abnormality. No abdominopelvic ascites. Musculoskeletal: No acute osseous abnormality. No aggressive lytic or sclerotic osseous lesion. Degenerative disc disease with disc height loss at L4-5 and L5-S1. Bilateral facet arthropathy at L4-5 and L5-S1. IMPRESSION: 1. No acute abdominal or pelvic pathology. 2. Large hiatal hernia with the entirety of the stomach within the thorax. 3. Hepatic steatosis. 4.  Aortic Atherosclerosis (ICD10-170.0) Electronically Signed   By: Kathreen Devoid   On: 09/10/2017 12:15   Dg Chest Port 1 View  Result Date: 09/10/2017 CLINICAL DATA:  83 year old male with nausea and vomiting EXAM: PORTABLE CHEST 1 VIEW COMPARISON:  Prior chest x-ray 12/27/2016 FINDINGS: Large hiatal hernia with partially intrathoracic stomach. This appear similar compared to prior. Otherwise, the cardiac and mediastinal contours are within normal limits. Patient is status post median sternotomy with evidence of prior multivessel CABG including LIMA bypass. Atherosclerotic calcifications are present in the transverse aorta. No evidence of pulmonary edema, pleural effusion or pneumothorax. No focal airspace consolidation to suggest pneumonia. No acute osseous abnormality. IMPRESSION: 1. No acute cardiopulmonary process. 2. Similar appearance of large hiatal hernia with partially intrathoracic stomach. 3.  Aortic Atherosclerosis (ICD10-170.0) Electronically Signed   By: Jacqulynn Cadet M.D.   On: 09/10/2017 09:51    Procedures Procedures  (including critical care time)  Medications Ordered in ED  Medications  fentaNYL (SUBLIMAZE) injection 50 mcg (50 mcg Intravenous Given 09/10/17 0912)  hydrochlorothiazide (MICROZIDE) capsule 12.5 mg (12.5 mg Oral Given 09/10/17 1048)  ondansetron (ZOFRAN) injection 4 mg (4 mg Intravenous Given 09/10/17 0912)  sodium chloride 0.9 % bolus 1,000 mL (0 mLs Intravenous Stopped 09/10/17 1122)  iopamidol (ISOVUE-300) 61 % injection (100 mLs  Contrast Given 09/10/17 1140)  HYDROmorphone (DILAUDID) injection 0.5 mg (0.5 mg Intravenous Given 09/10/17 1042)  amLODipine (NORVASC) tablet 10 mg (10 mg Oral Given 09/10/17 1048)     Initial Impression / Assessment and Plan / ED Course  I have reviewed the triage vital signs and the nursing notes.  Pertinent labs & imaging results that were available during my care of the patient were reviewed by me and considered in my medical decision making (see chart for details).  16:86 AM 83 year old male presents with RLQ pain radiating to groin and flank. He is markedly hypertensive which is likely due to pain. He is bradycardic - he is on a BB. Otherwise vitals are normal. Will give pain medicine, anti-emetics, and obtain labs and imaging.  10:41 AM Pain is now over suprapubic area. Pain got better with medicine but is now returning. CBC shows leukocytosis of 12.9 with mild anemia. CMP shows hyperglycemia of 250. EKG is sinus bradycardia and troponin is 0.01. CXR shows large stable hiatal hernia. Initial lactic acid is 2.68 which is likely due to dehydration. Fluids given. UA shows 150 glucose, 5 ketones, moderate hgb, and TNTC RBC. CT is pending  12:00 PM CT is overall unremarkable. On recheck pt is still complaining of pain. Rectal exam was performed which was unremarkable. Will recheck.  1:00 PM Pt reports being relatively pain free. Still has some "soreness" but feels a lot better and is no longer nauseous. I think he most likely passed a kidney stone. He has tolerated  PO. Will d/c with strict return precautions.   Final Clinical Impressions(s) / ED Diagnoses   Final diagnoses:  None    New Prescriptions New Prescriptions   No medications on file     Recardo Evangelist, PA-C 09/10/17 1526    Duffy Bruce, MD 09/11/17 [phone]

## 2017-09-11 ENCOUNTER — Encounter (HOSPITAL_COMMUNITY): Payer: Self-pay | Admitting: *Deleted

## 2017-09-11 ENCOUNTER — Emergency Department (HOSPITAL_COMMUNITY)
Admission: EM | Admit: 2017-09-11 | Discharge: 2017-09-11 | Disposition: A | Discharge status: Home / Self Care | Payer: Medicare Other | Attending: Emergency Medicine | Admitting: Emergency Medicine

## 2017-09-11 DIAGNOSIS — Z85828 Personal history of other malignant neoplasm of skin: Secondary | ICD-10-CM | POA: Insufficient documentation

## 2017-09-11 DIAGNOSIS — I2510 Atherosclerotic heart disease of native coronary artery without angina pectoris: Secondary | ICD-10-CM | POA: Insufficient documentation

## 2017-09-11 DIAGNOSIS — E039 Hypothyroidism, unspecified: Secondary | ICD-10-CM | POA: Insufficient documentation

## 2017-09-11 DIAGNOSIS — I10 Essential (primary) hypertension: Secondary | ICD-10-CM | POA: Diagnosis not present

## 2017-09-11 DIAGNOSIS — Z7984 Long term (current) use of oral hypoglycemic drugs: Secondary | ICD-10-CM | POA: Diagnosis not present

## 2017-09-11 DIAGNOSIS — K5909 Other constipation: Secondary | ICD-10-CM | POA: Insufficient documentation

## 2017-09-11 DIAGNOSIS — Z79899 Other long term (current) drug therapy: Secondary | ICD-10-CM | POA: Diagnosis not present

## 2017-09-11 DIAGNOSIS — F17210 Nicotine dependence, cigarettes, uncomplicated: Secondary | ICD-10-CM | POA: Diagnosis not present

## 2017-09-11 DIAGNOSIS — R1031 Right lower quadrant pain: Secondary | ICD-10-CM | POA: Insufficient documentation

## 2017-09-11 DIAGNOSIS — R112 Nausea with vomiting, unspecified: Secondary | ICD-10-CM | POA: Insufficient documentation

## 2017-09-11 DIAGNOSIS — Z87442 Personal history of urinary calculi: Secondary | ICD-10-CM | POA: Diagnosis not present

## 2017-09-11 DIAGNOSIS — Z85820 Personal history of malignant melanoma of skin: Secondary | ICD-10-CM | POA: Diagnosis not present

## 2017-09-11 DIAGNOSIS — E119 Type 2 diabetes mellitus without complications: Secondary | ICD-10-CM | POA: Insufficient documentation

## 2017-09-11 DIAGNOSIS — R319 Hematuria, unspecified: Secondary | ICD-10-CM | POA: Diagnosis not present

## 2017-09-11 LAB — URINALYSIS, ROUTINE W REFLEX MICROSCOPIC
BILIRUBIN URINE: NEGATIVE
Bacteria, UA: NONE SEEN
Glucose, UA: NEGATIVE mg/dL
Ketones, ur: 5 mg/dL — AB
NITRITE: NEGATIVE
PH: 5 (ref 5.0–8.0)
Protein, ur: 30 mg/dL — AB
SPECIFIC GRAVITY, URINE: 1.024 (ref 1.005–1.030)
SQUAMOUS EPITHELIAL / LPF: NONE SEEN

## 2017-09-11 LAB — CBC
HCT: 38.4 % — ABNORMAL LOW (ref 39.0–52.0)
Hemoglobin: 12.4 g/dL — ABNORMAL LOW (ref 13.0–17.0)
MCH: 31.3 pg (ref 26.0–34.0)
MCHC: 32.3 g/dL (ref 30.0–36.0)
MCV: 97 fL (ref 78.0–100.0)
PLATELETS: 338 10*3/uL (ref 150–400)
RBC: 3.96 MIL/uL — AB (ref 4.22–5.81)
RDW: 13.3 % (ref 11.5–15.5)
WBC: 11.8 10*3/uL — AB (ref 4.0–10.5)

## 2017-09-11 LAB — COMPREHENSIVE METABOLIC PANEL
ALK PHOS: 84 U/L (ref 38–126)
ALT: 14 U/L — AB (ref 17–63)
AST: 24 U/L (ref 15–41)
Albumin: 3.8 g/dL (ref 3.5–5.0)
Anion gap: 10 (ref 5–15)
BILIRUBIN TOTAL: 0.8 mg/dL (ref 0.3–1.2)
BUN: 19 mg/dL (ref 6–20)
CALCIUM: 9.5 mg/dL (ref 8.9–10.3)
CHLORIDE: 103 mmol/L (ref 101–111)
CO2: 24 mmol/L (ref 22–32)
CREATININE: 0.93 mg/dL (ref 0.61–1.24)
Glucose, Bld: 170 mg/dL — ABNORMAL HIGH (ref 65–99)
Potassium: 3.8 mmol/L (ref 3.5–5.1)
Sodium: 137 mmol/L (ref 135–145)
TOTAL PROTEIN: 7.2 g/dL (ref 6.5–8.1)

## 2017-09-11 LAB — LIPASE, BLOOD: LIPASE: 45 U/L (ref 11–51)

## 2017-09-11 LAB — POC OCCULT BLOOD, ED: FECAL OCCULT BLD: NEGATIVE

## 2017-09-11 MED ORDER — POLYETHYLENE GLYCOL 3350 17 G PO PACK: 17.0000 g | PACK | Freq: Every day | ORAL | 0 refills | Status: DC

## 2017-09-11 MED ORDER — CEPHALEXIN 500 MG PO CAPS: 500.0000 mg | ORAL_CAPSULE | Freq: Three times a day (TID) | ORAL | 0 refills | Status: DC

## 2017-09-11 NOTE — ED Triage Notes (Signed)
Pt reports being seen yesterday for same. Was having right side abd pain and constipation, last bm was Friday. Pt states that he felt better and was dc home. Pain returned this afternoon with nausea.

## 2017-09-11 NOTE — Discharge Instructions (Signed)
You have been evaluated for your pain.  You have blood in your urine, please follow up with urologist next week for further evaluation.  Take antibiotic as prescribe for potential infection.  Take miralax to help with your constipation.  Return if you have any concerns.

## 2017-09-11 NOTE — ED Provider Notes (Signed)
[place] DEPT Provider Note   CSN: [id number] Arrival date & time: 09/11/17  1617     History   Chief Complaint Chief Complaint  Patient presents with  . Abdominal Pain  . Constipation    HPI Kenneth [name] is a 83 y.o. male.  HPI   83 year old male with history of CAD, HTN, HLD, type 2 diabetes, diverticulitis presenting with complaints of right flank pain.patient was seen in the ER yesterday for abdominal pain that started earlier yesterday. Pain is right lower quadrant and radiates to his right flank. Pain was associated with nausea vomiting. He did not report any fever or chills, lightheadedness, dizziness, productive cough, dysuria, constipation or diarrhea initially.abs initially with evidence of hematuria. CT scan of abdomen pelvis without acute finding. It was initially thought that patient may have passed a kidney stone His symptoms improved with treatment and he was discharge home.patient endorse today after he ate lunch, he developed the same pain. Pain is located to his right lower quadrant. Sharp, intense, and now has improved to 2 out of 10. Pain is nonradiating anywhere. Denies any associated fever or chills, cough or shortness of breath. He did notice blood in his urine yesterday but is was told that there was blood in his urine. He also reports feeling constipated, havent't had a bowel movement in 3 days. He normally have a bowel movement daily. states he hasn't ate much. Admits to passing flatus.  Past Medical History:  Diagnosis Date  . Anemia    on iron  . Anginal pain ([place])    last 1-2 MONTHS ago goes away if stop and rest  . Anxiety   . Arthritis   . Basal cell carcinoma    left ear  . CAD (coronary artery disease)   . Chronic cough   . Chronic rhinitis   . Complication of anesthesia   . Coronary atherosclerosis of unspecified type of vessel, native or graft    CABG 1993, LIMA to LAD, SVG to obtuse marginal one, SVG to posterior descending, stable  angina  . Diabetes ([place])   . Difficulty sleeping   . Diverticulosis of colon   . Dizziness   . Dysrhythmia    occ "skips a beat"  . GERD (gastroesophageal reflux disease)   . Glaucoma   . H/O hiatal hernia   . Hernia    left side  . History of gout   . History of kidney stones   . HTN (hypertension)   . Hyperlipidemia `  . Melanoma of skin, site unspecified   . Numbness    TOES  . PONV (postoperative nausea and vomiting)   . [facility] spotted fever 2-3 yrs ago  . Thyroid disease     Patient Active Problem List   Diagnosis Date Noted  . Colostomy status ([place]) 03/16/2016  . CAD (coronary artery disease) 09/13/2015  . Elevated troponin 09/13/2015  . Hemoptysis   . Nausea and vomiting 09/09/2015  . Small bowel obstruction ([place]) 09/09/2015  . Hypertensive urgency 09/09/2015  . Type II diabetes mellitus with ophthalmic manifestations, glaucoma 09/09/2015  . Colonic mass   . Intestinal obstruction ([place])   . Preop cardiovascular exam 06/27/2014  . Left inguinal hernia 06/26/2014  . Dysphagia 09/28/2013    Class: Chronic  . Abnormal weight loss 09/28/2013    Class: Chronic  . HTN (hypertension)   . Coronary atherosclerosis of autologous vein bypass graft   . Hyperlipidemia   . GERD (gastroesophageal reflux disease)   .  Nephrolithiasis   . Melanoma of skin, site unspecified   . Glaucoma     Past Surgical History:  Procedure Laterality Date  . COLONOSCOPY WITH PROPOFOL N/A 11/27/2013   Procedure: COLONOSCOPY WITH PROPOFOL;  Surgeon: Garlan Fair, MD;  Location: [facility];  Service: Endoscopy;  Laterality: N/A;  . COLOSTOMY TAKEDOWN N/A 03/16/2016   Procedure: COLOSTOMY TAKEDOWN;  Surgeon: Excell Seltzer, MD;  Location: [facility];  Service: General;  Laterality: N/A;  . CORONARY ARTERY BYPASS GRAFT  1990's   x 3  . HERNIA REPAIR  1970's,17   lft ing hernia  . INGUINAL HERNIA REPAIR Left 06/28/2014   Procedure: Left  INGUINAL HERNIA REPAIR ;  Surgeon: Merrie Roof, MD;  Location: [facility];  Service: General;  Laterality: Left;  . INSERTION OF MESH Left 06/28/2014   Procedure: INSERTION OF MESH;  Surgeon: Merrie Roof, MD;  Location: [facility];  Service: General;  Laterality: Left;  . INSERTION OF MESH N/A 10/11/2014  . LAPAROSCOPY  10/11/2014  . LAPAROTOMY N/A 09/11/2015   Procedure: EXPLORATORY LAPAROTOMY;  Surgeon: Excell Seltzer, MD;  Location: [facility];  Service: General;  Laterality: N/A;  . MASS EXCISION Left 03/21/2017   Procedure: EXCISION BASAL CELL CARCINOMA LEFT EAR;  Surgeon: Leta Baptist, MD;  Location: [facility];  Service: ENT;  Laterality: Left;  . PARTIAL COLECTOMY N/A 09/11/2015   Procedure: SIGMOID COLECTOMY AND COLOSTOMY;  Surgeon: Excell Seltzer, MD;  Location: [facility];  Service: General;  Laterality: N/A;  . right leg fracture age 89    . SKIN FULL THICKNESS GRAFT Left 03/21/2017   Procedure: FULL THICKNESS SKIN GRAFT TO LEFT EAR;  Surgeon: Leta Baptist, MD;  Location: [facility];  Service: ENT;  Laterality: Left;  . TONSILLECTOMY  age 54 or 54  . VENTRAL HERNIA REPAIR  10/11/2014   Procedure: HERNIA REPAIR VENTRAL ADULT;  Surgeon: Autumn Messing III, MD;  Location: [facility];  Service: General;;       Home Medications    Prior to Admission medications   Medication Sig Start Date End Date Taking? Authorizing Provider  acetaminophen (TYLENOL) 325 MG tablet Take 325 mg by mouth every 6 (six) hours as needed for mild pain.     [provider]  amLODipine (NORVASC) 10 MG tablet Take 10 mg by mouth daily.    [provider]  Cyanocobalamin (VITAMIN B-12 PO) Take 1 tablet by mouth daily.    [provider]  ferrous sulfate 325 (65 FE) MG tablet Take 325 mg by mouth daily with breakfast.    [provider]  hydrochlorothiazide (HYDRODIURIL) 25 MG tablet Take 12.5 mg by mouth daily.  10/02/14   [provider]  ipratropium (ATROVENT) 0.03 % nasal spray Place 2 sprays into  both nostrils 2 (two) times daily as needed for rhinitis.    [provider]  isosorbide mononitrate (IMDUR) 60 MG 24 hr tablet Take 60 mg by mouth every evening.     [provider]  latanoprost (XALATAN) 0.005 % ophthalmic solution Place 1 drop into both eyes at bedtime.    [provider]  levothyroxine (SYNTHROID, LEVOTHROID) 50 MCG tablet Take 50 mcg by mouth daily before breakfast.    [provider]  metFORMIN (GLUCOPHAGE) 1000 MG tablet Take 500 mg by mouth daily with breakfast.     [provider]  metoprolol succinate (TOPROL-XL) 50 MG 24 hr tablet Take 25 mg by mouth daily.  Take with or immediately following a meal.    [provider]  metoprolol succinate (TOPROL-XL) 50 MG 24 hr tablet TAKE 1 TABLET DAILY Patient not taking: Reported on 09/10/2017 10/04/16   Belva Crome, MD  Multiple Vitamin (MULTIVITAMIN WITH MINERALS) TABS tablet Take 1 tablet by mouth daily.    [provider]  nitroGLYCERIN (NITROSTAT) 0.4 MG SL tablet Place 1 tablet (0.4 mg total) under the tongue every 5 (five) minutes as needed for chest pain. 10/09/15   Eileen Stanford, PA-C  oxyCODONE-acetaminophen (ROXICET) 5-325 MG tablet Take 1 tablet by mouth every 4 (four) hours as needed for severe pain. 03/21/17   Leta Baptist, MD  pantoprazole (PROTONIX) 40 MG tablet Take 40 mg by mouth daily.    [provider]  potassium chloride (K-DUR,KLOR-CON) 10 MEQ tablet Take 10 mEq by mouth daily.     [provider]  simvastatin (ZOCOR) 20 MG tablet Take 20 mg by mouth every evening.    [provider]    Family History Family History  Problem Relation Age of Onset  . CAD Father   . Hyperlipidemia Father   . Cancer Mother     Social History Social History  Substance Use Topics  . Smoking status: Light Tobacco Smoker    Types: Cigars  . Smokeless tobacco: Never Used  . Alcohol use Yes     Comment: RARE     Allergies     Aspirin; Lipitor [atorvastatin]; Lopressor [metoprolol tartrate]; and Penicillins   Review of Systems Review of Systems  All other systems reviewed and are negative.    Physical Exam Updated Vital Signs BP 137/68 (BP Location: Left Arm)   Pulse 70   Temp 97.9 F (36.6 C) (Oral)   Resp 16   Ht 5\' 10"  (1.778 m)   Wt 72.6 kg (160 lb)   SpO2 98%   BMI 22.96 kg/m   Physical Exam  Constitutional: He appears well-developed and well-nourished. No distress.  Elderly male laying in bed in no acute discomfort, nontoxic in appearance  HENT:  Head: Atraumatic.  Eyes: Conjunctivae are normal.  Neck: Neck supple.  Cardiovascular: Normal rate and regular rhythm.   Pulmonary/Chest: Effort normal and breath sounds normal.  Abdominal: Soft. Bowel sounds are normal. He exhibits no distension. There is tenderness (mild tenderness to right lower abdomen without guarding or rebound tenderness. Bowel sounds present).  Genitourinary:  Genitourinary Comments: Chaperone present during exam. Normal rectal tone, no obvious thrombosed hemorrhoid, no stool impaction, no rectal mass, normal color stool on glove.  Musculoskeletal: He exhibits tenderness (mild tenderness to lumbar paraspinal musculature bilaterally no CVA tenderness).  Neurological: He is alert.  Skin: No rash noted.  Psychiatric: He has a normal mood and affect.  Nursing note and vitals reviewed.    ED Treatments / Results  Labs (all labs ordered are listed, but only abnormal results are displayed) Labs Reviewed  COMPREHENSIVE METABOLIC PANEL - Abnormal; Notable for the following:       Result Value   Glucose, Bld 170 (*)    ALT 14 (*)    All other components within normal limits  CBC - Abnormal; Notable for the following:    WBC 11.8 (*)    RBC 3.96 (*)    Hemoglobin 12.4 (*)    HCT 38.4 (*)    All other components within normal limits  URINALYSIS, ROUTINE W REFLEX MICROSCOPIC - Abnormal; Notable for the following:     APPearance HAZY (*)  Hgb urine dipstick MODERATE (*)    Ketones, ur 5 (*)    Protein, ur 30 (*)    Leukocytes, UA TRACE (*)    All other components within normal limits  URINE CULTURE  LIPASE, BLOOD  POC OCCULT BLOOD, ED    EKG  EKG Interpretation None     ED ECG REPORT   Date: 09/11/2017  Rate: 62  Rhythm: normal sinus rhythm  QRS Axis: normal  Intervals: normal  ST/T Wave abnormalities: normal  Conduction Disutrbances:incomplete RBBB, LVH  Narrative Interpretation:   Old EKG Reviewed: unchanged  I have personally reviewed the EKG tracing and agree with the computerized printout as noted.   Radiology Ct Abdomen Pelvis W Contrast  Result Date: 09/10/2017 CLINICAL DATA:  Patient has some of the pain for the past month. He has had several abdominal surgeries in the past. EXAM: CT ABDOMEN AND PELVIS WITH CONTRAST TECHNIQUE: Multidetector CT imaging of the abdomen and pelvis was performed using the standard protocol following bolus administration of intravenous contrast. CONTRAST:  141mL ISOVUE-300 IOPAMIDOL (ISOVUE-300) INJECTION 61% COMPARISON:  09/09/2015 FINDINGS: Lower chest: No acute abnormality. Hepatobiliary: Diffuse low attenuation liver as can be seen with hepatic steatosis. 2.3 x 2.4 cm hypodense, fluid attenuating right hepatic mass most consistent with a cyst. More inferiorly adjacent to the gallbladder fossa there is a 2.3 x 1.5 cm hypodense, fluid attenuating right hepatic mass most consistent with a cyst. No other hepatic masses. Normal gallbladder. No intrahepatic or extrahepatic biliary ductal dilatation. Pancreas: Unremarkable. No pancreatic ductal dilatation or surrounding inflammatory changes. Spleen: Normal in size without focal abnormality. Adrenals/Urinary Tract: Adrenal glands are unremarkable. No renal mass. No hydronephrosis. Nonobstructing bilateral renal calculi. Bladder is unremarkable. Stomach/Bowel: Large hiatal hernia with the entirety of the stomach  within the thorax. Appendix appears normal. No evidence of bowel wall thickening, distention, or inflammatory changes. Diverticulosis without evidence of diverticulitis. Vascular/Lymphatic: Abdominal aortic atherosclerosis. Normal caliber abdominal aorta. Reproductive: Prostate is unremarkable. Other: No abdominal wall hernia or abnormality. No abdominopelvic ascites. Musculoskeletal: No acute osseous abnormality. No aggressive lytic or sclerotic osseous lesion. Degenerative disc disease with disc height loss at L4-5 and L5-S1. Bilateral facet arthropathy at L4-5 and L5-S1. IMPRESSION: 1. No acute abdominal or pelvic pathology. 2. Large hiatal hernia with the entirety of the stomach within the thorax. 3. Hepatic steatosis. 4.  Aortic Atherosclerosis (ICD10-170.0) Electronically Signed   By: Kathreen Devoid   On: 09/10/2017 12:15   Dg Chest Port 1 View  Result Date: 09/10/2017 CLINICAL DATA:  83 year old male with nausea and vomiting EXAM: PORTABLE CHEST 1 VIEW COMPARISON:  Prior chest x-ray 12/27/2016 FINDINGS: Large hiatal hernia with partially intrathoracic stomach. This appear similar compared to prior. Otherwise, the cardiac and mediastinal contours are within normal limits. Patient is status post median sternotomy with evidence of prior multivessel CABG including LIMA bypass. Atherosclerotic calcifications are present in the transverse aorta. No evidence of pulmonary edema, pleural effusion or pneumothorax. No focal airspace consolidation to suggest pneumonia. No acute osseous abnormality. IMPRESSION: 1. No acute cardiopulmonary process. 2. Similar appearance of large hiatal hernia with partially intrathoracic stomach. 3.  Aortic Atherosclerosis (ICD10-170.0) Electronically Signed   By: Jacqulynn Cadet M.D.   On: 09/10/2017 09:51    Procedures Procedures (including critical care time)  Medications Ordered in ED Medications - No data to display   Initial Impression / Assessment and Plan / ED Course    I have reviewed the triage vital signs and the nursing notes.  Pertinent labs & imaging results that were available during my care of the patient were reviewed by me and considered in my medical decision making (see chart for details).     BP (!) 169/90   Pulse 65   Temp 97.9 F (36.6 C) (Oral)   Resp 18   Ht 5\' 10"  (1.778 m)   Wt 72.6 kg (160 lb)   SpO2 97%   BMI 22.96 kg/m    Final Clinical Impressions(s) / ED Diagnoses   Final diagnoses:  Hematuria, unspecified type  Other constipation    New Prescriptions New Prescriptions   CEPHALEXIN (KEFLEX) 500 MG CAPSULE    Take 1 capsule (500 mg total) by mouth 3 (three) times daily.   POLYETHYLENE GLYCOL (MIRALAX / GLYCOLAX) PACKET    Take 17 g by mouth daily.   6:37 PM Patient with recurrent right lower quadrant abdominal pain and nausea. Having had a bowel movement in the past several days.CT scan obtained yesterday without acute concerning feature. Evidence of hiatal hernia, that is not new. There is a nonobstructive stone and mild hematuria on urine. Labs today are reassuring. He has minimal discomfort on palpation of his abdomen.on rectal exam, he has normal rectal tone, small amount of stool in rectal vault with normal color on glove, no obvious mass and no obvious evidence of stool impaction. Care discussed with DR. Ray.  8:02 PM Urine with evidence of blood and urine. Urine culture sent however patient will be treated for suspect unit tract infection with Keflex. She will also need to follow-up with urologist to ensure resolutions of his hematuria. Fecal occult test is negative, normal lipase, labs a mostly reassuring, and similar to yesterday.     Domenic Moras, PA-C 09/11/17 2007    Pattricia Boss, MD 09/11/17 2041

## 2017-09-12 ENCOUNTER — Encounter: Payer: Self-pay | Admitting: Interventional Cardiology

## 2017-09-13 LAB — URINE CULTURE: Culture: NO GROWTH

## 2017-09-23 ENCOUNTER — Encounter: Payer: Self-pay | Admitting: Interventional Cardiology

## 2017-09-23 ENCOUNTER — Ambulatory Visit (INDEPENDENT_AMBULATORY_CARE_PROVIDER_SITE_OTHER): Payer: Medicare Other | Admitting: Interventional Cardiology

## 2017-09-23 VITALS — BP 146/72 | HR 68 | Ht 70.0 in | Wt 164.8 lb

## 2017-09-23 DIAGNOSIS — E785 Hyperlipidemia, unspecified: Secondary | ICD-10-CM | POA: Diagnosis not present

## 2017-09-23 DIAGNOSIS — I10 Essential (primary) hypertension: Secondary | ICD-10-CM | POA: Diagnosis not present

## 2017-09-23 DIAGNOSIS — I25709 Atherosclerosis of coronary artery bypass graft(s), unspecified, with unspecified angina pectoris: Secondary | ICD-10-CM | POA: Diagnosis not present

## 2017-09-23 DIAGNOSIS — E118 Type 2 diabetes mellitus with unspecified complications: Secondary | ICD-10-CM

## 2017-09-23 DIAGNOSIS — R0602 Shortness of breath: Secondary | ICD-10-CM

## 2017-09-23 MED ORDER — SPIRONOLACTONE 25 MG PO TABS: 25.0000 mg | ORAL_TABLET | Freq: Every day | ORAL | 3 refills | Status: DC

## 2017-09-23 NOTE — Progress Notes (Signed)
Cardiology Office Note    Date:  09/23/2017   ID:  Kenneth [name], DOB [birth year], MRN [id number]  PCP:  Kenneth Orn, MD  Cardiologist: Kenneth Grooms, MD    Chief Complaint  Patient presents with  . Coronary Artery Disease  . Shortness of Breath    History of Present Illness:  Kenneth [name] is a 83 y.o. male coronary artery disease with prior bypass surgery, hypertension, hyperlipidemia, hypertension, and history of GI bleeding.  Kenneth [name] has shortness of breath with exertion. He occasionally has shortness of breath when he is recumbent. When this occurs he will get up and sleep in a recliner. He is not noticing lower extremity edema. He is having increasing difficulty with ambulation because of dyspnea. He occasionally has chest tightness.  Recently during 2 emergency room visits for kidney stones, when he was in severe flank pain or cause of the kidney stones he also had chest discomfort that required nitroglycerin. With ordinary activity he does not have this type chest discomfort.   Past Medical History:  Diagnosis Date  . Anemia    on iron  . Anginal pain ([place])    last 1-2 MONTHS ago goes away if stop and rest  . Anxiety   . Arthritis   . Basal cell carcinoma    left ear  . CAD (coronary artery disease)   . Chronic cough   . Chronic rhinitis   . Complication of anesthesia   . Coronary atherosclerosis of unspecified type of vessel, native or graft    CABG 1993, LIMA to LAD, SVG to obtuse marginal one, SVG to posterior descending, stable angina  . Diabetes ([place])   . Difficulty sleeping   . Diverticulosis of colon   . Dizziness   . Dysrhythmia    occ "skips a beat"  . GERD (gastroesophageal reflux disease)   . Glaucoma   . H/O hiatal hernia   . Hernia    left side  . History of gout   . History of kidney stones   . HTN (hypertension)   . Hyperlipidemia `  . Melanoma of skin, site unspecified   . Numbness    TOES  . PONV (postoperative nausea  and vomiting)   . [facility] spotted fever 2-3 yrs ago  . Thyroid disease     Past Surgical History:  Procedure Laterality Date  . COLONOSCOPY WITH PROPOFOL N/A 11/27/2013   Procedure: COLONOSCOPY WITH PROPOFOL;  Surgeon: Garlan Fair, MD;  Location: [facility];  Service: Endoscopy;  Laterality: N/A;  . COLOSTOMY TAKEDOWN N/A 03/16/2016   Procedure: COLOSTOMY TAKEDOWN;  Surgeon: Excell Seltzer, MD;  Location: [facility];  Service: General;  Laterality: N/A;  . CORONARY ARTERY BYPASS GRAFT  1990's   x 3  . HERNIA REPAIR  1970's,15   lft ing hernia  . INGUINAL HERNIA REPAIR Left 06/28/2014   Procedure: Left  INGUINAL HERNIA REPAIR ;  Surgeon: Merrie Roof, MD;  Location: [facility];  Service: General;  Laterality: Left;  . INSERTION OF MESH Left 06/28/2014   Procedure: INSERTION OF MESH;  Surgeon: Merrie Roof, MD;  Location: [facility];  Service: General;  Laterality: Left;  . INSERTION OF MESH N/A 10/11/2014  . LAPAROSCOPY  10/11/2014  . LAPAROTOMY N/A 09/11/2015   Procedure: EXPLORATORY LAPAROTOMY;  Surgeon: Excell Seltzer, MD;  Location: [facility];  Service: General;  Laterality: N/A;  . MASS EXCISION Left 03/21/2017   Procedure: EXCISION BASAL CELL CARCINOMA  LEFT EAR;  Surgeon: Leta Baptist, MD;  Location: [place];  Service: ENT;  Laterality: Left;  . PARTIAL COLECTOMY N/A 09/11/2015   Procedure: SIGMOID COLECTOMY AND COLOSTOMY;  Surgeon: Excell Seltzer, MD;  Location: [facility];  Service: General;  Laterality: N/A;  . right leg fracture age 83    . SKIN FULL THICKNESS GRAFT Left 03/21/2017   Procedure: FULL THICKNESS SKIN GRAFT TO LEFT EAR;  Surgeon: Leta Baptist, MD;  Location: [facility];  Service: ENT;  Laterality: Left;  . TONSILLECTOMY  age 69 or 51  . VENTRAL HERNIA REPAIR  10/11/2014   Procedure: HERNIA REPAIR VENTRAL ADULT;  Surgeon: Autumn Messing III, MD;  Location: [facility];  Service: General;;    Current Medications: Outpatient Medications Prior to Visit   Medication Sig Dispense Refill  . acetaminophen (TYLENOL) 325 MG tablet Take 325 mg by mouth every 6 (six) hours as needed for mild pain.     [name] amLODipine (NORVASC) 10 MG tablet Take 10 mg by mouth daily.    . Cyanocobalamin (VITAMIN B-12 PO) Take 1 tablet by mouth daily.    . ferrous sulfate 325 (65 FE) MG tablet Take 325 mg by mouth daily with breakfast.    . hydrochlorothiazide (HYDRODIURIL) 25 MG tablet Take 12.5 mg by mouth daily.   0  . ipratropium (ATROVENT) 0.03 % nasal spray Place 2 sprays into both nostrils 2 (two) times daily as needed for rhinitis.    [name] isosorbide mononitrate (IMDUR) 60 MG 24 hr tablet Take 60 mg by mouth every evening.     . latanoprost (XALATAN) 0.005 % ophthalmic solution Place 1 drop into both eyes at bedtime.    [name] levothyroxine (SYNTHROID, LEVOTHROID) 50 MCG tablet Take 50 mcg by mouth daily before breakfast.    . metFORMIN (GLUCOPHAGE) 1000 MG tablet Take 500 mg by mouth daily with breakfast.     . metoprolol succinate (TOPROL-XL) 50 MG 24 hr tablet TAKE 1 TABLET DAILY (Patient taking differently: TAKE 25MG  BY MOUTH DAILY) 90 tablet 3  . Multiple Vitamin (MULTIVITAMIN WITH MINERALS) TABS tablet Take 1 tablet by mouth daily.    . nitroGLYCERIN (NITROSTAT) 0.4 MG SL tablet Place 1 tablet (0.4 mg total) under the tongue every 5 (five) minutes as needed for chest pain. 25 tablet 1  . oxyCODONE-acetaminophen (ROXICET) 5-325 MG tablet Take 1 tablet by mouth every 4 (four) hours as needed for severe pain. 20 tablet 0  . pantoprazole (PROTONIX) 40 MG tablet Take 40 mg by mouth daily.    . polyethylene glycol (MIRALAX / GLYCOLAX) packet Take 17 g by mouth daily. 14 each 0  . potassium chloride (K-DUR,KLOR-CON) 10 MEQ tablet Take 10 mEq by mouth daily.     . simvastatin (ZOCOR) 20 MG tablet Take 20 mg by mouth every evening.    . cephALEXin (KEFLEX) 500 MG capsule Take 1 capsule (500 mg total) by mouth 3 (three) times daily. (Patient not taking: Reported on 09/23/2017)  21 capsule 0   No facility-administered medications prior to visit.      Allergies:   Aspirin; Lipitor [atorvastatin]; Lopressor [metoprolol tartrate]; and Penicillins   Social History   Social History  . Marital status: Married    Spouse name: N/A  . Number of children: N/A  . Years of education: N/A   Social History Main Topics  . Smoking status: Light Tobacco Smoker    Types: Cigars  . Smokeless tobacco: Never Used  . Alcohol use  Yes     Comment: RARE  . Drug use: No     Comment: occ cigars  . Sexual activity: Not Asked   Other Topics Concern  . None   Social History Narrative  . None     Family History:  The patient's family history includes CAD in his father; Cancer in his mother; Hyperlipidemia in his father.   ROS:   Please see the history of present illness.    Decreased appetite with weight loss, occasional chest pain as described above, shortness of breath at night. He denies palpitations associated with this. Hearing loss, decreased memory, cough, depression, back pain, rash, dizziness, easy bruising, difficulty with balance, increased anxiety, and fatigue.  All other systems reviewed and are negative.   PHYSICAL EXAM:   VS:  BP (!) 146/72 (BP Location: Left Arm)   Pulse 68   Ht 5\' 10"  (1.778 m)   Wt 164 lb 12.8 oz (74.8 kg)   BMI 23.65 kg/m    GEN: Well nourished, well developed, in no acute distress  HEENT: normal  Neck: no JVD, carotid bruits, or masses Cardiac: RRR; no murmurs, rubs, or gallops,no edema  Respiratory:  clear to auscultation bilaterally, normal work of breathing GI: soft, nontender, nondistended, + BS MS: no deformity or atrophy  Skin: warm and dry, no rash Neuro:  Alert and Oriented x 3, Strength and sensation are intact Psych: euthymic mood, full affect  Wt Readings from Last 3 Encounters:  09/23/17 164 lb 12.8 oz (74.8 kg)  09/11/17 160 lb (72.6 kg)  03/21/17 167 lb 9.6 oz (76 kg)     Studies/Labs Reviewed:   EKG:   EKG  From 09/12/2017 reveals sinus rhythm, incomplete right bundle, left axis deviation, no acute changes.  Recent Labs: 09/11/2017: ALT 14; BUN 19; Creatinine, Ser 0.93; Hemoglobin 12.4; Platelets 338; Potassium 3.8; Sodium 137   Lipid Panel No results found for: CHOL, TRIG, HDL, CHOLHDL, VLDL, LDLCALC, LDLDIRECT  Additional studies/ records that were reviewed today include:  No new data. Recent chest x-ray did not reveal failure.    ASSESSMENT:    1. Coronary artery disease involving coronary bypass graft of native heart with angina pectoris ([place])   2. Essential hypertension   3. Shortness of breath   4. Hyperlipidemia with target LDL less than 70   5. Type 2 diabetes mellitus with complication, without long-term current use of insulin (HCC)      PLAN:  In order of problems listed above:  1. Angina is present and responsive to nitroglycerin. Shortness of breath may be an ischemic equivalent. Continue to use nitroglycerin for both chest discomfort and dyspnea that occurs at rest. 2. Blood pressure is adequate leak controlled in the 145 mm mercury systolic range. 3. May be an anginal equivalent. Suspect diastolic heart failure. Will add Aldactone. Gentle diuresis may improve dyspnea. Basic metabolic panel and BNP in one week. If dyspnea is not improved by additional diuresis, consider maxing the Imdur dose on neck visit. He will be seen by an extender in 2-3 weeks. 4. LDL target less than 70 should be adhered to.  Clinical follow-up with me in one year if symptoms improve with the above measures and follow-up.  Medication Adjustments/Labs and Tests Ordered: Current medicines are reviewed at length with the patient today.  Concerns regarding medicines are outlined above.  Medication changes, Labs and Tests ordered today are listed in the Patient Instructions below. Patient Instructions  Medication Instructions:  Your physician has recommended  you make the following change in your  medication: START: Spironolactone 25 mg daily    Labwork: Your physician recommends that you return for lab work in: 1 week for BMET, CBC, Pro BNP   Testing/Procedures: None ordered   Follow-Up: Your physician recommends that you schedule a follow-up appointment in: 2 weeks with his NP or PA.   Your physician recommends that you schedule a follow-up appointment in: 2-3 months with Dr. Tamala Julian     Any Other Special Instructions Will Be Listed Below (If Applicable).     If you need a refill on your cardiac medications before your next appointment, please call your pharmacy.     Signed, Kenneth Grooms, MD  09/23/2017 11:34 AM    [facility] [place], [place], [place]  [address] Phone: [phone]; Fax: [phone]

## 2017-09-23 NOTE — Patient Instructions (Signed)
Medication Instructions:  Your physician has recommended you make the following change in your medication: START: Spironolactone 25 mg daily    Labwork: Your physician recommends that you return for lab work in: 1 week for BMET, CBC, Pro BNP   Testing/Procedures: None ordered   Follow-Up: Your physician recommends that you schedule a follow-up appointment in: 2 weeks with his NP or PA.   Your physician recommends that you schedule a follow-up appointment in: 2-3 months with Dr. Tamala Julian     Any Other Special Instructions Will Be Listed Below (If Applicable).     If you need a refill on your cardiac medications before your next appointment, please call your pharmacy.

## 2017-09-29 ENCOUNTER — Other Ambulatory Visit: Payer: Self-pay | Admitting: Interventional Cardiology

## 2017-09-30 ENCOUNTER — Other Ambulatory Visit: Payer: Medicare Other | Admitting: *Deleted

## 2017-09-30 ENCOUNTER — Telehealth: Payer: Self-pay

## 2017-09-30 DIAGNOSIS — R0602 Shortness of breath: Secondary | ICD-10-CM

## 2017-09-30 LAB — BASIC METABOLIC PANEL
BUN/Creatinine Ratio: 18 (ref 10–24)
BUN: 15 mg/dL (ref 8–27)
CO2: 23 mmol/L (ref 20–29)
Calcium: 9.9 mg/dL (ref 8.6–10.2)
Chloride: 97 mmol/L (ref 96–106)
Creatinine, Ser: 0.84 mg/dL (ref 0.76–1.27)
GFR, EST AFRICAN AMERICAN: 91 mL/min/{1.73_m2} (ref >59)
GFR, EST NON AFRICAN AMERICAN: 79 mL/min/{1.73_m2} (ref >59)
Glucose: 187 mg/dL — ABNORMAL HIGH (ref 65–99)
POTASSIUM: 4 mmol/L (ref 3.5–5.2)
SODIUM: 138 mmol/L (ref 134–144)

## 2017-09-30 LAB — CBC
HEMATOCRIT: 39.1 % (ref 37.5–51.0)
Hemoglobin: 13 g/dL (ref 13.0–17.7)
MCH: 31.5 pg (ref 26.6–33.0)
MCHC: 33.2 g/dL (ref 31.5–35.7)
MCV: 95 fL (ref 79–97)
Platelets: 386 10*3/uL — ABNORMAL HIGH (ref 150–379)
RBC: 4.13 x10E6/uL — ABNORMAL LOW (ref 4.14–5.80)
RDW: 13.4 % (ref 12.3–15.4)
WBC: 8.1 10*3/uL (ref 3.4–10.8)

## 2017-09-30 LAB — PRO B NATRIURETIC PEPTIDE: NT-Pro BNP: 280 pg/mL (ref 0–486)

## 2017-09-30 NOTE — Telephone Encounter (Signed)
Pt presented this AM for labs.  Complained of dizziness and nausea, stated he thinks it is related to medication he is taking (spironalactone).  Explained this has occurred every day over the past 3 to 4 days.  Pt drove himself to clinic today and there is "no one" available to pick him up.  Requested to sit in the office for 20 minutes and await subsiding of symptoms. Pt states "I am going to drive myself.  The only thing you can do is call the police and if you do I will cancel all my appointments for your office.  Both Winifred Olive LPN and myself attempted to explain our concerns regarding the patient's safety as well as the safety of others.  Pt given a snack and drink after which he stated he was feeling like himself and was ready to leave. Pt instructed to stop spironalactone per Dr Tamala Julian.  Writer contacted patient to determine safe arrival home.  Pt confirmed he was 1 mile from his home he stopped at the post office.  Georgana Curio MHA RN CCM

## 2017-10-14 ENCOUNTER — Encounter: Payer: Self-pay | Admitting: Physician Assistant

## 2017-10-14 ENCOUNTER — Ambulatory Visit (INDEPENDENT_AMBULATORY_CARE_PROVIDER_SITE_OTHER): Payer: Medicare Other | Admitting: Physician Assistant

## 2017-10-14 VITALS — BP 158/82 | HR 75 | Ht 70.5 in | Wt 164.5 lb

## 2017-10-14 DIAGNOSIS — E785 Hyperlipidemia, unspecified: Secondary | ICD-10-CM

## 2017-10-14 DIAGNOSIS — I2510 Atherosclerotic heart disease of native coronary artery without angina pectoris: Secondary | ICD-10-CM

## 2017-10-14 DIAGNOSIS — I10 Essential (primary) hypertension: Secondary | ICD-10-CM | POA: Diagnosis not present

## 2017-10-14 DIAGNOSIS — I209 Angina pectoris, unspecified: Secondary | ICD-10-CM | POA: Diagnosis not present

## 2017-10-14 MED ORDER — HYDROCHLOROTHIAZIDE 25 MG PO TABS: 25.0000 mg | ORAL_TABLET | Freq: Every day | ORAL | 3 refills | Status: DC

## 2017-10-14 MED ORDER — METOPROLOL SUCCINATE ER 50 MG PO TB24: ORAL_TABLET | ORAL | 3 refills | Status: DC

## 2017-10-14 MED ORDER — METOPROLOL SUCCINATE ER 50 MG PO TB24: 25.0000 mg | ORAL_TABLET | Freq: Every day | ORAL | 3 refills | Status: DC

## 2017-10-14 NOTE — Progress Notes (Signed)
Cardiology Office Note    Date:  10/14/2017  ID:  BREN STEERS, DOB Feb 17, 1928, MRN [id number] PCP:  Lavone Orn, MD  Cardiologist:  Dr. Tamala Julian   Chief Complaint: f/u chest pain  History of Present Illness:  Kenneth [name] is a 83 y.o. male with history of CAD s/p CABG 1993 with chronic angina, HTN, HLD, DM, GERD, hiatal hernia, RMSF, thyroid disease who presents for f/u of angina.  No prior echo on file. Last cath 2012 following abnormal stress test showed severe native vessel disease with patent SVG and LIMA grafts; nuc felt to possibly be abnormal due to the occlusion of the ostial LAD preventing antegrade flow and occlusion of the mid-LAD at the anastomoses with the internal mammary leaving the proximal and mid anterior wall depending upon  poorly formed collaterals from the right coronary territory and the  obtuse marginal territory.  The patient also has diffuse disease in  the LAD distal to the LIMA graft insertion, however, there was no evidence of ischemia in the distal and apical LAD territory. Medical therapy was recommended. In 09/2017 he was seen in the ED twice for kidney stones. While in severe flank pain he also had chest discomfort requiring NTG. At f/u visit 09/23/17 Dr. Tamala Julian recommended continued use of NTG for possible ischemic equivalent. Initially Dr. Tamala Julian recommended initiation of spironolactone for possible diastolic CHF. Labs 09/30/17 showed K 4, Cr 0.84, Hgb 13, Plt 386, BNP 280. When patient came in for these labs he reported feeling dizzy and nauseated since starting spironolactone. However, when BNP came back normal, he advised to stay off spironolactone. Last LFTs and troponin otherwise 09/2017 were normal. No lipids on file.  He returns for follow-up today. His chest pain has totally resolved. He feels much better off spironolactone. He has chronic dyspnea with longer distances. He's also caring for his wife whose memory has worsened. He recently began eating  sausage biscuits at [facility]. He hasn't been following his BP recently but states previously it was 140-150. He did notice significantly elevated BP while having the kidney stones. He told me he's been previously told his goal SBP would be 150 or less due to his age and prior history of intolerances to higher doses of medicines. He says a long time ago his metoprolol was cut back due to nausea but I cannot really find any documentation regarding this. The dose seems to vary between 2016 and 2018 notes. This was documented as 50mg  daily at last OV but the patient says he believes he's been cutting it in half more recently. There is mention of prior fatigue with Lopressor but he has not been on this version for several years.  Past Medical History:  Diagnosis Date  . Anemia    on iron  . Anginal pain ([place])    last 1-2 MONTHS ago goes away if stop and rest  . Anxiety   . Arthritis   . Basal cell carcinoma    left ear  . CAD (coronary artery disease)    a. CAD s/p CABG 1993.  [name] Chronic cough   . Chronic rhinitis   . Complication of anesthesia   . Coronary atherosclerosis of unspecified type of vessel, native or graft    CABG 1993, LIMA to LAD, SVG to obtuse marginal one, SVG to posterior descending, stable angina  . Diabetes ([place])   . Difficulty sleeping   . Diverticulosis of colon   . Dizziness   . Dysrhythmia  occ "skips a beat"  . GERD (gastroesophageal reflux disease)   . Glaucoma   . H/O hiatal hernia   . Hernia    left side  . History of gout   . History of kidney stones   . HTN (hypertension)   . Hyperlipidemia `  . Melanoma of skin, site unspecified   . Numbness    TOES  . PONV (postoperative nausea and vomiting)   . [facility] spotted fever 2-3 yrs ago  . Thyroid disease     Past Surgical History:  Procedure Laterality Date  . CORONARY ARTERY BYPASS GRAFT  1990's   x 3  . HERNIA REPAIR  1970's,15   lft ing hernia  . right leg fracture age 71    .  TONSILLECTOMY  age 20 or 89    Current Medications: Current Meds  Medication Sig  . acetaminophen (TYLENOL) 325 MG tablet Take 325 mg by mouth every 6 (six) hours as needed for mild pain.   [name] amLODipine (NORVASC) 10 MG tablet Take 10 mg by mouth daily.  . Cyanocobalamin (VITAMIN B-12 PO) Take 1 tablet by mouth daily.  . ferrous sulfate 325 (65 FE) MG tablet Take 325 mg by mouth daily with breakfast.  . hydrochlorothiazide (HYDRODIURIL) 25 MG tablet Take 12.5 mg by mouth daily.   [name] ipratropium (ATROVENT) 0.03 % nasal spray Place 2 sprays into both nostrils 2 (two) times daily as needed for rhinitis.  [name] isosorbide mononitrate (IMDUR) 60 MG 24 hr tablet Take 60 mg by mouth every evening.   . latanoprost (XALATAN) 0.005 % ophthalmic solution Place 1 drop into both eyes at bedtime.  [name] levothyroxine (SYNTHROID, LEVOTHROID) 50 MCG tablet Take 50 mcg by mouth daily before breakfast.  . metFORMIN (GLUCOPHAGE) 1000 MG tablet Take 500 mg by mouth daily with breakfast.   . metoprolol succinate (TOPROL-XL) 50 MG 24 hr tablet TAKE 1 TABLET DAILY  . Multiple Vitamin (MULTIVITAMIN WITH MINERALS) TABS tablet Take 1 tablet by mouth daily.  . nitroGLYCERIN (NITROSTAT) 0.4 MG SL tablet Place 1 tablet (0.4 mg total) under the tongue every 5 (five) minutes as needed for chest pain.  [name] oxyCODONE-acetaminophen (ROXICET) 5-325 MG tablet Take 1 tablet by mouth every 4 (four) hours as needed for severe pain.  . pantoprazole (PROTONIX) 40 MG tablet Take 40 mg by mouth daily.  . polyethylene glycol (MIRALAX / GLYCOLAX) packet Take 17 g by mouth daily.  . potassium chloride (K-DUR,KLOR-CON) 10 MEQ tablet Take 10 mEq by mouth daily.   . simvastatin (ZOCOR) 20 MG tablet Take 20 mg by mouth every evening.  [name] spironolactone (ALDACTONE) 25 MG tablet Take 1 tablet (25 mg total) by mouth daily.     Allergies:   Aspirin; Lipitor [atorvastatin]; Lopressor [metoprolol tartrate]; and Penicillins   Social History    Socioeconomic History  . Marital status: Married    Spouse name: None  . Number of children: None  . Years of education: None  . Highest education level: None  Social Needs  . Financial resource strain: None  . Food insecurity - worry: None  . Food insecurity - inability: None  . Transportation needs - medical: None  . Transportation needs - non-medical: None  Occupational History  . None  Tobacco Use  . Smoking status: Light Tobacco Smoker    Types: Cigars  . Smokeless tobacco: Never Used  Substance and Sexual Activity  . Alcohol use: Yes    Comment: RARE  . Drug use: No  Comment: occ cigars  . Sexual activity: None  Other Topics Concern  . None  Social History Narrative  . None     Family History:  Family History  Problem Relation Age of Onset  . CAD Father   . Hyperlipidemia Father   . Cancer Mother      ROS:   Please see the history of present illness.  All other systems are reviewed and otherwise negative.    PHYSICAL EXAM:   VS:  BP (!) 158/82   Pulse 75   Ht 5' 10.5" (1.791 m)   Wt 164 lb 8 oz (74.6 kg)   SpO2 98%   BMI 23.27 kg/m   BMI: Body mass index is 23.27 kg/m. GEN: Well nourished, well developed WM, in no acute distress  HEENT: normocephalic, atraumatic Neck: no JVD, carotid bruits, or masses Cardiac: RRR; no murmurs, rubs, or gallops, no edema  Respiratory:  clear to auscultation bilaterally, normal work of breathing GI: soft, nontender, nondistended, + BS MS: no deformity or atrophy  Skin: warm and dry, no rash Neuro:  Alert and Oriented x 3, Strength and sensation are intact, follows commands Psych: euthymic mood, full affect  Wt Readings from Last 3 Encounters:  10/14/17 164 lb 8 oz (74.6 kg)  09/23/17 164 lb 12.8 oz (74.8 kg)  09/11/17 160 lb (72.6 kg)      Studies/Labs Reviewed:   EKG:   EKG was not ordered today  Recent Labs: 09/11/2017: ALT 14 09/30/2017: BUN 15; Creatinine, Ser 0.84; Hemoglobin 13.0; NT-Pro BNP  280; Platelets 386; Potassium 4.0; Sodium 138   Lipid Panel No results found for: CHOL, TRIG, HDL, CHOLHDL, VLDL, LDLCALC, LDLDIRECT  Additional studies/ records that were reviewed today include: Summarized above.    ASSESSMENT & PLAN:   1. Angina pectoris/CAD - he has not had any further episodes of chest pain. Suspect precipitated by pain from kidney stones. Volume status appears stable. He is not on ASA due to h/o bleeding. Continue Imdur. Given high blood pressure, will titrate metoprolol back to full dose (he suggested himself that he should try doing so). If CP recurs, consider increasing Imdur. Continue statin. 2. HTN - blood pressure elevated. Has been eating sausage biscuits recently. Discussed importance of sodium reduction. Plan to increase metoprolol back to prior dose of 50mg  daily. At some point he began taking 1/2 tablet daily on his own accord but it's not really clear there was any actual issue with the higher dose. He has f/u with his PCP in just a few weeks which would be good timing to reassess blood pressure. He also promises to follow it at home. I've asked him to resume checking his BP regularly at home  and calling if SBP is running 140 or greater. Ideally with CAD would like to see BP closer to 130/80 but it sounds like traditionally he has not tolerated higher doses of medications. Addendum: chart reviewed extensively after patient left the office to see if I could further clarify the issue of the metoprolol dose. Although not previously commented upon, ER EKG from 09/10/17 shows a HR of 46. This was while on metoprolol 25mg  daily. I have asked nurse to notify the patient to keep metoprolol at 25mg  daily but to increase HCTZ to 25mg  daily and follow BP with strict reduction in sodium. 3. Hyperlipidemia - lipids are followed by PCP. Has checkup next month. Asked patient to have them fax [place] copy of lipids for our review. Consideration could be given  to changing simvastatin to  atorvastatin per guidelines particularly if LDL is >70.  Disposition: F/u with Dr. Tamala Julian in 4 months.  Medication Adjustments/Labs and Tests Ordered: Current medicines are reviewed at length with the patient today.  Concerns regarding medicines are outlined above. Medication changes, Labs and Tests ordered today are summarized above and listed in the Patient Instructions accessible in Encounters.   Signed, Charlie Pitter, PA-C  10/14/2017 3:00 PM    [facility] [address] [place], [place], [place]  [address] Phone: [phone]; Fax: [phone]

## 2017-10-14 NOTE — Patient Instructions (Addendum)
Medication Instructions:  Your physician has recommended you make the following change in your medication:  1.  KEEP the Metoprolol the same taking 1/2 tablet 2.  INCREASE the Hydrochlorothiazide to 1 tablet   Take your Metoprolol as prescribed  Labwork: None ordered  Testing/Procedures: None ordered  Follow-Up: Your physician recommends that you schedule a follow-up appointment in: [place]  Any Other Special Instructions Will Be Listed Below (If Applicable).  Follow your blood pressure closely.  If you notice the top # is running higher than 140, call our office.   If you need a refill on your cardiac medications before your next appointment, please call your pharmacy.

## 2018-01-02 ENCOUNTER — Ambulatory Visit: Payer: Medicare Other | Admitting: Interventional Cardiology

## 2018-02-13 NOTE — Progress Notes (Signed)
Cardiology Office Note    Date:  02/14/2018   ID:  [name], DOB 05/29/1928, MRN [id number]  PCP:  Lavone Orn, MD  Cardiologist: Sinclair Grooms, MD   Chief Complaint  Patient presents with  . Coronary Artery Disease  . Shortness of Breath    History of Present Illness:  Kenneth [name] is a 84 y.o. male coronary artery disease with prior bypass surgery, hypertension, hyperlipidemia, hypertension, and history of GI bleeding.  Levert feels that things are getting worse.  States that he has shortness of breath with exertion.  No real chest discomfort.  He is unhappy with his level of activity.  He lives independently.  There is confusion about his medications.  It seemed as though Mrs. Kenneth [name].  [name]  increased the HCTZ to 25 mg/day however he is still taken 12.5 mg.    Past Medical History:  Diagnosis Date  . Anemia    on iron  . Anginal pain ([place])    last 1-2 MONTHS ago goes away if stop and rest  . Anxiety   . Arthritis   . Basal cell carcinoma    left ear  . CAD (coronary artery disease)    a. CAD s/p CABG 1993.  [name] Chronic cough   . Chronic rhinitis   . Complication of anesthesia   . Coronary atherosclerosis of unspecified type of vessel, native or graft    CABG 1993, LIMA to LAD, SVG to obtuse marginal one, SVG to posterior descending, stable angina  . Diabetes ([place])   . Difficulty sleeping   . Diverticulosis of colon   . Dizziness   . Dysrhythmia    occ "skips a beat"  . GERD (gastroesophageal reflux disease)   . Glaucoma   . H/O hiatal hernia   . Hernia    left side  . History of gout   . History of kidney stones   . HTN (hypertension)   . Hyperlipidemia `  . Melanoma of skin, site unspecified   . Numbness    TOES  . PONV (postoperative nausea and vomiting)   . [facility] spotted fever 2-3 yrs ago  . Thyroid disease     Past Surgical History:  Procedure Laterality Date  . COLONOSCOPY WITH PROPOFOL N/A 11/27/2013   Procedure:  COLONOSCOPY WITH PROPOFOL;  Surgeon: Garlan Fair, MD;  [facility];  Service: Endoscopy;  Laterality: N/A;  . COLOSTOMY TAKEDOWN N/A 03/16/2016   Procedure: COLOSTOMY TAKEDOWN;  Surgeon: Excell Seltzer, MD;  [facility];  Service: General;  Laterality: N/A;  . CORONARY ARTERY BYPASS GRAFT  1990's   x 3  . HERNIA REPAIR  1970's,15   lft ing hernia  . INGUINAL HERNIA REPAIR Left 06/28/2014   Procedure: Left  INGUINAL HERNIA REPAIR ;  Surgeon: Merrie Roof, MD;  [facility];  Service: General;  Laterality: Left;  . INSERTION OF MESH Left 06/28/2014   Procedure: INSERTION OF MESH;  Surgeon: Merrie Roof, MD;  [facility];  Service: General;  Laterality: Left;  . INSERTION OF MESH N/A 10/11/2014  . LAPAROSCOPY  10/11/2014  . LAPAROTOMY N/A 09/11/2015   Procedure: EXPLORATORY LAPAROTOMY;  Surgeon: Excell Seltzer, MD;  [facility];  Service: General;  Laterality: N/A;  . MASS EXCISION Left 03/21/2017   Procedure: EXCISION BASAL CELL CARCINOMA LEFT EAR;  Surgeon: Leta Baptist, MD;  Location: [facility];  Service: ENT;  Laterality: Left;  .  PARTIAL COLECTOMY N/A 09/11/2015   Procedure: SIGMOID COLECTOMY AND COLOSTOMY;  Surgeon: Excell Seltzer, MD;  [facility];  Service: General;  Laterality: N/A;  . right leg fracture age 48    . SKIN FULL THICKNESS GRAFT Left 03/21/2017   Procedure: FULL THICKNESS SKIN GRAFT TO LEFT EAR;  Surgeon: Leta Baptist, MD;  Location: [facility];  Service: ENT;  Laterality: Left;  . TONSILLECTOMY  age 4 or 24  . VENTRAL HERNIA REPAIR  10/11/2014   Procedure: HERNIA REPAIR VENTRAL ADULT;  Surgeon: Autumn Messing III, MD;  Location: [facility];  Service: General;;    Current Medications: Outpatient Medications Prior to Visit  Medication Sig Dispense Refill  . acetaminophen (TYLENOL) 325 MG tablet Take 325 mg by mouth every 6 (six) hours as needed for mild pain.     [name] amLODipine (NORVASC) 5 MG tablet Take 5 mg by  mouth daily.    . Cyanocobalamin (VITAMIN B-12 PO) Take 1 tablet by mouth daily.    . ferrous sulfate 325 (65 FE) MG tablet Take 325 mg by mouth daily with breakfast.    . ipratropium (ATROVENT) 0.03 % nasal spray Place 2 sprays into both nostrils 2 (two) times daily as needed for rhinitis.    [name] isosorbide mononitrate (IMDUR) 60 MG 24 hr tablet Take 60 mg by mouth every evening.     . latanoprost (XALATAN) 0.005 % ophthalmic solution Place 1 drop into both eyes at bedtime.    [name] levothyroxine (SYNTHROID, LEVOTHROID) 50 MCG tablet Take 50 mcg by mouth daily before breakfast.    . metFORMIN (GLUCOPHAGE) 1000 MG tablet Take 500 mg by mouth daily with breakfast.     . metoprolol succinate (TOPROL-XL) 50 MG 24 hr tablet Take 1/2 tablet by mouth daily 45 tablet 3  . Multiple Vitamin (MULTIVITAMIN WITH MINERALS) TABS tablet Take 1 tablet by mouth daily.    . nitroGLYCERIN (NITROSTAT) 0.4 MG SL tablet Place 1 tablet (0.4 mg total) under the tongue every 5 (five) minutes as needed for chest pain. 25 tablet 1  . oxyCODONE-acetaminophen (ROXICET) 5-325 MG tablet Take 1 tablet by mouth every 4 (four) hours as needed for severe pain. 20 tablet 0  . pantoprazole (PROTONIX) 40 MG tablet Take 40 mg by mouth daily.    . polyethylene glycol (MIRALAX / GLYCOLAX) packet Take 17 g by mouth daily. 14 each 0  . potassium chloride (K-DUR,KLOR-CON) 10 MEQ tablet Take 10 mEq by mouth daily.     . simvastatin (ZOCOR) 20 MG tablet Take 20 mg by mouth every evening.    [name] spironolactone (ALDACTONE) 25 MG tablet Take 25 mg by mouth daily.    . hydrochlorothiazide (HYDRODIURIL) 25 MG tablet Take 12.5 mg by mouth daily.    [name] amLODipine (NORVASC) 10 MG tablet Take 10 mg by mouth daily.    . hydrochlorothiazide (HYDRODIURIL) 25 MG tablet Take 1 tablet (25 mg total) daily by mouth. (Patient not taking: Reported on 02/14/2018) 90 tablet 3  . spironolactone (ALDACTONE) 25 MG tablet Take 1 tablet (25 mg total) by mouth daily. 90 tablet  3   No facility-administered medications prior to visit.      Allergies:   Aspirin; Lipitor [atorvastatin]; Lopressor [metoprolol tartrate]; and Penicillins   Social History   Socioeconomic History  . Marital status: Married    Spouse name: None  . Number of children: None  . Years of education: None  . Highest education level: None  Social Needs  .  Financial resource strain: None  . Food insecurity - worry: None  . Food insecurity - inability: None  . Transportation needs - medical: None  . Transportation needs - non-medical: None  Occupational History  . None  Tobacco Use  . Smoking status: Light Tobacco Smoker    Types: Cigars  . Smokeless tobacco: Never Used  Substance and Sexual Activity  . Alcohol use: Yes    Comment: RARE  . Drug use: No    Comment: occ cigars  . Sexual activity: None  Other Topics Concern  . None  Social History Narrative  . None     Family History:  The patient's family history includes CAD in his father; Cancer in his mother; Hyperlipidemia in his father.   ROS:   Please see the history of present illness.    Other than fatigue no specific complaint.  As mentioned above no orthopnea and no edema.  Has had dehydration in the past when diuretic therapy is too aggressive. All other systems reviewed and are negative.   PHYSICAL EXAM:   VS:  BP (!) 158/76   Pulse 74   Ht 5' 10.5" (1.791 m)   Wt 168 lb (76.2 kg)   BMI 23.76 kg/m    GEN: Well nourished, well developed, in no acute distress  HEENT: normal  Neck: no JVD, carotid bruits, or masses Cardiac: RRR; no murmurs, rubs, or gallops,no edema  Respiratory:  clear to auscultation bilaterally, normal work of breathing GI: soft, nontender, nondistended, + BS MS: no deformity or atrophy  Skin: warm and dry, no rash Neuro:  Alert and Oriented x 3, Strength and sensation are intact Psych: euthymic mood, full affect  Wt Readings from Last 3 Encounters:  02/14/18 168 lb (76.2 kg)    10/14/17 164 lb 8 oz (74.6 kg)  09/23/17 164 lb 12.8 oz (74.8 kg)      Studies/Labs Reviewed:   EKG:  EKG  Not done  Recent Labs: 09/11/2017: ALT 14 09/30/2017: BUN 15; Creatinine, Ser 0.84; Hemoglobin 13.0; NT-Pro BNP 280; Platelets 386; Potassium 4.0; Sodium 138   Lipid Panel No results found for: CHOL, TRIG, HDL, CHOLHDL, VLDL, LDLCALC, LDLDIRECT  Additional studies/ records that were reviewed today include:  No new data    ASSESSMENT:    1. Coronary artery disease involving coronary bypass graft of native heart with angina pectoris ([place])   2. Type 2 diabetes mellitus with complication, without long-term current use of insulin ([place])   3. Hyperlipidemia with target LDL less than 70   4. Colostomy status ([place])   5. Acute on chronic diastolic heart failure ([facility])      PLAN:  In order of problems listed above:  1. He feels his arteries may be clogging up.  He has occasional discomfort in his chest.  His major limitation is exertional dyspnea. 2. Switch from 12.5 mg of HCTZ to furosemide 20 mg/day.  Follow-up in 1 week with basic metabolic panel, BNP, and echocardiogram.  If necessary to better control blood pressure and improve dyspnea, furosemide 40 mg/day may be used.  We will continue s Spironolactone once per day.  He has had difficulty in the past with over-diuresis and dehydration. 3. Not addressed today. 4. Not addressed today. 5. Not addressed today. 6. Not addressed today.  He will be seen in approximately 1 week by nurse practitioner/APP team member to reassess volume status on furosemide and determine if higher doses needed.  At his age and bleeding tendencies we should  make every attempt to manage him with medical therapy rather than invasive approach due to decrease likelihood of being able to maintain dual antiplatelet therapy.  I would like to see him again in approximately 2 months.  Medication Adjustments/Labs and Tests Ordered: Current medicines are  reviewed at length with the patient today.  Concerns regarding medicines are outlined above.  Medication changes, Labs and Tests ordered today are listed in the Patient Instructions below. Patient Instructions  Medication Instructions:  1) DISCONTINUE Hydrochlorothiazide 2) START Furosemide 20mg  once daily  Labwork: Your physician recommends that you return for lab work in: 1 week at same time as APP appointment (BMET, BNP)   Testing/Procedures: Your physician has requested that you have an echocardiogram. Echocardiography is a painless test that uses sound waves to create images of your heart. It provides your doctor with information about the size and shape of your heart and how well your heart's chambers and valves are working. This procedure takes approximately one hour. There are no restrictions for this procedure.    Follow-Up: Your physician recommends that you schedule a follow-up appointment in: 1 week with an APP (ok to take 72 hour hold slot if necessary)   Any Other Special Instructions Will Be Listed Below (If Applicable).     If you need a refill on your cardiac medications before your next appointment, please call your pharmacy.      Signed, Sinclair Grooms, MD  02/14/2018 12:24 PM    [facility] [place], [place], [place]  [address] Phone: [phone]; Fax: [phone]

## 2018-02-14 ENCOUNTER — Encounter: Payer: Self-pay | Admitting: Interventional Cardiology

## 2018-02-14 ENCOUNTER — Ambulatory Visit (INDEPENDENT_AMBULATORY_CARE_PROVIDER_SITE_OTHER): Payer: Medicare Other | Admitting: Interventional Cardiology

## 2018-02-14 VITALS — BP 158/76 | HR 74 | Ht 70.5 in | Wt 168.0 lb

## 2018-02-14 DIAGNOSIS — I5033 Acute on chronic diastolic (congestive) heart failure: Secondary | ICD-10-CM | POA: Diagnosis not present

## 2018-02-14 DIAGNOSIS — Z933 Colostomy status: Secondary | ICD-10-CM

## 2018-02-14 DIAGNOSIS — E785 Hyperlipidemia, unspecified: Secondary | ICD-10-CM

## 2018-02-14 DIAGNOSIS — E118 Type 2 diabetes mellitus with unspecified complications: Secondary | ICD-10-CM | POA: Diagnosis not present

## 2018-02-14 DIAGNOSIS — I25709 Atherosclerosis of coronary artery bypass graft(s), unspecified, with unspecified angina pectoris: Secondary | ICD-10-CM

## 2018-02-14 MED ORDER — FUROSEMIDE 20 MG PO TABS: 20.0000 mg | ORAL_TABLET | Freq: Every day | ORAL | 3 refills | Status: DC

## 2018-02-14 NOTE — Patient Instructions (Signed)
Medication Instructions:  1) DISCONTINUE Hydrochlorothiazide 2) START Furosemide 20mg  once daily  Labwork: Your physician recommends that you return for lab work in: 1 week at same time as APP appointment (BMET, BNP)   Testing/Procedures: Your physician has requested that you have an echocardiogram. Echocardiography is a painless test that uses sound waves to create images of your heart. It provides your doctor with information about the size and shape of your heart and how well your heart's chambers and valves are working. This procedure takes approximately one hour. There are no restrictions for this procedure.    Follow-Up: Your physician recommends that you schedule a follow-up appointment in: 1 week with an APP (ok to take 72 hour hold slot if necessary)   Any Other Special Instructions Will Be Listed Below (If Applicable).     If you need a refill on your cardiac medications before your next appointment, please call your pharmacy.

## 2018-02-21 ENCOUNTER — Ambulatory Visit (INDEPENDENT_AMBULATORY_CARE_PROVIDER_SITE_OTHER): Payer: Medicare Other | Admitting: Nurse Practitioner

## 2018-02-21 ENCOUNTER — Other Ambulatory Visit: Payer: Self-pay

## 2018-02-21 ENCOUNTER — Ambulatory Visit (HOSPITAL_COMMUNITY): Payer: Medicare Other | Attending: Cardiology

## 2018-02-21 ENCOUNTER — Encounter: Payer: Self-pay | Admitting: Nurse Practitioner

## 2018-02-21 ENCOUNTER — Other Ambulatory Visit: Payer: Medicare Other

## 2018-02-21 VITALS — BP 120/80 | HR 67 | Ht 70.5 in | Wt 165.0 lb

## 2018-02-21 DIAGNOSIS — R0602 Shortness of breath: Secondary | ICD-10-CM

## 2018-02-21 DIAGNOSIS — I10 Essential (primary) hypertension: Secondary | ICD-10-CM

## 2018-02-21 DIAGNOSIS — I5033 Acute on chronic diastolic (congestive) heart failure: Secondary | ICD-10-CM

## 2018-02-21 DIAGNOSIS — I081 Rheumatic disorders of both mitral and tricuspid valves: Secondary | ICD-10-CM | POA: Insufficient documentation

## 2018-02-21 DIAGNOSIS — Z951 Presence of aortocoronary bypass graft: Secondary | ICD-10-CM | POA: Insufficient documentation

## 2018-02-21 DIAGNOSIS — I110 Hypertensive heart disease with heart failure: Secondary | ICD-10-CM | POA: Insufficient documentation

## 2018-02-21 DIAGNOSIS — I2510 Atherosclerotic heart disease of native coronary artery without angina pectoris: Secondary | ICD-10-CM | POA: Insufficient documentation

## 2018-02-21 DIAGNOSIS — Z8249 Family history of ischemic heart disease and other diseases of the circulatory system: Secondary | ICD-10-CM | POA: Diagnosis not present

## 2018-02-21 DIAGNOSIS — E785 Hyperlipidemia, unspecified: Secondary | ICD-10-CM | POA: Diagnosis not present

## 2018-02-21 DIAGNOSIS — Z87891 Personal history of nicotine dependence: Secondary | ICD-10-CM | POA: Diagnosis not present

## 2018-02-21 DIAGNOSIS — E119 Type 2 diabetes mellitus without complications: Secondary | ICD-10-CM | POA: Diagnosis not present

## 2018-02-21 LAB — ECHOCARDIOGRAM COMPLETE
Height: 70.5 in
WEIGHTICAEL: 2640 [oz_av]

## 2018-02-21 MED ORDER — FUROSEMIDE 20 MG PO TABS: 10.0000 mg | ORAL_TABLET | Freq: Every day | ORAL | 3 refills | Status: DC

## 2018-02-21 NOTE — Patient Instructions (Addendum)
We will be checking the following labs today - BMET, BNP and CBC   Medication Instructions:    Continue with your current medicines. BUT  I am cutting the Lasix back to just 1/2 a tablet each day - hopefully you will have less stomach upset on this.     Testing/Procedures To Be Arranged:  N/A  Follow-Up:   See me in about 2 to 3 weeks  See Dr. Tamala Julian in about 2 months.     Other Special Instructions:   N/A    If you need a refill on your cardiac medications before your next appointment, please call your pharmacy.   Call the [place] office at [phone] if you have any questions, problems or concerns.

## 2018-02-21 NOTE — Progress Notes (Signed)
CARDIOLOGY OFFICE NOTE  Date:  02/21/2018    [name] Date of Birth: 04/10/1928 Medical Record #[id number]  PCP:  Lavone Orn, MD  Cardiologist:  Tamala Julian   Chief Complaint  Patient presents with  . Shortness of Breath  . Coronary Artery Disease  . Congestive Heart Failure    1 week check - seen for Dr. Tamala Julian    History of Present Illness: Kenneth [name] is a 84 y.o. male who presents today for a one week check. Seen for Dr. Tamala Julian.   He has a history of CAD with prior remote bypass surgery, hypertension, hyperlipidemia, hypertension, and prior history of GI bleeding.  Seen here earlier this month by Dr. Tamala Julian - was not doing well. Worsening dyspnea. No real chest pain but maybe having some discomfort. His medicines had gotten mixed up from a prior visit. Dr.Smith stopped HCTZ and started Lasix. He is to have echocardiogram. Dr. Tamala Julian has hoped for conservative management given prior GI bleeding.   Comes in today. Here alone.  He is not sure if the Lasix has helped - it may have - he is just not sure. But he does not like how it makes him feel - feels sick to his stomach. He is hoping that he does not have to stay on this long term. Tells me that he will get short of breath with going up steps and doing too much - this seems worse "over the past several years" per his report. He wonders if it is "age related". No real chest pain. Not too much dizziness. He is going to have his echo later today.   Past Medical History:  Diagnosis Date  . Anemia    on iron  . Anginal pain ([place])    last 1-2 MONTHS ago goes away if stop and rest  . Anxiety   . Arthritis   . Basal cell carcinoma    left ear  . CAD (coronary artery disease)    a. CAD s/p CABG 1993.  [name] Chronic cough   . Chronic rhinitis   . Complication of anesthesia   . Coronary atherosclerosis of unspecified type of vessel, native or graft    CABG 1993, LIMA to LAD, SVG to obtuse marginal one, SVG to posterior  descending, stable angina  . Diabetes ([place])   . Difficulty sleeping   . Diverticulosis of colon   . Dizziness   . Dysrhythmia    occ "skips a beat"  . GERD (gastroesophageal reflux disease)   . Glaucoma   . H/O hiatal hernia   . Hernia    left side  . History of gout   . History of kidney stones   . HTN (hypertension)   . Hyperlipidemia `  . Melanoma of skin, site unspecified   . Numbness    TOES  . PONV (postoperative nausea and vomiting)   . [facility] spotted fever 2-3 yrs ago  . Thyroid disease     Past Surgical History:  Procedure Laterality Date  . COLONOSCOPY WITH PROPOFOL N/A 11/27/2013   Procedure: COLONOSCOPY WITH PROPOFOL;  Surgeon: Garlan Fair, MD;  [facility];  Service: Endoscopy;  Laterality: N/A;  . COLOSTOMY TAKEDOWN N/A 03/16/2016   Procedure: COLOSTOMY TAKEDOWN;  Surgeon: Excell Seltzer, MD;  [facility];  Service: General;  Laterality: N/A;  . CORONARY ARTERY BYPASS GRAFT  1990's   x 3  . HERNIA REPAIR  1970's,15   lft ing hernia  .  INGUINAL HERNIA REPAIR Left 06/28/2014   Procedure: Left  INGUINAL HERNIA REPAIR ;  Surgeon: Merrie Roof, MD;  [facility];  Service: General;  Laterality: Left;  . INSERTION OF MESH Left 06/28/2014   Procedure: INSERTION OF MESH;  Surgeon: Merrie Roof, MD;  [facility];  Service: General;  Laterality: Left;  . INSERTION OF MESH N/A 10/11/2014  . LAPAROSCOPY  10/11/2014  . LAPAROTOMY N/A 09/11/2015   Procedure: EXPLORATORY LAPAROTOMY;  Surgeon: Excell Seltzer, MD;  [facility];  Service: General;  Laterality: N/A;  . MASS EXCISION Left 03/21/2017   Procedure: EXCISION BASAL CELL CARCINOMA LEFT EAR;  Surgeon: Leta Baptist, MD;  Location: [facility];  Service: ENT;  Laterality: Left;  . PARTIAL COLECTOMY N/A 09/11/2015   Procedure: SIGMOID COLECTOMY AND COLOSTOMY;  Surgeon: Excell Seltzer, MD;  [facility];  Service: General;  Laterality: N/A;  . right leg  fracture age 7    . SKIN FULL THICKNESS GRAFT Left 03/21/2017   Procedure: FULL THICKNESS SKIN GRAFT TO LEFT EAR;  Surgeon: Leta Baptist, MD;  Location: [facility];  Service: ENT;  Laterality: Left;  . TONSILLECTOMY  age 25 or 60  . VENTRAL HERNIA REPAIR  10/11/2014   Procedure: HERNIA REPAIR VENTRAL ADULT;  Surgeon: Autumn Messing III, MD;  Location: [facility];  Service: General;;     Medications: Current Meds  Medication Sig  . acetaminophen (TYLENOL) 325 MG tablet Take 325 mg by mouth every 6 (six) hours as needed for mild pain.   [name] amLODipine (NORVASC) 5 MG tablet Take 5 mg by mouth daily.  . Cyanocobalamin (VITAMIN B-12 PO) Take 1 tablet by mouth daily.  . ferrous sulfate 325 (65 FE) MG tablet Take 325 mg by mouth daily with breakfast.  . furosemide (LASIX) 20 MG tablet Take 0.5 tablets (10 mg total) by mouth daily.  [name] ipratropium (ATROVENT) 0.03 % nasal spray Place 2 sprays into both nostrils 2 (two) times daily as needed for rhinitis.  [name] isosorbide mononitrate (IMDUR) 60 MG 24 hr tablet Take 60 mg by mouth every evening.   . latanoprost (XALATAN) 0.005 % ophthalmic solution Place 1 drop into both eyes at bedtime.  [name] levothyroxine (SYNTHROID, LEVOTHROID) 50 MCG tablet Take 50 mcg by mouth daily before breakfast.  . metFORMIN (GLUCOPHAGE) 1000 MG tablet Take 500 mg by mouth daily with breakfast.   . metoprolol succinate (TOPROL-XL) 50 MG 24 hr tablet Take 1/2 tablet by mouth daily  . Multiple Vitamin (MULTIVITAMIN WITH MINERALS) TABS tablet Take 1 tablet by mouth daily.  . nitroGLYCERIN (NITROSTAT) 0.4 MG SL tablet Place 1 tablet (0.4 mg total) under the tongue every 5 (five) minutes as needed for chest pain.  [name] oxyCODONE-acetaminophen (ROXICET) 5-325 MG tablet Take 1 tablet by mouth every 4 (four) hours as needed for severe pain.  . pantoprazole (PROTONIX) 40 MG tablet Take 40 mg by mouth daily.  . polyethylene glycol (MIRALAX / GLYCOLAX) packet Take 17 g by mouth daily.  . potassium  chloride (K-DUR,KLOR-CON) 10 MEQ tablet Take 10 mEq by mouth daily.   . simvastatin (ZOCOR) 20 MG tablet Take 20 mg by mouth every evening.  [name] spironolactone (ALDACTONE) 25 MG tablet Take 25 mg by mouth daily.  . [DISCONTINUED] furosemide (LASIX) 20 MG tablet Take 1 tablet (20 mg total) by mouth daily.     Allergies: Allergies  Allergen Reactions  . Aspirin Other (See Comments)    Bleeding.    [name]  Lipitor [Atorvastatin] Other (See Comments)    tired  . Lopressor [Metoprolol Tartrate] Other (See Comments)    Sleepy all the time  . Penicillins Hives and Rash    Has patient had a PCN reaction causing immediate rash, facial/tongue/throat swelling, SOB or lightheadedness with hypotension: No Has patient had a PCN reaction causing severe rash involving mucus membranes or skin necrosis: No Has patient had a PCN reaction that required hospitalization No Has patient had a PCN reaction occurring within the last 10 years: Yes If all of the above answers are "NO", then may proceed with Cephalosporin use.     Social History: The patient  reports that he has quit smoking. His smoking use included cigars. he has never used smokeless tobacco. He reports that he drinks alcohol. He reports that he does not use drugs.   Family History: The patient's family history includes CAD in his father; Cancer in his mother; Hyperlipidemia in his father.   Review of Systems: Please see the history of present illness.   Otherwise, the review of systems is positive for none.   All other systems are reviewed and negative.   Physical Exam: VS:  BP 120/80 (BP Location: Left Arm, Patient Position: Sitting, Cuff Size: Normal)   Pulse 67   Ht 5' 10.5" (1.791 m)   Wt 165 lb (74.8 kg)   SpO2 97% Comment: at rest  BMI 23.34 kg/m  .  BMI Body mass index is 23.34 kg/m.  Wt Readings from Last 3 Encounters:  02/21/18 165 lb (74.8 kg)  02/14/18 168 lb (76.2 kg)  10/14/17 164 lb 8 oz (74.6 kg)    General: Elderly  male. Looks chronically ill. Alert and in no acute distress.  Looks pale to me.  HEENT: Normal.  Neck: Supple, no JVD, carotid bruits, or masses noted.  Cardiac: Regular rate and rhythm.  No significant edema.  Respiratory:  Lungs are clear to auscultation bilaterally with normal work of breathing.  GI: Soft and nontender.  MS: No deformity or atrophy. Gait and ROM intact.  Skin: Warm and dry. Color is pale.  Neuro:  Strength and sensation are intact and no gross focal deficits noted.  Psych: Alert, appropriate and with normal affect.   LABORATORY DATA:  EKG:  EKG is not ordered today.  Lab Results  Component Value Date   WBC 8.1 09/30/2017   HGB 13.0 09/30/2017   HCT 39.1 09/30/2017   PLT 386 (H) 09/30/2017   GLUCOSE 187 (H) 09/30/2017   ALT 14 (L) 09/11/2017   AST 24 09/11/2017   NA 138 09/30/2017   K 4.0 09/30/2017   CL 97 09/30/2017   CREATININE 0.84 09/30/2017   BUN 15 09/30/2017   CO2 23 09/30/2017   INR 0.95 09/09/2015   HGBA1C 6.7 (H) 03/10/2016     BNP (last 3 results) No results for input(s): BNP in the last 8760 hours.  ProBNP (last 3 results) Recent Labs    09/30/17 1058  PROBNP 280     Other Studies Reviewed Today:   Assessment/Plan:  1. CAD - Dr. Tamala Julian has favored conservative management - and with his age and bleeding tendencies we should make every attempt to manage him with medical therapy rather than invasive approach due to decrease likelihood of being able to maintain dual antiplatelet therapy.  He does not seem to be having much in the way of chest pain that I can discern.   2. Worsening dyspnea - probably multifactorial. Needs lab today.  Echo later today as well.   3. Chronic diastolic HF - does not feel well on the 20 mg of Lasix. Will try just 10 mg a day. Lab today. Include CBC. Echo later today. Further disposition to follow.   4. Advanced age  Current medicines are reviewed with the patient today.  The patient does not have  concerns regarding medicines other than what has been noted above.  The following changes have been made:  See above.  Labs/ tests ordered today include:    Orders Placed This Encounter  Procedures  . Basic metabolic panel  . CBC  . Pro b natriuretic peptide (BNP)     Disposition:   FU with me in about 2 to 3 weeks. Will arrange his follow up with Dr. Tamala Julian as well.    Patient is agreeable to this plan and will call if any problems develop in the interim.   SignedTruitt Merle, NP  02/21/2018 12:19 PM  [facility] [address] [place], [place]  [address] Phone: [phone] Fax: [phone]

## 2018-02-22 LAB — BASIC METABOLIC PANEL
BUN/Creatinine Ratio: 20 (ref 10–24)
BUN: 16 mg/dL (ref 8–27)
CO2: 24 mmol/L (ref 20–29)
Calcium: 9.8 mg/dL (ref 8.6–10.2)
Chloride: 98 mmol/L (ref 96–106)
Creatinine, Ser: 0.8 mg/dL (ref 0.76–1.27)
GFR calc Af Amer: 92 mL/min/{1.73_m2} (ref >59)
GFR calc non Af Amer: 80 mL/min/{1.73_m2} (ref >59)
Glucose: 146 mg/dL — ABNORMAL HIGH (ref 65–99)
Potassium: 4.1 mmol/L (ref 3.5–5.2)
Sodium: 140 mmol/L (ref 134–144)

## 2018-02-22 LAB — PRO B NATRIURETIC PEPTIDE: NT-Pro BNP: 467 pg/mL (ref 0–486)

## 2018-02-22 LAB — CBC
Hematocrit: 40.3 % (ref 37.5–51.0)
Hemoglobin: 13.2 g/dL (ref 13.0–17.7)
MCH: 31.7 pg (ref 26.6–33.0)
MCHC: 32.8 g/dL (ref 31.5–35.7)
MCV: 97 fL (ref 79–97)
Platelets: 357 10*3/uL (ref 150–379)
RBC: 4.17 x10E6/uL (ref 4.14–5.80)
RDW: 14 % (ref 12.3–15.4)
WBC: 11.1 10*3/uL — ABNORMAL HIGH (ref 3.4–10.8)

## 2018-02-27 ENCOUNTER — Telehealth: Payer: Self-pay | Admitting: Interventional Cardiology

## 2018-02-27 NOTE — Telephone Encounter (Signed)
Reviewed the echo - normal pumping function.  Let's just stay off until seen back and see how he does.  Burtis Junes, RN, [facility] [address] [place], [place]  [phone] [phone]

## 2018-02-27 NOTE — Telephone Encounter (Signed)
Pt states he was still having issues with joint pain and weakness in his legs from the Furosemide.  Pt stopped it about 2-3 days ago and sx have almost resolved.  Pt has had HCTZ in the past and had the same issue.  Denies SOB or swelling since stopping Furosemide.  Has f/u appt on 4/8 with Truitt Merle, NP.  Advised I will send this message to Cecille Rubin since she just seen him last week and get her recommendations unless she prefers we get them from Dr. Tamala Julian.  Pt appreciative for call.   Pt states he has never tried Torsemide.

## 2018-02-27 NOTE — Telephone Encounter (Signed)
Kenneth [name] is calling because the medication Furosemide cause him not to be able to walk or move and he was in the hospital . Is asking that you give him a call about it . Thanks

## 2018-02-27 NOTE — Telephone Encounter (Signed)
Spoke with pt and made him aware of recommendations.  Pt verbalized understanding and was in agreement with this plan.  

## 2018-03-13 ENCOUNTER — Encounter: Payer: Self-pay | Admitting: Nurse Practitioner

## 2018-03-13 ENCOUNTER — Ambulatory Visit (INDEPENDENT_AMBULATORY_CARE_PROVIDER_SITE_OTHER): Payer: Medicare Other | Admitting: Nurse Practitioner

## 2018-03-13 VITALS — BP 160/80 | HR 93 | Ht 70.5 in | Wt 160.8 lb

## 2018-03-13 DIAGNOSIS — R0602 Shortness of breath: Secondary | ICD-10-CM

## 2018-03-13 DIAGNOSIS — I25709 Atherosclerosis of coronary artery bypass graft(s), unspecified, with unspecified angina pectoris: Secondary | ICD-10-CM

## 2018-03-13 DIAGNOSIS — I10 Essential (primary) hypertension: Secondary | ICD-10-CM | POA: Diagnosis not present

## 2018-03-13 NOTE — Patient Instructions (Addendum)
We will be checking the following labs today - None   Medication Instructions:    Continue with your current medicines.     Testing/Procedures To Be Arranged:  N/A  Follow-Up:   See Dr. Tamala Julian in June as planned    Other Special Instructions:   N/A    If you need a refill on your cardiac medications before your next appointment, please call your pharmacy.   Call the [place] office at [phone] if you have any questions, problems or concerns.

## 2018-03-13 NOTE — Progress Notes (Signed)
CARDIOLOGY OFFICE NOTE  Date:  03/13/2018    Kenneth [name] Date of Birth: 1928-01-24 Medical Record #[id number]  PCP:  Lavone Orn, MD  Cardiologist:  Jennings Books    Chief Complaint  Patient presents with  . Coronary Artery Disease  . Congestive Heart Failure  . Shortness of Breath    Follow up visit - seen for Dr. Tamala Julian    History of Present Illness: Kenneth [name] is a 84 y.o. male who presents today for a follow up visit. Seen for Dr. Tamala Julian.   He has a history of CAD with prior remote bypass surgery, hypertension, hyperlipidemia, hypertension, and prior history of GI bleeding.  Seen here in March by Dr. Tamala Julian - was not doing well. Worsening dyspnea. No real chest pain but maybe having some discomfort. His medicines had gotten mixed up from a prior visit. Dr.Smith stopped HCTZ and started Lasix. He had an echocardiogram. Dr. Tamala Julian has hoped for conservative management given history of prior GI bleeding.   I then saw him for a recheck - he was not tolerating the Lasix - we cut the dose in half. Echo did not look too bad.   Comes in today. Here with his wife today. Medicines are very unclear. He says he stopped all of his medicines. Then says he is taking some and not taking others. Then he says he is half dosing some. He tells me that he restarted the HCTZ - but at half dose. He is taking Metoprolol - but at half dose. Not taking lasix, potassium or spironolactone but has these in his bag. He basically manages all of his own medicines. Says the diuretics dried his eyes out too much and he almost ended up in the hospital. Says the medicines made his legs too sore and hard to walk. He notes that his breathing is actually better. He feels like he is doing well and really has no concern today. His BP at home is around 599 systolic.   Past Medical History:  Diagnosis Date  . Anemia    on iron  . Anginal pain ([place])    last 1-2 MONTHS ago goes away if stop and rest    . Anxiety   . Arthritis   . Basal cell carcinoma    left ear  . CAD (coronary artery disease)    a. CAD s/p CABG 1993.  [name] Chronic cough   . Chronic rhinitis   . Complication of anesthesia   . Coronary atherosclerosis of unspecified type of vessel, native or graft    CABG 1993, LIMA to LAD, SVG to obtuse marginal one, SVG to posterior descending, stable angina  . Diabetes ([place])   . Difficulty sleeping   . Diverticulosis of colon   . Dizziness   . Dysrhythmia    occ "skips a beat"  . GERD (gastroesophageal reflux disease)   . Glaucoma   . H/O hiatal hernia   . Hernia    left side  . History of gout   . History of kidney stones   . HTN (hypertension)   . Hyperlipidemia `  . Melanoma of skin, site unspecified   . Numbness    TOES  . PONV (postoperative nausea and vomiting)   . [facility] spotted fever 2-3 yrs ago  . Thyroid disease     Past Surgical History:  Procedure Laterality Date  . COLONOSCOPY WITH PROPOFOL N/A 11/27/2013   Procedure: COLONOSCOPY WITH PROPOFOL;  Surgeon: Hassell Done  Sandria Senter, MD;  Location: [facility];  Service: Endoscopy;  Laterality: N/A;  . COLOSTOMY TAKEDOWN N/A 03/16/2016   Procedure: COLOSTOMY TAKEDOWN;  Surgeon: Excell Seltzer, MD;  [facility]: [facility];  Service: General;  Laterality: N/A;  . CORONARY ARTERY BYPASS GRAFT  1990's   x 3  . HERNIA REPAIR  1970's,15   lft ing hernia  . INGUINAL HERNIA REPAIR Left 06/28/2014   Procedure: Left  INGUINAL HERNIA REPAIR ;  Surgeon: Merrie Roof, MD;  [facility];  Service: General;  Laterality: Left;  . INSERTION OF MESH Left 06/28/2014   Procedure: INSERTION OF MESH;  Surgeon: Merrie Roof, MD;  [facility];  Service: General;  Laterality: Left;  . INSERTION OF MESH N/A 10/11/2014  . LAPAROSCOPY  10/11/2014  . LAPAROTOMY N/A 09/11/2015   Procedure: EXPLORATORY LAPAROTOMY;  Surgeon: Excell Seltzer, MD;  [facility];  Service: General;  Laterality: N/A;  . MASS EXCISION Left  03/21/2017   Procedure: EXCISION BASAL CELL CARCINOMA LEFT EAR;  Surgeon: Leta Baptist, MD;  Location: [place];  Service: ENT;  Laterality: Left;  . PARTIAL COLECTOMY N/A 09/11/2015   Procedure: SIGMOID COLECTOMY AND COLOSTOMY;  Surgeon: Excell Seltzer, MD;  [facility];  Service: General;  Laterality: N/A;  . right leg fracture age 53    . SKIN FULL THICKNESS GRAFT Left 03/21/2017   Procedure: FULL THICKNESS SKIN GRAFT TO LEFT EAR;  Surgeon: Leta Baptist, MD;  Location: [place];  Service: ENT;  Laterality: Left;  . TONSILLECTOMY  age 89 or 58  . VENTRAL HERNIA REPAIR  10/11/2014   Procedure: HERNIA REPAIR VENTRAL ADULT;  Surgeon: Autumn Messing III, MD;  Location: [place];  Service: General;;     Medications: Current Meds  Medication Sig  . acetaminophen (TYLENOL) 325 MG tablet Take 325 mg by mouth every 6 (six) hours as needed for mild pain.   [name] amLODipine (NORVASC) 5 MG tablet Take 5 mg by mouth daily.  . Artificial Tear Solution (SOOTHE XP OP) Apply 1 drop to eye daily.  . Cyanocobalamin (VITAMIN B-12 PO) Take 1 tablet by mouth daily.  . ferrous sulfate 325 (65 FE) MG tablet Take 325 mg by mouth daily with breakfast.  . hydrochlorothiazide (HYDRODIURIL) 25 MG tablet Take 12.5 mg by mouth daily.  [name] HYDROcodone-acetaminophen (NORCO/VICODIN) 5-325 MG tablet Take 1 tablet by mouth every 6 (six) hours as needed for moderate pain.  [name] ipratropium (ATROVENT) 0.03 % nasal spray Place 2 sprays into both nostrils 2 (two) times daily as needed for rhinitis.  [name] isosorbide mononitrate (IMDUR) 60 MG 24 hr tablet Take 60 mg by mouth every evening.   . latanoprost (XALATAN) 0.005 % ophthalmic solution Place 1 drop into both eyes at bedtime.  [name] levothyroxine (SYNTHROID, LEVOTHROID) 50 MCG tablet Take 50 mcg by mouth daily before breakfast.  . metFORMIN (GLUCOPHAGE) 1000 MG tablet Take 1,000 mg by mouth daily with breakfast.   . metoprolol succinate (TOPROL-XL) 50 MG 24 hr tablet  Take 25 mg by mouth daily. Take with or immediately following a meal.   . Multiple Vitamin (MULTIVITAMIN WITH MINERALS) TABS tablet Take 1 tablet by mouth daily.  . pantoprazole (PROTONIX) 40 MG tablet Take 40 mg by mouth daily.  . simvastatin (ZOCOR) 20 MG tablet Take 20 mg by mouth every evening.  . [DISCONTINUED] metoprolol succinate (TOPROL-XL) 50 MG 24 hr tablet Take 1/2 tablet by mouth daily (Patient taking differently: Take  50 mg by mouth daily. Take 1/2 tablet by mouth daily)  . [DISCONTINUED] potassium chloride (K-DUR,KLOR-CON) 10 MEQ tablet Take 10 mEq by mouth daily.      Allergies: Allergies  Allergen Reactions  . Lasix [Furosemide] Other (See Comments)    aches  . Spironolactone Other (See Comments)    Red, puffy, eyes, almost hospitalized  . Aspirin Other (See Comments)    Bleeding.    . Lipitor [Atorvastatin] Other (See Comments)    tired  . Lopressor [Metoprolol Tartrate] Other (See Comments)    Sleepy all the time  . Penicillins Hives and Rash    Has patient had a PCN reaction causing immediate rash, facial/tongue/throat swelling, SOB or lightheadedness with hypotension: No Has patient had a PCN reaction causing severe rash involving mucus membranes or skin necrosis: No Has patient had a PCN reaction that required hospitalization No Has patient had a PCN reaction occurring within the last 10 years: Yes If all of the above answers are "NO", then may proceed with Cephalosporin use.     Social History: The patient  reports that he has quit smoking. His smoking use included cigars. He has never used smokeless tobacco. He reports that he drinks alcohol. He reports that he does not use drugs.   Family History: The patient's family history includes CAD in his father; Cancer in his mother; Hyperlipidemia in his father.   Review of Systems: Please see the history of present illness.   Otherwise, the review of systems is positive for none.   All other systems are reviewed  and negative.   Physical Exam: VS:  BP (!) 160/80 (BP Location: Left Arm, Patient Position: Sitting, Cuff Size: Normal)   Pulse 93   Ht 5' 10.5" (1.791 m)   Wt 160 lb 12.8 oz (72.9 kg)   SpO2 98% Comment: at rest  BMI 22.75 kg/m  .  BMI Body mass index is 22.75 kg/m.  Wt Readings from Last 3 Encounters:  03/13/18 160 lb 12.8 oz (72.9 kg)  02/21/18 165 lb (74.8 kg)  02/14/18 168 lb (76.2 kg)    General: Elderly. Alert and in no acute distress.   HEENT: Normal.  Neck: Supple, no JVD, carotid bruits, or masses noted.  Cardiac: Regular rate and rhythm. No edema.  Respiratory:  Lungs are clear to auscultation bilaterally with normal work of breathing.  GI: Soft and nontender.  MS: No deformity or atrophy. Gait and ROM intact.  Skin: Warm and dry. Color is normal.  Neuro:  Strength and sensation are intact and no gross focal deficits noted.  Psych: Alert, appropriate and with normal affect.   LABORATORY DATA:  EKG:  EKG is not ordered today.  Lab Results  Component Value Date   WBC 11.1 (H) 02/21/2018   HGB 13.2 02/21/2018   HCT 40.3 02/21/2018   PLT 357 02/21/2018   GLUCOSE 146 (H) 02/21/2018   ALT 14 (L) 09/11/2017   AST 24 09/11/2017   NA 140 02/21/2018   K 4.1 02/21/2018   CL 98 02/21/2018   CREATININE 0.80 02/21/2018   BUN 16 02/21/2018   CO2 24 02/21/2018   INR 0.95 09/09/2015   HGBA1C 6.7 (H) 03/10/2016     BNP (last 3 results) No results for input(s): BNP in the last 8760 hours.  ProBNP (last 3 results) Recent Labs    09/30/17 1058 02/21/18 1221  PROBNP 280 467     Other Studies Reviewed Today:  Echo Study Conclusions 02/2018  -  Left ventricle: The cavity size was normal. Systolic function was   normal. The estimated ejection fraction was in the range of 60%   to 65%. Wall motion was normal; there were no regional wall   motion abnormalities. Doppler parameters are consistent with   abnormal left ventricular relaxation (grade 1 diastolic    dysfunction). Doppler parameters are consistent with elevated   ventricular end-diastolic filling pressure. - Aortic valve: Sclerosis without stenosis. Transvalvular velocity   was within the normal range. There was no stenosis. There was no   regurgitation. - Mitral valve: There was mild regurgitation. - Left atrium: The atrium was normal in size. - Right ventricle: The cavity size was normal. Wall thickness was   normal. Systolic function was normal. - Tricuspid valve: There was mild regurgitation. - Pulmonary arteries: Systolic pressure was within the normal   range.  Assessment/Plan:  1. CAD - Dr. Tamala Julian has favored conservative management - and with his age and bleeding tendencies we should make every attempt to manage him with medical therapy rather than invasive approach due to decrease likelihood of being able to maintain dual antiplatelet therapy. I do not think he is having chest pain. He seems to be holding his own for now.  2. Worsening dyspnea - probably multifactorial. This could certainly be his anginal equivalent.  In any event, it is now improved. I have left him on his current regimen.   3. Chronic diastolic HF - he has restarted his HCTZ - at half dose. He says he is feeling better. He does not wish to take Lasix or Aldactone any more. I would favor a conservative approach.   4. Advanced age    Current medicines are reviewed with the patient today.  The patient does not have concerns regarding medicines other than what has been noted above.  The following changes have been made:  See above.  Labs/ tests ordered today include:   No orders of the defined types were placed in this encounter.    Disposition:   FU with Dr. Tamala Julian as planned in June.   Patient is agreeable to this plan and will call if any problems develop in the interim.   SignedTruitt Merle, NP  03/13/2018 12:15 PM  [facility] [address] [place], [place]  [address] Phone: [phone] Fax: [phone]

## 2018-05-28 NOTE — Progress Notes (Deleted)
Cardiology Office Note    Date:  05/28/2018   ID:  Kenneth [name], DOB 1928-05-20, MRN [id number]  PCP:  Lavone Orn, MD  Cardiologist: Kenneth Grooms, MD   No chief complaint on file.   History of Present Illness:  Kenneth [name] is a 84 y.o. male coronary artery disease with prior bypass surgery, hypertension, hyperlipidemia, hypertension, and history of GI bleeding.     Past Medical History:  Diagnosis Date  . Anemia    on iron  . Anginal pain ([place])    last 1-2 MONTHS ago goes away if stop and rest  . Anxiety   . Arthritis   . Basal cell carcinoma    left ear  . CAD (coronary artery disease)    a. CAD s/p CABG 1993.  [name] Chronic cough   . Chronic rhinitis   . Complication of anesthesia   . Coronary atherosclerosis of unspecified type of vessel, native or graft    CABG 1993, LIMA to LAD, SVG to obtuse marginal one, SVG to posterior descending, stable angina  . Diabetes ([place])   . Difficulty sleeping   . Diverticulosis of colon   . Dizziness   . Dysrhythmia    occ "skips a beat"  . GERD (gastroesophageal reflux disease)   . Glaucoma   . H/O hiatal hernia   . Hernia    left side  . History of gout   . History of kidney stones   . HTN (hypertension)   . Hyperlipidemia `  . Melanoma of skin, site unspecified   . Numbness    TOES  . PONV (postoperative nausea and vomiting)   . [facility] spotted fever 2-3 yrs ago  . Thyroid disease     Past Surgical History:  Procedure Laterality Date  . COLONOSCOPY WITH PROPOFOL N/A 11/27/2013   Procedure: COLONOSCOPY WITH PROPOFOL;  Surgeon: Garlan Fair, MD;  [facility];  Service: Endoscopy;  Laterality: N/A;  . COLOSTOMY TAKEDOWN N/A 03/16/2016   Procedure: COLOSTOMY TAKEDOWN;  Surgeon: Excell Seltzer, MD;  [facility];  Service: General;  Laterality: N/A;  . CORONARY ARTERY BYPASS GRAFT  1990's   x 3  . HERNIA REPAIR  1970's,15   lft ing hernia  . INGUINAL HERNIA REPAIR Left  06/28/2014   Procedure: Left  INGUINAL HERNIA REPAIR ;  Surgeon: Merrie Roof, MD;  [facility];  Service: General;  Laterality: Left;  . INSERTION OF MESH Left 06/28/2014   Procedure: INSERTION OF MESH;  Surgeon: Merrie Roof, MD;  [facility];  Service: General;  Laterality: Left;  . INSERTION OF MESH N/A 10/11/2014  . LAPAROSCOPY  10/11/2014  . LAPAROTOMY N/A 09/11/2015   Procedure: EXPLORATORY LAPAROTOMY;  Surgeon: Excell Seltzer, MD;  [facility];  Service: General;  Laterality: N/A;  . MASS EXCISION Left 03/21/2017   Procedure: EXCISION BASAL CELL CARCINOMA LEFT EAR;  Surgeon: Leta Baptist, MD;  Location: [place];  Service: ENT;  Laterality: Left;  . PARTIAL COLECTOMY N/A 09/11/2015   Procedure: SIGMOID COLECTOMY AND COLOSTOMY;  Surgeon: Excell Seltzer, MD;  [facility];  Service: General;  Laterality: N/A;  . right leg fracture age 60    . SKIN FULL THICKNESS GRAFT Left 03/21/2017   Procedure: FULL THICKNESS SKIN GRAFT TO LEFT EAR;  Surgeon: Leta Baptist, MD;  Location: [facility];  Service: ENT;  Laterality: Left;  . TONSILLECTOMY  age 89 or  [place]  10/11/2014   Procedure: HERNIA REPAIR VENTRAL ADULT;  Surgeon: Autumn Messing III, MD;  Location: [facility];  Service: General;;    Current Medications: Outpatient Medications Prior to Visit  Medication Sig Dispense Refill  . acetaminophen (TYLENOL) 325 MG tablet Take 325 mg by mouth every 6 (six) hours as needed for mild pain.     [name] amLODipine (NORVASC) 5 MG tablet Take 5 mg by mouth daily.    . Artificial Tear Solution (SOOTHE XP OP) Apply 1 drop to eye daily.    . Cyanocobalamin (VITAMIN B-12 PO) Take 1 tablet by mouth daily.    . ferrous sulfate 325 (65 FE) MG tablet Take 325 mg by mouth daily with breakfast.    . hydrochlorothiazide (HYDRODIURIL) 25 MG tablet Take 12.5 mg by mouth daily.    [name] HYDROcodone-acetaminophen (NORCO/VICODIN) 5-325 MG tablet Take 1 tablet by mouth  every 6 (six) hours as needed for moderate pain.    [name] ipratropium (ATROVENT) 0.03 % nasal spray Place 2 sprays into both nostrils 2 (two) times daily as needed for rhinitis.    [name] isosorbide mononitrate (IMDUR) 60 MG 24 hr tablet Take 60 mg by mouth every evening.     . latanoprost (XALATAN) 0.005 % ophthalmic solution Place 1 drop into both eyes at bedtime.    [name] levothyroxine (SYNTHROID, LEVOTHROID) 50 MCG tablet Take 50 mcg by mouth daily before breakfast.    . metFORMIN (GLUCOPHAGE) 1000 MG tablet Take 1,000 mg by mouth daily with breakfast.     . metoprolol succinate (TOPROL-XL) 50 MG 24 hr tablet Take 25 mg by mouth daily. Take with or immediately following a meal.     . Multiple Vitamin (MULTIVITAMIN WITH MINERALS) TABS tablet Take 1 tablet by mouth daily.    . pantoprazole (PROTONIX) 40 MG tablet Take 40 mg by mouth daily.    . simvastatin (ZOCOR) 20 MG tablet Take 20 mg by mouth every evening.     No facility-administered medications prior to visit.      Allergies:   Lasix [furosemide]; Spironolactone; Aspirin; Lipitor [atorvastatin]; Lopressor [metoprolol tartrate]; and Penicillins   Social History   Socioeconomic History  . Marital status: Married    Spouse name: Not on file  . Number of children: Not on file  . Years of education: Not on file  . Highest education level: Not on file  Occupational History  . Not on file  Social Needs  . Financial resource strain: Not on file  . Food insecurity:    Worry: Not on file    Inability: Not on file  . Transportation needs:    Medical: Not on file    Non-medical: Not on file  Tobacco Use  . Smoking status: Former Smoker    Types: Cigars  . Smokeless tobacco: Never Used  Substance and Sexual Activity  . Alcohol use: Yes    Comment: RARE  . Drug use: No    Comment: occ cigars  . Sexual activity: Not on file  Lifestyle  . Physical activity:    Days per week: Not on file    Minutes per session: Not on file  . Stress: Not  on file  Relationships  . Social connections:    Talks on phone: Not on file    Gets together: Not on file    Attends religious service: Not on file    Active member of club or organization: Not on file  Attends meetings of clubs or organizations: Not on file    Relationship status: Not on file  Other Topics Concern  . Not on file  Social History Narrative  . Not on file     Family History:  The patient's ***family history includes CAD in his father; Cancer in his mother; Hyperlipidemia in his father.   ROS:   Please see the history of present illness.    ***  All other systems reviewed and are negative.   PHYSICAL EXAM:   VS:  There were no vitals taken for this visit.   GEN: Well nourished, well developed, in no acute distress  HEENT: normal  Neck: no JVD, carotid bruits, or masses Cardiac: ***RRR; no murmurs, rubs, or gallops,no edema  Respiratory:  clear to auscultation bilaterally, normal work of breathing GI: soft, nontender, nondistended, + BS MS: no deformity or atrophy  Skin: warm and dry, no rash Neuro:  Alert and Oriented x 3, Strength and sensation are intact Psych: euthymic mood, full affect  Wt Readings from Last 3 Encounters:  03/13/18 160 lb 12.8 oz (72.9 kg)  02/21/18 165 lb (74.8 kg)  02/14/18 168 lb (76.2 kg)      Studies/Labs Reviewed:   EKG:  EKG  ***  Recent Labs: 09/11/2017: ALT 14 02/21/2018: BUN 16; Creatinine, Ser 0.80; Hemoglobin 13.2; NT-Pro BNP 467; Platelets 357; Potassium 4.1; Sodium 140   Lipid Panel No results found for: CHOL, TRIG, HDL, CHOLHDL, VLDL, LDLCALC, LDLDIRECT  Additional studies/ records that were reviewed today include:  ***    ASSESSMENT:    1. Type 2 diabetes mellitus with complication, without long-term current use of insulin ([place])   2. Hyperlipidemia with target LDL less than 70   3. Essential hypertension   4. Coronary artery disease involving coronary bypass graft of native heart with angina pectoris  (HCC)      PLAN:  In order of problems listed above:  1. ***    Medication Adjustments/Labs and Tests Ordered: Current medicines are reviewed at length with the patient today.  Concerns regarding medicines are outlined above.  Medication changes, Labs and Tests ordered today are listed in the Patient Instructions below. There are no Patient Instructions on file for this visit.   Signed, Kenneth Grooms, MD  05/28/2018 4:36 PM    [facility] [place], [place], [place]  [address] Phone: [phone]; Fax: [phone]

## 2018-05-29 ENCOUNTER — Ambulatory Visit: Payer: Medicare Other | Admitting: Interventional Cardiology

## 2018-05-30 ENCOUNTER — Encounter: Payer: Self-pay | Admitting: Interventional Cardiology

## 2018-08-22 ENCOUNTER — Ambulatory Visit (INDEPENDENT_AMBULATORY_CARE_PROVIDER_SITE_OTHER): Payer: Medicare Other | Admitting: Cardiology

## 2018-08-22 ENCOUNTER — Encounter: Payer: Self-pay | Admitting: Cardiology

## 2018-08-22 ENCOUNTER — Encounter

## 2018-08-22 VITALS — BP 116/72 | HR 66 | Ht 70.0 in | Wt 158.0 lb

## 2018-08-22 DIAGNOSIS — R42 Dizziness and giddiness: Secondary | ICD-10-CM

## 2018-08-22 DIAGNOSIS — I5032 Chronic diastolic (congestive) heart failure: Secondary | ICD-10-CM

## 2018-08-22 DIAGNOSIS — I2510 Atherosclerotic heart disease of native coronary artery without angina pectoris: Secondary | ICD-10-CM

## 2018-08-22 NOTE — Progress Notes (Signed)
Cardiology Office Note   Date:  08/22/2018   ID:  [name], DOB 1928-11-02, MRN [id number]  PCP:  Lavone Orn, MD  Cardiologist:  Baron Sane     Chief Complaint  Patient presents with  . Coronary Artery Disease      History of Present Illness: Kenneth [name] is a 84 y.o. male who presents for CAD.  He has a history of CADwith priorremotebypass surgery, hypertension, hyperlipidemia, hypertension, andpriorhistory of GI bleeding.  Seen here in March by Dr. Tamala Julian - was not doing well. Worsening dyspnea. No real chest pain but maybe having some discomfort. His medicines had gotten mixed up from a prior visit. Dr.Smith stopped HCTZ and started Lasix. He had an echocardiogram. Dr. Tamala Julian has hoped for conservative management given history of prior GI bleeding.  Has had medication non compliance at times.  And side effects.     Today he has no chest pain, some dyspnea at times, but overall no new changes.  He tries to eat healthy.  He is active but no longer mows yard.  He continues with dizziness and his PCP feels it is related to [place] spotted fever.    Past Medical History:  Diagnosis Date  . Anemia    on iron  . Anginal pain ([place])    last 1-2 MONTHS ago goes away if stop and rest  . Anxiety   . Arthritis   . Basal cell carcinoma    left ear  . CAD (coronary artery disease)    a. CAD s/p CABG 1993.  [name] Chronic cough   . Chronic rhinitis   . Complication of anesthesia   . Coronary atherosclerosis of unspecified type of vessel, native or graft    CABG 1993, LIMA to LAD, SVG to obtuse marginal one, SVG to posterior descending, stable angina  . Diabetes ([place])   . Difficulty sleeping   . Diverticulosis of colon   . Dizziness   . Dysrhythmia    occ "skips a beat"  . GERD (gastroesophageal reflux disease)   . Glaucoma   . H/O hiatal hernia   . Hernia    left side  . History of gout   . History of kidney stones   . HTN (hypertension)     . Hyperlipidemia `  . Melanoma of skin, site unspecified   . Numbness    TOES  . PONV (postoperative nausea and vomiting)   . [facility] spotted fever 2-3 yrs ago  . Thyroid disease     Past Surgical History:  Procedure Laterality Date  . COLONOSCOPY WITH PROPOFOL N/A 11/27/2013   Procedure: COLONOSCOPY WITH PROPOFOL;  Surgeon: Garlan Fair, MD;  [facility];  Service: Endoscopy;  Laterality: N/A;  . COLOSTOMY TAKEDOWN N/A 03/16/2016   Procedure: COLOSTOMY TAKEDOWN;  Surgeon: Excell Seltzer, MD;  [facility];  Service: General;  Laterality: N/A;  . CORONARY ARTERY BYPASS GRAFT  1990's   x 3  . HERNIA REPAIR  1970's,17   lft ing hernia  . INGUINAL HERNIA REPAIR Left 06/28/2014   Procedure: Left  INGUINAL HERNIA REPAIR ;  Surgeon: Merrie Roof, MD;  [facility];  Service: General;  Laterality: Left;  . INSERTION OF MESH Left 06/28/2014   Procedure: INSERTION OF MESH;  Surgeon: Merrie Roof, MD;  [facility];  Service: General;  Laterality: Left;  . INSERTION OF MESH N/A 10/11/2014  . LAPAROSCOPY  10/11/2014  . LAPAROTOMY  N/A 09/11/2015   Procedure: EXPLORATORY LAPAROTOMY;  Surgeon: Excell Seltzer, MD;  [facility];  Service: General;  Laterality: N/A;  . MASS EXCISION Left 03/21/2017   Procedure: EXCISION BASAL CELL CARCINOMA LEFT EAR;  Surgeon: Leta Baptist, MD;  Location: [place];  Service: ENT;  Laterality: Left;  . PARTIAL COLECTOMY N/A 09/11/2015   Procedure: SIGMOID COLECTOMY AND COLOSTOMY;  Surgeon: Excell Seltzer, MD;  [facility];  Service: General;  Laterality: N/A;  . right leg fracture age 15    . SKIN FULL THICKNESS GRAFT Left 03/21/2017   Procedure: FULL THICKNESS SKIN GRAFT TO LEFT EAR;  Surgeon: Leta Baptist, MD;  Location: [place];  Service: ENT;  Laterality: Left;  . TONSILLECTOMY  age 15 or 22  . VENTRAL HERNIA REPAIR  10/11/2014   Procedure: HERNIA REPAIR VENTRAL ADULT;  Surgeon: Autumn Messing III, MD;  Location: [place] [facility];  Service: General;;     Current Outpatient Medications  Medication Sig Dispense Refill  . acetaminophen (TYLENOL) 325 MG tablet Take 325 mg by mouth every 6 (six) hours as needed for mild pain.     [name] amLODipine (NORVASC) 5 MG tablet Take 5 mg by mouth daily.    . Artificial Tear Solution (SOOTHE XP OP) Apply 1 drop to eye daily.    . Cyanocobalamin (VITAMIN B-12 PO) Take 1 tablet by mouth daily.    . ferrous sulfate 325 (65 FE) MG tablet Take 325 mg by mouth daily with breakfast.    . hydrochlorothiazide (HYDRODIURIL) 25 MG tablet Take 12.5 mg by mouth daily.    [name] HYDROcodone-acetaminophen (NORCO/VICODIN) 5-325 MG tablet Take 1 tablet by mouth every 6 (six) hours as needed for moderate pain.    [name] ipratropium (ATROVENT) 0.03 % nasal spray Place 2 sprays into both nostrils 2 (two) times daily as needed for rhinitis.    [name] isosorbide mononitrate (IMDUR) 60 MG 24 hr tablet Take 60 mg by mouth every evening.     . latanoprost (XALATAN) 0.005 % ophthalmic solution Place 1 drop into both eyes at bedtime.    [name] levothyroxine (SYNTHROID, LEVOTHROID) 50 MCG tablet Take 50 mcg by mouth daily before breakfast.    . metFORMIN (GLUCOPHAGE) 1000 MG tablet Take 1,000 mg by mouth daily with breakfast.     . metoprolol succinate (TOPROL-XL) 50 MG 24 hr tablet Take 25 mg by mouth daily. Take with or immediately following a meal.     . mirtazapine (REMERON) 15 MG tablet Take 1 tablet by mouth daily.  11  . Multiple Vitamin (MULTIVITAMIN WITH MINERALS) TABS tablet Take 1 tablet by mouth daily.    . pantoprazole (PROTONIX) 40 MG tablet Take 40 mg by mouth daily.    . potassium chloride (K-DUR,KLOR-CON) 10 MEQ tablet Take 1 tablet by mouth daily.    . simvastatin (ZOCOR) 20 MG tablet Take 20 mg by mouth every evening.     No current facility-administered medications for this visit.     Allergies:   Lasix [furosemide]; Spironolactone; Aspirin; Lipitor [atorvastatin]; Lopressor  [metoprolol tartrate]; and Penicillins    Social History:  The patient  reports that he has quit smoking. His smoking use included cigars. He has never used smokeless tobacco. He reports that he drinks alcohol. He reports that he does not use drugs.   Family History:  The patient's family history includes CAD in his father; Cancer in his mother; Hyperlipidemia in his father.    ROS:  General:no colds  or fevers, no weight changes Skin:no rashes or ulcers HEENT:no blurred vision, no congestion CV:see HPI PUL:see HPI GI:no diarrhea constipation or melena, no indigestion GU:no hematuria, no dysuria MS:no joint pain, no claudication Neuro:no syncope, no lightheadedness Endo:no diabetes, no thyroid disease Wt Readings from Last 3 Encounters:  08/22/18 158 lb (71.7 kg)  03/13/18 160 lb 12.8 oz (72.9 kg)  02/21/18 165 lb (74.8 kg)     PHYSICAL EXAM: VS:  BP 116/72   Pulse 66   Ht 5\' 10"  (1.778 m)   Wt 158 lb (71.7 kg)   SpO2 97%   BMI 22.67 kg/m  , BMI Body mass index is 22.67 kg/m. General:Pleasant affect, NAD, somewhat frail Skin:Warm and dry, brisk capillary refill HEENT:normocephalic, sclera clear, mucus membranes moist Neck:supple, no JVD, no bruits  Heart:S1S2 RRR without murmur, gallup, rub or click Lungs:clear without rales, rhonchi, or wheezes JSH:FWYO, non tender, + BS, do not palpate liver spleen or masses Ext:no lower ext edema, 2+ pedal pulses, 2+ radial pulses Neuro:alert and oriented X 3, MAE, follows commands, + facial symmetry    EKG:  EKG is NOT ordered today.    Recent Labs: 09/11/2017: ALT 14 02/21/2018: BUN 16; Creatinine, Ser 0.80; Hemoglobin 13.2; NT-Pro BNP 467; Platelets 357; Potassium 4.1; Sodium 140    Lipid Panel No results found for: CHOL, TRIG, HDL, CHOLHDL, VLDL, LDLCALC, LDLDIRECT     Other studies Reviewed: Additional studies/ records that were reviewed today include:   Echo Study Conclusions 02/2018  - Left ventricle: The cavity  size was normal. Systolic function was normal. The estimated ejection fraction was in the range of 60% to 65%. Wall motion was normal; there were no regional wall motion abnormalities. Doppler parameters are consistent with abnormal left ventricular relaxation (grade 1 diastolic dysfunction). Doppler parameters are consistent with elevated ventricular end-diastolic filling pressure. - Aortic valve: Sclerosis without stenosis. Transvalvular velocity was within the normal range. There was no stenosis. There was no regurgitation. - Mitral valve: There was mild regurgitation. - Left atrium: The atrium was normal in size. - Right ventricle: The cavity size was normal. Wall thickness was normal. Systolic function was normal. - Tricuspid valve: There was mild regurgitation. - Pulmonary arteries: Systolic pressure was within the normal range.  ASSESSMENT AND PLAN:  1.  CAD plan for medical management continue current meds  2.  Chronic diastolic HF euvolemic today stable dyspnea  3.   Chronic dizziness no syncope and no awareness of rapid or irregular HR.     Current medicines are reviewed with the patient today.  The patient Has no concerns regarding medicines.  The following changes have been made:  See above Labs/ tests ordered today include:see above  Disposition:   FU:  see above  Signed, Cecilie Kicks, NP  08/22/2018 10:14 AM    [address], [place], [place], [place] Phone: [phone]; Fax: [phone]

## 2018-08-22 NOTE — Patient Instructions (Signed)
Medication Instructions: Your physician recommends that you continue on your current medications as directed. Please refer to the Current Medication list given to you today.    Labwork: None ordered   Testing/Procedures: none ordered    Follow-Up:Your physician recommends that you schedule a follow-up appointment in: 4-5 months with Dr. Tamala Julian      Any Other Special Instructions Will Be Listed Below (If Applicable).     If you need a refill on your cardiac medications before your next appointment, please call your pharmacy.

## 2018-09-24 ENCOUNTER — Other Ambulatory Visit: Payer: Self-pay | Admitting: Interventional Cardiology

## 2018-10-02 ENCOUNTER — Other Ambulatory Visit: Payer: Self-pay

## 2018-10-02 MED ORDER — METOPROLOL SUCCINATE ER 50 MG PO TB24: ORAL_TABLET | ORAL | 3 refills | Status: DC

## 2019-01-14 NOTE — Progress Notes (Signed)
Cardiology Office Note:    Date:  01/15/2019   ID:  MILANO [name], DOB [birth year], MRN [id number]  PCP:  Lavone Orn, MD  Cardiologist:  Sinclair Grooms, MD   Referring MD: Lavone Orn, MD   Chief Complaint  Patient presents with  . Coronary Artery Disease    History of Present Illness:    Kenneth [name] is a 85 y.o. male with a hx of remotebypass surgery, hypertension, hyperlipidemia, hypertension, andpriorhistory of GI bleeding   [name] is doing relatively well.  He has a various assortment of aches and pains.  Most prominent among these are left lower flank puffiness, right foot pain that he feels is related to neuropathy from diabetes, and dizziness with instability while walking.  He denies chest discomfort or excessive dyspnea.  He tires more quickly than he did 1 to 2 years ago.  The fatigue is not associated with any pain.  He denies peripheral edema.  He has not had palpitations or syncope.  He is quite physically active and is responsible for taking care of his wife who has cognitive impairment.  He does all the cooking, cleaning, and chores outside the house.  Past Medical History:  Diagnosis Date  . Anemia    on iron  . Anginal pain ([place])    last 1-2 MONTHS ago goes away if stop and rest  . Anxiety   . Arthritis   . Basal cell carcinoma    left ear  . CAD (coronary artery disease)    a. CAD s/p CABG 1993.  [name] Chronic cough   . Chronic rhinitis   . Complication of anesthesia   . Coronary atherosclerosis of unspecified type of vessel, native or graft    CABG 1993, LIMA to LAD, SVG to obtuse marginal one, SVG to posterior descending, stable angina  . Diabetes ([place])   . Difficulty sleeping   . Diverticulosis of colon   . Dizziness   . Dysrhythmia    occ "skips a beat"  . GERD (gastroesophageal reflux disease)   . Glaucoma   . H/O hiatal hernia   . Hernia    left side  . History of gout   . History of kidney stones   . HTN (hypertension)     . Hyperlipidemia `  . Melanoma of skin, site unspecified   . Numbness    TOES  . PONV (postoperative nausea and vomiting)   . [facility] spotted fever 2-3 yrs ago  . Thyroid disease     Past Surgical History:  Procedure Laterality Date  . COLONOSCOPY WITH PROPOFOL N/A 11/27/2013   Procedure: COLONOSCOPY WITH PROPOFOL;  Surgeon: Garlan Fair, MD;  [facility];  Service: Endoscopy;  Laterality: N/A;  . COLOSTOMY TAKEDOWN N/A 03/16/2016   Procedure: COLOSTOMY TAKEDOWN;  Surgeon: Excell Seltzer, MD;  [facility];  Service: General;  Laterality: N/A;  . CORONARY ARTERY BYPASS GRAFT  1990's   x 3  . HERNIA REPAIR  1970's,17   lft ing hernia  . INGUINAL HERNIA REPAIR Left 06/28/2014   Procedure: Left  INGUINAL HERNIA REPAIR ;  Surgeon: Merrie Roof, MD;  [facility];  Service: General;  Laterality: Left;  . INSERTION OF MESH Left 06/28/2014   Procedure: INSERTION OF MESH;  Surgeon: Merrie Roof, MD;  [facility];  Service: General;  Laterality: Left;  . INSERTION OF MESH N/A 10/11/2014  . LAPAROSCOPY  10/11/2014  . LAPAROTOMY N/A 09/11/2015  Procedure: EXPLORATORY LAPAROTOMY;  Surgeon: Excell Seltzer, MD;  [facility];  Service: General;  Laterality: N/A;  . MASS EXCISION Left 03/21/2017   Procedure: EXCISION BASAL CELL CARCINOMA LEFT EAR;  Surgeon: Leta Baptist, MD;  Location: [place];  Service: ENT;  Laterality: Left;  . PARTIAL COLECTOMY N/A 09/11/2015   Procedure: SIGMOID COLECTOMY AND COLOSTOMY;  Surgeon: Excell Seltzer, MD;  [facility];  Service: General;  Laterality: N/A;  . right leg fracture age 71    . SKIN FULL THICKNESS GRAFT Left 03/21/2017   Procedure: FULL THICKNESS SKIN GRAFT TO LEFT EAR;  Surgeon: Leta Baptist, MD;  Location: [place];  Service: ENT;  Laterality: Left;  . TONSILLECTOMY  age 62 or 89  . VENTRAL HERNIA REPAIR  10/11/2014   Procedure: HERNIA REPAIR VENTRAL ADULT;  Surgeon: Autumn Messing III, MD;  Location: [place] [facility];  Service: General;;    Current Medications: Current Meds  Medication Sig  . acetaminophen (TYLENOL) 325 MG tablet Take 325 mg by mouth every 6 (six) hours as needed for mild pain.   [name] amLODipine (NORVASC) 5 MG tablet Take 5 mg by mouth daily.  . Artificial Tear Solution (SOOTHE XP OP) Apply 1 drop to eye daily.  . Cyanocobalamin (VITAMIN B-12 PO) Take 1 tablet by mouth daily.  . ferrous sulfate 325 (65 FE) MG tablet Take 325 mg by mouth daily with breakfast.  . hydrochlorothiazide (HYDRODIURIL) 25 MG tablet Take 12.5 mg by mouth daily.  [name] HYDROcodone-acetaminophen (NORCO/VICODIN) 5-325 MG tablet Take 1 tablet by mouth every 6 (six) hours as needed for moderate pain.  . isosorbide mononitrate (IMDUR) 60 MG 24 hr tablet Take 60 mg by mouth every evening.   . latanoprost (XALATAN) 0.005 % ophthalmic solution Place 1 drop into both eyes at bedtime.  [name] levothyroxine (SYNTHROID, LEVOTHROID) 75 MCG tablet Take 50 mcg by mouth daily before breakfast.   . metFORMIN (GLUCOPHAGE) 1000 MG tablet Take 1,000 mg by mouth daily with breakfast.   . metoprolol succinate (TOPROL-XL) 50 MG 24 hr tablet Take half a tablet (25mg )  daily with or after a meal.  . Multiple Vitamin (MULTIVITAMIN WITH MINERALS) TABS tablet Take 1 tablet by mouth daily.  . pantoprazole (PROTONIX) 40 MG tablet Take 40 mg by mouth daily.  . potassium chloride (K-DUR,KLOR-CON) 10 MEQ tablet Take 1 tablet by mouth daily.  . simvastatin (ZOCOR) 20 MG tablet Take 20 mg by mouth every evening.     Allergies:   Lasix [furosemide]; Spironolactone; Aspirin; Lipitor [atorvastatin]; Lopressor [metoprolol tartrate]; and Penicillins   Social History   Socioeconomic History  . Marital status: Married    Spouse name: Not on file  . Number of children: Not on file  . Years of education: Not on file  . Highest education level: Not on file  Occupational History  . Not on file  Social Needs  . Financial resource  strain: Not on file  . Food insecurity:    Worry: Not on file    Inability: Not on file  . Transportation needs:    Medical: Not on file    Non-medical: Not on file  Tobacco Use  . Smoking status: Former Smoker    Types: Cigars  . Smokeless tobacco: Never Used  Substance and Sexual Activity  . Alcohol use: Yes    Comment: RARE  . Drug use: No    Comment: occ cigars  . Sexual activity: Not on file  Lifestyle  .  Physical activity:    Days per week: Not on file    Minutes per session: Not on file  . Stress: Not on file  Relationships  . Social connections:    Talks on phone: Not on file    Gets together: Not on file    Attends religious service: Not on file    Active member of club or organization: Not on file    Attends meetings of clubs or organizations: Not on file    Relationship status: Not on file  Other Topics Concern  . Not on file  Social History Narrative  . Not on file     Family History: The patient's family history includes CAD in his father; Cancer in his mother; Hyperlipidemia in his father.  ROS:   Please see the history of present illness.    Occasional diarrhea, nausea, vomiting, easy fatigue, decrease in appetite at times and other times increase in appetite, back pain, shortness of breath with activity, and occasional shooting chest pain.  All other systems reviewed and are negative.  EKGs/Labs/Other Studies Reviewed:    The following studies were reviewed today:  2D Doppler echocardiogram March 2019: Study Conclusions  - Left ventricle: The cavity size was normal. Systolic function was   normal. The estimated ejection fraction was in the range of 60%   to 65%. Wall motion was normal; there were no regional wall   motion abnormalities. Doppler parameters are consistent with   abnormal left ventricular relaxation (grade 1 diastolic   dysfunction). Doppler parameters are consistent with elevated   ventricular end-diastolic filling pressure. -  Aortic valve: Sclerosis without stenosis. Transvalvular velocity   was within the normal range. There was no stenosis. There was no   regurgitation. - Mitral valve: There was mild regurgitation. - Left atrium: The atrium was normal in size. - Right ventricle: The cavity size was normal. Wall thickness was   normal. Systolic function was normal. - Tricuspid valve: There was mild regurgitation. - Pulmonary arteries: Systolic pressure was within the normal   range.   EKG:  EKG normal sinus rhythm, overall normal appearance otherwise.  No change when compared to prior tracings most recently in October 2018.  Recent Labs: 02/21/2018: BUN 16; Creatinine, Ser 0.80; Hemoglobin 13.2; NT-Pro BNP 467; Platelets 357; Potassium 4.1; Sodium 140  Recent Lipid Panel No results found for: CHOL, TRIG, HDL, CHOLHDL, VLDL, LDLCALC, LDLDIRECT  Physical Exam:    VS:  BP 136/68   Pulse 63   Ht 5\' 10"  (1.778 m)   Wt 162 lb 6.4 oz (73.7 kg)   SpO2 96%   BMI 23.30 kg/m     Wt Readings from Last 3 Encounters:  01/15/19 162 lb 6.4 oz (73.7 kg)  08/22/18 158 lb (71.7 kg)  03/13/18 160 lb 12.8 oz (72.9 kg)     GEN: Somewhat pale appearing. No acute distress HEENT: Normal NECK: No JVD. LYMPHATICS: No lymphadenopathy CARDIAC: RRR.  No murmur, S4 gallop, no edema VASCULAR: 2+ bilateral radial and carotid pulses, no bruits RESPIRATORY:  Clear to auscultation without rales, wheezing or rhonchi  ABDOMEN: Soft, non-tender, non-distended, No pulsatile mass, MUSCULOSKELETAL: No deformity  SKIN: Warm and dry NEUROLOGIC:  Alert and oriented x 3 PSYCHIATRIC:  Normal affect   ASSESSMENT:    1. Coronary artery disease involving coronary bypass graft of native heart with angina pectoris ([place])   2. Hyperlipidemia, unspecified hyperlipidemia type   3. Type 2 diabetes mellitus with complication, without long-term current use of  insulin ([place])   4. Chronic diastolic heart failure ([place])   5. Essential hypertension     PLAN:    In order of problems listed above:  1. Kenneth [name] is doing well.  He is not having angina based upon current symptoms.  He does notice some dyspnea on exertion that is likely multifactorial.  We did discuss secondary prevention.  We did discuss SGLT2 therapy for added cardiovascular protection.  He is not in favor of adding additional medications at this time. 2. LDL target is 70.  Most recently was 44 and June 2019. 3. A1c was greater than 7.5 when evaluated in October.  SGLT2 therapy should be considered although the patient was not amenable on this occasion. 4. There is no evidence of volume overload on exam. 5. The blood pressure is thin good condition.  Overall [place] is doing relatively well.  He will be seen again in 6 months for follow-up of heart failure and coronary disease by a team member.  I will look forward to seeing him in 1 year.  He prefers that we keep his medication regimen and other issues as simple as possible given economics and ADH.   Medication Adjustments/Labs and Tests Ordered: Current medicines are reviewed at length with the patient today.  Concerns regarding medicines are outlined above.  Orders Placed This Encounter  Procedures  . EKG 12-Lead   No orders of the defined types were placed in this encounter.   Patient Instructions  Medication Instructions:  Your provider recommends that you continue on your current medications as directed. Please refer to the Current Medication list given to you today.    Labwork: None  Testing/Procedures: None  Follow-Up: You have an appointment scheduled with Truitt Merle on 07/23/2019 at 1:30PM.      Signed, Sinclair Grooms, MD  01/15/2019 12:22 PM    [place]

## 2019-01-15 ENCOUNTER — Encounter: Payer: Self-pay | Admitting: Interventional Cardiology

## 2019-01-15 ENCOUNTER — Ambulatory Visit (INDEPENDENT_AMBULATORY_CARE_PROVIDER_SITE_OTHER): Payer: Medicare Other | Admitting: Interventional Cardiology

## 2019-01-15 ENCOUNTER — Telehealth: Payer: Self-pay

## 2019-01-15 VITALS — BP 136/68 | HR 63 | Ht 70.0 in | Wt 162.4 lb

## 2019-01-15 DIAGNOSIS — I5032 Chronic diastolic (congestive) heart failure: Secondary | ICD-10-CM | POA: Diagnosis not present

## 2019-01-15 DIAGNOSIS — I25709 Atherosclerosis of coronary artery bypass graft(s), unspecified, with unspecified angina pectoris: Secondary | ICD-10-CM | POA: Diagnosis not present

## 2019-01-15 DIAGNOSIS — E785 Hyperlipidemia, unspecified: Secondary | ICD-10-CM | POA: Diagnosis not present

## 2019-01-15 DIAGNOSIS — I10 Essential (primary) hypertension: Secondary | ICD-10-CM

## 2019-01-15 DIAGNOSIS — E118 Type 2 diabetes mellitus with unspecified complications: Secondary | ICD-10-CM

## 2019-01-15 NOTE — Telephone Encounter (Signed)
Spoke with patient about adding Victoza to his antidiabetic regimen. He is hesitant to start a new agent and would like more time to consider before initiating. Patient given educational information and will follow up with any questions.

## 2019-01-15 NOTE — Patient Instructions (Addendum)
Medication Instructions:  Your provider recommends that you continue on your current medications as directed. Please refer to the Current Medication list given to you today.    Labwork: None  Testing/Procedures: None  Follow-Up: You have an appointment scheduled with Truitt Merle on 07/23/2019 at 1:30PM.

## 2019-01-15 NOTE — Telephone Encounter (Signed)
Patient meets inclusion criteria for current pharmacy residency project to initiate SGLT2i or GLP1-RA therapy for cardiovascular benefit due to current diagnosis of ASCVD and DM.  Patient has scheduled appointment with Dr. Tamala Julian on 01/15/19.  Pharmacist will discuss initiation of Victoza titrated to 1.8mg /day and enrollment into study with patient and provider at upcoming office visit.   Relevant labs and dates: Lab Results  Component Value Date   HGBA1C 6.7 (H) 03/10/2016   Lab Results  Component Value Date   CREATININE 0.80 02/21/2018   CREATININE 0.84 09/30/2017   CREATININE 0.93 09/11/2017   CrCl cannot be calculated (Patient's most recent lab result is older than the maximum 21 days allowed.). Wt Readings from Last 1 Encounters:  08/22/18 158 lb (71.7 kg)   BP Readings from Last 1 Encounters:  08/22/18 116/72    Metformin use: Y but not at maximal dosing   Isaias Sakai, Pharm D PGY1 Pharmacy Resident   01/15/2019      11:10 AM

## 2019-07-20 NOTE — Progress Notes (Signed)
CARDIOLOGY OFFICE NOTE  Date:  07/23/2019    Kenneth [name] Date of Birth: February 27, 1928 Medical Record #[id number]  PCP:  Lavone Orn, MD  Cardiologist:  Tamala Julian  Chief Complaint  Patient presents with  . Follow-up    Seen for Dr. Tamala Julian    History of Present Illness: Kenneth [name] is a 85 y.o. male who presents today for a 6 month check. Seen for Dr. Tamala Julian.   He has a history of CAD with remote CABG per EBG, hypertension, hyperlipidemia, hypertension, andpriorhistory of GI bleeding.   Last seen by Dr. Tamala Julian back in February - noted a various assortment of aches and pains but overall felt to have a stable cardiac status. He continues to take care of his wife who has a cognitive impairment - he does all the cooking/cleaning/chores, etc. He likes a very simplified approach to his care and medicines.   The patient does not have symptoms concerning for COVID-19 infection (fever, chills, cough, or new shortness of breath).   Comes in today. Here alone. Lots of various ailments. He is convinced the COVID situation is "all a conspiracy". He does not feel like he can breath with a mask. More bothered by cramps. Has had some vertigo as well. No real chest pain that I can discern. He says "you know I'm old don't you". Labs from June with PCP noted. He notes lots of difficulty with taking care of his wife.   Past Medical History:  Diagnosis Date  . Anemia    on iron  . Anginal pain ([place])    last 1-2 MONTHS ago goes away if stop and rest  . Anxiety   . Arthritis   . Basal cell carcinoma    left ear  . CAD (coronary artery disease)    a. CAD s/p CABG 1993.  [name] Chronic cough   . Chronic rhinitis   . Complication of anesthesia   . Coronary atherosclerosis of unspecified type of vessel, native or graft    CABG 1993, LIMA to LAD, SVG to obtuse marginal one, SVG to posterior descending, stable angina  . Diabetes ([place])   . Difficulty sleeping   . Diverticulosis of colon   .  Dizziness   . Dysrhythmia    occ "skips a beat"  . GERD (gastroesophageal reflux disease)   . Glaucoma   . H/O hiatal hernia   . Hernia    left side  . History of gout   . History of kidney stones   . HTN (hypertension)   . Hyperlipidemia `  . Melanoma of skin, site unspecified   . Numbness    TOES  . PONV (postoperative nausea and vomiting)   . [facility] spotted fever 2-3 yrs ago  . Thyroid disease     Past Surgical History:  Procedure Laterality Date  . COLONOSCOPY WITH PROPOFOL N/A 11/27/2013   Procedure: COLONOSCOPY WITH PROPOFOL;  Surgeon: Garlan Fair, MD;  Location: [facility];  Service: Endoscopy;  Laterality: N/A;  . COLOSTOMY TAKEDOWN N/A 03/16/2016   Procedure: COLOSTOMY TAKEDOWN;  Surgeon: Excell Seltzer, MD;  Location: [facility];  Service: General;  Laterality: N/A;  . CORONARY ARTERY BYPASS GRAFT  1990's   x 3  . HERNIA REPAIR  1970's,17   lft ing hernia  . INGUINAL HERNIA REPAIR Left 06/28/2014   Procedure: Left  INGUINAL HERNIA REPAIR ;  Surgeon: Merrie Roof, MD;  Location: [facility];  Service: General;  Laterality: Left;  .  INSERTION OF MESH Left 06/28/2014   Procedure: INSERTION OF MESH;  Surgeon: Merrie Roof, MD;  Location: [facility];  Service: General;  Laterality: Left;  . INSERTION OF MESH N/A 10/11/2014  . LAPAROSCOPY  10/11/2014  . LAPAROTOMY N/A 09/11/2015   Procedure: EXPLORATORY LAPAROTOMY;  Surgeon: Excell Seltzer, MD;  Location: [facility];  Service: General;  Laterality: N/A;  . MASS EXCISION Left 03/21/2017   Procedure: EXCISION BASAL CELL CARCINOMA LEFT EAR;  Surgeon: Leta Baptist, MD;  Location: [place];  Service: ENT;  Laterality: Left;  . PARTIAL COLECTOMY N/A 09/11/2015   Procedure: SIGMOID COLECTOMY AND COLOSTOMY;  Surgeon: Excell Seltzer, MD;  Location: [facility];  Service: General;  Laterality: N/A;  . right leg fracture age 82    . SKIN FULL THICKNESS GRAFT Left 03/21/2017   Procedure: FULL THICKNESS SKIN GRAFT TO  LEFT EAR;  Surgeon: Leta Baptist, MD;  Location: [place];  Service: ENT;  Laterality: Left;  . TONSILLECTOMY  age 82 or 22  . VENTRAL HERNIA REPAIR  10/11/2014   Procedure: HERNIA REPAIR VENTRAL ADULT;  Surgeon: Autumn Messing III, MD;  Location: [place];  Service: General;;     Medications: Current Meds  Medication Sig  . acetaminophen (TYLENOL) 325 MG tablet Take 325 mg by mouth every 6 (six) hours as needed for mild pain.   [name] amLODipine (NORVASC) 5 MG tablet Take 5 mg by mouth daily.  . Artificial Tear Solution (SOOTHE XP OP) Apply 1 drop to eye daily.  . Cyanocobalamin (VITAMIN B-12 PO) Take 1 tablet by mouth daily.  . ferrous sulfate 325 (65 FE) MG tablet Take 325 mg by mouth daily with breakfast.  . hydrochlorothiazide (HYDRODIURIL) 25 MG tablet Take 12.5 mg by mouth daily.  [name] HYDROcodone-acetaminophen (NORCO/VICODIN) 5-325 MG tablet Take 1 tablet by mouth every 6 (six) hours as needed for moderate pain.  . isosorbide mononitrate (IMDUR) 60 MG 24 hr tablet Take 60 mg by mouth every evening.   . latanoprost (XALATAN) 0.005 % ophthalmic solution Place 1 drop into both eyes at bedtime.  [name] levothyroxine (SYNTHROID, LEVOTHROID) 75 MCG tablet Take 50 mcg by mouth daily before breakfast.   . metFORMIN (GLUCOPHAGE) 1000 MG tablet Take 1,000 mg by mouth daily with breakfast.   . metoprolol succinate (TOPROL-XL) 50 MG 24 hr tablet Take half a tablet (25mg )  daily with or after a meal.  . Multiple Vitamin (MULTIVITAMIN WITH MINERALS) TABS tablet Take 1 tablet by mouth daily.  . pantoprazole (PROTONIX) 40 MG tablet Take 40 mg by mouth daily.  . potassium chloride (K-DUR,KLOR-CON) 10 MEQ tablet Take 1 tablet by mouth daily.  . simvastatin (ZOCOR) 20 MG tablet Take 20 mg by mouth every evening.     Allergies: Allergies  Allergen Reactions  . Lasix [Furosemide] Other (See Comments)    aches  . Spironolactone Other (See Comments)    Red, puffy, eyes, almost hospitalized  . Aspirin Other  (See Comments)    Bleeding.    . Lipitor [Atorvastatin] Other (See Comments)    tired  . Lopressor [Metoprolol Tartrate] Other (See Comments)    Sleepy all the time  . Penicillins Hives and Rash    Has patient had a PCN reaction causing immediate rash, facial/tongue/throat swelling, SOB or lightheadedness with hypotension: No Has patient had a PCN reaction causing severe rash involving mucus membranes or skin necrosis: No Has patient had a PCN reaction that required hospitalization No Has patient had  a PCN reaction occurring within the last 10 years: Yes If all of the above answers are "NO", then may proceed with Cephalosporin use.     Social History: The patient  reports that he has quit smoking. His smoking use included cigars. He has never used smokeless tobacco. He reports current alcohol use. He reports that he does not use drugs.   Family History: The patient's family history includes CAD in his father; Cancer in his mother; Hyperlipidemia in his father.   Review of Systems: Please see the history of present illness.   All other systems are reviewed and negative.   Physical Exam: VS:  BP 130/70   Pulse 84   Ht 5\' 10"  (1.778 m)   Wt 158 lb 12.8 oz (72 kg)   SpO2 97%   BMI 22.79 kg/m  .  BMI Body mass index is 22.79 kg/m.  Wt Readings from Last 3 Encounters:  07/23/19 158 lb 12.8 oz (72 kg)  01/15/19 162 lb 6.4 oz (73.7 kg)  08/22/18 158 lb (71.7 kg)    General: Elderly. Alert and in no acute distress. Pretty sharp.  HEENT: Normal.  Neck: Supple, no JVD, carotid bruits, or masses noted.  Cardiac: Regular rate and rhythm. Outflow murmur noted. No edema.  Respiratory:  Lungs are clear to auscultation bilaterally with normal work of breathing.  GI: Soft and nontender.  MS: No deformity or atrophy. Gait and ROM intact.  Skin: Warm and dry. Color is normal.  Neuro:  Strength and sensation are intact and no gross focal deficits noted.  Psych: Alert, appropriate and with  normal affect.   LABORATORY DATA:  EKG:  EKG is not ordered today.   Lab Results  Component Value Date   WBC 11.1 (H) 02/21/2018   HGB 13.2 02/21/2018   HCT 40.3 02/21/2018   PLT 357 02/21/2018   GLUCOSE 146 (H) 02/21/2018   ALT 14 (L) 09/11/2017   AST 24 09/11/2017   NA 140 02/21/2018   K 4.1 02/21/2018   CL 98 02/21/2018   CREATININE 0.80 02/21/2018   BUN 16 02/21/2018   CO2 24 02/21/2018   INR 0.95 09/09/2015   HGBA1C 6.7 (H) 03/10/2016       BNP (last 3 results) No results for input(s): BNP in the last 8760 hours.  ProBNP (last 3 results) No results for input(s): PROBNP in the last 8760 hours.   Other Studies Reviewed Today:  2D Doppler echocardiogram March 2019: Study Conclusions  - Left ventricle: The cavity size was normal. Systolic function was normal. The estimated ejection fraction was in the range of 60% to 65%. Wall motion was normal; there were no regional wall motion abnormalities. Doppler parameters are consistent with abnormal left ventricular relaxation (grade 1 diastolic dysfunction). Doppler parameters are consistent with elevated ventricular end-diastolic filling pressure. - Aortic valve: Sclerosis without stenosis. Transvalvular velocity was within the normal range. There was no stenosis. There was no regurgitation. - Mitral valve: There was mild regurgitation. - Left atrium: The atrium was normal in size. - Right ventricle: The cavity size was normal. Wall thickness was normal. Systolic function was normal. - Tricuspid valve: There was mild regurgitation. - Pulmonary arteries: Systolic pressure was within the normal range.    Assessment/Plan:  1. CAD - remote CABG - no active chest pain. Would favor conservative management.   2. HLD - on statin - labs from June with PCP noted.   3. HTN - BP is fine - no changes made  today. .  4. DM - per PCP  5. Advanced age - overall holding his own - lots of various  aches and pains - cramps.   6. COVID-19 Education: The signs and symptoms of COVID-19 were discussed with the patient and how to seek care for testing (follow up with PCP or arrange E-visit).  The importance of social distancing, staying at home, hand hygiene and wearing a mask when out in public were discussed today.  Current medicines are reviewed with the patient today.  The patient does not have concerns regarding medicines other than what has been noted above.  The following changes have been made:  See above.  Labs/ tests ordered today include:   No orders of the defined types were placed in this encounter.    Disposition:   FU with [place] in 3 to 4 months. He may need a telephone visit next time - wearing a mask is quite difficult for him.    Patient is agreeable to this plan and will call if any problems develop in the interim.   SignedTruitt Merle, NP  07/23/2019 1:54 PM  [place] [address] [place],   [address] Phone: [phone] Fax: [phone]

## 2019-07-23 ENCOUNTER — Other Ambulatory Visit: Payer: Self-pay

## 2019-07-23 ENCOUNTER — Encounter: Payer: Self-pay | Admitting: Nurse Practitioner

## 2019-07-23 ENCOUNTER — Ambulatory Visit (INDEPENDENT_AMBULATORY_CARE_PROVIDER_SITE_OTHER): Payer: Medicare Other | Admitting: Nurse Practitioner

## 2019-07-23 VITALS — BP 130/70 | HR 84 | Ht 70.0 in | Wt 158.8 lb

## 2019-07-23 DIAGNOSIS — Z7189 Other specified counseling: Secondary | ICD-10-CM

## 2019-07-23 DIAGNOSIS — R0602 Shortness of breath: Secondary | ICD-10-CM

## 2019-07-23 DIAGNOSIS — R42 Dizziness and giddiness: Secondary | ICD-10-CM

## 2019-07-23 DIAGNOSIS — I5032 Chronic diastolic (congestive) heart failure: Secondary | ICD-10-CM | POA: Diagnosis not present

## 2019-07-23 DIAGNOSIS — I25709 Atherosclerosis of coronary artery bypass graft(s), unspecified, with unspecified angina pectoris: Secondary | ICD-10-CM | POA: Diagnosis not present

## 2019-07-23 NOTE — Patient Instructions (Addendum)
After Visit Summary:  We will be checking the following labs today - NONE   Medication Instructions:    Continue with your current medicines.    If you need a refill on your cardiac medications before your next appointment, please call your pharmacy.     Testing/Procedures To Be Arranged:  N/A  Follow-Up:   See Dr. Tamala Julian in about 3 to 4 months - we can do this over the phone if needed.     At [facility], you and your health needs are our priority.  As part of our continuing mission to provide you with exceptional heart care, we have created designated Provider Care Teams.  These Care Teams include your primary Cardiologist (physician) and Advanced Practice Providers (APPs -  Physician Assistants and Nurse Practitioners) who all work together to provide you with the care you need, when you need it.  Special Instructions:  . Stay safe, stay home, wash your hands for at least 20 seconds and wear a mask when out in public.  . It was good to talk with you today.  . You can get some diet tonic water and take a few sips - this has quinine and may help your cramps.    Call the [place] office at [phone] if you have any questions, problems or concerns.

## 2019-10-27 ENCOUNTER — Other Ambulatory Visit: Payer: Self-pay | Admitting: Interventional Cardiology

## 2019-11-02 ENCOUNTER — Other Ambulatory Visit: Payer: Self-pay

## 2019-11-02 ENCOUNTER — Emergency Department (HOSPITAL_COMMUNITY): Payer: Medicare Other

## 2019-11-02 ENCOUNTER — Encounter (HOSPITAL_COMMUNITY): Payer: Self-pay | Admitting: Emergency Medicine

## 2019-11-02 ENCOUNTER — Inpatient Hospital Stay (HOSPITAL_COMMUNITY)
Admission: EM | Admit: 2019-11-02 | Discharge: 2019-11-04 | DRG: 392 | Disposition: A | Discharge status: Home / Self Care | Payer: Medicare Other | Attending: Internal Medicine | Admitting: Internal Medicine

## 2019-11-02 DIAGNOSIS — E118 Type 2 diabetes mellitus with unspecified complications: Secondary | ICD-10-CM | POA: Diagnosis present

## 2019-11-02 DIAGNOSIS — Z79890 Hormone replacement therapy (postmenopausal): Secondary | ICD-10-CM

## 2019-11-02 DIAGNOSIS — Z888 Allergy status to other drugs, medicaments and biological substances status: Secondary | ICD-10-CM

## 2019-11-02 DIAGNOSIS — Z66 Do not resuscitate: Secondary | ICD-10-CM | POA: Diagnosis present

## 2019-11-02 DIAGNOSIS — Z7984 Long term (current) use of oral hypoglycemic drugs: Secondary | ICD-10-CM

## 2019-11-02 DIAGNOSIS — E785 Hyperlipidemia, unspecified: Secondary | ICD-10-CM | POA: Diagnosis present

## 2019-11-02 DIAGNOSIS — K5732 Diverticulitis of large intestine without perforation or abscess without bleeding: Secondary | ICD-10-CM | POA: Diagnosis not present

## 2019-11-02 DIAGNOSIS — H409 Unspecified glaucoma: Secondary | ICD-10-CM | POA: Diagnosis present

## 2019-11-02 DIAGNOSIS — Z886 Allergy status to analgesic agent status: Secondary | ICD-10-CM

## 2019-11-02 DIAGNOSIS — K5792 Diverticulitis of intestine, part unspecified, without perforation or abscess without bleeding: Secondary | ICD-10-CM | POA: Diagnosis not present

## 2019-11-02 DIAGNOSIS — I10 Essential (primary) hypertension: Secondary | ICD-10-CM | POA: Diagnosis present

## 2019-11-02 DIAGNOSIS — M1990 Unspecified osteoarthritis, unspecified site: Secondary | ICD-10-CM | POA: Diagnosis present

## 2019-11-02 DIAGNOSIS — T3695XA Adverse effect of unspecified systemic antibiotic, initial encounter: Secondary | ICD-10-CM | POA: Diagnosis present

## 2019-11-02 DIAGNOSIS — E860 Dehydration: Secondary | ICD-10-CM | POA: Diagnosis present

## 2019-11-02 DIAGNOSIS — Z20828 Contact with and (suspected) exposure to other viral communicable diseases: Secondary | ICD-10-CM | POA: Diagnosis present

## 2019-11-02 DIAGNOSIS — I25810 Atherosclerosis of coronary artery bypass graft(s) without angina pectoris: Secondary | ICD-10-CM | POA: Diagnosis present

## 2019-11-02 DIAGNOSIS — Z8349 Family history of other endocrine, nutritional and metabolic diseases: Secondary | ICD-10-CM

## 2019-11-02 DIAGNOSIS — K219 Gastro-esophageal reflux disease without esophagitis: Secondary | ICD-10-CM | POA: Diagnosis present

## 2019-11-02 DIAGNOSIS — M109 Gout, unspecified: Secondary | ICD-10-CM | POA: Diagnosis present

## 2019-11-02 DIAGNOSIS — Y929 Unspecified place or not applicable: Secondary | ICD-10-CM

## 2019-11-02 DIAGNOSIS — E876 Hypokalemia: Secondary | ICD-10-CM | POA: Diagnosis present

## 2019-11-02 DIAGNOSIS — F419 Anxiety disorder, unspecified: Secondary | ICD-10-CM | POA: Diagnosis present

## 2019-11-02 DIAGNOSIS — Z8249 Family history of ischemic heart disease and other diseases of the circulatory system: Secondary | ICD-10-CM

## 2019-11-02 DIAGNOSIS — R110 Nausea: Secondary | ICD-10-CM | POA: Diagnosis present

## 2019-11-02 DIAGNOSIS — I2510 Atherosclerotic heart disease of native coronary artery without angina pectoris: Secondary | ICD-10-CM | POA: Diagnosis present

## 2019-11-02 DIAGNOSIS — E119 Type 2 diabetes mellitus without complications: Secondary | ICD-10-CM | POA: Diagnosis present

## 2019-11-02 DIAGNOSIS — Z951 Presence of aortocoronary bypass graft: Secondary | ICD-10-CM

## 2019-11-02 DIAGNOSIS — Z85820 Personal history of malignant melanoma of skin: Secondary | ICD-10-CM

## 2019-11-02 DIAGNOSIS — Z87442 Personal history of urinary calculi: Secondary | ICD-10-CM

## 2019-11-02 DIAGNOSIS — Z880 Allergy status to penicillin: Secondary | ICD-10-CM

## 2019-11-02 DIAGNOSIS — D638 Anemia in other chronic diseases classified elsewhere: Secondary | ICD-10-CM | POA: Diagnosis present

## 2019-11-02 DIAGNOSIS — E039 Hypothyroidism, unspecified: Secondary | ICD-10-CM | POA: Diagnosis present

## 2019-11-02 DIAGNOSIS — Z79899 Other long term (current) drug therapy: Secondary | ICD-10-CM

## 2019-11-02 LAB — CBC WITH DIFFERENTIAL/PLATELET
Abs Immature Granulocytes: 0.02 10*3/uL (ref 0.00–0.07)
Basophils Absolute: 0.1 10*3/uL (ref 0.0–0.1)
Basophils Relative: 1 %
Eosinophils Absolute: 0 10*3/uL (ref 0.0–0.5)
Eosinophils Relative: 0 %
HCT: 45.3 % (ref 39.0–52.0)
Hemoglobin: 14.7 g/dL (ref 13.0–17.0)
Immature Granulocytes: 0 %
Lymphocytes Relative: 10 %
Lymphs Abs: 0.7 10*3/uL (ref 0.7–4.0)
MCH: 31.8 pg (ref 26.0–34.0)
MCHC: 32.5 g/dL (ref 30.0–36.0)
MCV: 98.1 fL (ref 80.0–100.0)
Monocytes Absolute: 0.6 10*3/uL (ref 0.1–1.0)
Monocytes Relative: 8 %
Neutro Abs: 5.8 10*3/uL (ref 1.7–7.7)
Neutrophils Relative %: 81 %
Platelets: 346 10*3/uL (ref 150–400)
RBC: 4.62 MIL/uL (ref 4.22–5.81)
RDW: 13.2 % (ref 11.5–15.5)
WBC: 7.2 10*3/uL (ref 4.0–10.5)
nRBC: 0 % (ref 0.0–0.2)

## 2019-11-02 LAB — URINALYSIS, ROUTINE W REFLEX MICROSCOPIC
Bacteria, UA: NONE SEEN
Bilirubin Urine: NEGATIVE
Glucose, UA: NEGATIVE mg/dL
Hgb urine dipstick: NEGATIVE
Ketones, ur: 20 mg/dL — AB
Leukocytes,Ua: NEGATIVE
Nitrite: NEGATIVE
Protein, ur: 30 mg/dL — AB
Specific Gravity, Urine: 1.025 (ref 1.005–1.030)
pH: 5 (ref 5.0–8.0)

## 2019-11-02 LAB — COMPREHENSIVE METABOLIC PANEL
ALT: 11 U/L (ref 0–44)
AST: 21 U/L (ref 15–41)
Albumin: 3.7 g/dL (ref 3.5–5.0)
Alkaline Phosphatase: 113 U/L (ref 38–126)
Anion gap: 17 — ABNORMAL HIGH (ref 5–15)
BUN: 18 mg/dL (ref 8–23)
CO2: 18 mmol/L — ABNORMAL LOW (ref 22–32)
Calcium: 9.7 mg/dL (ref 8.9–10.3)
Chloride: 105 mmol/L (ref 98–111)
Creatinine, Ser: 1.06 mg/dL (ref 0.61–1.24)
GFR calc Af Amer: >60 mL/min (ref >60)
GFR calc non Af Amer: >60 mL/min (ref >60)
Glucose, Bld: 172 mg/dL — ABNORMAL HIGH (ref 70–99)
Potassium: 3.9 mmol/L (ref 3.5–5.1)
Sodium: 140 mmol/L (ref 135–145)
Total Bilirubin: 1.1 mg/dL (ref 0.3–1.2)
Total Protein: 7.8 g/dL (ref 6.5–8.1)

## 2019-11-02 LAB — TSH: TSH: 2.833 u[IU]/mL (ref 0.350–4.500)

## 2019-11-02 LAB — GLUCOSE, CAPILLARY
Glucose-Capillary: 120 mg/dL — ABNORMAL HIGH (ref 70–99)
Glucose-Capillary: 137 mg/dL — ABNORMAL HIGH (ref 70–99)

## 2019-11-02 LAB — SARS CORONAVIRUS 2 (TAT 6-24 HRS): SARS Coronavirus 2: NEGATIVE

## 2019-11-02 LAB — LIPASE, BLOOD: Lipase: 19 U/L (ref 11–51)

## 2019-11-02 MED ORDER — SODIUM CHLORIDE 0.9 % IV SOLN
INTRAVENOUS | Status: DC
  Administered 2019-11-02 – 2019-11-04 (×4): via INTRAVENOUS

## 2019-11-02 MED ORDER — AMLODIPINE BESYLATE 5 MG PO TABS
5.0000 mg | ORAL_TABLET | Freq: Every day | ORAL | Status: DC
  Administered 2019-11-03 – 2019-11-04 (×2): 5 mg via ORAL
  Filled 2019-11-02 (×2): qty 1

## 2019-11-02 MED ORDER — IOHEXOL 300 MG/ML  SOLN
100.0000 mL | Freq: Once | INTRAMUSCULAR | Status: AC | PRN
  Administered 2019-11-02: 100 mL via INTRAVENOUS

## 2019-11-02 MED ORDER — MORPHINE SULFATE (PF) 2 MG/ML IV SOLN: 2.0000 mg | INTRAVENOUS | Status: DC | PRN

## 2019-11-02 MED ORDER — ONDANSETRON HCL 4 MG PO TABS: 4.0000 mg | ORAL_TABLET | Freq: Four times a day (QID) | ORAL | Status: DC | PRN

## 2019-11-02 MED ORDER — LATANOPROST 0.005 % OP SOLN
1.0000 [drp] | Freq: Every day | OPHTHALMIC | Status: DC
  Administered 2019-11-02 – 2019-11-03 (×2): 1 [drp] via OPHTHALMIC
  Filled 2019-11-02: qty 2.5

## 2019-11-02 MED ORDER — ENOXAPARIN SODIUM 40 MG/0.4ML ~~LOC~~ SOLN
40.0000 mg | SUBCUTANEOUS | Status: DC
  Administered 2019-11-02 – 2019-11-03 (×2): 40 mg via SUBCUTANEOUS
  Filled 2019-11-02 (×2): qty 0.4

## 2019-11-02 MED ORDER — CIPROFLOXACIN IN D5W 400 MG/200ML IV SOLN
400.0000 mg | Freq: Once | INTRAVENOUS | Status: AC
  Administered 2019-11-02: 400 mg via INTRAVENOUS
  Filled 2019-11-02: qty 200

## 2019-11-02 MED ORDER — TRAZODONE HCL 50 MG PO TABS
50.0000 mg | ORAL_TABLET | Freq: Once | ORAL | Status: AC
  Administered 2019-11-02: 50 mg via ORAL
  Filled 2019-11-02: qty 1

## 2019-11-02 MED ORDER — METRONIDAZOLE IN NACL 5-0.79 MG/ML-% IV SOLN
500.0000 mg | Freq: Three times a day (TID) | INTRAVENOUS | Status: DC
  Administered 2019-11-02 – 2019-11-03 (×3): 500 mg via INTRAVENOUS
  Filled 2019-11-02 (×3): qty 100

## 2019-11-02 MED ORDER — INSULIN ASPART 100 UNIT/ML ~~LOC~~ SOLN
0.0000 [IU] | Freq: Three times a day (TID) | SUBCUTANEOUS | Status: DC
  Administered 2019-11-03: 2 [IU] via SUBCUTANEOUS
  Administered 2019-11-04: 1 [IU] via SUBCUTANEOUS

## 2019-11-02 MED ORDER — ONDANSETRON HCL 4 MG/2ML IJ SOLN: 4.0000 mg | Freq: Once | INTRAMUSCULAR | Status: DC

## 2019-11-02 MED ORDER — SODIUM CHLORIDE 0.9 % IV SOLN
INTRAVENOUS | Status: DC
  Administered 2019-11-02: 09:00:00 via INTRAVENOUS

## 2019-11-02 MED ORDER — ACETAMINOPHEN 325 MG PO TABS
650.0000 mg | ORAL_TABLET | Freq: Four times a day (QID) | ORAL | Status: DC | PRN
  Administered 2019-11-02 – 2019-11-04 (×4): 650 mg via ORAL
  Filled 2019-11-02 (×4): qty 2

## 2019-11-02 MED ORDER — LEVOTHYROXINE SODIUM 50 MCG PO TABS
50.0000 ug | ORAL_TABLET | Freq: Every day | ORAL | Status: DC
  Administered 2019-11-03 – 2019-11-04 (×2): 50 ug via ORAL
  Filled 2019-11-02 (×2): qty 1

## 2019-11-02 MED ORDER — ONDANSETRON HCL 4 MG/2ML IJ SOLN
4.0000 mg | Freq: Four times a day (QID) | INTRAMUSCULAR | Status: DC | PRN
  Administered 2019-11-02 – 2019-11-04 (×3): 4 mg via INTRAVENOUS
  Filled 2019-11-02 (×3): qty 2

## 2019-11-02 MED ORDER — CIPROFLOXACIN IN D5W 400 MG/200ML IV SOLN
400.0000 mg | Freq: Two times a day (BID) | INTRAVENOUS | Status: DC
  Administered 2019-11-03 (×2): 400 mg via INTRAVENOUS
  Filled 2019-11-02 (×2): qty 200

## 2019-11-02 MED ORDER — SODIUM CHLORIDE 0.9 % IV BOLUS
500.0000 mL | Freq: Once | INTRAVENOUS | Status: AC
  Administered 2019-11-02: 500 mL via INTRAVENOUS

## 2019-11-02 MED ORDER — METOPROLOL SUCCINATE ER 25 MG PO TB24
25.0000 mg | ORAL_TABLET | Freq: Every day | ORAL | Status: DC
  Administered 2019-11-02 – 2019-11-04 (×3): 25 mg via ORAL
  Filled 2019-11-02 (×2): qty 1

## 2019-11-02 MED ORDER — ACETAMINOPHEN 650 MG RE SUPP: 650.0000 mg | Freq: Four times a day (QID) | RECTAL | Status: DC | PRN

## 2019-11-02 MED ORDER — ISOSORBIDE MONONITRATE ER 30 MG PO TB24
60.0000 mg | ORAL_TABLET | Freq: Every evening | ORAL | Status: DC
  Administered 2019-11-02 – 2019-11-03 (×2): 60 mg via ORAL
  Filled 2019-11-02 (×2): qty 2

## 2019-11-02 MED ORDER — METRONIDAZOLE IN NACL 5-0.79 MG/ML-% IV SOLN: 500.0000 mg | Freq: Once | INTRAVENOUS | Status: DC

## 2019-11-02 MED ORDER — SIMVASTATIN 20 MG PO TABS
20.0000 mg | ORAL_TABLET | Freq: Every evening | ORAL | Status: DC
  Administered 2019-11-02 – 2019-11-03 (×2): 20 mg via ORAL
  Filled 2019-11-02 (×2): qty 1

## 2019-11-02 NOTE — H&P (Signed)
History and Physical    Kenneth [name] [id number] DOB: January 12, 1928 DOA: 11/02/2019  PCP: Lavone Orn, MD Consultants:  Tamala Julian - cardiology Patient coming from:  Home - lives with wife; NOK: Wife, Enid Derry [phone]; Daughter has Ida, [phone] Daleen Snook)  Chief Complaint: LLQ pain  HPI: Kenneth [name] is a 85 y.o. male with medical history significant of hypothyroidism; HTN; HLD; DM; and CAD s/p CABG 1993 presenting with LLQ pain.  He reports a colon infection.  He noticed symptoms about 2 weeks ago.  He has had pain, can't eat, nasal discharge.  He eats and gets sick, it has gotten worse.  He has not eaten since Monday.  He had colectomy about 2-3 years ago.  Her has other problems and has not taken his medications since Monday.  Gagging since Monday, no real emesis.  His last BM was about an hour ago, little amount.    I spoke with her daughter and explained the visitation policy as well as about the patient's care.   ED Course:  Diverticulitis - vomiting for a few days.  H/o colectomy for diverticulitis in the past.  Given Cipro/Flagyl.  Review of Systems: As per HPI; otherwise review of systems reviewed and negative.   Ambulatory Status:  Ambulates without assistance  Past Medical History:  Diagnosis Date   Anemia    on iron   Anxiety    Arthritis    Basal cell carcinoma    left ear   CAD (coronary artery disease)    a. CAD s/p CABG 1993.   Chronic cough    Chronic rhinitis    Complication of anesthesia    Diabetes ([place])    Diverticulosis of colon    GERD (gastroesophageal reflux disease)    Glaucoma    H/O hiatal hernia    Hernia    left side   History of gout    History of kidney stones    HTN (hypertension)    Hyperlipidemia `   Melanoma of skin, site unspecified    PONV (postoperative nausea and vomiting)    [facility] spotted fever 2-3 yrs ago   Thyroid disease     Past Surgical History:  Procedure Laterality Date    COLONOSCOPY WITH PROPOFOL N/A 11/27/2013   Procedure: COLONOSCOPY WITH PROPOFOL;  Surgeon: Garlan Fair, MD;  Location: [facility];  Service: Endoscopy;  Laterality: N/A;   COLOSTOMY TAKEDOWN N/A 03/16/2016   Procedure: COLOSTOMY TAKEDOWN;  Surgeon: Excell Seltzer, MD;  Location: [facility];  Service: General;  Laterality: N/A;   CORONARY ARTERY BYPASS GRAFT  1990's   x 3   HERNIA REPAIR  1970's,15   lft ing hernia   INGUINAL HERNIA REPAIR Left 06/28/2014   Procedure: Left  INGUINAL HERNIA REPAIR ;  Surgeon: Merrie Roof, MD;  Location: [facility];  Service: General;  Laterality: Left;   INSERTION OF MESH Left 06/28/2014   Procedure: INSERTION OF MESH;  Surgeon: Merrie Roof, MD;  Location: [facility];  Service: General;  Laterality: Left;   INSERTION OF MESH N/A 10/11/2014   LAPAROSCOPY  10/11/2014   LAPAROTOMY N/A 09/11/2015   Procedure: EXPLORATORY LAPAROTOMY;  Surgeon: Excell Seltzer, MD;  Location: [facility];  Service: General;  Laterality: N/A;   MASS EXCISION Left 03/21/2017   Procedure: EXCISION BASAL CELL CARCINOMA LEFT EAR;  Surgeon: Leta Baptist, MD;  Location: [place];  Service: ENT;  Laterality: Left;   PARTIAL COLECTOMY N/A 09/11/2015   Procedure:  SIGMOID COLECTOMY AND COLOSTOMY;  Surgeon: Excell Seltzer, MD;  Location: [facility];  Service: General;  Laterality: N/A;   right leg fracture age 67     SKIN FULL THICKNESS GRAFT Left 03/21/2017   Procedure: FULL THICKNESS SKIN GRAFT TO LEFT EAR;  Surgeon: Leta Baptist, MD;  Location: [place];  Service: ENT;  Laterality: Left;   TONSILLECTOMY  age 71 or [place]  10/11/2014   Procedure: HERNIA REPAIR VENTRAL ADULT;  Surgeon: Autumn Messing III, MD;  Location: [place];  Service: General;;    Social History   Socioeconomic History   Marital status: Married    Spouse name: Not on file   Number of children: Not on file   Years of education: Not on file   Highest education level:  Not on file  Occupational History   Not on file  Social Needs   Financial resource strain: Not on file   Food insecurity    Worry: Not on file    Inability: Not on file   Transportation needs    Medical: Not on file    Non-medical: Not on file  Tobacco Use   Smoking status: Former Smoker    Types: Cigars   Smokeless tobacco: Never Used  Substance and Sexual Activity   Alcohol use: Yes    Comment: RARE   Drug use: No    Comment: occ cigars   Sexual activity: Not on file  Lifestyle   Physical activity    Days per week: Not on file    Minutes per session: Not on file   Stress: Not on file  Relationships   Social connections    Talks on phone: Not on file    Gets together: Not on file    Attends religious service: Not on file    Active member of club or organization: Not on file    Attends meetings of clubs or organizations: Not on file    Relationship status: Not on file   Intimate partner violence    Fear of current or ex partner: Not on file    Emotionally abused: Not on file    Physically abused: Not on file    Forced sexual activity: Not on file  Other Topics Concern   Not on file  Social History Narrative   Not on file    Allergies  Allergen Reactions   Lasix [Furosemide] Other (See Comments)    aches   Spironolactone Other (See Comments)    Red, puffy, eyes, almost hospitalized   Aspirin Other (See Comments)    Bleeding.     Lipitor [Atorvastatin] Other (See Comments)    tired   Lopressor [Metoprolol Tartrate] Other (See Comments)    Sleepy all the time   Penicillins Hives and Rash    Has patient had a PCN reaction causing immediate rash, facial/tongue/throat swelling, SOB or lightheadedness with hypotension: No Has patient had a PCN reaction causing severe rash involving mucus membranes or skin necrosis: No Has patient had a PCN reaction that required hospitalization No Has patient had a PCN reaction occurring within the last 10  years: Yes If all of the above answers are "NO", then may proceed with Cephalosporin use.     Family History  Problem Relation Age of Onset   CAD Father    Hyperlipidemia Father    Cancer Mother     Prior to Admission medications   Medication Sig Start Date End Date Taking?  Authorizing Provider  acetaminophen (TYLENOL) 325 MG tablet Take 325 mg by mouth every 6 (six) hours as needed for mild pain.     [provider]  amLODipine (NORVASC) 5 MG tablet Take 5 mg by mouth daily. 01/17/18   [provider]  Artificial Tear Solution (SOOTHE XP OP) Apply 1 drop to eye daily.    [provider]  Cyanocobalamin (VITAMIN B-12 PO) Take 1 tablet by mouth daily.    [provider]  ferrous sulfate 325 (65 FE) MG tablet Take 325 mg by mouth daily with breakfast.    [provider]  hydrochlorothiazide (HYDRODIURIL) 25 MG tablet Take 12.5 mg by mouth daily.    [provider]  HYDROcodone-acetaminophen (NORCO/VICODIN) 5-325 MG tablet Take 1 tablet by mouth every 6 (six) hours as needed for moderate pain.    [provider]  isosorbide mononitrate (IMDUR) 60 MG 24 hr tablet Take 60 mg by mouth every evening.     [provider]  latanoprost (XALATAN) 0.005 % ophthalmic solution Place 1 drop into both eyes at bedtime.    [provider]  levothyroxine (SYNTHROID, LEVOTHROID) 75 MCG tablet Take 50 mcg by mouth daily before breakfast.     [provider]  metFORMIN (GLUCOPHAGE) 1000 MG tablet Take 1,000 mg by mouth daily with breakfast.     [provider]  metoprolol succinate (TOPROL-XL) 50 MG 24 hr tablet TAKE ONE-HALF (1/2) TABLET BY MOUTH DAILY WITH OR AFTER A MEAL 10/29/19   Belva Crome, MD  Multiple Vitamin (MULTIVITAMIN WITH MINERALS) TABS tablet Take 1 tablet by mouth daily.    [provider]  pantoprazole (PROTONIX) 40 MG tablet Take 40 mg by mouth daily.    [provider]    potassium chloride (K-DUR,KLOR-CON) 10 MEQ tablet Take 1 tablet by mouth daily. 06/27/18   [provider]  simvastatin (ZOCOR) 20 MG tablet Take 20 mg by mouth every evening.    [provider]    Physical Exam: Vitals:   11/02/19 1330 11/02/19 1345 11/02/19 1415 11/02/19 1430  BP: 139/64 127/83 (!) 134/93 140/76  Pulse: 92 93  92  Resp: 19 16 20 17   Temp:      TempSrc:      SpO2: 98% 97%  97%  Weight:          General:  Appears calm and comfortable and is NAD  Eyes:  PERRL, EOMI, normal lids, iris  ENT:  grossly normal hearing, lips & tongue, mmm  Neck:  no LAD, masses or thyromegaly  Cardiovascular:  RRR, no m/r/g. No LE edema.   Respiratory:   CTA bilaterally with no wheezes/rales/rhonchi.  Normal respiratory effort.  Abdomen:  soft, NT, ND, NABS  Skin:  no rash or induration seen on limited exam  Musculoskeletal:  grossly normal tone BUE/BLE, good ROM, no bony abnormality  Psychiatric:  grossly normal mood and affect, speech fluent and appropriate, AOx3  Neurologic:  CN 2-12 grossly intact, moves all extremities in coordinated fashion, sensation intact    Radiological Exams on Admission: Ct Abdomen Pelvis W Contrast  Result Date: 11/02/2019 CLINICAL DATA:  Abdominal distension. EXAM: CT ABDOMEN AND PELVIS WITH CONTRAST TECHNIQUE: Multidetector CT imaging of the abdomen and pelvis was performed using the standard protocol following bolus administration of intravenous contrast. CONTRAST:  132mL OMNIPAQUE IOHEXOL 300 MG/ML  SOLN COMPARISON:  09/10/2017 FINDINGS: Lower chest: Large hiatal hernia similar to prior study. Signs of mitral annular calcification and coronary  artery disease. No signs of consolidation or evidence of pleural effusion. Hepatobiliary: Signs of hepatic cysts. No suspicious focal hepatic lesion. Gallbladder mildly distended without signs of pericholecystic fluid. No signs of biliary ductal dilation. Pancreas: Scattered calcific  changes of the pancreas similar to prior study. Subtle 9 mm low-attenuation focus in the pancreas appears cystic, potentially related to prior pancreatitis given the presence of calcifications. Spleen: Normal in size without focal abnormality. Adrenals/Urinary Tract: Mild thickening of the adrenal glands. Nonobstructing calculus lower pole right kidney measuring 8 mm. No signs of hydronephrosis. No signs of ureteral calculus on today's exam. No suspicious renal lesion. Stomach/Bowel: Organo-axial gastric volvulus without adjacent gastric stranding, stomach is incompletely imaged. Configuration unchanged since prior exam. Transverse segmental colonic thickening with signs of diverticulosis. Signs of left hemicolectomy. No signs of pneumatosis. Normal appendix. Vascular/Lymphatic: Calcified atherosclerosis of the abdominal aorta. No signs of aneurysm. No signs of adenopathy. Reproductive: Prostate unremarkable by CT. Other: No free air or abscess. Musculoskeletal: No sign of acute or destructive bone process. IMPRESSION: 1. Segmental colitis in the setting of diverticulosis in the transverse colon, likely diverticulitis. Period colonic thickening is noted with adjacent small lymph nodes but no adenopathy. CT follow-up may be helpful to ensure resolution of changes and determine whether direct visualization may be warranted given changes of colonic thickening. 2. Signs of left hemicolectomy. 3. Stable large hiatal hernia. 4. Nonobstructing right lower pole renal calculus. 5. Signs of chronic calcific pancreatitis. Small cystic focus suggested in the midportion of the pancreas. CT follow-up could be utilized for this finding as well when assessing colonic findings. 6. Aortic atherosclerosis. 7. Signs of mitral annular calcification and coronary artery disease. Aortic Atherosclerosis (ICD10-I70.0). Electronically Signed   By: Zetta Bills M.D.   On: 11/02/2019 12:26    EKG: Independently reviewed.  NSR with rate 98;  RBBB; nonspecific ST changes with no evidence of acute ischemia   Labs on Admission: I have personally reviewed the available labs and imaging studies at the time of the admission.  Pertinent labs:   CO2 18 Glucose 172 Anion gap 17 Normal CBC UA: 20 ketones, 30 protein   Assessment/Plan Principal Problem:   Diverticulitis Active Problems:   HTN (hypertension)   Coronary artery disease involving coronary bypass graft of native heart without angina pectoris   Hyperlipidemia with target LDL less than 70   Type 2 diabetes mellitus with complication, without long-term current use of insulin (HCC)   Diverticulitis -Patient's symptoms are c/w diverticulitis and his CT supports this as a diagnosis; he has prior significant disease requiring colectomy in 2018 -His only SIRS criteria is tachycardia and this may be related to rebound in the setting of inability to tolerate PO medications all week (including Toprol XL) -For now, will give clear liquids with advancement as tolerated, IVF (mild dehydration with ketonuria and anion gap without AKI), pain medication with morphine, nausea medication with Zofran, and treat with Cipro/Flagyl for intraabdominal infection -If not improving, he may need GI/surgery consultation -If ongoing stabilization, he may be appropriate for d/c to home tomorrow (11/28)  CAD -Continue Imdur -He does not appear to be taking ASA; this may need outpatient consideration  HTN -Continue Norvasc, resume tomorrow -Continue Toprol, resume today -Hold HCTZ  HLD -Continue Zocor  Hypothyroidism -Check TSH -Continue Synthroid at current dose for now  DM -Last A1c (remote) was 6.7, likely no ongoing indication to check -hold Glucophage -Cover with sensitive-scale SSI    Note: This patient has been tested  and is pending for the novel coronavirus COVID-19.  DVT prophylaxis:  Lovenox  Code Status:  DNR - confirmed with patient Family Communication: Wife present  during evaluation; wife clearly has dementia (significant difficulty understanding the current visitation policy despite repeated instruction by multiple people); I also spoke with his daughter by telephone Disposition Plan:  Home once clinically improved Consults called: None  Admission status: It is my clinical opinion that referral for OBSERVATION is reasonable and necessary in this patient based on the above information provided. The aforementioned taken together are felt to place the patient at high risk for further clinical deterioration. However it is anticipated that the patient may be medically stable for discharge from the hospital within 24 to 48 hours.    Karmen Bongo MD [facility]   How to contact the [facility] Attending or Consulting provider [place] or covering provider during after hours [place], for this patient?  1. Check the care team in [facility] and look for a) attending/consulting [facility] provider listed and b) the [facility] team listed 2. Log into [email or web address] and use [place]'s universal password to access. If you do not have the password, please contact the hospital operator. 3. Locate the [facility] provider you are looking for under [facility] and page to a number that you can be directly reached. 4. If you still have difficulty reaching the provider, please page the [facility] (Director on Call) for the Hospitalists listed on amion for assistance.   11/02/2019, 2:42 PM

## 2019-11-02 NOTE — ED Notes (Signed)
Daleen Snook (daughter) # [phone]

## 2019-11-02 NOTE — Plan of Care (Signed)
  Problem: Education: Goal: Knowledge of General Education information will improve Description Including pain rating scale, medication(s)/side effects and non-pharmacologic comfort measures Outcome: Progressing   

## 2019-11-02 NOTE — ED Triage Notes (Signed)
Pt from home w/complaints of LLQ/low generalized abd pain x 3-4 days. C/o some n/v and frequent diarrhea - denies any fevers, and feels fatigued. Pt reports no intake but icecream 3 days ago. C/o mild sob with exertion

## 2019-11-02 NOTE — ED Notes (Signed)
Patient transported to CT 

## 2019-11-02 NOTE — ED Provider Notes (Signed)
[facility] EMERGENCY DEPARTMENT Provider Note   CSN: [id number] Arrival date & time: 11/02/19  [id number]     History   Chief Complaint Chief Complaint  Patient presents with  . Abdominal Pain  . Fatigue    HPI Kenneth [name] is a 85 y.o. male.     85 year old male with history of bowel obstruction as well as colon surgery presents with 3 to 4 days of nonbilious emesis and diffuse abdominal discomfort.  Denies any fever.  No diarrhea.  Notes abdominal distention.  States that these current symptoms are similar to when he had bowel obstruction in the past.  Denies any urinary symptoms.  No treatment use prior to arrival.     Past Medical History:  Diagnosis Date  . Anemia    on iron  . Anginal pain ([place])    last 1-2 MONTHS ago goes away if stop and rest  . Anxiety   . Arthritis   . Basal cell carcinoma    left ear  . CAD (coronary artery disease)    a. CAD s/p CABG 1993.  [name] Chronic cough   . Chronic rhinitis   . Complication of anesthesia   . Coronary atherosclerosis of unspecified type of vessel, native or graft    CABG 1993, LIMA to LAD, SVG to obtuse marginal one, SVG to posterior descending, stable angina  . Diabetes ([place])   . Difficulty sleeping   . Diverticulosis of colon   . Dizziness   . Dysrhythmia    occ "skips a beat"  . GERD (gastroesophageal reflux disease)   . Glaucoma   . H/O hiatal hernia   . Hernia    left side  . History of gout   . History of kidney stones   . HTN (hypertension)   . Hyperlipidemia `  . Melanoma of skin, site unspecified   . Numbness    TOES  . PONV (postoperative nausea and vomiting)   . [facility] spotted fever 2-3 yrs ago  . Thyroid disease     Patient Active Problem List   Diagnosis Date Noted  . Colostomy status ([place]) 03/16/2016  . Elevated troponin 09/13/2015  . Hemoptysis   . Nausea and vomiting 09/09/2015  . Small bowel obstruction ([place]) 09/09/2015  . Hypertensive urgency 09/09/2015   . Type 2 diabetes mellitus with complication, without long-term current use of insulin ([place]) 09/09/2015  . Colonic mass   . Intestinal obstruction ([place])   . Preop cardiovascular exam 06/27/2014  . Left inguinal hernia 06/26/2014  . Dysphagia 09/28/2013    Class: Chronic  . Abnormal weight loss 09/28/2013    Class: Chronic  . HTN (hypertension)   . Coronary artery disease involving coronary bypass graft of native heart with angina pectoris ([place])   . Hyperlipidemia with target LDL less than 70   . GERD (gastroesophageal reflux disease)   . Nephrolithiasis   . Melanoma of skin, site unspecified   . Glaucoma     Past Surgical History:  Procedure Laterality Date  . COLONOSCOPY WITH PROPOFOL N/A 11/27/2013   Procedure: COLONOSCOPY WITH PROPOFOL;  Surgeon: Garlan Fair, MD;  Location: [facility];  Service: Endoscopy;  Laterality: N/A;  . COLOSTOMY TAKEDOWN N/A 03/16/2016   Procedure: COLOSTOMY TAKEDOWN;  Surgeon: Excell Seltzer, MD;  Location: [facility];  Service: General;  Laterality: N/A;  . CORONARY ARTERY BYPASS GRAFT  1990's   x 3  . HERNIA REPAIR  1970's,17   lft ing hernia  .  INGUINAL HERNIA REPAIR Left 06/28/2014   Procedure: Left  INGUINAL HERNIA REPAIR ;  Surgeon: Merrie Roof, MD;  Location: [facility];  Service: General;  Laterality: Left;  . INSERTION OF MESH Left 06/28/2014   Procedure: INSERTION OF MESH;  Surgeon: Merrie Roof, MD;  Location: [facility];  Service: General;  Laterality: Left;  . INSERTION OF MESH N/A 10/11/2014  . LAPAROSCOPY  10/11/2014  . LAPAROTOMY N/A 09/11/2015   Procedure: EXPLORATORY LAPAROTOMY;  Surgeon: Excell Seltzer, MD;  Location: [facility];  Service: General;  Laterality: N/A;  . MASS EXCISION Left 03/21/2017   Procedure: EXCISION BASAL CELL CARCINOMA LEFT EAR;  Surgeon: Leta Baptist, MD;  Location: [facility];  Service: ENT;  Laterality: Left;  . PARTIAL COLECTOMY N/A 09/11/2015   Procedure: SIGMOID COLECTOMY AND COLOSTOMY;  Surgeon:  Excell Seltzer, MD;  Location: [facility];  Service: General;  Laterality: N/A;  . right leg fracture age 49    . SKIN FULL THICKNESS GRAFT Left 03/21/2017   Procedure: FULL THICKNESS SKIN GRAFT TO LEFT EAR;  Surgeon: Leta Baptist, MD;  Location: [facility];  Service: ENT;  Laterality: Left;  . TONSILLECTOMY  age 73 or 28  . VENTRAL HERNIA REPAIR  10/11/2014   Procedure: HERNIA REPAIR VENTRAL ADULT;  Surgeon: Autumn Messing III, MD;  Location: [place];  Service: General;;        Home Medications    Prior to Admission medications   Medication Sig Start Date End Date Taking? Authorizing Provider  acetaminophen (TYLENOL) 325 MG tablet Take 325 mg by mouth every 6 (six) hours as needed for mild pain.     [provider]  amLODipine (NORVASC) 5 MG tablet Take 5 mg by mouth daily. 01/17/18   [provider]  Artificial Tear Solution (SOOTHE XP OP) Apply 1 drop to eye daily.    [provider]  Cyanocobalamin (VITAMIN B-12 PO) Take 1 tablet by mouth daily.    [provider]  ferrous sulfate 325 (65 FE) MG tablet Take 325 mg by mouth daily with breakfast.    [provider]  hydrochlorothiazide (HYDRODIURIL) 25 MG tablet Take 12.5 mg by mouth daily.    [provider]  HYDROcodone-acetaminophen (NORCO/VICODIN) 5-325 MG tablet Take 1 tablet by mouth every 6 (six) hours as needed for moderate pain.    [provider]  isosorbide mononitrate (IMDUR) 60 MG 24 hr tablet Take 60 mg by mouth every evening.     [provider]  latanoprost (XALATAN) 0.005 % ophthalmic solution Place 1 drop into both eyes at bedtime.    [provider]  levothyroxine (SYNTHROID, LEVOTHROID) 75 MCG tablet Take 50 mcg by mouth daily before breakfast.     [provider]  metFORMIN (GLUCOPHAGE) 1000 MG tablet Take 1,000 mg by mouth daily with breakfast.     [provider]  metoprolol succinate (TOPROL-XL) 50 MG 24 hr tablet TAKE  ONE-HALF (1/2) TABLET BY MOUTH DAILY WITH OR AFTER A MEAL 10/29/19   Belva Crome, MD  Multiple Vitamin (MULTIVITAMIN WITH MINERALS) TABS tablet Take 1 tablet by mouth daily.    [provider]  pantoprazole (PROTONIX) 40 MG tablet Take 40 mg by mouth daily.    [provider]  potassium chloride (K-DUR,KLOR-CON) 10 MEQ tablet Take 1 tablet by mouth daily. 06/27/18   [provider]  simvastatin (ZOCOR) 20 MG tablet Take 20 mg by mouth every evening.  [provider]    Family History Family History  Problem Relation Age of Onset  . CAD Father   . Hyperlipidemia Father   . Cancer Mother     Social History Social History   Tobacco Use  . Smoking status: Former Smoker    Types: Cigars  . Smokeless tobacco: Never Used  Substance Use Topics  . Alcohol use: Yes    Comment: RARE  . Drug use: No    Comment: occ cigars     Allergies   Lasix [furosemide], Spironolactone, Aspirin, Lipitor [atorvastatin], Lopressor [metoprolol tartrate], and Penicillins   Review of Systems Review of Systems  All other systems reviewed and are negative.    Physical Exam Updated Vital Signs BP (!) 173/108   Pulse (!) 107   Resp (!) 22   Wt 72 kg   SpO2 98%   BMI 22.78 kg/m   Physical Exam Vitals signs and nursing note reviewed.  Constitutional:      General: He is not in acute distress.    Appearance: Normal appearance. He is well-developed. He is not toxic-appearing.  HENT:     Head: Normocephalic and atraumatic.  Eyes:     General: Lids are normal.     Conjunctiva/sclera: Conjunctivae normal.     Pupils: Pupils are equal, round, and reactive to light.  Neck:     Musculoskeletal: Normal range of motion and neck supple.     Thyroid: No thyroid mass.     Trachea: No tracheal deviation.  Cardiovascular:     Rate and Rhythm: Normal rate and regular rhythm.     Heart sounds: Normal heart sounds. No murmur. No gallop.   Pulmonary:     Effort:  Pulmonary effort is normal. No respiratory distress.     Breath sounds: Normal breath sounds. No stridor. No decreased breath sounds, wheezing, rhonchi or rales.  Abdominal:     General: Bowel sounds are normal. There is no distension.     Palpations: Abdomen is soft.     Tenderness: There is generalized abdominal tenderness. There is guarding. There is no rebound.  Musculoskeletal: Normal range of motion.        General: No tenderness.  Skin:    General: Skin is warm and dry.     Findings: No abrasion or rash.  Neurological:     Mental Status: He is alert and oriented to person, place, and time.     GCS: GCS eye subscore is 4. GCS verbal subscore is 5. GCS motor subscore is 6.     Cranial Nerves: No cranial nerve deficit.     Sensory: No sensory deficit.  Psychiatric:        Speech: Speech normal.        Behavior: Behavior normal.      ED Treatments / Results  Labs (all labs ordered are listed, but only abnormal results are displayed) Labs Reviewed  CBC WITH DIFFERENTIAL/PLATELET  COMPREHENSIVE METABOLIC PANEL  LIPASE, BLOOD  URINALYSIS, ROUTINE W REFLEX MICROSCOPIC    EKG None  Radiology No results found.  Procedures Procedures (including critical care time)  Medications Ordered in ED Medications  sodium chloride 0.9 % bolus 500 mL (has no administration in time range)  0.9 %  sodium chloride infusion (has no administration in time range)     Initial Impression / Assessment and Plan / ED Course  I have reviewed the triage vital signs and the nursing notes.  Pertinent labs & imaging results that were  available during my care of the patient were reviewed by me and considered in my medical decision making (see chart for details).        With evidence of diverticulitis.  Has not been tolerating oral fluids.  Given IV hydration here and will admit to the hospital service  Final Clinical Impressions(s) / ED Diagnoses   Final diagnoses:  None    ED  Discharge Orders    None       Lacretia Leigh, MD 11/02/19 1334

## 2019-11-03 DIAGNOSIS — K5732 Diverticulitis of large intestine without perforation or abscess without bleeding: Secondary | ICD-10-CM | POA: Diagnosis present

## 2019-11-03 DIAGNOSIS — Z66 Do not resuscitate: Secondary | ICD-10-CM | POA: Diagnosis present

## 2019-11-03 DIAGNOSIS — Z951 Presence of aortocoronary bypass graft: Secondary | ICD-10-CM | POA: Diagnosis not present

## 2019-11-03 DIAGNOSIS — M109 Gout, unspecified: Secondary | ICD-10-CM | POA: Diagnosis present

## 2019-11-03 DIAGNOSIS — E118 Type 2 diabetes mellitus with unspecified complications: Secondary | ICD-10-CM

## 2019-11-03 DIAGNOSIS — Z888 Allergy status to other drugs, medicaments and biological substances status: Secondary | ICD-10-CM | POA: Diagnosis not present

## 2019-11-03 DIAGNOSIS — K5792 Diverticulitis of intestine, part unspecified, without perforation or abscess without bleeding: Secondary | ICD-10-CM | POA: Diagnosis present

## 2019-11-03 DIAGNOSIS — I10 Essential (primary) hypertension: Secondary | ICD-10-CM

## 2019-11-03 DIAGNOSIS — Z886 Allergy status to analgesic agent status: Secondary | ICD-10-CM | POA: Diagnosis not present

## 2019-11-03 DIAGNOSIS — I25810 Atherosclerosis of coronary artery bypass graft(s) without angina pectoris: Secondary | ICD-10-CM

## 2019-11-03 DIAGNOSIS — E785 Hyperlipidemia, unspecified: Secondary | ICD-10-CM | POA: Diagnosis present

## 2019-11-03 DIAGNOSIS — Y929 Unspecified place or not applicable: Secondary | ICD-10-CM | POA: Diagnosis not present

## 2019-11-03 DIAGNOSIS — Z87442 Personal history of urinary calculi: Secondary | ICD-10-CM | POA: Diagnosis not present

## 2019-11-03 DIAGNOSIS — Z880 Allergy status to penicillin: Secondary | ICD-10-CM | POA: Diagnosis not present

## 2019-11-03 DIAGNOSIS — E876 Hypokalemia: Secondary | ICD-10-CM

## 2019-11-03 DIAGNOSIS — M1990 Unspecified osteoarthritis, unspecified site: Secondary | ICD-10-CM | POA: Diagnosis present

## 2019-11-03 DIAGNOSIS — Z20828 Contact with and (suspected) exposure to other viral communicable diseases: Secondary | ICD-10-CM | POA: Diagnosis present

## 2019-11-03 DIAGNOSIS — Z7984 Long term (current) use of oral hypoglycemic drugs: Secondary | ICD-10-CM | POA: Diagnosis not present

## 2019-11-03 DIAGNOSIS — E039 Hypothyroidism, unspecified: Secondary | ICD-10-CM | POA: Diagnosis present

## 2019-11-03 DIAGNOSIS — T3695XA Adverse effect of unspecified systemic antibiotic, initial encounter: Secondary | ICD-10-CM | POA: Diagnosis present

## 2019-11-03 DIAGNOSIS — K219 Gastro-esophageal reflux disease without esophagitis: Secondary | ICD-10-CM | POA: Diagnosis present

## 2019-11-03 DIAGNOSIS — F419 Anxiety disorder, unspecified: Secondary | ICD-10-CM | POA: Diagnosis present

## 2019-11-03 DIAGNOSIS — Z85820 Personal history of malignant melanoma of skin: Secondary | ICD-10-CM | POA: Diagnosis not present

## 2019-11-03 DIAGNOSIS — I2510 Atherosclerotic heart disease of native coronary artery without angina pectoris: Secondary | ICD-10-CM | POA: Diagnosis present

## 2019-11-03 DIAGNOSIS — R110 Nausea: Secondary | ICD-10-CM | POA: Diagnosis present

## 2019-11-03 DIAGNOSIS — Z8249 Family history of ischemic heart disease and other diseases of the circulatory system: Secondary | ICD-10-CM | POA: Diagnosis not present

## 2019-11-03 DIAGNOSIS — D638 Anemia in other chronic diseases classified elsewhere: Secondary | ICD-10-CM | POA: Diagnosis present

## 2019-11-03 LAB — BASIC METABOLIC PANEL
Anion gap: 10 (ref 5–15)
BUN: 13 mg/dL (ref 8–23)
CO2: 18 mmol/L — ABNORMAL LOW (ref 22–32)
Calcium: 8.1 mg/dL — ABNORMAL LOW (ref 8.9–10.3)
Chloride: 110 mmol/L (ref 98–111)
Creatinine, Ser: 0.78 mg/dL (ref 0.61–1.24)
GFR calc Af Amer: >60 mL/min (ref >60)
GFR calc non Af Amer: >60 mL/min (ref >60)
Glucose, Bld: 136 mg/dL — ABNORMAL HIGH (ref 70–99)
Potassium: 2.9 mmol/L — ABNORMAL LOW (ref 3.5–5.1)
Sodium: 138 mmol/L (ref 135–145)

## 2019-11-03 LAB — CBC
HCT: 31.6 % — ABNORMAL LOW (ref 39.0–52.0)
Hemoglobin: 10.3 g/dL — ABNORMAL LOW (ref 13.0–17.0)
MCH: 31.9 pg (ref 26.0–34.0)
MCHC: 32.6 g/dL (ref 30.0–36.0)
MCV: 97.8 fL (ref 80.0–100.0)
Platelets: 281 10*3/uL (ref 150–400)
RBC: 3.23 MIL/uL — ABNORMAL LOW (ref 4.22–5.81)
RDW: 13.1 % (ref 11.5–15.5)
WBC: 5.5 10*3/uL (ref 4.0–10.5)
nRBC: 0 % (ref 0.0–0.2)

## 2019-11-03 LAB — GLUCOSE, CAPILLARY
Glucose-Capillary: 116 mg/dL — ABNORMAL HIGH (ref 70–99)
Glucose-Capillary: 167 mg/dL — ABNORMAL HIGH (ref 70–99)
Glucose-Capillary: 99 mg/dL (ref 70–99)
Glucose-Capillary: 147 mg/dL — ABNORMAL HIGH (ref 70–99)

## 2019-11-03 MED ORDER — CIPROFLOXACIN HCL 500 MG PO TABS
500.0000 mg | ORAL_TABLET | Freq: Two times a day (BID) | ORAL | Status: DC
  Administered 2019-11-04: 500 mg via ORAL
  Filled 2019-11-03: qty 1

## 2019-11-03 MED ORDER — POTASSIUM CHLORIDE 10 MEQ/100ML IV SOLN
10.0000 meq | INTRAVENOUS | Status: AC
  Administered 2019-11-03 (×4): 10 meq via INTRAVENOUS
  Filled 2019-11-03 (×4): qty 100

## 2019-11-03 MED ORDER — METRONIDAZOLE 500 MG PO TABS
500.0000 mg | ORAL_TABLET | Freq: Three times a day (TID) | ORAL | Status: DC
  Administered 2019-11-03 – 2019-11-04 (×3): 500 mg via ORAL
  Filled 2019-11-03 (×3): qty 1

## 2019-11-03 NOTE — Evaluation (Signed)
Physical Therapy Evaluation Patient Details Name: Kenneth [name] MRN: [id number] DOB: Jun 17, 1928 Today's Date: 11/03/2019   History of Present Illness  85yo male c/o LLQ pain, inability to eat since Monday. Admitted for diverticulitis. PMH CAD, DM, hernia s/p repair with mesh, gout, HTN, HLD, [place] fever, CABGx3  Clinical Impression   Patient received in bed, very HOH but willing to participate in PT session; education provided on mask wear for patient when staff are in room and for at all times for visitors per new [facility] policies, both patient and wife agreeable. Able to complete bed mobility with Mod(I) and extended time, functional transfers with S and no device, and gait approximately 275f with min guard/no device and deviations as described below. He was left in bed with all needs met, bed alarm active. Currently recommending skilled OP PT services moving forward.     Follow Up Recommendations Outpatient PT    Equipment Recommendations  None recommended by PT    Recommendations for Other Services       Precautions / Restrictions Precautions Precautions: Fall Restrictions Weight Bearing Restrictions: No      Mobility  Bed Mobility Overal bed mobility: Modified Independent             General bed mobility comments: increased time and effort  Transfers Overall transfer level: Needs assistance Equipment used: None Transfers: Sit to/from Stand Sit to Stand: Supervision         General transfer comment: S for safety and IV management  Ambulation/Gait Ambulation/Gait assistance: Min guard Gait Distance (Feet): 250 Feet Assistive device: None Gait Pattern/deviations: Step-through pattern;Decreased dorsiflexion - right;Decreased dorsiflexion - left;Wide base of support;Drifts right/left Gait velocity: decreased   General Gait Details: gait generally WNL, did need cues to slow down as unsteadiness did increase with speed, slightly wide  BOS  Stairs            Wheelchair Mobility    Modified Rankin (Stroke Patients Only)       Balance Overall balance assessment: Mild deficits observed, not formally tested                                           Pertinent Vitals/Pain Pain Assessment: No/denies pain    Home Living Family/patient expects to be discharged to:: Private residence Living Arrangements: Spouse/significant other Available Help at Discharge: Family;Available 24 hours/day Type of Home: House Home Access: Stairs to enter Entrance Stairs-Rails: Can reach both Entrance Stairs-Number of Steps: 2 from the garage, 6 from the side Home Layout: One level Home Equipment: [place]- 2 wheels;Shower seat Additional Comments: walker but does not use it    Prior Function Level of Independence: Independent               Hand Dominance        Extremity/Trunk Assessment   Upper Extremity Assessment Upper Extremity Assessment: Overall WFL for tasks assessed    Lower Extremity Assessment Lower Extremity Assessment: Overall WFL for tasks assessed    Cervical / Trunk Assessment Cervical / Trunk Assessment: Kyphotic  Communication   Communication: HOH  Cognition Arousal/Alertness: Awake/alert Behavior During Therapy: WFL for tasks assessed/performed Overall Cognitive Status: Within Functional Limits for tasks assessed  General Comments      Exercises     Assessment/Plan    PT Assessment Patient needs continued PT services  PT Problem List Decreased strength;Decreased balance;Decreased coordination       PT Treatment Interventions DME instruction;Balance training;Gait training;Neuromuscular re-education;Stair training;Functional mobility training;Patient/family education;Therapeutic activities;Therapeutic exercise    PT Goals (Current goals can be found in the Care Plan section)  Acute Rehab PT Goals Patient  Stated Goal: go home but not so early that I have to come back to the hospital PT Goal Formulation: With patient/family Time For Goal Achievement: 11/17/19 Potential to Achieve Goals: Good    Frequency Min 3X/week   Barriers to discharge        Co-evaluation               AM-PAC PT "6 Clicks" Mobility  Outcome Measure Help needed turning from your back to your side while in a flat bed without using bedrails?: A Little Help needed moving from lying on your back to sitting on the side of a flat bed without using bedrails?: A Little Help needed moving to and from a bed to a chair (including a wheelchair)?: A Little Help needed standing up from a chair using your arms (e.g., wheelchair or bedside chair)?: A Little Help needed to walk in hospital room?: A Little Help needed climbing 3-5 steps with a railing? : A Little 6 Click Score: 18    End of Session   Activity Tolerance: Patient tolerated treatment well Patient left: in bed;with call bell/phone within reach;with bed alarm set;with family/visitor present   PT Visit Diagnosis: Unsteadiness on feet (R26.81);History of falling (Z91.81);Muscle weakness (generalized) (M62.81)    Time: 3710-6269 PT Time Calculation (min) (ACUTE ONLY): 12 min   Charges:   PT Evaluation $PT Eval Low Complexity: 1 Low          Windell Norfolk, DPT, PN1   Supplemental Physical Therapist [place]    Pager [phone] Acute Rehab Office [phone]

## 2019-11-03 NOTE — Progress Notes (Signed)
PROGRESS NOTE  Kenneth [name] [id number] DOB: 06-16-1928 DOA: 11/02/2019 PCP: Lavone Orn, MD  HPI/Recap of past 24 hours: HPI from Dr Kenneth [name] is a 85 y.o. male with medical history significant of hypothyroidism; HTN; HLD; DM; and CAD s/p CABG 1993 presenting with LLQ pain, anorexia, nausea/vomiting intermittently for the past 2 weeks. Pt had colectomy about 2-3 years ago. In the ED, CT abdomen showed diverticulitis.  Patient admitted for further management    Today, patient reports feeling slightly better, abdominal pain has reduced but not completely gone.  Patient was able to tolerate liquid diet, will advance to soft diet and monitor patient's to see if he tolerates it.  Denies any chest pain, shortness of breath, fever/chills, no further nausea/vomiting noted.  Assessment/Plan: Principal Problem:   Diverticulitis Active Problems:   HTN (hypertension)   Coronary artery disease involving coronary bypass graft of native heart without angina pectoris   Hyperlipidemia with target LDL less than 70   Type 2 diabetes mellitus with complication, without long-term current use of insulin (HCC)   Diverticulitis Currently afebrile, with no leukocytosis CT abdomen/pelvis showed diverticulitis, prior hemicolectomy, large hiatal hernia Continue Cipro, Flagyl Continue gentle IV fluids, advance diet to soft If not improving, may need surgery consultation Monitor closely  Hypokalemia Please as needed  Anemia of chronic disease Hemoglobin 10.3 today, 14.7 on presentation (likely hemoconcentration) No overt signs of bleeding Anemia panel in a.m. Daily CBC  Hypertension BP stable Continue Norvasc, hold HCTZ  Diabetes mellitus type 2 Last A1c (remote) was 6.7 SSI, Accu-Cheks, hypoglycemic protocol Hold home Metformin  CAD Currently chest pain-free Continue Imdur, metoprolol, not on ASA  HLD Continue Zocor  Hypothyroidism TSH 2.833 Continue Synthroid          Malnutrition Type:      Malnutrition Characteristics:      Nutrition Interventions:       Estimated body mass index is 21.19 kg/m as calculated from the following:   Height as of 07/23/19: 5\' 10"  (1.778 m).   Weight as of this encounter: 67 kg.     Code Status: DNR  Family Communication: None at bedside  Disposition Plan: Likely home   Consultants:  None  Procedures:  None  Antimicrobials:  Ciprofloxacin  Metronidazole  DVT prophylaxis: Lovenox   Objective: Vitals:   11/02/19 2008 11/02/19 2145 11/03/19 0517 11/03/19 0912  BP: 129/70 130/66 (!) 103/57 139/75  Pulse: 96 91 87 71  Resp: 20 16 16 18   Temp: 98 F (36.7 C) 98.2 F (36.8 C) 98.4 F (36.9 C) 97.6 F (36.4 C)  TempSrc: Oral   Oral  SpO2: 97% 95% 95% 99%  Weight:  67 kg      Intake/Output Summary (Last 24 hours) at 11/03/2019 1312 Last data filed at 11/03/2019 1253 Gross per 24 hour  Intake 3566.11 ml  Output 200 ml  Net 3366.11 ml   Filed Weights   11/02/19 0758 11/02/19 2145  Weight: 72 kg 67 kg    Exam:  General: NAD, elderly male  Cardiovascular: S1, S2 present  Respiratory: CTAB  Abdomen: Soft, mild tenderness at LLQ, nondistended, bowel sounds present  Musculoskeletal: No bilateral pedal edema noted  Skin: Normal  Psychiatry: Normal mood   Data Reviewed: CBC: Recent Labs  Lab 11/02/19 0815 11/03/19 0536  WBC 7.2 5.5  NEUTROABS 5.8  --   HGB 14.7 10.3*  HCT 45.3 31.6*  MCV 98.1 97.8  PLT 346 [id number]   Basic Metabolic Panel:  Recent Labs  Lab 11/02/19 0815 11/03/19 0536  NA 140 138  K 3.9 2.9*  CL 105 110  CO2 18* 18*  GLUCOSE 172* 136*  BUN 18 13  CREATININE 1.06 0.78  CALCIUM 9.7 8.1*   GFR: Estimated Creatinine Clearance: 59.3 mL/min (by C-G formula based on SCr of 0.78 mg/dL). Liver Function Tests: Recent Labs  Lab 11/02/19 0815  AST 21  ALT 11  ALKPHOS 113  BILITOT 1.1  PROT 7.8  ALBUMIN 3.7   Recent Labs   Lab 11/02/19 0815  LIPASE 19   No results for input(s): AMMONIA in the last 168 hours. Coagulation Profile: No results for input(s): INR, PROTIME in the last 168 hours. Cardiac Enzymes: No results for input(s): CKTOTAL, CKMB, CKMBINDEX, TROPONINI in the last 168 hours. BNP (last 3 results) No results for input(s): PROBNP in the last 8760 hours. HbA1C: No results for input(s): HGBA1C in the last 72 hours. CBG: Recent Labs  Lab 11/02/19 1654 11/02/19 2145 11/03/19 0653 11/03/19 1131  GLUCAP 120* 137* 116* 167*   Lipid Profile: No results for input(s): CHOL, HDL, LDLCALC, TRIG, CHOLHDL, LDLDIRECT in the last 72 hours. Thyroid Function Tests: Recent Labs    11/02/19 1524  TSH 2.833   Anemia Panel: No results for input(s): VITAMINB12, FOLATE, FERRITIN, TIBC, IRON, RETICCTPCT in the last 72 hours. Urine analysis:    Component Value Date/Time   COLORURINE AMBER (A) 11/02/2019 0838   APPEARANCEUR HAZY (A) 11/02/2019 0838   LABSPEC 1.025 11/02/2019 0838   PHURINE 5.0 11/02/2019 0838   GLUCOSEU NEGATIVE 11/02/2019 0838   HGBUR NEGATIVE 11/02/2019 0838   BILIRUBINUR NEGATIVE 11/02/2019 0838   KETONESUR 20 (A) 11/02/2019 0838   PROTEINUR 30 (A) 11/02/2019 0838   UROBILINOGEN 1.0 09/09/2015 1301   NITRITE NEGATIVE 11/02/2019 0838   LEUKOCYTESUR NEGATIVE 11/02/2019 0838   Sepsis Labs: @LABRCNTIP (procalcitonin:4,lacticidven:4)  ) Recent Results (from the past 240 hour(s))  SARS CORONAVIRUS 2 (TAT 6-24 HRS) Nasopharyngeal Nasopharyngeal Swab     Status: None   Collection Time: 11/02/19  1:38 PM   Specimen: Nasopharyngeal Swab  Result Value Ref Range Status   SARS Coronavirus 2 NEGATIVE NEGATIVE Final    Comment: (NOTE) SARS-CoV-2 target nucleic acids are NOT DETECTED. The SARS-CoV-2 RNA is generally detectable in upper and lower respiratory specimens during the acute phase of infection. Negative results do not preclude SARS-CoV-2 infection, do not rule out  co-infections with other pathogens, and should not be used as the sole basis for treatment or other patient management decisions. Negative results must be combined with clinical observations, patient history, and epidemiological information. The expected result is Negative. Fact Sheet for Patients: SugarRoll.be Fact Sheet for Healthcare Providers: https://www.woods-mathews.com/ This test is not yet approved or cleared by the [place] FDA and  has been authorized for detection and/or diagnosis of SARS-CoV-2 by FDA under an Emergency Use Authorization (EUA). This EUA will remain  in effect (meaning this test can be used) for the duration of the COVID-19 declaration under Section 56 4(b)(1) of the Act, 21 U.S.C. section 360bbb-3(b)(1), unless the authorization is terminated or revoked sooner. Performed at [facility], [place] [address] [place], [place] [address]       Studies: No results found.  Scheduled Meds: . amLODipine  5 mg Oral Daily  . enoxaparin (LOVENOX) injection  40 mg Subcutaneous Q24H  . insulin aspart  0-9 Units Subcutaneous TID WC  . isosorbide mononitrate  60 mg Oral QPM  . latanoprost  1 drop Both Eyes QHS  . levothyroxine  50 mcg Oral QAC breakfast  . metoprolol succinate  25 mg Oral Daily  . ondansetron  4 mg Intravenous Once  . simvastatin  20 mg Oral QPM    Continuous Infusions: . sodium chloride 100 mL/hr at 11/03/19 1110  . ciprofloxacin 400 mg (11/03/19 1252)  . metronidazole 500 mg (11/03/19 0853)  . potassium chloride 10 mEq (11/03/19 1251)     LOS: 0 days     Alma Friendly, MD [facility]  If 7PM-7AM, please contact night-coverage [email or web address] 11/03/2019, 1:12 PM

## 2019-11-03 NOTE — Evaluation (Signed)
Occupational Therapy Evaluation and Discharge Patient Details Name: Kenneth [name] MRN: [id number] DOB: [birth year] Today's Date: 11/03/2019    History of Present Illness 85yo male c/o LLQ pain, inability to eat since Monday. Admitted for diverticulitis. PMH CAD, DM, hernia s/p repair with mesh, gout, HTN, HLD, rocky mountain spotted fever, CABGx3   Clinical Impression   This 85 yo male admitted with above presents to acute OT with mild balance deficits (he will tell you so as well--reports ever since he had rocky mountain spotted fever ~3 years ago) and not really open to any AD for ambulation or safety in shower--"I do pretty good for an 85 yo". Pt currently only needs S and his wife his at home with him.No further OT needs, we will sign off.    Follow Up Recommendations  No OT follow up;Supervision/Assistance - 24 hour    Equipment Recommendations  None recommended by OT       Precautions / Restrictions Precautions Precautions: Fall Restrictions Weight Bearing Restrictions: No      Mobility Bed Mobility Overal bed mobility: Modified Independent             General bed mobility comments: HOB up  Transfers Overall transfer level: Needs assistance Equipment used: None Transfers: Sit to/from Stand Sit to Stand: Supervision            Balance Overall balance assessment: Mild deficits observed, not formally tested                                         ADL either performed or assessed with clinical judgement   ADL Overall ADL's : Needs assistance/impaired                                       General ADL Comments: Overall at a S level     Vision Baseline Vision/History: Wears glasses Wears Glasses: At all times Patient Visual Report: No change from baseline              Pertinent Vitals/Pain Pain Assessment: ("stiffness from arthritis")     Hand Dominance Right   Extremity/Trunk Assessment Upper Extremity  Assessment Upper Extremity Assessment: Overall WFL for tasks assessed     Communication Communication Communication: HOH   Cognition Arousal/Alertness: Awake/alert Behavior During Therapy: WFL for tasks assessed/performed Overall Cognitive Status: Within Functional Limits for tasks assessed                                                Home Living Family/patient expects to be discharged to:: Private residence Living Arrangements: Spouse/significant other Available Help at Discharge: Family;Available 24 hours/day Type of Home: House Home Access: Stairs to enter [employer] of Steps: 2 from the garage, 6 from the side Entrance Stairs-Rails: Can reach both Home Layout: One level     Bathroom Shower/Tub: Teacher, early years/pre: Standard Bathroom Accessibility: Yes   Home Equipment: Walker - 2 wheels   Additional Comments: walker but does not use it, per pt report his wife has the beginning of dementia      Prior Functioning/Environment Level of Independence: Independent  OT Problem List: Impaired balance (sitting and/or standing);Pain         OT Goals(Current goals can be found in the care plan section) Acute Rehab OT Goals Patient Stated Goal: to go home, but I want to make sure I am well, I don't want to come back  OT Frequency:                AM-PAC OT "6 Clicks" Daily Activity     Outcome Measure Help from another person eating meals?: None Help from another person taking care of personal grooming?: A Little Help from another person toileting, which includes using toliet, bedpan, or urinal?: A Little Help from another person bathing (including washing, rinsing, drying)?: A Little Help from another person to put on and taking off regular upper body clothing?: A Little Help from another person to put on and taking off regular lower body clothing?: A Little 6 Click Score: 19   End of Session  Equipment Utilized During Treatment: Gait belt  Activity Tolerance: Patient tolerated treatment well Patient left: in bed;with call bell/phone within reach;with bed alarm set  OT Visit Diagnosis: Unsteadiness on feet (R26.81);Pain Pain - Right/Left: (both (stiffness)) Pain - part of body: Leg                Time: [id number] OT Time Calculation (min): 18 min Charges:  OT General Charges $OT Visit: 1 Visit OT Evaluation $OT Eval Moderate Complexity: 1 Mod  Golden Circle, OTR/L Acute [employer] Pager [phone] Office [phone]     Almon Register 11/03/2019, 4:57 PM

## 2019-11-04 DIAGNOSIS — E785 Hyperlipidemia, unspecified: Secondary | ICD-10-CM

## 2019-11-04 LAB — CBC WITH DIFFERENTIAL/PLATELET
Abs Immature Granulocytes: 0.06 10*3/uL (ref 0.00–0.07)
Basophils Absolute: 0 10*3/uL (ref 0.0–0.1)
Basophils Relative: 1 %
Eosinophils Absolute: 0.1 10*3/uL (ref 0.0–0.5)
Eosinophils Relative: 1 %
HCT: 30.2 % — ABNORMAL LOW (ref 39.0–52.0)
Hemoglobin: 10 g/dL — ABNORMAL LOW (ref 13.0–17.0)
Immature Granulocytes: 1 %
Lymphocytes Relative: 16 %
Lymphs Abs: 0.9 10*3/uL (ref 0.7–4.0)
MCH: 32.2 pg (ref 26.0–34.0)
MCHC: 33.1 g/dL (ref 30.0–36.0)
MCV: 97.1 fL (ref 80.0–100.0)
Monocytes Absolute: 0.6 10*3/uL (ref 0.1–1.0)
Monocytes Relative: 10 %
Neutro Abs: 3.9 10*3/uL (ref 1.7–7.7)
Neutrophils Relative %: 71 %
Platelets: 273 10*3/uL (ref 150–400)
RBC: 3.11 MIL/uL — ABNORMAL LOW (ref 4.22–5.81)
RDW: 13.1 % (ref 11.5–15.5)
WBC: 5.5 10*3/uL (ref 4.0–10.5)
nRBC: 0 % (ref 0.0–0.2)

## 2019-11-04 LAB — BASIC METABOLIC PANEL
Anion gap: 9 (ref 5–15)
BUN: 6 mg/dL — ABNORMAL LOW (ref 8–23)
CO2: 16 mmol/L — ABNORMAL LOW (ref 22–32)
Calcium: 7.8 mg/dL — ABNORMAL LOW (ref 8.9–10.3)
Chloride: 114 mmol/L — ABNORMAL HIGH (ref 98–111)
Creatinine, Ser: 0.72 mg/dL (ref 0.61–1.24)
GFR calc Af Amer: >60 mL/min (ref >60)
GFR calc non Af Amer: >60 mL/min (ref >60)
Glucose, Bld: 138 mg/dL — ABNORMAL HIGH (ref 70–99)
Potassium: 3 mmol/L — ABNORMAL LOW (ref 3.5–5.1)
Sodium: 139 mmol/L (ref 135–145)

## 2019-11-04 LAB — GLUCOSE, CAPILLARY: Glucose-Capillary: 132 mg/dL — ABNORMAL HIGH (ref 70–99)

## 2019-11-04 LAB — IRON AND TIBC
Iron: 22 ug/dL — ABNORMAL LOW (ref 45–182)
Saturation Ratios: 10 % — ABNORMAL LOW (ref 17.9–39.5)
TIBC: 211 ug/dL — ABNORMAL LOW (ref 250–450)
UIBC: 189 ug/dL

## 2019-11-04 LAB — FOLATE: Folate: 17.6 ng/mL (ref >5.9)

## 2019-11-04 LAB — FERRITIN: Ferritin: 24 ng/mL (ref 24–336)

## 2019-11-04 LAB — VITAMIN B12: Vitamin B-12: 1914 pg/mL — ABNORMAL HIGH (ref 180–914)

## 2019-11-04 MED ORDER — ONDANSETRON HCL 4 MG PO TABS: 4.0000 mg | ORAL_TABLET | Freq: Four times a day (QID) | ORAL | 0 refills | Status: DC | PRN

## 2019-11-04 MED ORDER — METRONIDAZOLE 500 MG PO TABS: 500.0000 mg | ORAL_TABLET | Freq: Three times a day (TID) | ORAL | 0 refills | Status: DC

## 2019-11-04 MED ORDER — POTASSIUM CHLORIDE CRYS ER 10 MEQ PO TBCR
EXTENDED_RELEASE_TABLET | ORAL | Status: AC
  Filled 2019-11-04: qty 4

## 2019-11-04 MED ORDER — CIPROFLOXACIN HCL 500 MG PO TABS: 500.0000 mg | ORAL_TABLET | Freq: Two times a day (BID) | ORAL | 0 refills | Status: DC

## 2019-11-04 MED ORDER — POTASSIUM CHLORIDE CRYS ER 20 MEQ PO TBCR
40.0000 meq | EXTENDED_RELEASE_TABLET | Freq: Once | ORAL | Status: AC
  Administered 2019-11-04: 40 meq via ORAL
  Filled 2019-11-04: qty 2

## 2019-11-04 NOTE — Discharge Summary (Signed)
Discharge Summary  Kenneth [name] [id number] DOB: December 06, 1928  PCP: Kenneth Orn, MD  Admit date: 11/02/2019 Discharge date: 11/04/2019  Time spent: 40 mins  Recommendations for Outpatient Follow-up:  1. PCP in 1 week with follow up labs  Discharge Diagnoses:  Active Hospital Problems   Diagnosis Date Noted  . Diverticulitis 11/02/2019  . Type 2 diabetes mellitus with complication, without long-term current use of insulin ([facility]) 09/09/2015  . HTN (hypertension)   . Hyperlipidemia with target LDL less than 70   . Coronary artery disease involving coronary bypass graft of native heart without angina pectoris     Resolved Hospital Problems  No resolved problems to display.    Discharge Condition: Stable   Diet recommendation: Soft diet  Vitals:   11/04/19 0355 11/04/19 0923  BP: 128/60 (!) 142/60  Pulse: 65 69  Resp: 18 18  Temp: 97.6 F (36.4 C) 97.8 F (36.6 C)  SpO2: 98% 98%    History of present illness:  Kenneth Kenneth Hanneris a 85 y.o.malewith medical history significant ofhypothyroidism; HTN; HLD; DM; and CAD s/p CABG 1993 presenting with LLQ pain, anorexia, nausea/vomiting intermittently for the past 2 weeks. Pt had colectomy about 2-3 years ago. In the ED, CT abdomen showed diverticulitis.  Patient admitted for further management.    Today, patient reports feeling better, denies any significant abdominal pain, was able to tolerate a soft diet, although still reports some nausea, likely due to antibiotics.  Patient denies any vomiting, chest pain, shortness of breath, fever/chills.  Hospital Course:  Principal Problem:   Diverticulitis Active Problems:   HTN (hypertension)   Coronary artery disease involving coronary bypass graft of native heart without angina pectoris   Hyperlipidemia with target LDL less than 70   Type 2 diabetes mellitus with complication, without long-term current use of insulin ([facility])   Diverticulitis Currently afebrile, with  no leukocytosis CT abdomen/pelvis showed diverticulitis, prior hemicolectomy, large hiatal hernia Continue Cipro, Flagyl Advised to have a soft diet Patient may require colonoscopy/surgical consultation if diverticulitis reoccurs or remains persistent after treatment with antibiotics PCP to follow-up with repeat labs and possible referral in the future for possible GI/surgical consultation  Hypokalemia Continue home regimen  Anemia of chronic disease Hemoglobin 10 today, 14.7 on presentation (likely hemoconcentration) No overt signs of bleeding Anemia panel showed iron 22, sats 10, ferritin 24, TIBC 211 Continue iron supplementation PCP to follow-up  Hypertension BP stable Continue Norvasc, HCTZ  Diabetes mellitus type 2 Last A1c (remote) was 6.7 Continue home Metformin  CAD Currently chest pain-free Continue Imdur, metoprolol, not on ASA due to remote history of GI bleed  HLD Continue Zocor  Hypothyroidism TSH 2.833 Continue Synthroid          Malnutrition Type:      Malnutrition Characteristics:      Nutrition Interventions:      Estimated body mass index is 21.19 kg/m as calculated from the following:   Height as of 07/23/19: 5\' 10"  (1.778 m).   Weight as of this encounter: 67 kg.    Procedures:  None  Consultations:  None  Discharge Exam: BP (!) 142/60 (BP Location: Right Arm)   Pulse 69   Temp 97.8 F (36.6 C) (Oral)   Resp 18   Wt 67 kg   SpO2 98%   BMI 21.19 kg/m   General: NAD Cardiovascular: S1, S2 present Respiratory: CTA B Abdomen: Soft, nontender, nondistended, bowel sounds present  Discharge Instructions You were cared for by a hospitalist  during your hospital stay. If you have any questions about your discharge medications or the care you received while you were in the hospital after you are discharged, you can call the unit and asked to speak with the hospitalist on call if the hospitalist that took care of  you is not available. Once you are discharged, your primary care physician will handle any further medical issues. Please note that NO REFILLS for any discharge medications will be authorized once you are discharged, as it is imperative that you return to your primary care physician (or establish a relationship with a primary care physician if you do not have one) for your aftercare needs so that they can reassess your need for medications and monitor your lab values.  Discharge Instructions    Diet - low sodium heart healthy   Complete by: As directed    Increase activity slowly   Complete by: As directed      Allergies as of 11/04/2019      Reactions   Lasix [furosemide] Other (See Comments)   aches   Spironolactone Other (See Comments)   Red, puffy, eyes, almost hospitalized   Aspirin Other (See Comments)   Bleeding.     Lipitor [atorvastatin] Other (See Comments)   tired   Lopressor [metoprolol Tartrate] Other (See Comments)   Sleepy all the time 11.27.2020 Patient is currently taking Lopressor.   Penicillins Hives, Rash   Has patient had a PCN reaction causing immediate rash, facial/tongue/throat swelling, SOB or lightheadedness with hypotension: No Has patient had a PCN reaction causing severe rash involving mucus membranes or skin necrosis: No Has patient had a PCN reaction that required hospitalization No Has patient had a PCN reaction occurring within the last 10 years: Yes If all of the above answers are "NO", then may proceed with Cephalosporin use.      Medication List    TAKE these medications   acetaminophen 325 MG tablet Commonly known as: TYLENOL Take 325 mg by mouth every 6 (six) hours as needed for mild pain.   amLODipine 5 MG tablet Commonly known as: NORVASC Take 5 mg by mouth daily.   ciprofloxacin 500 MG tablet Commonly known as: CIPRO Take 1 tablet (500 mg total) by mouth 2 (two) times daily for 15 doses.   ferrous sulfate 325 (65 FE) MG tablet  Take 325 mg by mouth daily with breakfast.   hydrochlorothiazide 25 MG tablet Commonly known as: HYDRODIURIL Take 12.5 mg by mouth daily.   isosorbide mononitrate 60 MG 24 hr tablet Commonly known as: IMDUR Take 60 mg by mouth every evening.   latanoprost 0.005 % ophthalmic solution Commonly known as: XALATAN Place 1 drop into both eyes at bedtime.   levothyroxine 75 MCG tablet Commonly known as: SYNTHROID Take 50 mcg by mouth daily before breakfast.   metFORMIN 1000 MG tablet Commonly known as: GLUCOPHAGE Take 500 mg by mouth daily with breakfast.   metoprolol succinate 50 MG 24 hr tablet Commonly known as: TOPROL-XL TAKE ONE-HALF (1/2) TABLET BY MOUTH DAILY WITH OR AFTER A MEAL What changed:   how much to take  how to take this  when to take this  additional instructions   metroNIDAZOLE 500 MG tablet Commonly known as: FLAGYL Take 1 tablet (500 mg total) by mouth every 8 (eight) hours for 22 doses.   multivitamin with minerals Tabs tablet Take 1 tablet by mouth daily.   ondansetron 4 MG tablet Commonly known as: ZOFRAN Take 1 tablet (4  mg total) by mouth every 6 (six) hours as needed for nausea.   pantoprazole 40 MG tablet Commonly known as: PROTONIX Take 40 mg by mouth daily.   potassium chloride 10 MEQ tablet Commonly known as: KLOR-CON Take 10 mEq by mouth every Monday, Wednesday, and Friday.   simvastatin 20 MG tablet Commonly known as: ZOCOR Take 20 mg by mouth every evening.   SOOTHE XP OP Apply 1 drop to eye as needed (dry eyes).   VITAMIN B-12 PO Take 1 tablet by mouth daily.      Allergies  Allergen Reactions  . Lasix [Furosemide] Other (See Comments)    aches  . Spironolactone Other (See Comments)    Red, puffy, eyes, almost hospitalized  . Aspirin Other (See Comments)    Bleeding.    . Lipitor [Atorvastatin] Other (See Comments)    tired  . Lopressor [Metoprolol Tartrate] Other (See Comments)    Sleepy all the time 11.27.2020  Patient is currently taking Lopressor.  [name] Penicillins Hives and Rash    Has patient had a PCN reaction causing immediate rash, facial/tongue/throat swelling, SOB or lightheadedness with hypotension: No Has patient had a PCN reaction causing severe rash involving mucus membranes or skin necrosis: No Has patient had a PCN reaction that required hospitalization No Has patient had a PCN reaction occurring within the last 10 years: Yes If all of the above answers are "NO", then may proceed with Cephalosporin use.    Follow-up Information    Kenneth Orn, MD. Schedule an appointment as soon as possible for a visit in 1 week(s).   Specialty: Internal Medicine Contact information: [address] [place] [address] [phone]        Belva Crome, MD .   Specialty: Cardiology Contact information: [phone] [address] [place] [address] [phone]            The results of significant diagnostics from this hospitalization (including imaging, microbiology, ancillary and laboratory) are listed below for reference.    Significant Diagnostic Studies: Ct Abdomen Pelvis W Contrast  Result Date: 11/02/2019 CLINICAL DATA:  Abdominal distension. EXAM: CT ABDOMEN AND PELVIS WITH CONTRAST TECHNIQUE: Multidetector CT imaging of the abdomen and pelvis was performed using the standard protocol following bolus administration of intravenous contrast. CONTRAST:  130mL OMNIPAQUE IOHEXOL 300 MG/ML  SOLN COMPARISON:  09/10/2017 FINDINGS: Lower chest: Large hiatal hernia similar to prior study. Signs of mitral annular calcification and coronary artery disease. No signs of consolidation or evidence of pleural effusion. Hepatobiliary: Signs of hepatic cysts. No suspicious focal hepatic lesion. Gallbladder mildly distended without signs of pericholecystic fluid. No signs of biliary ductal dilation. Pancreas: Scattered calcific changes of the pancreas similar to prior  study. Subtle 9 mm low-attenuation focus in the pancreas appears cystic, potentially related to prior pancreatitis given the presence of calcifications. Spleen: Normal in size without focal abnormality. Adrenals/Urinary Tract: Mild thickening of the adrenal glands. Nonobstructing calculus lower pole right kidney measuring 8 mm. No signs of hydronephrosis. No signs of ureteral calculus on today's exam. No suspicious renal lesion. Stomach/Bowel: Organo-axial gastric volvulus without adjacent gastric stranding, stomach is incompletely imaged. Configuration unchanged since prior exam. Transverse segmental colonic thickening with signs of diverticulosis. Signs of left hemicolectomy. No signs of pneumatosis. Normal appendix. Vascular/Lymphatic: Calcified atherosclerosis of the abdominal aorta. No signs of aneurysm. No signs of adenopathy. Reproductive: Prostate unremarkable by CT. Other: No free air or abscess. Musculoskeletal: No sign of acute or destructive  bone process. IMPRESSION: 1. Segmental colitis in the setting of diverticulosis in the transverse colon, likely diverticulitis. Period colonic thickening is noted with adjacent small lymph nodes but no adenopathy. CT follow-up may be helpful to ensure resolution of changes and determine whether direct visualization may be warranted given changes of colonic thickening. 2. Signs of left hemicolectomy. 3. Stable large hiatal hernia. 4. Nonobstructing right lower pole renal calculus. 5. Signs of chronic calcific pancreatitis. Small cystic focus suggested in the midportion of the pancreas. CT follow-up could be utilized for this finding as well when assessing colonic findings. 6. Aortic atherosclerosis. 7. Signs of mitral annular calcification and coronary artery disease. Aortic Atherosclerosis (ICD10-I70.0). Electronically Signed   By: Zetta Bills M.D.   On: 11/02/2019 12:26    Microbiology: Recent Results (from the past 240 hour(s))  SARS CORONAVIRUS 2 (TAT 6-24  HRS) Nasopharyngeal Nasopharyngeal Swab     Status: None   Collection Time: 11/02/19  1:38 PM   Specimen: Nasopharyngeal Swab  Result Value Ref Range Status   SARS Coronavirus 2 NEGATIVE NEGATIVE Final    Comment: (NOTE) SARS-CoV-2 target nucleic acids are NOT DETECTED. The SARS-CoV-2 RNA is generally detectable in upper and lower respiratory specimens during the acute phase of infection. Negative results do not preclude SARS-CoV-2 infection, do not rule out co-infections with other pathogens, and should not be used as the sole basis for treatment or other patient management decisions. Negative results must be combined with clinical observations, patient history, and epidemiological information. The expected result is Negative. Fact Sheet for Patients: SugarRoll.be Fact Sheet for Healthcare Providers: https://www.woods-mathews.com/ This test is not yet approved or cleared by the [place] FDA and  has been authorized for detection and/or diagnosis of SARS-CoV-2 by FDA under an Emergency Use Authorization (EUA). This EUA will remain  in effect (meaning this test can be used) for the duration of the COVID-19 declaration under Section 56 4(b)(1) of the Act, 21 U.S.C. section 360bbb-3(b)(1), unless the authorization is terminated or revoked sooner. Performed at [facility], [place] [address] [place], [place] [address]      Labs: Basic Metabolic Panel: Recent Labs  Lab 11/02/19 0815 11/03/19 0536 11/04/19 0606  NA 140 138 139  K 3.9 2.9* 3.0*  CL 105 110 114*  CO2 18* 18* 16*  GLUCOSE 172* 136* 138*  BUN 18 13 6*  CREATININE 1.06 0.78 0.72  CALCIUM 9.7 8.1* 7.8*   Liver Function Tests: Recent Labs  Lab 11/02/19 0815  AST 21  ALT 11  ALKPHOS 113  BILITOT 1.1  PROT 7.8  ALBUMIN 3.7   Recent Labs  Lab 11/02/19 0815  LIPASE 19   No results for input(s): AMMONIA in the last 168 hours. CBC: Recent Labs  Lab  11/02/19 0815 11/03/19 0536 11/04/19 0606  WBC 7.2 5.5 5.5  NEUTROABS 5.8  --  3.9  HGB 14.7 10.3* 10.0*  HCT 45.3 31.6* 30.2*  MCV 98.1 97.8 97.1  PLT 346 281 273   Cardiac Enzymes: No results for input(s): CKTOTAL, CKMB, CKMBINDEX, TROPONINI in the last 168 hours. BNP: BNP (last 3 results) No results for input(s): BNP in the last 8760 hours.  ProBNP (last 3 results) No results for input(s): PROBNP in the last 8760 hours.  CBG: Recent Labs  Lab 11/03/19 0653 11/03/19 1131 11/03/19 1655 11/03/19 2123 11/04/19 0705  GLUCAP 116* 167* 99 147* 132*       Signed:  Alma Friendly, MD [facility] 11/04/2019, 11:41  AM

## 2019-11-04 NOTE — Progress Notes (Signed)
DISCHARGE NOTE HOME [facility] to be discharged Home per MD order. Discussed prescriptions and follow up appointments with the patient. Prescriptions given to patient; medication list explained in detail. Patient verbalized understanding.  Skin clean, dry and intact without evidence of skin break down, no evidence of skin tears noted. IV catheter discontinued intact. Site without signs and symptoms of complications. Dressing and pressure applied. Pt denies pain at the site currently. No complaints noted.  Patient free of lines, drains, and wounds.   An After Visit Summary (AVS) was printed and given to the patient. Patient escorted via wheelchair, and discharged home via private auto.  Aneta Mins BSN, RN3

## 2019-11-12 ENCOUNTER — Encounter (HOSPITAL_COMMUNITY): Payer: Self-pay

## 2019-11-12 ENCOUNTER — Other Ambulatory Visit: Payer: Self-pay

## 2019-11-12 ENCOUNTER — Ambulatory Visit (HOSPITAL_COMMUNITY): Admit: 2019-11-12 | Payer: Medicare Other | Admitting: Cardiovascular Disease

## 2019-11-12 ENCOUNTER — Encounter (HOSPITAL_COMMUNITY)
Admission: EM | Disposition: A | Discharge status: Home Health | Payer: Self-pay | Source: Home / Self Care | Attending: Cardiovascular Disease

## 2019-11-12 ENCOUNTER — Inpatient Hospital Stay (HOSPITAL_COMMUNITY): Payer: Medicare Other

## 2019-11-12 ENCOUNTER — Inpatient Hospital Stay (HOSPITAL_COMMUNITY)
Admission: EM | Admit: 2019-11-12 | Discharge: 2019-11-16 | DRG: 246 | Disposition: A | Discharge status: Home Health | Payer: Medicare Other | Attending: Cardiovascular Disease | Admitting: Cardiovascular Disease

## 2019-11-12 DIAGNOSIS — Z880 Allergy status to penicillin: Secondary | ICD-10-CM

## 2019-11-12 DIAGNOSIS — I25810 Atherosclerosis of coronary artery bypass graft(s) without angina pectoris: Secondary | ICD-10-CM | POA: Diagnosis present

## 2019-11-12 DIAGNOSIS — K219 Gastro-esophageal reflux disease without esophagitis: Secondary | ICD-10-CM | POA: Diagnosis present

## 2019-11-12 DIAGNOSIS — Z87442 Personal history of urinary calculi: Secondary | ICD-10-CM

## 2019-11-12 DIAGNOSIS — Z886 Allergy status to analgesic agent status: Secondary | ICD-10-CM

## 2019-11-12 DIAGNOSIS — Z20828 Contact with and (suspected) exposure to other viral communicable diseases: Secondary | ICD-10-CM | POA: Diagnosis present

## 2019-11-12 DIAGNOSIS — E119 Type 2 diabetes mellitus without complications: Secondary | ICD-10-CM | POA: Diagnosis present

## 2019-11-12 DIAGNOSIS — K5732 Diverticulitis of large intestine without perforation or abscess without bleeding: Secondary | ICD-10-CM | POA: Diagnosis present

## 2019-11-12 DIAGNOSIS — I2129 ST elevation (STEMI) myocardial infarction involving other sites: Secondary | ICD-10-CM | POA: Diagnosis present

## 2019-11-12 DIAGNOSIS — M1990 Unspecified osteoarthritis, unspecified site: Secondary | ICD-10-CM | POA: Diagnosis present

## 2019-11-12 DIAGNOSIS — M109 Gout, unspecified: Secondary | ICD-10-CM | POA: Diagnosis present

## 2019-11-12 DIAGNOSIS — I5021 Acute systolic (congestive) heart failure: Secondary | ICD-10-CM

## 2019-11-12 DIAGNOSIS — E118 Type 2 diabetes mellitus with unspecified complications: Secondary | ICD-10-CM | POA: Diagnosis present

## 2019-11-12 DIAGNOSIS — I2111 ST elevation (STEMI) myocardial infarction involving right coronary artery: Secondary | ICD-10-CM

## 2019-11-12 DIAGNOSIS — I255 Ischemic cardiomyopathy: Secondary | ICD-10-CM | POA: Diagnosis present

## 2019-11-12 DIAGNOSIS — Z888 Allergy status to other drugs, medicaments and biological substances status: Secondary | ICD-10-CM

## 2019-11-12 DIAGNOSIS — Z7984 Long term (current) use of oral hypoglycemic drugs: Secondary | ICD-10-CM

## 2019-11-12 DIAGNOSIS — Z8249 Family history of ischemic heart disease and other diseases of the circulatory system: Secondary | ICD-10-CM

## 2019-11-12 DIAGNOSIS — I219 Acute myocardial infarction, unspecified: Secondary | ICD-10-CM

## 2019-11-12 DIAGNOSIS — I2119 ST elevation (STEMI) myocardial infarction involving other coronary artery of inferior wall: Principal | ICD-10-CM | POA: Diagnosis present

## 2019-11-12 DIAGNOSIS — I213 ST elevation (STEMI) myocardial infarction of unspecified site: Secondary | ICD-10-CM

## 2019-11-12 DIAGNOSIS — I080 Rheumatic disorders of both mitral and aortic valves: Secondary | ICD-10-CM | POA: Diagnosis present

## 2019-11-12 DIAGNOSIS — F419 Anxiety disorder, unspecified: Secondary | ICD-10-CM | POA: Diagnosis present

## 2019-11-12 DIAGNOSIS — Z8349 Family history of other endocrine, nutritional and metabolic diseases: Secondary | ICD-10-CM

## 2019-11-12 DIAGNOSIS — D62 Acute posthemorrhagic anemia: Secondary | ICD-10-CM | POA: Diagnosis not present

## 2019-11-12 DIAGNOSIS — Z79899 Other long term (current) drug therapy: Secondary | ICD-10-CM | POA: Diagnosis not present

## 2019-11-12 DIAGNOSIS — K449 Diaphragmatic hernia without obstruction or gangrene: Secondary | ICD-10-CM | POA: Diagnosis present

## 2019-11-12 DIAGNOSIS — I10 Essential (primary) hypertension: Secondary | ICD-10-CM | POA: Diagnosis not present

## 2019-11-12 DIAGNOSIS — I110 Hypertensive heart disease with heart failure: Secondary | ICD-10-CM | POA: Diagnosis present

## 2019-11-12 DIAGNOSIS — E785 Hyperlipidemia, unspecified: Secondary | ICD-10-CM | POA: Diagnosis present

## 2019-11-12 DIAGNOSIS — H409 Unspecified glaucoma: Secondary | ICD-10-CM | POA: Diagnosis present

## 2019-11-12 DIAGNOSIS — I2510 Atherosclerotic heart disease of native coronary artery without angina pectoris: Secondary | ICD-10-CM | POA: Diagnosis present

## 2019-11-12 DIAGNOSIS — Z85820 Personal history of malignant melanoma of skin: Secondary | ICD-10-CM | POA: Diagnosis not present

## 2019-11-12 DIAGNOSIS — Z9049 Acquired absence of other specified parts of digestive tract: Secondary | ICD-10-CM

## 2019-11-12 DIAGNOSIS — Z955 Presence of coronary angioplasty implant and graft: Secondary | ICD-10-CM

## 2019-11-12 DIAGNOSIS — K921 Melena: Secondary | ICD-10-CM | POA: Diagnosis not present

## 2019-11-12 HISTORY — PX: CORONARY/GRAFT ACUTE MI REVASCULARIZATION: CATH118305

## 2019-11-12 LAB — LIPID PANEL
Cholesterol: 94 mg/dL (ref 0–200)
HDL: 25 mg/dL — ABNORMAL LOW (ref >40)
LDL Cholesterol: 50 mg/dL (ref 0–99)
Total CHOL/HDL Ratio: 3.8 RATIO
Triglycerides: 93 mg/dL (ref <150)
VLDL: 19 mg/dL (ref 0–40)

## 2019-11-12 LAB — CBC
HCT: 33.8 % — ABNORMAL LOW (ref 39.0–52.0)
HCT: 31.5 % — ABNORMAL LOW (ref 39.0–52.0)
Hemoglobin: 11 g/dL — ABNORMAL LOW (ref 13.0–17.0)
Hemoglobin: 10 g/dL — ABNORMAL LOW (ref 13.0–17.0)
MCH: 31.5 pg (ref 26.0–34.0)
MCH: 31.3 pg (ref 26.0–34.0)
MCHC: 32.5 g/dL (ref 30.0–36.0)
MCHC: 31.7 g/dL (ref 30.0–36.0)
MCV: 96.8 fL (ref 80.0–100.0)
MCV: 98.4 fL (ref 80.0–100.0)
Platelets: 380 10*3/uL (ref 150–400)
Platelets: 387 10*3/uL (ref 150–400)
RBC: 3.49 MIL/uL — ABNORMAL LOW (ref 4.22–5.81)
RBC: 3.2 MIL/uL — ABNORMAL LOW (ref 4.22–5.81)
RDW: 13.4 % (ref 11.5–15.5)
RDW: 13.4 % (ref 11.5–15.5)
WBC: 11.4 10*3/uL — ABNORMAL HIGH (ref 4.0–10.5)
WBC: 14.3 10*3/uL — ABNORMAL HIGH (ref 4.0–10.5)
nRBC: 0 % (ref 0.0–0.2)
nRBC: 0 % (ref 0.0–0.2)

## 2019-11-12 LAB — POCT ACTIVATED CLOTTING TIME
Activated Clotting Time: 626 seconds
Activated Clotting Time: 197 seconds
Activated Clotting Time: 153 seconds
Activated Clotting Time: 175 seconds
Activated Clotting Time: 169 seconds
Activated Clotting Time: 158 seconds
Activated Clotting Time: 208 seconds
Activated Clotting Time: 164 seconds

## 2019-11-12 LAB — COMPREHENSIVE METABOLIC PANEL
ALT: 20 U/L (ref 0–44)
AST: 22 U/L (ref 15–41)
Albumin: 2.2 g/dL — ABNORMAL LOW (ref 3.5–5.0)
Alkaline Phosphatase: 63 U/L (ref 38–126)
Anion gap: 9 (ref 5–15)
BUN: 10 mg/dL (ref 8–23)
CO2: 24 mmol/L (ref 22–32)
Calcium: 8.2 mg/dL — ABNORMAL LOW (ref 8.9–10.3)
Chloride: 104 mmol/L (ref 98–111)
Creatinine, Ser: 0.79 mg/dL (ref 0.61–1.24)
GFR calc Af Amer: >60 mL/min (ref >60)
GFR calc non Af Amer: >60 mL/min (ref >60)
Glucose, Bld: 156 mg/dL — ABNORMAL HIGH (ref 70–99)
Potassium: 3.8 mmol/L (ref 3.5–5.1)
Sodium: 137 mmol/L (ref 135–145)
Total Bilirubin: 0.6 mg/dL (ref 0.3–1.2)
Total Protein: 5.6 g/dL — ABNORMAL LOW (ref 6.5–8.1)

## 2019-11-12 LAB — PROTIME-INR
INR: 1.1 (ref 0.8–1.2)
Prothrombin Time: 14.3 seconds (ref 11.4–15.2)

## 2019-11-12 LAB — MRSA PCR SCREENING: MRSA by PCR: NEGATIVE

## 2019-11-12 LAB — TROPONIN I (HIGH SENSITIVITY)
Troponin I (High Sensitivity): 8449 ng/L (ref <18)
Troponin I (High Sensitivity): 10700 ng/L (ref <18)
Troponin I (High Sensitivity): 9411 ng/L (ref <18)

## 2019-11-12 LAB — HEMOGLOBIN A1C
Hgb A1c MFr Bld: 6.8 % — ABNORMAL HIGH (ref 4.8–5.6)
Mean Plasma Glucose: 148.46 mg/dL

## 2019-11-12 LAB — ECHOCARDIOGRAM COMPLETE
Height: 70 in
Weight: 2363.33 oz

## 2019-11-12 LAB — SARS CORONAVIRUS 2 BY RT PCR (HOSPITAL ORDER, PERFORMED IN ~~LOC~~ HOSPITAL LAB): SARS Coronavirus 2: NEGATIVE

## 2019-11-12 LAB — POCT I-STAT, CHEM 8
BUN: 9 mg/dL (ref 8–23)
Calcium, Ion: 1.2 mmol/L (ref 1.15–1.40)
Chloride: 101 mmol/L (ref 98–111)
Creatinine, Ser: 0.6 mg/dL — ABNORMAL LOW (ref 0.61–1.24)
Glucose, Bld: 157 mg/dL — ABNORMAL HIGH (ref 70–99)
HCT: 33 % — ABNORMAL LOW (ref 39.0–52.0)
Hemoglobin: 11.2 g/dL — ABNORMAL LOW (ref 13.0–17.0)
Potassium: 3.7 mmol/L (ref 3.5–5.1)
Sodium: 138 mmol/L (ref 135–145)
TCO2: 27 mmol/L (ref 22–32)

## 2019-11-12 LAB — APTT: aPTT: 34 seconds (ref 24–36)

## 2019-11-12 SURGERY — CORONARY/GRAFT ACUTE MI REVASCULARIZATION
Anesthesia: LOCAL

## 2019-11-12 MED ORDER — FENTANYL CITRATE (PF) 100 MCG/2ML IJ SOLN
INTRAMUSCULAR | Status: AC
  Filled 2019-11-12: qty 2

## 2019-11-12 MED ORDER — SODIUM CHLORIDE 0.9 % IV SOLN: INTRAVENOUS | Status: AC

## 2019-11-12 MED ORDER — SIMVASTATIN 20 MG PO TABS
20.0000 mg | ORAL_TABLET | Freq: Every day | ORAL | Status: DC
  Administered 2019-11-12: 20 mg via ORAL
  Filled 2019-11-12: qty 1

## 2019-11-12 MED ORDER — MIDAZOLAM HCL 2 MG/2ML IJ SOLN
INTRAMUSCULAR | Status: AC
  Filled 2019-11-12: qty 2

## 2019-11-12 MED ORDER — AMLODIPINE BESYLATE 5 MG PO TABS
5.0000 mg | ORAL_TABLET | Freq: Every day | ORAL | Status: DC
  Administered 2019-11-13: 09:00:00 5 mg via ORAL
  Filled 2019-11-12: qty 1

## 2019-11-12 MED ORDER — ACETAMINOPHEN 325 MG PO TABS
650.0000 mg | ORAL_TABLET | ORAL | Status: DC | PRN
  Administered 2019-11-12 – 2019-11-15 (×5): 650 mg via ORAL
  Filled 2019-11-12 (×5): qty 2

## 2019-11-12 MED ORDER — NITROGLYCERIN 1 MG/10 ML FOR IR/CATH LAB
INTRA_ARTERIAL | Status: DC | PRN
  Administered 2019-11-12: 200 ug via INTRACORONARY
  Administered 2019-11-12: 300 ug via INTRA_ARTERIAL
  Administered 2019-11-12 (×2): 200 ug via INTRACORONARY

## 2019-11-12 MED ORDER — CANGRELOR TETRASODIUM 50 MG IV SOLR
INTRAVENOUS | Status: AC
  Filled 2019-11-12: qty 50

## 2019-11-12 MED ORDER — LEVOTHYROXINE SODIUM 50 MCG PO TABS
50.0000 ug | ORAL_TABLET | Freq: Every day | ORAL | Status: DC
  Administered 2019-11-13 – 2019-11-16 (×4): 50 ug via ORAL
  Filled 2019-11-12 (×4): qty 1

## 2019-11-12 MED ORDER — ISOSORBIDE MONONITRATE ER 60 MG PO TB24
60.0000 mg | ORAL_TABLET | Freq: Every evening | ORAL | Status: DC
  Administered 2019-11-12 – 2019-11-15 (×4): 60 mg via ORAL
  Filled 2019-11-12 (×4): qty 1

## 2019-11-12 MED ORDER — TICAGRELOR 90 MG PO TABS
ORAL_TABLET | ORAL | Status: AC
  Filled 2019-11-12: qty 2

## 2019-11-12 MED ORDER — CANGRELOR BOLUS VIA INFUSION
INTRAVENOUS | Status: DC | PRN
  Administered 2019-11-12: 2010 ug via INTRAVENOUS

## 2019-11-12 MED ORDER — SODIUM CHLORIDE 0.9% FLUSH: 3.0000 mL | INTRAVENOUS | Status: DC | PRN

## 2019-11-12 MED ORDER — TICAGRELOR 90 MG PO TABS
ORAL_TABLET | ORAL | Status: DC | PRN
  Administered 2019-11-12: 180 mg via ORAL

## 2019-11-12 MED ORDER — PERFLUTREN LIPID MICROSPHERE
1.0000 mL | INTRAVENOUS | Status: AC | PRN
  Administered 2019-11-12: 2 mL via INTRAVENOUS
  Filled 2019-11-12: qty 10

## 2019-11-12 MED ORDER — SODIUM CHLORIDE 0.9 % IV SOLN
1.7500 mg/kg/h | INTRAVENOUS | Status: AC
  Filled 2019-11-12: qty 250

## 2019-11-12 MED ORDER — PANTOPRAZOLE SODIUM 40 MG PO TBEC
40.0000 mg | DELAYED_RELEASE_TABLET | Freq: Every day | ORAL | Status: DC
  Filled 2019-11-12: qty 1

## 2019-11-12 MED ORDER — SODIUM CHLORIDE 0.9 % IV SOLN: 250.0000 mL | INTRAVENOUS | Status: DC | PRN

## 2019-11-12 MED ORDER — HEPARIN (PORCINE) IN NACL 1000-0.9 UT/500ML-% IV SOLN
INTRAVENOUS | Status: DC | PRN
  Administered 2019-11-12 (×3): 500 mL

## 2019-11-12 MED ORDER — TIROFIBAN HCL IN NACL 5-0.9 MG/100ML-% IV SOLN
INTRAVENOUS | Status: AC | PRN
  Administered 2019-11-12: 0.15 ug/kg/min via INTRAVENOUS

## 2019-11-12 MED ORDER — SODIUM CHLORIDE 0.9 % IV SOLN
INTRAVENOUS | Status: AC | PRN
  Administered 2019-11-12: 10 mL/h via INTRAVENOUS

## 2019-11-12 MED ORDER — ASPIRIN 81 MG PO CHEW
CHEWABLE_TABLET | ORAL | Status: DC | PRN
  Administered 2019-11-12: 81 mg via ORAL

## 2019-11-12 MED ORDER — TICAGRELOR 90 MG PO TABS
90.0000 mg | ORAL_TABLET | Freq: Two times a day (BID) | ORAL | Status: DC
  Administered 2019-11-13 – 2019-11-16 (×8): 90 mg via ORAL
  Filled 2019-11-12 (×8): qty 1

## 2019-11-12 MED ORDER — SODIUM CHLORIDE 0.9% FLUSH
3.0000 mL | Freq: Two times a day (BID) | INTRAVENOUS | Status: DC
  Administered 2019-11-13 – 2019-11-16 (×7): 3 mL via INTRAVENOUS

## 2019-11-12 MED ORDER — HEPARIN (PORCINE) IN NACL 1000-0.9 UT/500ML-% IV SOLN
INTRAVENOUS | Status: AC
  Filled 2019-11-12: qty 500

## 2019-11-12 MED ORDER — ASPIRIN 81 MG PO CHEW: 81.0000 mg | CHEWABLE_TABLET | Freq: Every day | ORAL | Status: DC

## 2019-11-12 MED ORDER — BIVALIRUDIN BOLUS VIA INFUSION - CUPID
INTRAVENOUS | Status: DC | PRN
  Administered 2019-11-12: 50.25 mg via INTRAVENOUS

## 2019-11-12 MED ORDER — SODIUM CHLORIDE 0.9 % IV SOLN
INTRAVENOUS | Status: AC | PRN
  Administered 2019-11-12: 4 ug/kg/min via INTRAVENOUS

## 2019-11-12 MED ORDER — CHLORHEXIDINE GLUCONATE CLOTH 2 % EX PADS
6.0000 | MEDICATED_PAD | Freq: Every day | CUTANEOUS | Status: DC
  Administered 2019-11-13 – 2019-11-14 (×2): 6 via TOPICAL

## 2019-11-12 MED ORDER — FERROUS SULFATE 325 (65 FE) MG PO TABS
325.0000 mg | ORAL_TABLET | Freq: Every day | ORAL | Status: DC
  Administered 2019-11-13 – 2019-11-16 (×4): 325 mg via ORAL
  Filled 2019-11-12 (×4): qty 1

## 2019-11-12 MED ORDER — ASPIRIN 81 MG PO CHEW
CHEWABLE_TABLET | ORAL | Status: AC
  Filled 2019-11-12: qty 3

## 2019-11-12 MED ORDER — TIROFIBAN HCL IN NACL 5-0.9 MG/100ML-% IV SOLN
0.1500 ug/kg/min | INTRAVENOUS | Status: AC
  Administered 2019-11-12: 0.15 ug/kg/min via INTRAVENOUS
  Filled 2019-11-12: qty 100

## 2019-11-12 MED ORDER — LIDOCAINE HCL (PF) 1 % IJ SOLN
INTRAMUSCULAR | Status: DC | PRN
  Administered 2019-11-12: 15 mL

## 2019-11-12 MED ORDER — HYDRALAZINE HCL 20 MG/ML IJ SOLN: 10.0000 mg | INTRAMUSCULAR | Status: AC | PRN

## 2019-11-12 MED ORDER — TIROFIBAN HCL IN NACL 5-0.9 MG/100ML-% IV SOLN
INTRAVENOUS | Status: AC
  Filled 2019-11-12: qty 100

## 2019-11-12 MED ORDER — SODIUM CHLORIDE 0.9 % IV SOLN
INTRAVENOUS | Status: AC | PRN
  Administered 2019-11-12 (×2): 1.75 mg/kg/h via INTRAVENOUS

## 2019-11-12 MED ORDER — ASPIRIN 81 MG PO CHEW
CHEWABLE_TABLET | ORAL | Status: AC
  Filled 2019-11-12: qty 1

## 2019-11-12 MED ORDER — HEPARIN (PORCINE) IN NACL 1000-0.9 UT/500ML-% IV SOLN
INTRAVENOUS | Status: AC
  Filled 2019-11-12: qty 1000

## 2019-11-12 MED ORDER — BIVALIRUDIN TRIFLUOROACETATE 250 MG IV SOLR
INTRAVENOUS | Status: AC
  Filled 2019-11-12: qty 250

## 2019-11-12 MED ORDER — LIDOCAINE HCL (PF) 1 % IJ SOLN
INTRAMUSCULAR | Status: AC
  Filled 2019-11-12: qty 30

## 2019-11-12 MED ORDER — IOHEXOL 350 MG/ML SOLN
INTRAVENOUS | Status: DC | PRN
  Administered 2019-11-12: 205 mL

## 2019-11-12 MED ORDER — TIROFIBAN (AGGRASTAT) BOLUS VIA INFUSION
INTRAVENOUS | Status: DC | PRN
  Administered 2019-11-12: 1675 ug via INTRAVENOUS

## 2019-11-12 MED ORDER — ONDANSETRON HCL 4 MG/2ML IJ SOLN
4.0000 mg | Freq: Four times a day (QID) | INTRAMUSCULAR | Status: DC | PRN
  Administered 2019-11-13 (×3): 4 mg via INTRAVENOUS
  Filled 2019-11-12 (×3): qty 2

## 2019-11-12 MED ORDER — LABETALOL HCL 5 MG/ML IV SOLN: 10.0000 mg | INTRAVENOUS | Status: AC | PRN

## 2019-11-12 MED ORDER — FENTANYL CITRATE (PF) 100 MCG/2ML IJ SOLN
INTRAMUSCULAR | Status: DC | PRN
  Administered 2019-11-12: 25 ug via INTRAVENOUS

## 2019-11-12 MED ORDER — MIDAZOLAM HCL 2 MG/2ML IJ SOLN
INTRAMUSCULAR | Status: DC | PRN
  Administered 2019-11-12 (×3): 1 mg via INTRAVENOUS

## 2019-11-12 SURGICAL SUPPLY — 18 items
BALLN SAPPHIRE 2.5X15 (BALLOONS) ×2
BALLOON SAPPHIRE 2.5X15 (BALLOONS) IMPLANT
CATH EXTRAC PRONTO 5.5F 138CM (CATHETERS) ×1 IMPLANT
CATH INFINITI 5 FR IM (CATHETERS) ×1 IMPLANT
CATH INFINITI 5FR JL4 (CATHETERS) ×1 IMPLANT
CATH INFINITI JR4 5F (CATHETERS) ×1 IMPLANT
CATHETER LAUNCHER 6FR RCB (CATHETERS) ×1 IMPLANT
KIT ENCORE 26 ADVANTAGE (KITS) ×1 IMPLANT
KIT HEART LEFT (KITS) ×2 IMPLANT
PACK CARDIAC CATHETERIZATION (CUSTOM PROCEDURE TRAY) ×2 IMPLANT
SHEATH PINNACLE 5F 10CM (SHEATH) ×1 IMPLANT
SHEATH PINNACLE 6F 10CM (SHEATH) ×1 IMPLANT
STENT RESOLUTE ONYX 3.5X38 (Permanent Stent) ×1 IMPLANT
TRANSDUCER W/STOPCOCK (MISCELLANEOUS) ×2 IMPLANT
TUBING CIL FLEX 10 FLL-RA (TUBING) ×2 IMPLANT
WIRE COUGAR XT STRL 190CM (WIRE) ×1 IMPLANT
WIRE EMERALD 3MM-J .035X150CM (WIRE) ×1 IMPLANT
WIRE EMERALD 3MM-J .035X260CM (WIRE) ×1 IMPLANT

## 2019-11-12 NOTE — Progress Notes (Signed)
Pt had large bloody bowel movement. Blood was bright red and mixed in with the stool. No external sources of bleeding seen. PA notified.

## 2019-11-12 NOTE — Progress Notes (Signed)
Chaplain responded to page for STEMI. Kenneth [name] was taken straight to the cath lab. Family was not yet at [facility]. Chaplain was not needed at this time. Chaplain remains available as needs arise.   Chaplain Resident, Evelene Croon, [place] [phone]

## 2019-11-12 NOTE — Progress Notes (Signed)
Nurse noted large clot in BM. Called GI for evaluation.

## 2019-11-12 NOTE — Progress Notes (Signed)
  Echocardiogram 2D Echocardiogram has been performed.  Kenneth [name] 11/12/2019, 3:43 PM

## 2019-11-12 NOTE — Care Management (Signed)
[employer] benefits check sent and pending.  Jaxsen Bernhart MSN, RN, NCM-BC, ACM-RN [phone] 

## 2019-11-12 NOTE — H&P (Addendum)
Cardiology Admission History and Physical:   Patient ID: Kenneth [name] MRN: [id number]; DOB: 03/17/1928   Admission date: 11/12/2019  Primary Care Provider: Lavone Orn, MD Primary Cardiologist: Sinclair Grooms, MD  Chief Complaint:  fatigue  Patient Profile:   Kenneth [name] is a 85 y.o. male with history of CAD s/p remote CABG, hypothyroidism, hypertension, hyperlipidemia, diabetes mellitus and GI bleed presented by EMS as a code STEMI.  Echocardiogram March 2019 showed LV function of 60 to 65% and grade 1 diastolic dysfunction.  Patient was last seen by Truitt Merle August 2020.  He was doing relatively well.  History of Present Illness:   Kenneth [name] most recently admitted 11/27-11/29/20 with diverticulitis and large hiatal hernia.[name]  He initially presented with left lower quadrant pain, anorexia and vomiting.  Prior history of colectomy 2 to 3 years ago.  He was treated conservatively with antibiotic.  Plan for surgical consultation versus GI referral as outpatient if reoccurrence.  He was doing relatively well but not able to tolerate his antibiotic (metronidazole and ofloxacin).  He has intermittent nausea and abdominal pain.  Per daughter he stopped his antibiotic 3 days ago 12/4.  Yesterday patient had severe fatigue, nausea and intermittent shortness of breath.  His symptoms worsened today and EMS was called and found to have inferior STEMI.  He was brought directly to Cath Lab.  Underwent stenting to his RCA.  There was a delay in the PCI as Dr. Claiborne Billings needed to discuss goal of care with family and high decision making with his regular cardiologist Dr. Tamala Julian.  Heart Pathway Score:     Past Medical History:  Diagnosis Date  . Anemia    on iron  . Anxiety   . Arthritis   . Basal cell carcinoma    left ear  . CAD (coronary artery disease)    a. CAD s/p CABG 1993.  [name] Chronic cough   . Chronic rhinitis   . Complication of anesthesia   . Diabetes ([place])   .  Diverticulosis of colon   . GERD (gastroesophageal reflux disease)   . Glaucoma   . H/O hiatal hernia   . Hernia    left side  . History of gout   . History of kidney stones   . HTN (hypertension)   . Hyperlipidemia `  . Melanoma of skin, site unspecified   . PONV (postoperative nausea and vomiting)   . [facility] spotted fever 2-3 yrs ago  . Thyroid disease     Past Surgical History:  Procedure Laterality Date  . COLONOSCOPY WITH PROPOFOL N/A 11/27/2013   Procedure: COLONOSCOPY WITH PROPOFOL;  Surgeon: Garlan Fair, MD;  Location: [facility];  Service: Endoscopy;  Laterality: N/A;  . COLOSTOMY TAKEDOWN N/A 03/16/2016   Procedure: COLOSTOMY TAKEDOWN;  Surgeon: Excell Seltzer, MD;  Location: [facility];  Service: General;  Laterality: N/A;  . CORONARY ARTERY BYPASS GRAFT  1990's   x 3  . HERNIA REPAIR  1970's,17   lft ing hernia  . INGUINAL HERNIA REPAIR Left 06/28/2014   Procedure: Left  INGUINAL HERNIA REPAIR ;  Surgeon: Merrie Roof, MD;  Location: [facility];  Service: General;  Laterality: Left;  . INSERTION OF MESH Left 06/28/2014   Procedure: INSERTION OF MESH;  Surgeon: Merrie Roof, MD;  Location: [facility];  Service: General;  Laterality: Left;  . INSERTION OF MESH N/A 10/11/2014  . LAPAROSCOPY  10/11/2014  . LAPAROTOMY N/A 09/11/2015  Procedure: EXPLORATORY LAPAROTOMY;  Surgeon: Excell Seltzer, MD;  Location: [facility];  Service: General;  Laterality: N/A;  . MASS EXCISION Left 03/21/2017   Procedure: EXCISION BASAL CELL CARCINOMA LEFT EAR;  Surgeon: Leta Baptist, MD;  Location: [place];  Service: ENT;  Laterality: Left;  . PARTIAL COLECTOMY N/A 09/11/2015   Procedure: SIGMOID COLECTOMY AND COLOSTOMY;  Surgeon: Excell Seltzer, MD;  Location: [facility];  Service: General;  Laterality: N/A;  . right leg fracture age 26    . SKIN FULL THICKNESS GRAFT Left 03/21/2017   Procedure: FULL THICKNESS SKIN GRAFT TO LEFT EAR;  Surgeon: Leta Baptist, MD;  Location: [place];  Service: ENT;  Laterality: Left;  . TONSILLECTOMY  age 34 or 68  . VENTRAL HERNIA REPAIR  10/11/2014   Procedure: HERNIA REPAIR VENTRAL ADULT;  Surgeon: Autumn Messing III, MD;  Location: [place];  Service: General;;     Medications Prior to Admission: Prior to Admission medications   Medication Sig Start Date End Date Taking? Authorizing Provider  acetaminophen (TYLENOL) 325 MG tablet Take 325 mg by mouth every 6 (six) hours as needed for mild pain.     [provider]  amLODipine (NORVASC) 5 MG tablet Take 5 mg by mouth daily. 01/17/18   [provider]  Artificial Tear Solution (SOOTHE XP OP) Apply 1 drop to eye as needed (dry eyes).     [provider]  ciprofloxacin (CIPRO) 500 MG tablet Take 1 tablet (500 mg total) by mouth 2 (two) times daily for 15 doses. 11/04/19 11/12/19  Alma Friendly, MD  Cyanocobalamin (VITAMIN B-12 PO) Take 1 tablet by mouth daily.    [provider]  ferrous sulfate 325 (65 FE) MG tablet Take 325 mg by mouth daily with breakfast.    [provider]  hydrochlorothiazide (HYDRODIURIL) 25 MG tablet Take 12.5 mg by mouth daily.    [provider]  isosorbide mononitrate (IMDUR) 60 MG 24 hr tablet Take 60 mg by mouth every evening.     [provider]  latanoprost (XALATAN) 0.005 % ophthalmic solution Place 1 drop into both eyes at bedtime.    [provider]  levothyroxine (SYNTHROID, LEVOTHROID) 75 MCG tablet Take 50 mcg by mouth daily before breakfast.     [provider]  metFORMIN (GLUCOPHAGE) 1000 MG tablet Take 500 mg by mouth daily with breakfast.     [provider]  metoprolol succinate (TOPROL-XL) 50 MG 24 hr tablet TAKE ONE-HALF (1/2) TABLET BY MOUTH DAILY WITH OR AFTER A MEAL Patient taking differently: Take 25 mg by mouth daily. WITH OR AFTER A MEAL 10/29/19   Belva Crome, MD  metroNIDAZOLE (FLAGYL) 500 MG tablet Take 1 tablet (500 mg total) by  mouth every 8 (eight) hours for 22 doses. 11/04/19 11/12/19  Alma Friendly, MD  Multiple Vitamin (MULTIVITAMIN WITH MINERALS) TABS tablet Take 1 tablet by mouth daily.    [provider]  ondansetron (ZOFRAN) 4 MG tablet Take 1 tablet (4 mg total) by mouth every 6 (six) hours as needed for nausea. 11/04/19   Alma Friendly, MD  pantoprazole (PROTONIX) 40 MG tablet Take 40 mg by mouth daily.    [provider]  potassium chloride (K-DUR,KLOR-CON) 10 MEQ tablet Take 10 mEq by mouth every Monday, Wednesday, and Friday.  06/27/18   [provider]  simvastatin (ZOCOR) 20 MG tablet Take 20 mg by mouth every evening.    [provider]     Allergies:    Allergies  Allergen Reactions  . Lasix [Furosemide] Other (See Comments)    aches  . Spironolactone Other (See Comments)    Red, puffy, eyes, almost hospitalized  . Aspirin Other (See Comments)    Bleeding.    . Lipitor [Atorvastatin] Other (See Comments)    tired  . Lopressor [Metoprolol Tartrate] Other (See Comments)    Sleepy all the time 11.27.2020 Patient is currently taking Lopressor.  [name] Penicillins Hives and Rash    Has patient had a PCN reaction causing immediate rash, facial/tongue/throat swelling, SOB or lightheadedness with hypotension: No Has patient had a PCN reaction causing severe rash involving mucus membranes or skin necrosis: No Has patient had a PCN reaction that required hospitalization No Has patient had a PCN reaction occurring within the last 10 years: Yes If all of the above answers are "NO", then may proceed with Cephalosporin use.     Social History:   Social History   Socioeconomic History  . Marital status: Married    Spouse name: Not on file  . Number of children: Not on file  . Years of education: Not on file  . Highest education level: Not on file  Occupational History  . Not on file  Social Needs  . Financial resource strain: Not on file  . Food  insecurity    Worry: Not on file    Inability: Not on file  . Transportation needs    Medical: Not on file    Non-medical: Not on file  Tobacco Use  . Smoking status: Former Smoker    Types: Cigars  . Smokeless tobacco: Never Used  Substance and Sexual Activity  . Alcohol use: Yes    Comment: RARE  . Drug use: No    Comment: occ cigars  . Sexual activity: Not on file  Lifestyle  . Physical activity    Days per week: Not on file    Minutes per session: Not on file  . Stress: Not on file  Relationships  . Social Herbalist on phone: Not on file    Gets together: Not on file    Attends religious service: Not on file    Active member of club or organization: Not on file    Attends meetings of clubs or organizations: Not on file    Relationship status: Not on file  . Intimate partner violence    Fear of current or ex partner: Not on file    Emotionally abused: Not on file    Physically abused: Not on file    Forced sexual activity: Not on file  Other Topics Concern  . Not on file  Social History Narrative  . Not on file    Family History:   The patient's family history includes CAD in his father; Cancer in his mother; Hyperlipidemia in his father.    ROS:  Please see the history of present illness.  All other ROS reviewed and negative.     Physical Exam/Data:   Vitals:   11/12/19 1300  BP: 128/76  Pulse: (!) 109  Resp: 17  SpO2: 92%   No intake or output data in the 24 hours ending 11/12/19 1318 Last 3 Weights 11/02/2019 11/02/2019 11/02/2019  Weight (lbs) 147 lb 11.3 oz (No Data) 158 lb 11.7 oz  Weight (kg) 67 kg (No Data) 72 kg     There is no height or weight on file to  calculate BMI.  General: Ill-appearing elderly male HEENT: normal Lymph: no adenopathy Neck: no JVD Endocrine:  No thryomegaly Vascular: No carotid bruits; FA pulses 2+ bilaterally without bruits  Cardiac:  normal S1, S2; irregular  Lungs:  clear to auscultation bilaterally,  no wheezing, rhonchi or rales  Abd: soft, nontender, no hepatomegaly  Ext: no edema Musculoskeletal:  No deformities, BUE and BLE strength normal and equal Skin: warm and dry  Neuro:  CNs 2-12 intact, no focal abnormalities noted Psych: Somewhat lethargic after pain medication during cath   EKG:  The ECG that was done today was personally reviewed and demonstrates sinus rhythm with PAC and inferior ST elevation  Relevant CV Studies:  2D Doppler echocardiogram March 2019: Study Conclusions  - Left ventricle: The cavity size was normal. Systolic function was normal. The estimated ejection fraction was in the range of 60% to 65%. Wall motion was normal; there were no regional wall motion abnormalities. Doppler parameters are consistent with abnormal left ventricular relaxation (grade 1 diastolic dysfunction). Doppler parameters are consistent with elevated ventricular end-diastolic filling pressure. - Aortic valve: Sclerosis without stenosis. Transvalvular velocity was within the normal range. There was no stenosis. There was no regurgitation. - Mitral valve: There was mild regurgitation. - Left atrium: The atrium was normal in size. - Right ventricle: The cavity size was normal. Wall thickness was normal. Systolic function was normal. - Tricuspid valve: There was mild regurgitation. - Pulmonary arteries: Systolic pressure was within the normal range.  Laboratory Data:  High Sensitivity Troponin:   Recent Labs  Lab 11/12/19 1045  TROPONINIHS 8,449*      Chemistry Recent Labs  Lab 11/12/19 1045  NA 137  K 3.8  CL 104  CO2 24  GLUCOSE 156*  BUN 10  CREATININE 0.79  CALCIUM 8.2*  GFRNONAA >60  GFRAA >60  ANIONGAP 9    Recent Labs  Lab 11/12/19 1045  PROT 5.6*  ALBUMIN 2.2*  AST 22  ALT 20  ALKPHOS 63  BILITOT 0.6   Hematology Recent Labs  Lab 11/12/19 1045  WBC 11.4*  RBC 3.49*  HGB 11.0*  HCT 33.8*  MCV 96.8  MCH 31.5  MCHC  32.5  RDW 13.4  PLT 380   BNPNo results for input(s): BNP, PROBNP in the last 168 hours.  DDimer No results for input(s): DDIMER in the last 168 hours.   Radiology/Studies:  No results found.  Assessment and Plan:   1. Inferior STEMI Status post PCI to his RCA. Hs-troponin [id number]. Pending final cath report. DAPT with ASA and Brillinta. On statin and Imdur.   2. HTN - On amlodipine  And Imdur  3. DM - Will place SSI -Hold Metformin  4. HLD - 11/12/2019: Cholesterol 94; HDL 25; LDL Cholesterol 50; Triglycerides 93; VLDL 19  - On Zocor 20  5.  Recent diverticulitis with prior hx of colostomy  -He stopped his antibiotic 12/4.  He repeatedly saying that " I do not want to take antibiotic for diverticulitis".  Per daughter, he has intermittent abdominal pain.  -Unable to evaluate his current status as he is lethargic. -He may need internal medicine versus GI consult tomorrow depending on his symptoms.  Severity of Illness: The appropriate patient status for this patient is INPATIENT. Inpatient status is judged to be reasonable and necessary in order to provide the required intensity of service to ensure the patient's safety. The patient's presenting symptoms, physical exam findings, and initial radiographic and laboratory data in the context  of their chronic comorbidities is felt to place them at high risk for further clinical deterioration. Furthermore, it is not anticipated that the patient will be medically stable for discharge from the hospital within 2 midnights of admission. The following factors support the patient status of inpatient.   " The patient's presenting symptoms include Fatigue and SOB. " The worrisome physical exam findings includeN/A " The initial radiographic and laboratory data are worrisome because of STEMI " The chronic co-morbidities include age, HTN, recent diverticulitis   * I certify that at the point of admission it is my clinical judgment that the patient  will require inpatient hospital care spanning beyond 2 midnights from the point of admission due to high intensity of service, high risk for further deterioration and high frequency of surveillance required.*    For questions or updates, please contact [facility] [place] Please consult [email or web address] for contact info under        Jarrett Soho, PA  11/12/2019 1:18 PM    Patient seen and examined. Agree with assessment and plan.  Kenneth [name] is an 85 year old gentleman who is followed by Dr. Tamala Julian and he is status post remote CABG revascularization surgery by Dr. Pia Mau with a LIMA to his LAD, SVG to obtuse marginal vessel and SVG to RCA in 1993.  He has had issues with significant diverticular disease and required colectomy 2 to 3 years ago.  At that time he was told not to take aspirin therapy.  He was recently hospitalized with diverticulitis and a large hiatal hernia treated with antibiotics.  He had developed nausea intermittent shortness of breath fatigue worsening symptoms today.  EMS was contacted and  ECG revealed inferior ST elevation with precordial ST segment depression which is new compared to his ECG while hospitalized last week.  The patient was not willing to take aspirin at the start of the procedure.  We discussed the need for catheterization and possible anticoagulation therapy.  The procedure, Dr. Tamala Julian was also notified to speak with the patient since he had followed him for many years.  Emergent catheterization and intervention was subsequently performed.   Troy Sine, MD, [facility] 11/12/2019 7:00 PM

## 2019-11-12 NOTE — TOC Benefit Eligibility Note (Signed)
Transition of Care [facility]) Benefit Eligibility Note    Patient Details  Name: Kenneth [name] MRN: [id number] Date of Birth: 1928-11-01   Medication/Dose: [name] 57m twice daily.  No  generic  Covered?: Yes  Tier: (Not Available)  Prescription Coverage Preferred [facility] [address] Spoke with Person/Company/Phone Number:: [employer]/ [phone] Co-Pay: 47.00 for a 30 day supply / 118.00 for a 90 day supply Mail Order  Prior Approval: No  Deductible: Met       IOrbie PyoPhone Number: 11/12/2019, 4:34 PM

## 2019-11-13 ENCOUNTER — Inpatient Hospital Stay (HOSPITAL_COMMUNITY): Payer: Medicare Other

## 2019-11-13 ENCOUNTER — Encounter (HOSPITAL_COMMUNITY): Payer: Self-pay | Admitting: Cardiovascular Disease

## 2019-11-13 DIAGNOSIS — I2129 ST elevation (STEMI) myocardial infarction involving other sites: Secondary | ICD-10-CM

## 2019-11-13 LAB — BASIC METABOLIC PANEL
Anion gap: 8 (ref 5–15)
BUN: 14 mg/dL (ref 8–23)
CO2: 23 mmol/L (ref 22–32)
Calcium: 7.8 mg/dL — ABNORMAL LOW (ref 8.9–10.3)
Chloride: 105 mmol/L (ref 98–111)
Creatinine, Ser: 0.76 mg/dL (ref 0.61–1.24)
GFR calc Af Amer: >60 mL/min (ref >60)
GFR calc non Af Amer: >60 mL/min (ref >60)
Glucose, Bld: 192 mg/dL — ABNORMAL HIGH (ref 70–99)
Potassium: 3.4 mmol/L — ABNORMAL LOW (ref 3.5–5.1)
Sodium: 136 mmol/L (ref 135–145)

## 2019-11-13 LAB — GLUCOSE, CAPILLARY
Glucose-Capillary: 170 mg/dL — ABNORMAL HIGH (ref 70–99)
Glucose-Capillary: 79 mg/dL (ref 70–99)
Glucose-Capillary: 90 mg/dL (ref 70–99)
Glucose-Capillary: 187 mg/dL — ABNORMAL HIGH (ref 70–99)
Glucose-Capillary: 150 mg/dL — ABNORMAL HIGH (ref 70–99)

## 2019-11-13 LAB — CBC
HCT: 28.9 % — ABNORMAL LOW (ref 39.0–52.0)
Hemoglobin: 9.1 g/dL — ABNORMAL LOW (ref 13.0–17.0)
MCH: 31.5 pg (ref 26.0–34.0)
MCHC: 31.5 g/dL (ref 30.0–36.0)
MCV: 100 fL (ref 80.0–100.0)
Platelets: 378 10*3/uL (ref 150–400)
RBC: 2.89 MIL/uL — ABNORMAL LOW (ref 4.22–5.81)
RDW: 13.5 % (ref 11.5–15.5)
WBC: 10.5 10*3/uL (ref 4.0–10.5)
nRBC: 0 % (ref 0.0–0.2)

## 2019-11-13 LAB — POCT ACTIVATED CLOTTING TIME
Activated Clotting Time: 158 seconds
Activated Clotting Time: 164 seconds

## 2019-11-13 MED ORDER — IOHEXOL 350 MG/ML SOLN
100.0000 mL | Freq: Once | INTRAVENOUS | Status: AC | PRN
  Administered 2019-11-13: 100 mL via INTRAVENOUS

## 2019-11-13 MED ORDER — ASPIRIN 81 MG PO CHEW
81.0000 mg | CHEWABLE_TABLET | Freq: Every day | ORAL | Status: DC
  Administered 2019-11-13 – 2019-11-15 (×3): 81 mg via ORAL
  Filled 2019-11-13 (×3): qty 1

## 2019-11-13 MED ORDER — POTASSIUM CHLORIDE CRYS ER 10 MEQ PO TBCR
40.0000 meq | EXTENDED_RELEASE_TABLET | Freq: Once | ORAL | Status: AC
  Administered 2019-11-13: 40 meq via ORAL
  Filled 2019-11-13: qty 4

## 2019-11-13 MED ORDER — ROSUVASTATIN CALCIUM 20 MG PO TABS
20.0000 mg | ORAL_TABLET | Freq: Every day | ORAL | Status: DC
  Administered 2019-11-13 – 2019-11-15 (×3): 20 mg via ORAL
  Filled 2019-11-13 (×3): qty 1

## 2019-11-13 MED ORDER — INSULIN ASPART 100 UNIT/ML ~~LOC~~ SOLN
0.0000 [IU] | Freq: Three times a day (TID) | SUBCUTANEOUS | Status: DC
  Administered 2019-11-13: 3 [IU] via SUBCUTANEOUS
  Administered 2019-11-14: 2 [IU] via SUBCUTANEOUS
  Administered 2019-11-14: 3 [IU] via SUBCUTANEOUS
  Administered 2019-11-15: 2 [IU] via SUBCUTANEOUS
  Administered 2019-11-15: 8 [IU] via SUBCUTANEOUS
  Administered 2019-11-16: 3 [IU] via SUBCUTANEOUS

## 2019-11-13 MED ORDER — PANTOPRAZOLE SODIUM 40 MG IV SOLR
40.0000 mg | Freq: Two times a day (BID) | INTRAVENOUS | Status: DC
  Administered 2019-11-13 – 2019-11-16 (×7): 40 mg via INTRAVENOUS
  Filled 2019-11-13 (×7): qty 40

## 2019-11-13 NOTE — Progress Notes (Signed)
EKG CRITICAL VALUE     12 lead EKG performed.  Critical value noted.  Leafy Kindle, RN notified.   Asencion Gowda, CCT 11/13/2019 7:18 AM

## 2019-11-13 NOTE — Plan of Care (Signed)
Stable during shift. Trended ACTs for several hours. R femoral arterial sheath pulled, no complications, compliant with bedrest/restrictions. On Brilinta.   Large, loose, black stool, no bright red blood. Hx of diverticulitis, awaiting GI consult. Hgb stable 10.0 to 9.1  VSS. Back pain from lying in bed, responded well to repositioning/tylenol and heat packs. Repositioning q 2 hr for comfort and skin integrity.   Problem: Education: Goal: Knowledge of General Education information will improve Description: Including pain rating scale, medication(s)/side effects and non-pharmacologic comfort measures Outcome: Progressing   Problem: Clinical Measurements: Goal: Ability to maintain clinical measurements within normal limits will improve Outcome: Progressing Goal: Will remain free from infection Outcome: Progressing Goal: Cardiovascular complication will be avoided Outcome: Progressing   Problem: Coping: Goal: Level of anxiety will decrease Outcome: Progressing   Problem: Safety: Goal: Ability to remain free from injury will improve Outcome: Progressing   Problem: Skin Integrity: Goal: Risk for impaired skin integrity will decrease Outcome: Progressing   Problem: Education: Goal: Understanding of cardiac disease, CV risk reduction, and recovery process will improve Outcome: Progressing

## 2019-11-13 NOTE — Consult Note (Signed)
Referring Provider:  Dr. Marcello Moores Primary Care Physician:  Lavone Orn, MD Primary Gastroenterologist:  Sadie Haber   Reason for Consultation:  GI bleed  HPI: Kenneth [name] is a 86 y.o. male with past medical history of complicated diverticulitis requiring sigmoid colectomy in 2016, recent admission for diverticulitis earlier this month presented to the hospital with fatigue.  Was found to have STEMI and underwent PCI.  Currently on dual antiplatelet therapy.  Subsequently noted rectal bleeding.  GI is consulted for further evaluation.  Patient seen and examined at bedside.  According to patient, after being discharged 10 days ago he did not completed his course of antibiotics.  He continues to have left lower quadrant abdominal pain.  Complaining of nausea but denies any vomiting.  Discussed with RN patient had bloody bowel movement yesterday and overnight which was bright red as well as dark in color.  Past Medical History:  Diagnosis Date  . Anemia    on iron  . Anxiety   . Arthritis   . Basal cell carcinoma    left ear  . CAD (coronary artery disease)    a. CAD s/p CABG 1993.  [name] Chronic cough   . Chronic rhinitis   . Complication of anesthesia   . Diabetes ([place])   . Diverticulosis of colon   . GERD (gastroesophageal reflux disease)   . Glaucoma   . H/O hiatal hernia   . Hernia    left side  . History of gout   . History of kidney stones   . HTN (hypertension)   . Hyperlipidemia `  . Melanoma of skin, site unspecified   . PONV (postoperative nausea and vomiting)   . [facility] spotted fever 2-3 yrs ago  . Thyroid disease     Past Surgical History:  Procedure Laterality Date  . COLONOSCOPY WITH PROPOFOL N/A 11/27/2013   Procedure: COLONOSCOPY WITH PROPOFOL;  Surgeon: Garlan Fair, MD;  Location: [facility];  Service: Endoscopy;  Laterality: N/A;  . COLOSTOMY TAKEDOWN N/A 03/16/2016   Procedure: COLOSTOMY TAKEDOWN;  Surgeon: Excell Seltzer, MD;  Location: [facility]  [facility];  Service: General;  Laterality: N/A;  . CORONARY ARTERY BYPASS GRAFT  1990's   x 3  . CORONARY/GRAFT ACUTE MI REVASCULARIZATION N/A 11/12/2019   Procedure: CORONARY/GRAFT ACUTE MI REVASCULARIZATION;  Surgeon: Troy Sine, MD;  Location: [place] CV LAB;  Service: Cardiovascular;  Laterality: N/A;  . HERNIA REPAIR  1970's,17   lft ing hernia  . INGUINAL HERNIA REPAIR Left 06/28/2014   Procedure: Left  INGUINAL HERNIA REPAIR ;  Surgeon: Merrie Roof, MD;  Location: [facility]S;  Service: General;  Laterality: Left;  . INSERTION OF MESH Left 06/28/2014   Procedure: INSERTION OF MESH;  Surgeon: Merrie Roof, MD;  Location: [facility];  Service: General;  Laterality: Left;  . INSERTION OF MESH N/A 10/11/2014  . LAPAROSCOPY  10/11/2014  . LAPAROTOMY N/A 09/11/2015   Procedure: EXPLORATORY LAPAROTOMY;  Surgeon: Excell Seltzer, MD;  Location: [facility];  Service: General;  Laterality: N/A;  . MASS EXCISION Left 03/21/2017   Procedure: EXCISION BASAL CELL CARCINOMA LEFT EAR;  Surgeon: Leta Baptist, MD;  Location: [place];  Service: ENT;  Laterality: Left;  . PARTIAL COLECTOMY N/A 09/11/2015   Procedure: SIGMOID COLECTOMY AND COLOSTOMY;  Surgeon: Excell Seltzer, MD;  Location: [facility];  Service: General;  Laterality: N/A;  . right leg fracture age 62    . SKIN FULL THICKNESS GRAFT Left  03/21/2017   Procedure: FULL THICKNESS SKIN GRAFT TO LEFT EAR;  Surgeon: Leta Baptist, MD;  Location: [place];  Service: ENT;  Laterality: Left;  . TONSILLECTOMY  age 15 or 61  . VENTRAL HERNIA REPAIR  10/11/2014   Procedure: HERNIA REPAIR VENTRAL ADULT;  Surgeon: Autumn Messing III, MD;  Location: [place];  Service: General;;    Prior to Admission medications   Medication Sig Start Date End Date Taking? Authorizing Provider  acetaminophen (TYLENOL) 325 MG tablet Take 325 mg by mouth every 6 (six) hours as needed for mild pain.     [provider]  amLODipine (NORVASC) 5 MG tablet Take 5  mg by mouth daily. 01/17/18   [provider]  Artificial Tear Solution (SOOTHE XP OP) Apply 1 drop to eye as needed (dry eyes).     [provider]  Cyanocobalamin (VITAMIN B-12 PO) Take 1 tablet by mouth daily.    [provider]  ferrous sulfate 325 (65 FE) MG tablet Take 325 mg by mouth daily with breakfast.    [provider]  hydrochlorothiazide (HYDRODIURIL) 25 MG tablet Take 12.5 mg by mouth daily.    [provider]  isosorbide mononitrate (IMDUR) 60 MG 24 hr tablet Take 60 mg by mouth every evening.     [provider]  latanoprost (XALATAN) 0.005 % ophthalmic solution Place 1 drop into both eyes at bedtime.    [provider]  levothyroxine (SYNTHROID, LEVOTHROID) 75 MCG tablet Take 50 mcg by mouth daily before breakfast.     [provider]  metFORMIN (GLUCOPHAGE) 1000 MG tablet Take 500 mg by mouth daily with breakfast.     [provider]  metoprolol succinate (TOPROL-XL) 50 MG 24 hr tablet TAKE ONE-HALF (1/2) TABLET BY MOUTH DAILY WITH OR AFTER A MEAL Patient taking differently: Take 25 mg by mouth daily. WITH OR AFTER A MEAL 10/29/19   Belva Crome, MD  Multiple Vitamin (MULTIVITAMIN WITH MINERALS) TABS tablet Take 1 tablet by mouth daily.    [provider]  ondansetron (ZOFRAN) 4 MG tablet Take 1 tablet (4 mg total) by mouth every 6 (six) hours as needed for nausea. 11/04/19   Alma Friendly, MD  pantoprazole (PROTONIX) 40 MG tablet Take 40 mg by mouth daily.    [provider]  potassium chloride (K-DUR,KLOR-CON) 10 MEQ tablet Take 10 mEq by mouth every Monday, Wednesday, and Friday.  06/27/18   [provider]  simvastatin (ZOCOR) 20 MG tablet Take 20 mg by mouth every evening.    [provider]    Scheduled Meds: . amLODipine  5 mg Oral Daily  . aspirin  81 mg Oral Daily  . Chlorhexidine Gluconate Cloth  6 each Topical Daily  . ferrous sulfate  325 mg  Oral Q breakfast  . isosorbide mononitrate  60 mg Oral QPM  . levothyroxine  50 mcg Oral QAC breakfast  . pantoprazole  40 mg Oral Daily  . potassium chloride  40 mEq Oral Once  . simvastatin  20 mg Oral q1800  . sodium chloride flush  3 mL Intravenous Q12H  . ticagrelor  90 mg Oral BID   Continuous Infusions: . sodium chloride     PRN Meds:.sodium chloride, acetaminophen, ondansetron (ZOFRAN) IV, sodium chloride flush  Allergies as of 11/12/2019 - Review Complete 11/12/2019  Allergen Reaction Noted  . Lasix [furosemide] Other (See Comments) 03/13/2018  . Spironolactone Other (See Comments) 03/13/2018  . Aspirin  Other (See Comments) 06/26/2014  . Lipitor [atorvastatin] Other (See Comments) 09/27/2013  . Lopressor [metoprolol tartrate] Other (See Comments) 09/27/2013  . Penicillins Hives and Rash 09/27/2013    Family History  Problem Relation Age of Onset  . CAD Father   . Hyperlipidemia Father   . Cancer Mother     Social History   Socioeconomic History  . Marital status: Married    Spouse name: Not on file  . Number of children: Not on file  . Years of education: Not on file  . Highest education level: Not on file  Occupational History  . Not on file  Social Needs  . Financial resource strain: Not on file  . Food insecurity    Worry: Not on file    Inability: Not on file  . Transportation needs    Medical: Not on file    Non-medical: Not on file  Tobacco Use  . Smoking status: Former Smoker    Types: Cigars  . Smokeless tobacco: Never Used  Substance and Sexual Activity  . Alcohol use: Yes    Comment: RARE  . Drug use: No    Comment: occ cigars  . Sexual activity: Not on file  Lifestyle  . Physical activity    Days per week: Not on file    Minutes per session: Not on file  . Stress: Not on file  Relationships  . Social Herbalist on phone: Not on file    Gets together: Not on file    Attends religious service: Not on file    Active  member of club or organization: Not on file    Attends meetings of clubs or organizations: Not on file    Relationship status: Not on file  . Intimate partner violence    Fear of current or ex partner: Not on file    Emotionally abused: Not on file    Physically abused: Not on file    Forced sexual activity: Not on file  Other Topics Concern  . Not on file  Social History Narrative  . Not on file    Review of Systems: Review of Systems  Constitutional: Positive for malaise/fatigue. Negative for chills and fever.  HENT: Positive for hearing loss. Negative for tinnitus.   Eyes: Negative for blurred vision and double vision.  Respiratory: Positive for shortness of breath. Negative for cough.   Cardiovascular: Positive for chest pain. Negative for palpitations.  Gastrointestinal: Positive for abdominal pain, blood in stool and nausea. Negative for heartburn and vomiting.  Genitourinary: Negative for dysuria and urgency.  Musculoskeletal: Positive for back pain, falls and joint pain.  Skin: Negative for itching and rash.  Neurological: Negative for seizures and loss of consciousness.  Endo/Heme/Allergies: Does not bruise/bleed easily.  Psychiatric/Behavioral: Negative for hallucinations and substance abuse.    Physical Exam: Vital signs: Vitals:   11/13/19 0600 11/13/19 0700  BP: 94/68 115/66  Pulse: 100 (!) 140  Resp: 20 20  Temp:  98.2 F (36.8 C)  SpO2: 99% 95%   Last BM Date: 11/12/19 Physical Exam  Constitutional: He is oriented to person, place, and time. He appears well-developed and well-nourished. No distress.  HENT:  Head: Normocephalic.  Eyes: EOM are normal. No scleral icterus.  Neck: Normal range of motion. Neck supple.  Cardiovascular: Regular rhythm.  Tachycardia  Pulmonary/Chest: Effort normal and breath sounds normal. No respiratory distress.  Anterior exam only  Abdominal: Soft. Bowel sounds are normal. He exhibits no distension.  There is abdominal  tenderness. There is no rebound and no guarding.  Left lower quadrant tenderness to palpation  Genitourinary:    Genitourinary Comments: Rectal exam showed no external hemorrhoids, patient was not cooperating with rectal exam but no gross rectal mass was felt.  No blood on the finger.   Musculoskeletal: Normal range of motion.        General: No edema.  Neurological: He is alert and oriented to person, place, and time.  Skin: Skin is warm. No erythema.  Psychiatric: He has a normal mood and affect. Judgment normal.  Vitals reviewed.   GI:  Lab Results: Recent Labs    11/12/19 1045 11/12/19 1827 11/13/19 0342  WBC 11.4* 14.3* 10.5  HGB 11.0* 10.0* 9.1*  HCT 33.8* 31.5* 28.9*  PLT 380 387 378   BMET Recent Labs    11/12/19 1044 11/12/19 1045 11/13/19 0342  NA 138 137 136  K 3.7 3.8 3.4*  CL 101 104 105  CO2  --  24 23  GLUCOSE 157* 156* 192*  BUN 9 10 14   CREATININE 0.60* 0.79 0.76  CALCIUM  --  8.2* 7.8*   LFT Recent Labs    11/12/19 1045  PROT 5.6*  ALBUMIN 2.2*  AST 22  ALT 20  ALKPHOS 63  BILITOT 0.6   PT/INR Recent Labs    11/12/19 1045  LABPROT 14.3  INR 1.1     Studies/Results: No results found.  Impression/Plan: -85 year old patient with GI bleed.  Bright red blood as well as dark-colored stool.  Underwent left heart cath with PCI yesterday for inferior STEMI.  Currently on dual antiplatelet therapy.  Most likely lower GI bleed.  Rectal exam negative.  -History of complicated diverticulitis status post partial sigmoid colectomy in 2016.  Was admitted 10 days ago to the hospital with diverticulitis and did not completed outpatient antibiotics.  -History of large hiatal hernia  Recommendations -------------------------- -Patient is not candidate for colonoscopy because of recent diverticulitis as well as need for dual antiplatelet therapy. -Check CT angio abdomen and pelvis for follow-up on diverticulitis as well as to rule out any active  bleeding.  -Consider starting antibiotics after CT scan if persistent diverticulitis.  -Start Protonix 40 mg IV twice a day given history of large hiatal hernia.  -Monitor H&H.  Transfuse if hemoglobin less than 8.  -GI will follow    LOS: 1 day   Otis Brace  MD, FACP 11/13/2019, 8:15 AM  Contact #  [phone]

## 2019-11-13 NOTE — Progress Notes (Addendum)
Progress Note  Patient Name: Kenneth [name] Date of Encounter: 11/13/2019  Primary Cardiologist: Sinclair Grooms, MD   Subjective   Patient doing okay this morning.  Notes that he had a lot of trouble with sleep overnight.  Still with some residual chest discomfort.  Pain not as bad as yesterday.  No shortness of breath.  He complains of nausea.  Inpatient Medications    Scheduled Meds: . amLODipine  5 mg Oral Daily  . aspirin  81 mg Oral Daily  . Chlorhexidine Gluconate Cloth  6 each Topical Daily  . ferrous sulfate  325 mg Oral Q breakfast  . isosorbide mononitrate  60 mg Oral QPM  . levothyroxine  50 mcg Oral QAC breakfast  . pantoprazole (PROTONIX) IV  40 mg Intravenous Q12H  . simvastatin  20 mg Oral q1800  . sodium chloride flush  3 mL Intravenous Q12H  . ticagrelor  90 mg Oral BID   Continuous Infusions: . sodium chloride     PRN Meds: sodium chloride, acetaminophen, ondansetron (ZOFRAN) IV, sodium chloride flush   Vital Signs    Vitals:   11/13/19 0500 11/13/19 0600 11/13/19 0700 11/13/19 0800  BP: 92/61 94/68 115/66 109/71  Pulse: 84 100 (!) 140 96  Resp: (!) 22 20 20  (!) 21  Temp:   98.2 F (36.8 C) (!) 97.3 F (36.3 C)  TempSrc:    Oral  SpO2: 99% 99% 95% 98%  Weight:  69.9 kg    Height:        Intake/Output Summary (Last 24 hours) at 11/13/2019 0930 Last data filed at 11/13/2019 0600 Gross per 24 hour  Intake 1436.18 ml  Output 175 ml  Net 1261.18 ml   Last 3 Weights 11/13/2019 11/12/2019 11/02/2019  Weight (lbs) 154 lb 1.6 oz 147 lb 11.3 oz 147 lb 11.3 oz  Weight (kg) 69.9 kg 67 kg 67 kg      Telemetry    Normal sinus rhythm with occasional PVCs- Personally Reviewed  ECG    Normal sinus rhythm with residual ST elevation, improved from baseline - Personally Reviewed  Physical Exam  Elderly male in no distress GEN: No acute distress.   Neck: No JVD Cardiac: RRR, no murmurs, rubs, or gallops.  Respiratory: Clear to auscultation  bilaterally. GI: Soft, nontender, non-distended  MS:  1+ bilateral pedal edema; No deformity.  Right groin site is clear with no ecchymosis or hematoma Neuro:  Nonfocal  Psych: Normal affect   Labs    High Sensitivity Troponin:   Recent Labs  Lab 11/12/19 1045 11/12/19 1327 11/12/19 1448  TROPONINIHS 8,449* 10,700* 9,411*      Chemistry Recent Labs  Lab 11/12/19 1044 11/12/19 1045 11/13/19 0342  NA 138 137 136  K 3.7 3.8 3.4*  CL 101 104 105  CO2  --  24 23  GLUCOSE 157* 156* 192*  BUN 9 10 14   CREATININE 0.60* 0.79 0.76  CALCIUM  --  8.2* 7.8*  PROT  --  5.6*  --   ALBUMIN  --  2.2*  --   AST  --  22  --   ALT  --  20  --   ALKPHOS  --  63  --   BILITOT  --  0.6  --   GFRNONAA  --  >60 >60  GFRAA  --  >60 >60  ANIONGAP  --  9 8     Hematology Recent Labs  Lab 11/12/19 1045 11/12/19 1827  11/13/19 0342  WBC 11.4* 14.3* 10.5  RBC 3.49* 3.20* 2.89*  HGB 11.0* 10.0* 9.1*  HCT 33.8* 31.5* 28.9*  MCV 96.8 98.4 100.0  MCH 31.5 31.3 31.5  MCHC 32.5 31.7 31.5  RDW 13.4 13.4 13.5  PLT 380 387 378    BNPNo results for input(s): BNP, PROBNP in the last 168 hours.   DDimer No results for input(s): DDIMER in the last 168 hours.   Radiology    No results found.  Cardiac Studies   2D echocardiogram: IMPRESSIONS    1. Severely reduce LVEF, with akinesis and hypokinesis in septal and inferior walls. Changed from prior echo in 2019.  2. Left ventricular ejection fraction, by visual estimation, is 20 to 25%. The left ventricle has severely decreased function. There is no left ventricular hypertrophy.  3. Multiple segmental abnormalities exist. See findings.  4. Definity contrast agent was given IV to delineate the left ventricular endocardial borders.  5. Left ventricular diastolic parameters are indeterminate.  6. The left ventricle demonstrates regional wall motion abnormalities.  7. Global right ventricle has moderately reduced systolic function.The  right ventricular size is normal. No increase in right ventricular wall thickness.  8. Left atrial size was mildly dilated.  9. Right atrial size was mildly dilated. 10. The mitral valve is grossly normal. Mild mitral valve regurgitation. 11. The tricuspid valve is normal in structure. Tricuspid valve regurgitation is trivial. 12. The aortic valve is tricuspid. Aortic valve regurgitation is not visualized. Mild aortic valve sclerosis without stenosis. 13. The pulmonic valve was not well visualized. Pulmonic valve regurgitation NA.  FINDINGS  Left Ventricle: Left ventricular ejection fraction, by visual estimation, is 20 to 25%. The left ventricle has severely decreased function. Definity contrast agent was given IV to delineate the left ventricular endocardial borders. The left ventricle  demonstrates regional wall motion abnormalities. There is no left ventricular hypertrophy. Left ventricular diastolic parameters are indeterminate.    LV Wall Scoring: The basal and mid inferior septum and basal anteroseptal segment are akinetic. The entire inferior wall, mid and apical anterior septum, and basal and mid inferolateral wall are hypokinetic. All remaining scored segments are normal.  Right Ventricle: The right ventricular size is normal. No increase in right ventricular wall thickness. Global RV systolic function is has moderately reduced systolic function.  Left Atrium: Left atrial size was mildly dilated.  Right Atrium: Right atrial size was mildly dilated  Pericardium: There is no evidence of pericardial effusion.  Mitral Valve: The mitral valve is grossly normal. Mild mitral valve regurgitation.  Tricuspid Valve: The tricuspid valve is normal in structure. Tricuspid valve regurgitation is trivial.  Aortic Valve: The aortic valve is tricuspid. Aortic valve regurgitation is not visualized. Mild aortic valve sclerosis is present, with no evidence of aortic valve  stenosis.  Pulmonic Valve: The pulmonic valve was not well visualized. Pulmonic valve regurgitation NA.  Aorta: The aortic root and ascending aorta are structurally normal, with no evidence of dilitation.  Venous: The inferior vena cava was not well visualized.  IAS/Shunts: No atrial level shunt detected by color flow Doppler.  Cardiac Cath 11-12-2019: Conclusion    Ost RCA to Prox RCA lesion is 100% stenosed.  Ost LM to Mid LM lesion is 100% stenosed.  Prox Graft to Mid Graft lesion is 95% stenosed.  A stent was successfully placed.  Post intervention, there is a 0% residual stenosis.  Origin to Prox Graft lesion is 99% stenosed.  Post intervention, there is a 0% residual  stenosis.  Dist LM to Prox LAD lesion is 100% stenosed with 100% stenosed side branch in Ost Cx to Dist Cx.  RPDA lesion is 100% stenosed.   Severe native CAD with total occlusion of the left main and ostial RCA.  Patent LIMA graft supplying the LAD  Patent SVG supplying the obtuse marginal vessel with collateralization to the distal diagonal vessel  Stents of thrombotic occlusion of the vein graft supplying a large dominant RCA PDA system.  Successful PCI to the SVG supplying the PDA requiring extensive percutaneous thrombectomy ultimate DES stenting with a 3.5 x 38 mm Resolute stent with the stenoses being reduced to 0%.  There is abrupt cut off of the very distal PDA most likely due to embolic occlusion.  Hand-injection left ventriculography revealed an EF estimate at 35 to 40%.  There is moderate inferior hypocontractility and a small focal region of apical akinesis.  LVEDP is 10 mm.  RECOMMENDATION; The patient received ticagrelor upon leaving the lab with plans to discontinue cangrelor once in the ICU.  Plan to discontinue Aggrastat after 2 to 4 hours and bivalirudin upon completion of his current bag.  The case was discussed at length with Dr. Tamala Julian who follows the patient.  With his  GI history other than 12 months of DAPT, will plan for short-term antiplatelet therapy per Resolute Onyx stent data.    Patient Profile     85 y.o. male with hx CABG presents with acute inferior STEMI secondary to thrombotic SVG-RCA occlusion, treated with PCI  Assessment & Plan    1. Inferior STEMI: continue ASA/ticagrelor. Continue high-intensity statin. Severe LV dysfunction noted. BP won't allow for aggressive post MI medical therapy. Change simvastatin to high intensity crestor 20 mg (atorva intolerant)..  2. GI Bleed: known recent diverticulitis, appreciate GI evaluation. HgB 11.2--->9.1 mg/dL. Continue to follow no indication for transfusion.Pt going for CT abdomen today.  3. Type II DM: CBG's 156-192, add SSI. D/W nursing staff.  4. HTN: BP soft on current Rx (90-111/53-78). Stop amlodipine with severe LV dysfunction and low BP. Continue to follow.  Dispo: keep in CCU today. Begin to mobilize as tolerated. Daughter updated at bedside.   For questions or updates, please contact [place] Please consult [email or web address] for contact info under     Signed, Sherren Mocha, MD  11/13/2019, 9:30 AM

## 2019-11-13 NOTE — Progress Notes (Addendum)
Am 12 lead EKG reviewed, compared to post cath EKG with Rebecca CN, similar/improving. Pt denies CP or pressure.

## 2019-11-14 ENCOUNTER — Ambulatory Visit: Payer: Medicare Other | Admitting: Interventional Cardiology

## 2019-11-14 DIAGNOSIS — K921 Melena: Secondary | ICD-10-CM | POA: Diagnosis not present

## 2019-11-14 DIAGNOSIS — I2119 ST elevation (STEMI) myocardial infarction involving other coronary artery of inferior wall: Principal | ICD-10-CM

## 2019-11-14 DIAGNOSIS — I255 Ischemic cardiomyopathy: Secondary | ICD-10-CM

## 2019-11-14 DIAGNOSIS — I10 Essential (primary) hypertension: Secondary | ICD-10-CM | POA: Diagnosis not present

## 2019-11-14 LAB — CBC
HCT: 26.7 % — ABNORMAL LOW (ref 39.0–52.0)
Hemoglobin: 8.5 g/dL — ABNORMAL LOW (ref 13.0–17.0)
MCH: 31.4 pg (ref 26.0–34.0)
MCHC: 31.8 g/dL (ref 30.0–36.0)
MCV: 98.5 fL (ref 80.0–100.0)
Platelets: 417 10*3/uL — ABNORMAL HIGH (ref 150–400)
RBC: 2.71 MIL/uL — ABNORMAL LOW (ref 4.22–5.81)
RDW: 13.5 % (ref 11.5–15.5)
WBC: 11.4 10*3/uL — ABNORMAL HIGH (ref 4.0–10.5)
nRBC: 0 % (ref 0.0–0.2)

## 2019-11-14 LAB — GLUCOSE, CAPILLARY
Glucose-Capillary: 152 mg/dL — ABNORMAL HIGH (ref 70–99)
Glucose-Capillary: 99 mg/dL (ref 70–99)
Glucose-Capillary: 148 mg/dL — ABNORMAL HIGH (ref 70–99)
Glucose-Capillary: 133 mg/dL — ABNORMAL HIGH (ref 70–99)

## 2019-11-14 LAB — HEMOGLOBIN AND HEMATOCRIT, BLOOD
HCT: 27.6 % — ABNORMAL LOW (ref 39.0–52.0)
HCT: 25.7 % — ABNORMAL LOW (ref 39.0–52.0)
Hemoglobin: 8.7 g/dL — ABNORMAL LOW (ref 13.0–17.0)
Hemoglobin: 8.5 g/dL — ABNORMAL LOW (ref 13.0–17.0)

## 2019-11-14 NOTE — Progress Notes (Signed)
[facility] Gastroenterology Progress Note  Kenneth [name] 85 y.o. [birth year]  CC: GI bleed, STEMI   Subjective: Patient denies any abdominal pain.  No bowel movement overnight or this morning.  Occasional nausea but denies any vomiting.  ROS : Mild chest pain.  No shortness of breath.   Objective: Vital signs in last 24 hours: Vitals:   11/14/19 0600 11/14/19 0700  BP: 120/72 103/70  Pulse: 85 92  Resp: (!) 21 20  Temp:  98.6 F (37 C)  SpO2: 100% 97%    Physical Exam:  General.  Elderly patient, not in acute distress.  Resting comfortably Abdomen.  Soft, nontender, nondistended, bowel sounds present Neuro.  Alert/oriented x3 Psych.  Mood and affect normal  Lab Results: Recent Labs    11/12/19 1045 11/13/19 0342  NA 137 136  K 3.8 3.4*  CL 104 105  CO2 24 23  GLUCOSE 156* 192*  BUN 10 14  CREATININE 0.79 0.76  CALCIUM 8.2* 7.8*   Recent Labs    11/12/19 1045  AST 22  ALT 20  ALKPHOS 63  BILITOT 0.6  PROT 5.6*  ALBUMIN 2.2*   Recent Labs    11/13/19 0342 11/14/19 0401  WBC 10.5 11.4*  HGB 9.1* 8.5*  HCT 28.9* 26.7*  MCV 100.0 98.5  PLT 378 417*   Recent Labs    11/12/19 1045  LABPROT 14.3  INR 1.1      Assessment/Plan: -85 year old patient with GI bleed.  Bright red blood as well as dark-colored stool.  Underwent left heart cath with PCI 11/12/2019  for inferior STEMI.  Currently on dual antiplatelet therapy.  Rectal exam was negative.  CT angio negative for active bleeding yesterday.  -Acute blood loss anemia. -History of complicated diverticulitis status post partial sigmoid colectomy in 2016.  Was admitted 10 days ago to the hospital with diverticulitis and did not completed outpatient antibiotics.  -History of large hiatal hernia -Abnormal CT scan concerning for right inferior renal artery stenosis as well as proximal left common iliac artery stenosis -patient with known vascular disease.  Currently on dual antiplatelet  therapy.  Recommendations -------------------------- -Patient is not candidate for colonoscopy because of recent diverticulitis as well as need for dual antiplatelet therapy. - CT angio abdomen and pelvis negative for active bleeding.  - Mild drop in hemoglobin noted. -Continue IV twice daily PPI for now -H&H every 12 hours.  If continues to drop hemoglobin, consider EGD tomorrow to rule out any Cameron's ulcers or Cameron's erosion because of large hiatal hernia.  -Keep n.p.o. past midnight -Transfuse as needed  -GI will follow   Otis Brace MD, [place] 11/14/2019, 8:35 AM  Contact #  [phone]

## 2019-11-14 NOTE — Progress Notes (Signed)
Progress Note  Patient Name: Kenneth [name] Date of Encounter: 11/14/2019  Primary Cardiologist: Sinclair Grooms, MD   Subjective   No chest pain or dyspnea this am.   Inpatient Medications    Scheduled Meds: . aspirin  81 mg Oral Daily  . Chlorhexidine Gluconate Cloth  6 each Topical Daily  . ferrous sulfate  325 mg Oral Q breakfast  . insulin aspart  0-15 Units Subcutaneous TID WC  . isosorbide mononitrate  60 mg Oral QPM  . levothyroxine  50 mcg Oral QAC breakfast  . pantoprazole (PROTONIX) IV  40 mg Intravenous Q12H  . rosuvastatin  20 mg Oral q1800  . sodium chloride flush  3 mL Intravenous Q12H  . ticagrelor  90 mg Oral BID   Continuous Infusions: . sodium chloride     PRN Meds: sodium chloride, acetaminophen, ondansetron (ZOFRAN) IV, sodium chloride flush   Vital Signs    Vitals:   11/14/19 0400 11/14/19 0500 11/14/19 0600 11/14/19 0700  BP: 100/66 (!) 108/57 120/72 103/70  Pulse: 89 84 85 92  Resp: 19 (!) 21 (!) 21 20  Temp: 98.7 F (37.1 C)   98.6 F (37 C)  TempSrc: Oral     SpO2: 97% 100% 100% 97%  Weight:   69.3 kg   Height:        Intake/Output Summary (Last 24 hours) at 11/14/2019 0801 Last data filed at 11/14/2019 0600 Gross per 24 hour  Intake 480 ml  Output 300 ml  Net 180 ml   Last 3 Weights 11/14/2019 11/13/2019 11/12/2019  Weight (lbs) 152 lb 12.5 oz 154 lb 1.6 oz 147 lb 11.3 oz  Weight (kg) 69.3 kg 69.9 kg 67 kg      Telemetry    Sinus- Personally Reviewed  ECG    No AM EKG - Personally Reviewed  Physical Exam   General: Well developed, well nourished, NAD  HEENT: OP clear, mucus membranes moist  SKIN: warm, dry. No rashes. Neuro: No focal deficits  Musculoskeletal: Muscle strength 5/5 all ext  Psychiatric: Mood and affect normal  Neck: No JVD, no carotid bruits, no thyromegaly, no lymphadenopathy.  Lungs:Clear bilaterally, no wheezes, rhonci, crackles Cardiovascular: Regular rate and rhythm. No murmurs, gallops or  rubs. Abdomen:Soft. Bowel sounds present. Non-tender.  Extremities: No lower extremity edema. Pulses are 2 + in the bilateral DP/PT.   Labs    High Sensitivity Troponin:   Recent Labs  Lab 11/12/19 1045 11/12/19 1327 11/12/19 1448  TROPONINIHS 8,449* 10,700* 9,411*      Chemistry Recent Labs  Lab 11/12/19 1044 11/12/19 1045 11/13/19 0342  NA 138 137 136  K 3.7 3.8 3.4*  CL 101 104 105  CO2  --  24 23  GLUCOSE 157* 156* 192*  BUN 9 10 14   CREATININE 0.60* 0.79 0.76  CALCIUM  --  8.2* 7.8*  PROT  --  5.6*  --   ALBUMIN  --  2.2*  --   AST  --  22  --   ALT  --  20  --   ALKPHOS  --  63  --   BILITOT  --  0.6  --   GFRNONAA  --  >60 >60  GFRAA  --  >60 >60  ANIONGAP  --  9 8     Hematology Recent Labs  Lab 11/12/19 1827 11/13/19 0342 11/14/19 0401  WBC 14.3* 10.5 11.4*  RBC 3.20* 2.89* 2.71*  HGB 10.0* 9.1* 8.5*  HCT 31.5* 28.9* 26.7*  MCV 98.4 100.0 98.5  MCH 31.3 31.5 31.4  MCHC 31.7 31.5 31.8  RDW 13.4 13.5 13.5  PLT 387 378 417*    BNPNo results for input(s): BNP, PROBNP in the last 168 hours.   DDimer No results for input(s): DDIMER in the last 168 hours.   Radiology    Ct Angio Abd/pel W/ And/or W/o  Result Date: 11/13/2019 CLINICAL DATA:  Bright red stools yesterday, tarry stools overnight EXAM: CTA ABDOMEN AND PELVIS WITHOUT AND WITH CONTRAST TECHNIQUE: Multidetector CT imaging of the abdomen and pelvis was performed using the standard protocol during bolus administration of intravenous contrast. Multiplanar reconstructed images and MIPs were obtained and reviewed to evaluate the vascular anatomy. CONTRAST:  174mL OMNIPAQUE IOHEXOL 350 MG/ML SOLN COMPARISON:  11/02/2019 FINDINGS: VASCULAR Aorta: Moderate calcified atheromatous plaque. No aneurysm, dissection, or stenosis. Celiac: Calcified ostial plaque without high-grade stenosis. Patent without evidence of aneurysm, dissection, vasculitis or significant stenosis. SMA: Calcified ostial plaque  without high-grade stenosis. Patent distally with classic distal branch anatomy. Renals: Single left, with calcified plaque extending from the origin 1.6 cm resulting in at least moderate stenosis. Duplicated right, inferior dominant with calcified ostial plaque resulting in short segment high-grade stenosis. IMA: Atheromatous, patent. Inflow: Eccentric calcified plaque in the proximal left common iliac artery resulting in stenosis of probable hemodynamic significance, patent distally. Scattered nonocclusive plaque through the right iliac arterial system. Proximal Outflow: Atheromatous, patent. Veins: Patent hepatic veins, portal vein, SMV, splenic vein, bilateral renal veins, IVC. No venous pathology identified. Review of the MIP images confirms the above findings. NON-VASCULAR Lower chest: Large hiatal hernia involving most of the stomach. Tiny bilateral pleural effusions. Calcified subpleural granuloma in right middle lobe (Im3,Se9) . coronary calcifications. Previous median sternotomy. Hepatobiliary: Stable cyst in hepatic segment 6. No new liver lesion. Gallbladder incompletely distended. No biliary ductal dilatation. Pancreas: Mild atrophy. Scattered coarse parenchymal calcifications suggesting chronic pancreatitis. No regional inflammatory changes or fluid collections. Spleen: Normal in size without focal abnormality. Small accessory splenule. Adrenals/Urinary Tract: Normal adrenal glands. Bilateral nephrolithiasis, largest stone 8 mm in the lower pole right renal collecting system. No hydronephrosis. Symmetric renal parenchymal enhancement. Stomach/Bowel: Stomach is nondilated, largely within hiatal hernia. Hyperdense material in the gastric fundus. Small bowel is nondistended. A knuckle of small bowel protrudes into a periumbilical hernia, without obstruction or strangulation. Appendix not identified. The colon is nondilated., with multiple descending diverticula. No adjacent inflammatory/edematous change  or abscess. No active extravasation. Lymphatic: No abdominal or pelvic adenopathy. Reproductive: Prostate is unremarkable. Other: No ascites. No free air. Musculoskeletal: Small periumbilical hernia containing a knuckle of small bowel, without obstruction or strangulation. Mild spondylitic changes in the lower lumbar spine. No fracture or worrisome bone lesion. IMPRESSION: 1. No active GI bleed identified. 2. Large hiatal hernia involving most of the stomach, incompletely included on the scan. 3. Small periumbilical hernia involving small bowel, without obstruction or strangulation. 4. Descending colon diverticulosis. 5. Right inferior renal artery ostial stenosis of probable hemodynamic significance. 6. Proximal left common iliac artery calcified stenosis; correlate with clinical symptomatology and ABIs. 7. Bilateral nephrolithiasis without hydronephrosis. 8. Tiny bilateral pleural effusions. 9. Coronary and Aortic Atherosclerosis (ICD10-170.0). Electronically Signed   By: Lucrezia Europe M.D.   On: 11/13/2019 12:28    Cardiac Studies   2D echocardiogram: IMPRESSIONS    1. Severely reduce LVEF, with akinesis and hypokinesis in septal and inferior walls. Changed from prior echo in 2019.  2. Left ventricular ejection fraction,  by visual estimation, is 20 to 25%. The left ventricle has severely decreased function. There is no left ventricular hypertrophy.  3. Multiple segmental abnormalities exist. See findings.  4. Definity contrast agent was given IV to delineate the left ventricular endocardial borders.  5. Left ventricular diastolic parameters are indeterminate.  6. The left ventricle demonstrates regional wall motion abnormalities.  7. Global right ventricle has moderately reduced systolic function.The right ventricular size is normal. No increase in right ventricular wall thickness.  8. Left atrial size was mildly dilated.  9. Right atrial size was mildly dilated. 10. The mitral valve is grossly  normal. Mild mitral valve regurgitation. 11. The tricuspid valve is normal in structure. Tricuspid valve regurgitation is trivial. 12. The aortic valve is tricuspid. Aortic valve regurgitation is not visualized. Mild aortic valve sclerosis without stenosis. 13. The pulmonic valve was not well visualized. Pulmonic valve regurgitation NA.  FINDINGS  Left Ventricle: Left ventricular ejection fraction, by visual estimation, is 20 to 25%. The left ventricle has severely decreased function. Definity contrast agent was given IV to delineate the left ventricular endocardial borders. The left ventricle  demonstrates regional wall motion abnormalities. There is no left ventricular hypertrophy. Left ventricular diastolic parameters are indeterminate.    LV Wall Scoring: The basal and mid inferior septum and basal anteroseptal segment are akinetic. The entire inferior wall, mid and apical anterior septum, and basal and mid inferolateral wall are hypokinetic. All remaining scored segments are normal.  Right Ventricle: The right ventricular size is normal. No increase in right ventricular wall thickness. Global RV systolic function is has moderately reduced systolic function.  Left Atrium: Left atrial size was mildly dilated.  Right Atrium: Right atrial size was mildly dilated  Pericardium: There is no evidence of pericardial effusion.  Mitral Valve: The mitral valve is grossly normal. Mild mitral valve regurgitation.  Tricuspid Valve: The tricuspid valve is normal in structure. Tricuspid valve regurgitation is trivial.  Aortic Valve: The aortic valve is tricuspid. Aortic valve regurgitation is not visualized. Mild aortic valve sclerosis is present, with no evidence of aortic valve stenosis.  Pulmonic Valve: The pulmonic valve was not well visualized. Pulmonic valve regurgitation NA.  Aorta: The aortic root and ascending aorta are structurally normal, with no evidence of  dilitation.  Venous: The inferior vena cava was not well visualized.  IAS/Shunts: No atrial level shunt detected by color flow Doppler.  Cardiac Cath 11-12-2019: Conclusion    Ost RCA to Prox RCA lesion is 100% stenosed.  Ost LM to Mid LM lesion is 100% stenosed.  Prox Graft to Mid Graft lesion is 95% stenosed.  A stent was successfully placed.  Post intervention, there is a 0% residual stenosis.  Origin to Prox Graft lesion is 99% stenosed.  Post intervention, there is a 0% residual stenosis.  Dist LM to Prox LAD lesion is 100% stenosed with 100% stenosed side branch in Ost Cx to Dist Cx.  RPDA lesion is 100% stenosed.   Severe native CAD with total occlusion of the left main and ostial RCA.  Patent LIMA graft supplying the LAD  Patent SVG supplying the obtuse marginal vessel with collateralization to the distal diagonal vessel  Stents of thrombotic occlusion of the vein graft supplying a large dominant RCA PDA system.  Successful PCI to the SVG supplying the PDA requiring extensive percutaneous thrombectomy ultimate DES stenting with a 3.5 x 38 mm Resolute stent with the stenoses being reduced to 0%.  There is abrupt cut off of  the very distal PDA most likely due to embolic occlusion.  Hand-injection left ventriculography revealed an EF estimate at 35 to 40%.  There is moderate inferior hypocontractility and a small focal region of apical akinesis.  LVEDP is 10 mm.  RECOMMENDATION; The patient received ticagrelor upon leaving the lab with plans to discontinue cangrelor once in the ICU.  Plan to discontinue Aggrastat after 2 to 4 hours and bivalirudin upon completion of his current bag.  The case was discussed at length with Dr. Tamala Julian who follows the patient.  With his GI history other than 12 months of DAPT, will plan for short-term antiplatelet therapy per Resolute Onyx stent data.    Patient Profile     85 y.o. male with hx CABG presents with acute inferior  STEMI secondary to thrombotic SVG-RCA occlusion, treated with PCI  Assessment & Plan    1. CAD/Inferior STEMI: No chest pain this am. He has severe LV systolic dysfunction post MI. Will continue ASA, Brilinta, statin. His BP has been too soft for a beta blocker or ARB.   2. GI Bleed: known recent diverticulitis. Appreciate GI assistance. Hgb down to 8.5 today. No active bleeding noted on CTA abd/pelvis. Continue protonix. If H/H falls more, may need EGD. NPO at midnight.   3. Type II DM: Blood sugar 152 this am. Home metformin held post cath. Can resume tomorrow. Continue SSI  4. HTN: BP soft. Will not add beta blocker or ARB  5. Ischemic cardiomyopathy: Severe LV systolic dysfunction post MI. Norvasc stopped due to LV dysfunction. May be able to add in in low dose beta blocker prior to discharge if BP tolerates. No ARB with soft BP   For questions or updates, please contact [place] Please consult [email or web address] for contact info under     Signed, Lauree Chandler, MD  11/14/2019, 8:01 AM

## 2019-11-14 NOTE — Progress Notes (Signed)
CARDIAC REHAB PHASE I   PRE:  Rate/Rhythm: 82 SR PACs  BP:  Supine: 109/63  Sitting:   Standing:    SaO2: 100%RA  MODE:  Ambulation: 190 ft   POST:  Rate/Rhythm: 104 ST PACs  BP:  Supine:   Sitting: 128/72  Standing:    SaO2: 100%RA 1035-1115 Took a little while to get pt to walk. He stated he was sleepy from pill but able to wake up. Discussed with pt that he had a heart attack and got a stent and would need to take brilinta to keep stent open. Discussed CRP 2 and referred to [facility]. Pt walked 190 ft on RA with gait belt, asst x 2 and EVA. Back to bed after walk as pt still a little groggy. Put bed alarm back on. No c/o CP.   Graylon Good, RN BSN  11/14/2019 11:11 AM

## 2019-11-14 NOTE — Plan of Care (Signed)
  Problem: Education: Goal: Knowledge of General Education information will improve Description: Including pain rating scale, medication(s)/side effects and non-pharmacologic comfort measures Outcome: Progressing   Problem: Clinical Measurements: Goal: Will remain free from infection Outcome: Progressing Goal: Respiratory complications will improve Outcome: Progressing Goal: Cardiovascular complication will be avoided Outcome: Progressing   Problem: Coping: Goal: Level of anxiety will decrease Outcome: Progressing   Problem: Elimination: Goal: Will not experience complications related to urinary retention Outcome: Progressing   Problem: Pain Managment: Goal: General experience of comfort will improve Outcome: Progressing   Problem: Safety: Goal: Ability to remain free from injury will improve Outcome: Progressing   Problem: Skin Integrity: Goal: Risk for impaired skin integrity will decrease Outcome: Progressing

## 2019-11-15 DIAGNOSIS — I255 Ischemic cardiomyopathy: Secondary | ICD-10-CM | POA: Diagnosis not present

## 2019-11-15 DIAGNOSIS — I10 Essential (primary) hypertension: Secondary | ICD-10-CM | POA: Diagnosis not present

## 2019-11-15 DIAGNOSIS — I2129 ST elevation (STEMI) myocardial infarction involving other sites: Secondary | ICD-10-CM | POA: Diagnosis not present

## 2019-11-15 LAB — CBC
HCT: 27.4 % — ABNORMAL LOW (ref 39.0–52.0)
Hemoglobin: 8.8 g/dL — ABNORMAL LOW (ref 13.0–17.0)
MCH: 31.3 pg (ref 26.0–34.0)
MCHC: 32.1 g/dL (ref 30.0–36.0)
MCV: 97.5 fL (ref 80.0–100.0)
Platelets: 483 10*3/uL — ABNORMAL HIGH (ref 150–400)
RBC: 2.81 MIL/uL — ABNORMAL LOW (ref 4.22–5.81)
RDW: 13.2 % (ref 11.5–15.5)
WBC: 9.9 10*3/uL (ref 4.0–10.5)
nRBC: 0 % (ref 0.0–0.2)

## 2019-11-15 LAB — BASIC METABOLIC PANEL
Anion gap: 8 (ref 5–15)
BUN: 8 mg/dL (ref 8–23)
CO2: 22 mmol/L (ref 22–32)
Calcium: 8.3 mg/dL — ABNORMAL LOW (ref 8.9–10.3)
Chloride: 108 mmol/L (ref 98–111)
Creatinine, Ser: 0.79 mg/dL (ref 0.61–1.24)
GFR calc Af Amer: >60 mL/min (ref >60)
GFR calc non Af Amer: >60 mL/min (ref >60)
Glucose, Bld: 156 mg/dL — ABNORMAL HIGH (ref 70–99)
Potassium: 4.2 mmol/L (ref 3.5–5.1)
Sodium: 138 mmol/L (ref 135–145)

## 2019-11-15 LAB — GLUCOSE, CAPILLARY
Glucose-Capillary: 146 mg/dL — ABNORMAL HIGH (ref 70–99)
Glucose-Capillary: 260 mg/dL — ABNORMAL HIGH (ref 70–99)
Glucose-Capillary: 113 mg/dL — ABNORMAL HIGH (ref 70–99)
Glucose-Capillary: 184 mg/dL — ABNORMAL HIGH (ref 70–99)

## 2019-11-15 LAB — HEMOGLOBIN AND HEMATOCRIT, BLOOD
HCT: 28.4 % — ABNORMAL LOW (ref 39.0–52.0)
Hemoglobin: 9.1 g/dL — ABNORMAL LOW (ref 13.0–17.0)

## 2019-11-15 MED ORDER — METOPROLOL SUCCINATE ER 25 MG PO TB24
25.0000 mg | ORAL_TABLET | Freq: Every day | ORAL | Status: DC
  Administered 2019-11-15 – 2019-11-16 (×2): 25 mg via ORAL
  Filled 2019-11-15 (×2): qty 1

## 2019-11-15 MED ORDER — RAMELTEON 8 MG PO TABS
8.0000 mg | ORAL_TABLET | Freq: Every evening | ORAL | Status: DC | PRN
  Administered 2019-11-15: 8 mg via ORAL
  Filled 2019-11-15 (×2): qty 1

## 2019-11-15 MED ORDER — METFORMIN HCL 500 MG PO TABS
500.0000 mg | ORAL_TABLET | Freq: Every day | ORAL | Status: DC
  Administered 2019-11-16: 500 mg via ORAL
  Filled 2019-11-15: qty 1

## 2019-11-15 NOTE — Plan of Care (Signed)
  Problem: Clinical Measurements: Goal: Will remain free from infection Outcome: Progressing Goal: Respiratory complications will improve Outcome: Progressing   Problem: Nutrition: Goal: Adequate nutrition will be maintained Outcome: Progressing   Problem: Coping: Goal: Level of anxiety will decrease Outcome: Progressing   Problem: Pain Managment: Goal: General experience of comfort will improve Outcome: Progressing   Problem: Safety: Goal: Ability to remain free from injury will improve Outcome: Progressing

## 2019-11-15 NOTE — Progress Notes (Signed)
[facility] Gastroenterology Progress Note  Kenneth [name] 85 y.o. 01-Apr-1928  CC: GI bleed, STEMI   Subjective: Patient seen and examined at bedside.  Last bowel movement yesterday was nonbloody according to patient.  No bowel movement today.  Complaining of mild left lower quadrant discomfort.  Denies any nausea or vomiting.  ROS : Negative for chest pain.  No shortness of breath.   Objective: Vital signs in last 24 hours: Vitals:   11/15/19 0250 11/15/19 0805  BP: 114/79 122/80  Pulse: (!) 108   Resp:  15  Temp: 98 F (36.7 C)   SpO2: 99%     Physical Exam:  General.  Elderly patient, not in acute distress.  Resting comfortably Abdomen.  Soft, nontender, nondistended, bowel sounds present Neuro.  Alert/oriented x3 Psych.  Mood and affect normal  Lab Results: Recent Labs    11/13/19 0342 11/15/19 0612  NA 136 138  K 3.4* 4.2  CL 105 108  CO2 23 22  GLUCOSE 192* 156*  BUN 14 8  CREATININE 0.76 0.79  CALCIUM 7.8* 8.3*   Recent Labs    11/12/19 1045  AST 22  ALT 20  ALKPHOS 63  BILITOT 0.6  PROT 5.6*  ALBUMIN 2.2*   Recent Labs    11/14/19 0401 11/15/19 0612 11/15/19 0821  WBC 11.4* 9.9  --   HGB 8.5* 8.8* 9.1*  HCT 26.7* 27.4* 28.4*  MCV 98.5 97.5  --   PLT 417* 483*  --    Recent Labs    11/12/19 1045  LABPROT 14.3  INR 1.1      Assessment/Plan: -85 year old patient with GI bleed.  Bright red blood as well as dark-colored stool.  Underwent left heart cath with PCI 11/12/2019  for inferior STEMI.  Currently on dual antiplatelet therapy.  Rectal exam was negative.  CT angio negative for active bleeding yesterday.  -Acute blood loss anemia. -History of complicated diverticulitis status post partial sigmoid colectomy in 2016.  Was admitted 10 days ago to the hospital with diverticulitis and did not completed outpatient antibiotics.  -History of large hiatal hernia. ?  Cameron's ulcers or Cameron's erosion  -Abnormal CT scan concerning for  right inferior renal artery stenosis as well as proximal left common iliac artery stenosis -patient with known vascular disease.  Currently on dual antiplatelet therapy.  Recommendations -------------------------- -Patient's hemoglobin is stable.  Patient denies any bleeding episodes. -Continue IV twice daily PPI while in the hospital, recommend discharging home on Protonix 40 mg twice a day for 4 weeks followed by once a day for another 4 weeks. -Patient is not candidate for colonoscopy because of recent diverticulitis as well as need for dual antiplatelet therapy. - CT angio abdomen and pelvis negative for active bleeding.   -Advance diet as tolerated.  No further inpatient GI work-up planned.  GI will sign off.  Call us back if needed.   Otis Brace MD, [place] 11/15/2019, 9:29 AM  Contact #  [phone]

## 2019-11-15 NOTE — Progress Notes (Signed)
Physical Therapy Progress Note for Charges    11/15/19 1600  PT Visit Information  Last PT Received On 11/15/19  PT General Charges  $$ ACUTE PT VISIT 1 Visit  PT Evaluation  $PT Eval Moderate Complexity 1 Mod  Eduard Clos, PT, DPT  Acute Rehabilitation Services Pager [phone] Office [phone]

## 2019-11-15 NOTE — Evaluation (Signed)
Physical Therapy Evaluation Patient Details Name: Kenneth [name] MRN: [id number] DOB: [birth year] Today's Date: 11/15/2019   History of Present Illness  85yo male admitted for acute MI s/p stent to RCA. PMHx including but not limited to divertiluitis requiring sigmoid colectomy, anemia, arthritis, CAD, DM, GERD, glaucoma, hernia, goit, HLD, HTN, thyroid disease, and STEMI.  Clinical Impression  Pt admitted for above. Pt mod I for bed mobility, supervision for transfers, and min guard for gait for safety. He required frequent cueing for safety and to slow down during ambulation. Upon entry, his HR was 95 at rest, and throughout gait, his HR increased to 127 at its highest. RN notified. Plan is for pt to d/c home today or tomorrow, per pt. Recommend HH PT to help address deficits in strength, balance, gait, endurance, and overall functional mobility. Pt states his step-daughter will be available to help him with mobility at home. Pt has RW and cane at home, so no further equipment recommended. Pt would benefit from continued skilled acute PT in order to address deficits.     Follow Up Recommendations Home health PT    Equipment Recommendations    None by PT   Recommendations for Other Services       Precautions / Restrictions Precautions Precautions: Fall Restrictions Weight Bearing Restrictions: No      Mobility  Bed Mobility Overal bed mobility: Modified Independent             General bed mobility comments: HOB elevated  Transfers Overall transfer level: Needs assistance Equipment used: None Transfers: Sit to/from Stand Sit to Stand: Supervision         General transfer comment: supervision for safety and tele-box management  Ambulation/Gait Ambulation/Gait assistance: Min guard Gait Distance (Feet): 150 Feet Assistive device: Rolling walker (2 wheeled) Gait Pattern/deviations: Step-through pattern;Decreased dorsiflexion - right;Decreased dorsiflexion -  left;Wide base of support;Drifts right/left Gait velocity: decreased   General Gait Details: increased speed, required cues to slow down; no LOB or unsteadiness noted  Stairs            Wheelchair Mobility    Modified Rankin (Stroke Patients Only)       Balance Overall balance assessment: Needs assistance Sitting-balance support: No upper extremity supported;Feet supported Sitting balance-Leahy Scale: Good Sitting balance - Comments: maintain sitting balance at EOB for donning/doffing gait belt   Standing balance support: No upper extremity supported;During functional activity Standing balance-Leahy Scale: Good Standing balance comment: able to maintain static standing balance for don/doff of mask                             Pertinent Vitals/Pain Pain Assessment: No/denies pain    Home Living Family/patient expects to be discharged to:: Private residence Living Arrangements: Spouse/significant other Available Help at Discharge: Family;Available 24 hours/day Type of Home: House Home Access: Stairs to enter Entrance Stairs-Rails: None Entrance Stairs-Number of Steps: 2 steps from carport; 8 steps from back porch Home Layout: One level Home Equipment: Environmental consultant - 2 wheels      Prior Function Level of Independence: Independent               Hand Dominance   Dominant Hand: Right    Extremity/Trunk Assessment   Upper Extremity Assessment Upper Extremity Assessment: Defer to OT evaluation    Lower Extremity Assessment Lower Extremity Assessment: Overall WFL for tasks assessed    Cervical / Trunk Assessment Cervical / Trunk Assessment: Kyphotic  Communication   Communication: HOH  Cognition Arousal/Alertness: Awake/alert Behavior During Therapy: WFL for tasks assessed/performed Overall Cognitive Status: Impaired/Different from baseline Area of Impairment: Following commands;Attention;Safety/judgement                   Current  Attention Level: Sustained   Following Commands: Follows multi-step commands inconsistently;Follows one step commands consistently;Follows one step commands with increased time;Follows multi-step commands with increased time Safety/Judgement: Decreased awareness of safety     General Comments: some difficulty following commands, a little impulsive with movement; required some redirection with eval questioning       General Comments      Exercises     Assessment/Plan    PT Assessment Patient needs continued PT services  PT Problem List Cardiopulmonary status limiting activity;Decreased strength;Decreased balance;Decreased activity tolerance;Decreased knowledge of use of DME;Decreased safety awareness       PT Treatment Interventions DME instruction;Balance training;Gait training;Neuromuscular re-education;Stair training;Functional mobility training;Patient/family education;Therapeutic activities;Therapeutic exercise    PT Goals (Current goals can be found in the Care Plan section)  Acute Rehab PT Goals Patient Stated Goal: to go home PT Goal Formulation: With patient Time For Goal Achievement: 11/29/19 Potential to Achieve Goals: Good    Frequency Min 3X/week   Barriers to discharge        Co-evaluation               AM-PAC PT "6 Clicks" Mobility  Outcome Measure Help needed turning from your back to your side while in a flat bed without using bedrails?: None Help needed moving from lying on your back to sitting on the side of a flat bed without using bedrails?: None Help needed moving to and from a bed to a chair (including a wheelchair)?: None Help needed standing up from a chair using your arms (e.g., wheelchair or bedside chair)?: None Help needed to walk in hospital room?: A Little Help needed climbing 3-5 steps with a railing? : A Little 6 Click Score: 22    End of Session Equipment Utilized During Treatment: Gait belt Activity Tolerance: Patient  tolerated treatment well Patient left: in bed;with call bell/phone within reach Nurse Communication: Mobility status PT Visit Diagnosis: Unsteadiness on feet (R26.81);History of falling (Z91.81);Muscle weakness (generalized) (M62.81)    Time: [id number] PT Time Calculation (min) (ACUTE ONLY): 19 min   Charges:   PT Evaluation $PT Eval Moderate Complexity: 1 Mod          Christel Mormon, SPT  Kenneth [name] 11/15/2019, 4:24 PM

## 2019-11-15 NOTE — Progress Notes (Signed)
CARDIAC REHAB PHASE I   PRE:  Rate/Rhythm: 92 SR with PACs  BP:  Sitting: 100/84      MODE:  Ambulation: 240 ft   POST:  Rate/Rhythm: 123 ST with PACs  BP:  Sitting: 127/77    SaO2: 99 RA  Pt able to ambulate in hallway assist of 2 with front wheel walker. Pt with fast gait and c/o SOB. Tried to encourage pt to slow down, pt states he is "ornery". Pt helped to recliner, chair alarm on. Ordered pts breakfast. Call bell and phone within reach. Reinforced importance of Brilinta.   [id number] Rufina Falco, RN BSN 11/15/2019 9:40 AM

## 2019-11-15 NOTE — Progress Notes (Signed)
Progress Note  Patient Name: Kenneth [name] Date of Encounter: 11/15/2019  Primary Cardiologist: Sinclair Grooms, MD   Subjective   No chest pain. Feels well this am.   Inpatient Medications    Scheduled Meds: . aspirin  81 mg Oral Daily  . Chlorhexidine Gluconate Cloth  6 each Topical Daily  . ferrous sulfate  325 mg Oral Q breakfast  . insulin aspart  0-15 Units Subcutaneous TID WC  . isosorbide mononitrate  60 mg Oral QPM  . levothyroxine  50 mcg Oral QAC breakfast  . pantoprazole (PROTONIX) IV  40 mg Intravenous Q12H  . rosuvastatin  20 mg Oral q1800  . sodium chloride flush  3 mL Intravenous Q12H  . ticagrelor  90 mg Oral BID   Continuous Infusions: . sodium chloride Stopped (11/14/19 2100)   PRN Meds: sodium chloride, acetaminophen, ondansetron (ZOFRAN) IV, ramelteon, sodium chloride flush   Vital Signs    Vitals:   11/14/19 1645 11/14/19 1943 11/14/19 2059 11/15/19 0250  BP: 119/87 121/60  114/79  Pulse: 91 86  (!) 108  Resp: 20 18    Temp:   97.9 F (36.6 C) 98 F (36.7 C)  TempSrc:   Oral Oral  SpO2: 99% 98%  99%  Weight:    68.9 kg  Height:        Intake/Output Summary (Last 24 hours) at 11/15/2019 0806 Last data filed at 11/15/2019 0700 Gross per 24 hour  Intake 580 ml  Output 975 ml  Net -395 ml   Last 3 Weights 11/15/2019 11/14/2019 11/13/2019  Weight (lbs) 151 lb 12.8 oz 152 lb 12.5 oz 154 lb 1.6 oz  Weight (kg) 68.856 kg 69.3 kg 69.9 kg      Telemetry    sinus- Personally Reviewed  ECG     No AM EKG- Personally Reviewed  Physical Exam   General: Well developed, well nourished, NAD  HEENT: OP clear, mucus membranes moist  SKIN: warm, dry. No rashes. Neuro: No focal deficits  Musculoskeletal: Muscle strength 5/5 all ext  Psychiatric: Mood and affect normal  Neck: No JVD, no carotid bruits, no thyromegaly, no lymphadenopathy.  Lungs:Clear bilaterally, no wheezes, rhonci, crackles Cardiovascular: Regular rate and rhythm. No  murmurs, gallops or rubs. Abdomen:Soft. Bowel sounds present. Non-tender.  Extremities: No lower extremity edema. Pulses are 2 + in the bilateral DP/PT.    Labs    High Sensitivity Troponin:   Recent Labs  Lab 11/12/19 1045 11/12/19 1327 11/12/19 1448  TROPONINIHS 8,449* 10,700* 9,411*      Chemistry Recent Labs  Lab 11/12/19 1045 11/13/19 0342 11/15/19 0612  NA 137 136 138  K 3.8 3.4* 4.2  CL 104 105 108  CO2 24 23 22   GLUCOSE 156* 192* 156*  BUN 10 14 8   CREATININE 0.79 0.76 0.79  CALCIUM 8.2* 7.8* 8.3*  PROT 5.6*  --   --   ALBUMIN 2.2*  --   --   AST 22  --   --   ALT 20  --   --   ALKPHOS 63  --   --   BILITOT 0.6  --   --   GFRNONAA >60 >60 >60  GFRAA >60 >60 >60  ANIONGAP 9 8 8      Hematology Recent Labs  Lab 11/13/19 0342 11/14/19 0401 11/14/19 0912 11/14/19 2043 11/15/19 0612  WBC 10.5 11.4*  --   --  9.9  RBC 2.89* 2.71*  --   --  2.81*  HGB 9.1* 8.5* 8.7* 8.5* 8.8*  HCT 28.9* 26.7* 27.6* 25.7* 27.4*  MCV 100.0 98.5  --   --  97.5  MCH 31.5 31.4  --   --  31.3  MCHC 31.5 31.8  --   --  32.1  RDW 13.5 13.5  --   --  13.2  PLT 378 417*  --   --  483*    BNPNo results for input(s): BNP, PROBNP in the last 168 hours.   DDimer No results for input(s): DDIMER in the last 168 hours.   Radiology    CT Angio Abd/Pel w/ and/or w/o  Result Date: 11/13/2019 CLINICAL DATA:  Bright red stools yesterday, tarry stools overnight EXAM: CTA ABDOMEN AND PELVIS WITHOUT AND WITH CONTRAST TECHNIQUE: Multidetector CT imaging of the abdomen and pelvis was performed using the standard protocol during bolus administration of intravenous contrast. Multiplanar reconstructed images and MIPs were obtained and reviewed to evaluate the vascular anatomy. CONTRAST:  165mL OMNIPAQUE IOHEXOL 350 MG/ML SOLN COMPARISON:  11/02/2019 FINDINGS: VASCULAR Aorta: Moderate calcified atheromatous plaque. No aneurysm, dissection, or stenosis. Celiac: Calcified ostial plaque without  high-grade stenosis. Patent without evidence of aneurysm, dissection, vasculitis or significant stenosis. SMA: Calcified ostial plaque without high-grade stenosis. Patent distally with classic distal branch anatomy. Renals: Single left, with calcified plaque extending from the origin 1.6 cm resulting in at least moderate stenosis. Duplicated right, inferior dominant with calcified ostial plaque resulting in short segment high-grade stenosis. IMA: Atheromatous, patent. Inflow: Eccentric calcified plaque in the proximal left common iliac artery resulting in stenosis of probable hemodynamic significance, patent distally. Scattered nonocclusive plaque through the right iliac arterial system. Proximal Outflow: Atheromatous, patent. Veins: Patent hepatic veins, portal vein, SMV, splenic vein, bilateral renal veins, IVC. No venous pathology identified. Review of the MIP images confirms the above findings. NON-VASCULAR Lower chest: Large hiatal hernia involving most of the stomach. Tiny bilateral pleural effusions. Calcified subpleural granuloma in right middle lobe (Im3,Se9) . coronary calcifications. Previous median sternotomy. Hepatobiliary: Stable cyst in hepatic segment 6. No new liver lesion. Gallbladder incompletely distended. No biliary ductal dilatation. Pancreas: Mild atrophy. Scattered coarse parenchymal calcifications suggesting chronic pancreatitis. No regional inflammatory changes or fluid collections. Spleen: Normal in size without focal abnormality. Small accessory splenule. Adrenals/Urinary Tract: Normal adrenal glands. Bilateral nephrolithiasis, largest stone 8 mm in the lower pole right renal collecting system. No hydronephrosis. Symmetric renal parenchymal enhancement. Stomach/Bowel: Stomach is nondilated, largely within hiatal hernia. Hyperdense material in the gastric fundus. Small bowel is nondistended. A knuckle of small bowel protrudes into a periumbilical hernia, without obstruction or  strangulation. Appendix not identified. The colon is nondilated., with multiple descending diverticula. No adjacent inflammatory/edematous change or abscess. No active extravasation. Lymphatic: No abdominal or pelvic adenopathy. Reproductive: Prostate is unremarkable. Other: No ascites. No free air. Musculoskeletal: Small periumbilical hernia containing a knuckle of small bowel, without obstruction or strangulation. Mild spondylitic changes in the lower lumbar spine. No fracture or worrisome bone lesion. IMPRESSION: 1. No active GI bleed identified. 2. Large hiatal hernia involving most of the stomach, incompletely included on the scan. 3. Small periumbilical hernia involving small bowel, without obstruction or strangulation. 4. Descending colon diverticulosis. 5. Right inferior renal artery ostial stenosis of probable hemodynamic significance. 6. Proximal left common iliac artery calcified stenosis; correlate with clinical symptomatology and ABIs. 7. Bilateral nephrolithiasis without hydronephrosis. 8. Tiny bilateral pleural effusions. 9. Coronary and Aortic Atherosclerosis (ICD10-170.0). Electronically Signed   By: Eden Emms.D.  On: 11/13/2019 12:28    Cardiac Studies   2D echocardiogram: IMPRESSIONS    1. Severely reduce LVEF, with akinesis and hypokinesis in septal and inferior walls. Changed from prior echo in 2019.  2. Left ventricular ejection fraction, by visual estimation, is 20 to 25%. The left ventricle has severely decreased function. There is no left ventricular hypertrophy.  3. Multiple segmental abnormalities exist. See findings.  4. Definity contrast agent was given IV to delineate the left ventricular endocardial borders.  5. Left ventricular diastolic parameters are indeterminate.  6. The left ventricle demonstrates regional wall motion abnormalities.  7. Global right ventricle has moderately reduced systolic function.The right ventricular size is normal. No increase in right  ventricular wall thickness.  8. Left atrial size was mildly dilated.  9. Right atrial size was mildly dilated. 10. The mitral valve is grossly normal. Mild mitral valve regurgitation. 11. The tricuspid valve is normal in structure. Tricuspid valve regurgitation is trivial. 12. The aortic valve is tricuspid. Aortic valve regurgitation is not visualized. Mild aortic valve sclerosis without stenosis. 13. The pulmonic valve was not well visualized. Pulmonic valve regurgitation NA.  FINDINGS  Left Ventricle: Left ventricular ejection fraction, by visual estimation, is 20 to 25%. The left ventricle has severely decreased function. Definity contrast agent was given IV to delineate the left ventricular endocardial borders. The left ventricle  demonstrates regional wall motion abnormalities. There is no left ventricular hypertrophy. Left ventricular diastolic parameters are indeterminate.    LV Wall Scoring: The basal and mid inferior septum and basal anteroseptal segment are akinetic. The entire inferior wall, mid and apical anterior septum, and basal and mid inferolateral wall are hypokinetic. All remaining scored segments are normal.  Right Ventricle: The right ventricular size is normal. No increase in right ventricular wall thickness. Global RV systolic function is has moderately reduced systolic function.  Left Atrium: Left atrial size was mildly dilated.  Right Atrium: Right atrial size was mildly dilated  Pericardium: There is no evidence of pericardial effusion.  Mitral Valve: The mitral valve is grossly normal. Mild mitral valve regurgitation.  Tricuspid Valve: The tricuspid valve is normal in structure. Tricuspid valve regurgitation is trivial.  Aortic Valve: The aortic valve is tricuspid. Aortic valve regurgitation is not visualized. Mild aortic valve sclerosis is present, with no evidence of aortic valve stenosis.  Pulmonic Valve: The pulmonic valve was not well  visualized. Pulmonic valve regurgitation NA.  Aorta: The aortic root and ascending aorta are structurally normal, with no evidence of dilitation.  Venous: The inferior vena cava was not well visualized.  IAS/Shunts: No atrial level shunt detected by color flow Doppler.  Cardiac Cath 11-12-2019: Conclusion    Ost RCA to Prox RCA lesion is 100% stenosed.  Ost LM to Mid LM lesion is 100% stenosed.  Prox Graft to Mid Graft lesion is 95% stenosed.  A stent was successfully placed.  Post intervention, there is a 0% residual stenosis.  Origin to Prox Graft lesion is 99% stenosed.  Post intervention, there is a 0% residual stenosis.  Dist LM to Prox LAD lesion is 100% stenosed with 100% stenosed side branch in Ost Cx to Dist Cx.  RPDA lesion is 100% stenosed.   Severe native CAD with total occlusion of the left main and ostial RCA.  Patent LIMA graft supplying the LAD  Patent SVG supplying the obtuse marginal vessel with collateralization to the distal diagonal vessel  Stents of thrombotic occlusion of the vein graft supplying a large dominant  RCA PDA system.  Successful PCI to the SVG supplying the PDA requiring extensive percutaneous thrombectomy ultimate DES stenting with a 3.5 x 38 mm Resolute stent with the stenoses being reduced to 0%.  There is abrupt cut off of the very distal PDA most likely due to embolic occlusion.  Hand-injection left ventriculography revealed an EF estimate at 35 to 40%.  There is moderate inferior hypocontractility and a small focal region of apical akinesis.  LVEDP is 10 mm.  RECOMMENDATION; The patient received ticagrelor upon leaving the lab with plans to discontinue cangrelor once in the ICU.  Plan to discontinue Aggrastat after 2 to 4 hours and bivalirudin upon completion of his current bag.  The case was discussed at length with Dr. Tamala Julian who follows the patient.  With his GI history other than 12 months of DAPT, will plan for  short-term antiplatelet therapy per Resolute Onyx stent data.    Patient Profile     85 y.o. male with hx CABG presents with acute inferior STEMI secondary to thrombotic SVG-RCA occlusion, treated with PCI  Assessment & Plan    1. CAD/Inferior STEMI: No chest pain. He has severe LV systolic dysfunction post MI. Continue Brilinta and statin. I have spoken to his cardiologist Dr. Tamala Julian and we decided that ASA will not be necessary given his risk of bleeding. I think his BP will support a low dose beta blocker. He had been on Toprol 25 mg at home.   2. GI Bleed: Known recent diverticulitis. Appreciate GI assistance. Hgb is stable. No active bleeding noted on CTA abd/pelvis. Will continue Protonix BID. If no plans for EGD, he can eat today.    3. Type II DM: Resume metformin  4. HTN: BP stable. Will not restart HCTZ or Norvasc. Will restart low dose Toprol  5. Ischemic cardiomyopathy: Severe LV systolic dysfunction post MI. Norvasc stopped due to LV dysfunction. Start Toprol 25 mg daily. No ARB with soft BP   For questions or updates, please contact [facility] Please consult [email or web address] for contact info under     Signed, Lauree Chandler, MD  11/15/2019, 8:06 AM

## 2019-11-16 ENCOUNTER — Telehealth: Payer: Self-pay | Admitting: Cardiology

## 2019-11-16 DIAGNOSIS — I5021 Acute systolic (congestive) heart failure: Secondary | ICD-10-CM

## 2019-11-16 LAB — BASIC METABOLIC PANEL
Anion gap: 9 (ref 5–15)
BUN: 10 mg/dL (ref 8–23)
CO2: 22 mmol/L (ref 22–32)
Calcium: 8.4 mg/dL — ABNORMAL LOW (ref 8.9–10.3)
Chloride: 107 mmol/L (ref 98–111)
Creatinine, Ser: 0.78 mg/dL (ref 0.61–1.24)
GFR calc Af Amer: >60 mL/min (ref >60)
GFR calc non Af Amer: >60 mL/min (ref >60)
Glucose, Bld: 134 mg/dL — ABNORMAL HIGH (ref 70–99)
Potassium: 3.9 mmol/L (ref 3.5–5.1)
Sodium: 138 mmol/L (ref 135–145)

## 2019-11-16 LAB — CBC
HCT: 28.2 % — ABNORMAL LOW (ref 39.0–52.0)
Hemoglobin: 8.8 g/dL — ABNORMAL LOW (ref 13.0–17.0)
MCH: 31.2 pg (ref 26.0–34.0)
MCHC: 31.2 g/dL (ref 30.0–36.0)
MCV: 100 fL (ref 80.0–100.0)
Platelets: 503 10*3/uL — ABNORMAL HIGH (ref 150–400)
RBC: 2.82 MIL/uL — ABNORMAL LOW (ref 4.22–5.81)
RDW: 13.4 % (ref 11.5–15.5)
WBC: 10.3 10*3/uL (ref 4.0–10.5)
nRBC: 0 % (ref 0.0–0.2)

## 2019-11-16 MED ORDER — LOSARTAN POTASSIUM 25 MG PO TABS
12.5000 mg | ORAL_TABLET | Freq: Every day | ORAL | Status: DC
  Administered 2019-11-16: 12.5 mg via ORAL
  Filled 2019-11-16: qty 1

## 2019-11-16 MED ORDER — ROSUVASTATIN CALCIUM 20 MG PO TABS: 20.0000 mg | ORAL_TABLET | Freq: Every day | ORAL | 1 refills | Status: DC

## 2019-11-16 MED ORDER — NITROGLYCERIN 0.4 MG SL SUBL: 0.4000 mg | SUBLINGUAL_TABLET | SUBLINGUAL | 2 refills | Status: DC | PRN

## 2019-11-16 MED ORDER — LOSARTAN POTASSIUM 25 MG PO TABS: 12.5000 mg | ORAL_TABLET | Freq: Every day | ORAL | 1 refills | Status: DC

## 2019-11-16 MED ORDER — TICAGRELOR 90 MG PO TABS: 90.0000 mg | ORAL_TABLET | Freq: Two times a day (BID) | ORAL | 1 refills | Status: DC

## 2019-11-16 MED ORDER — PANTOPRAZOLE SODIUM 40 MG PO TBEC: DELAYED_RELEASE_TABLET | ORAL | 0 refills | Status: DC

## 2019-11-16 MED FILL — LOSARTAN POTASSIUM 25 MG TA: 25 | 30 days supply | Qty: 15 | Fill #0

## 2019-11-16 MED FILL — ROSUVASTATIN CALCIUM 20 MG: 20 | 30 days supply | Qty: 30 | Fill #0

## 2019-11-16 MED FILL — PANTOPRAZOLE SOD DR 40 MG T: 40 | 30 days supply | Qty: 60 | Fill #0

## 2019-11-16 MED FILL — BRILINTA 90 MG TABLET: 90 | 30 days supply | Qty: 60 | Fill #0

## 2019-11-16 MED FILL — NITROGLYCERIN 0.4 MG TAB SL: 0.4 | 8 days supply | Qty: 25 | Fill #0

## 2019-11-16 NOTE — Progress Notes (Signed)
Sheath Removal Right Groin Site Prior to removal- Level 0 Pressure applied for 20 minutes [id number] Manual hold Patient status during removal- Alert with no complications Post removal Level 0 Removal instructions given and repeated with acceptance.    Kathleene Hazel RN

## 2019-11-16 NOTE — Discharge Summary (Signed)
Discharge Summary    Patient ID: Kenneth [name],  MRN: [id number], DOB/AGE: Oct 26, 1928 85 y.o.  Admit date: 11/12/2019 Discharge date: 11/16/2019  Primary Care Provider: Lavone [name] Primary Cardiologist: Kenneth Grooms, MD  Discharge Diagnoses    Principal Problem:   Acute myocardial infarction [facility]) Active Problems:   HTN (hypertension)   Coronary artery disease involving coronary bypass graft of native heart without angina pectoris   Hyperlipidemia with target LDL less than 70   Type 2 diabetes mellitus with complication, without long-term current use of insulin (HCC)   Acute ST elevation myocardial infarction (STEMI) of posterior wall (HCC)   Acute systolic heart failure (HCC)   Allergies Allergies  Allergen Reactions  . Lasix [Furosemide] Other (See Comments)    aches  . Spironolactone Other (See Comments)    Red, puffy, eyes, almost hospitalized  . Aspirin Other (See Comments)    Bleeding.    . Lipitor [Atorvastatin] Other (See Comments)    tired  . Lopressor [Metoprolol Tartrate] Other (See Comments)    Sleepy all the time 11.27.2020 Patient is currently taking Lopressor.  [name] Penicillins Hives and Rash    Has patient had a PCN reaction causing immediate rash, facial/tongue/throat swelling, SOB or lightheadedness with hypotension: No Has patient had a PCN reaction causing severe rash involving mucus membranes or skin necrosis: No Has patient had a PCN reaction that required hospitalization No Has patient had a PCN reaction occurring within the last 10 years: Yes If all of the above answers are "NO", then may proceed with Cephalosporin use.     Diagnostic Studies/Procedures    Cath: 11/12/19   Ost RCA to Prox RCA lesion is 100% stenosed.  Ost LM to Mid LM lesion is 100% stenosed.  Prox Graft to Mid Graft lesion is 95% stenosed.  A stent was successfully placed.  Post intervention, there is a 0% residual stenosis.  Origin to Prox Graft lesion  is 99% stenosed.  Post intervention, there is a 0% residual stenosis.  Dist LM to Prox LAD lesion is 100% stenosed with 100% stenosed side branch in Ost Cx to Dist Cx.  RPDA lesion is 100% stenosed.   Severe native CAD with total occlusion of the left main and ostial RCA.  Patent LIMA graft supplying the LAD  Patent SVG supplying the obtuse marginal vessel with collateralization to the distal diagonal vessel  Stents of thrombotic occlusion of the vein graft supplying a large dominant RCA PDA system.  Successful PCI to the SVG supplying the PDA requiring extensive percutaneous thrombectomy ultimate DES stenting with a 3.5 x 38 mm Resolute stent with the stenoses being reduced to 0%.  There is abrupt cut off of the very distal PDA most likely due to embolic occlusion.  Hand-injection left ventriculography revealed an EF estimate at 35 to 40%.  There is moderate inferior hypocontractility and a small focal region of apical akinesis.  LVEDP is 10 mm.  RECOMMENDATION; The patient received ticagrelor upon leaving the lab with plans to discontinue cangrelor once in the ICU.  Plan to discontinue Aggrastat after 2 to 4 hours and bivalirudin upon completion of his current bag.  The case was discussed at length with Kenneth [name] who follows the patient.  With his GI history other than 12 months of DAPT, will plan for short-term antiplatelet therapy per Resolute Onyx stent data.  Diagnostic Dominance: Right  Intervention   TTE: 11/12/19  IMPRESSIONS    1. Severely reduce LVEF,  with akinesis and hypokinesis in septal and inferior walls. Changed from prior echo in 2019.  2. Left ventricular ejection fraction, by visual estimation, is 20 to 25%. The left ventricle has severely decreased function. There is no left ventricular hypertrophy.  3. Multiple segmental abnormalities exist. See findings.  4. Definity contrast agent was given IV to delineate the left ventricular endocardial borders.   5. Left ventricular diastolic parameters are indeterminate.  6. The left ventricle demonstrates regional wall motion abnormalities.  7. Global right ventricle has moderately reduced systolic function.The right ventricular size is normal. No increase in right ventricular wall thickness.  8. Left atrial size was mildly dilated.  9. Right atrial size was mildly dilated. 10. The mitral valve is grossly normal. Mild mitral valve regurgitation. 11. The tricuspid valve is normal in structure. Tricuspid valve regurgitation is trivial. 12. The aortic valve is tricuspid. Aortic valve regurgitation is not visualized. Mild aortic valve sclerosis without stenosis. 13. The pulmonic valve was not well visualized. Pulmonic valve regurgitation NA. _____________   History of Present Illness     Kenneth [name] is a 85 y.o. male with history of CAD s/p remote CABG, hypothyroidism, hypertension, hyperlipidemia, diabetes mellitus and GI bleed presented by EMS as a code STEMI. Echocardiogram March 2019 showed LV function of 60 to 65% and grade 1 diastolic dysfunction. Patient was last seen by Kenneth [name] August 2020.  He was doing relatively well.  Kenneth [name] most recently admitted 11/27-11/29/20 with diverticulitis and large hiatal hernia.[name]  He initially presented with left lower quadrant pain, anorexia and vomiting.  Prior history of colectomy 2 to 3 years ago.  He was treated conservatively with antibiotic.  Plan for surgical consultation versus GI referral as outpatient if reoccurrence.  He was doing relatively well but not able to tolerate his antibiotic (metronidazole and ofloxacin).  He has intermittent nausea and abdominal pain.  Per daughter he stopped his antibiotic 3 days ago 12/4.  Yesterday patient had severe fatigue, nausea and intermittent shortness of breath.  His symptoms worsened today and EMS was called and found to have inferior STEMI.  He was brought directly to Cath Lab.  Underwent stenting to his  RCA.  There was a delay in the PCI as Kenneth [name] needed to discuss goal of care with family and high decision making with his regular cardiologist Kenneth [name].   Hospital Course     Consultants: GI  1. CAD/Inferior STEMI: Underwent successful PCI/DES to the SVG-PDA which required extensive thrombectomy. He has severe LV systolic dysfunction post MI, EF 20-25%. HsT 10700. Continue Brilinta and statin. Decision made to stop ASA given his increased risk of GI bleeding.  -- Tolerated the addition of losartan and Toprol, Continued on statin.   2. GI Bleed: Known recent diverticulitis. Had BM with BRB. Hgb dropped from 11.2-->9.2. No active bleeding noted on CTA abd/pelvis. He was not considered a candidate for colonoscopy with his recent diverticulitis and need for antiplatelet therapy. Per GI recommendations he was given Protonix IV BID while in patient. Transitioned to 40mg  PO BID for 4 weeks, then 40mg  daily for additional 4 weeks.    3. Type II DM: Hgb A1c 6.8. Resumed on metformin   4. HTN: BP stable. Tolerating BB, and addition of low dose ARB.    5. Ischemic cardiomyopathy: Severe LV systolic dysfunction post MI with EF of 20-25%. Norvasc was stopped due to LV dysfunction. Started on Toprol XL and low dose losartan 12.5mg  daily. No signs of  volume overload.   6. HL: LDL 50. Continued on high dose statin   General: Well developed, well nourished, male appearing in no acute distress. Head: Normocephalic, atraumatic.  Neck: Supple without bruits, JVD. Lungs:  Resp regular and unlabored, CTA. Heart: RRR, S1, S2, no S3, S4, or murmur; no rub. Abdomen: Soft, non-tender, non-distended with normoactive bowel sounds. No hepatomegaly. No rebound/guarding. No obvious abdominal masses. Extremities: No clubbing, cyanosis, edema. Distal pedal pulses are 2+ bilaterally.  Neuro: Alert and oriented X 3. Moves all extremities spontaneously. Psych: Normal affect.  [name] was seen by Dr. Angelena Form  and determined stable for discharge home. Follow up in the office has been arranged. Medications are listed below.   _____________  Discharge Vitals Blood pressure 122/69, pulse 84, temperature 98 F (36.7 C), temperature source Oral, resp. rate 19, height 5\' 10"  (1.778 m), weight 67.2 kg, SpO2 98 %.  Filed Weights   11/14/19 0600 11/15/19 0250 11/16/19 0523  Weight: 69.3 kg 68.9 kg 67.2 kg    Labs & Radiologic Studies    CBC Recent Labs    11/15/19 0612 11/15/19 0821 11/15/19 2326  WBC 9.9  --  10.3  HGB 8.8* 9.1* 8.8*  HCT 27.4* 28.4* 28.2*  MCV 97.5  --  100.0  PLT 483*  --  [id number]*   Basic Metabolic Panel Recent Labs    11/15/19 0612 11/16/19 0143  NA 138 138  K 4.2 3.9  CL 108 107  CO2 22 22  GLUCOSE 156* 134*  BUN 8 10  CREATININE 0.79 0.78  CALCIUM 8.3* 8.4*   Liver Function Tests No results for input(s): AST, ALT, ALKPHOS, BILITOT, PROT, ALBUMIN in the last 72 hours. No results for input(s): LIPASE, AMYLASE in the last 72 hours. Cardiac Enzymes No results for input(s): CKTOTAL, CKMB, CKMBINDEX, TROPONINI in the last 72 hours. BNP Invalid input(s): POCBNP D-Dimer No results for input(s): DDIMER in the last 72 hours. Hemoglobin A1C No results for input(s): HGBA1C in the last 72 hours. Fasting Lipid Panel No results for input(s): CHOL, HDL, LDLCALC, TRIG, CHOLHDL, LDLDIRECT in the last 72 hours. Thyroid Function Tests No results for input(s): TSH, T4TOTAL, T3FREE, THYROIDAB in the last 72 hours.  Invalid input(s): FREET3 _____________  CT Abdomen Pelvis W Contrast  Result Date: 11/02/2019 CLINICAL DATA:  Abdominal distension. EXAM: CT ABDOMEN AND PELVIS WITH CONTRAST TECHNIQUE: Multidetector CT imaging of the abdomen and pelvis was performed using the standard protocol following bolus administration of intravenous contrast. CONTRAST:  121mL OMNIPAQUE IOHEXOL 300 MG/ML  SOLN COMPARISON:  09/10/2017 FINDINGS: Lower chest: Large hiatal hernia similar to  prior study. Signs of mitral annular calcification and coronary artery disease. No signs of consolidation or evidence of pleural effusion. Hepatobiliary: Signs of hepatic cysts. No suspicious focal hepatic lesion. Gallbladder mildly distended without signs of pericholecystic fluid. No signs of biliary ductal dilation. Pancreas: Scattered calcific changes of the pancreas similar to prior study. Subtle 9 mm low-attenuation focus in the pancreas appears cystic, potentially related to prior pancreatitis given the presence of calcifications. Spleen: Normal in size without focal abnormality. Adrenals/Urinary Tract: Mild thickening of the adrenal glands. Nonobstructing calculus lower pole right kidney measuring 8 mm. No signs of hydronephrosis. No signs of ureteral calculus on today's exam. No suspicious renal lesion. Stomach/Bowel: Organo-axial gastric volvulus without adjacent gastric stranding, stomach is incompletely imaged. Configuration unchanged since prior exam. Transverse segmental colonic thickening with signs of diverticulosis. Signs of left hemicolectomy. No signs of pneumatosis. Normal appendix. Vascular/Lymphatic:  Calcified atherosclerosis of the abdominal aorta. No signs of aneurysm. No signs of adenopathy. Reproductive: Prostate unremarkable by CT. Other: No free air or abscess. Musculoskeletal: No sign of acute or destructive bone process. IMPRESSION: 1. Segmental colitis in the setting of diverticulosis in the transverse colon, likely diverticulitis. Period colonic thickening is noted with adjacent small lymph nodes but no adenopathy. CT follow-up may be helpful to ensure resolution of changes and determine whether direct visualization may be warranted given changes of colonic thickening. 2. Signs of left hemicolectomy. 3. Stable large hiatal hernia. 4. Nonobstructing right lower pole renal calculus. 5. Signs of chronic calcific pancreatitis. Small cystic focus suggested in the midportion of the pancreas.  CT follow-up could be utilized for this finding as well when assessing colonic findings. 6. Aortic atherosclerosis. 7. Signs of mitral annular calcification and coronary artery disease. Aortic Atherosclerosis (ICD10-I70.0). Electronically Signed   By: Zetta Bills M.D.   On: 11/02/2019 12:26   CARDIAC CATHETERIZATION  Result Date: 11/12/2019  Ost RCA to Prox RCA lesion is 100% stenosed.  Ost LM to Mid LM lesion is 100% stenosed.  Prox Graft to Mid Graft lesion is 95% stenosed.  A stent was successfully placed.  Post intervention, there is a 0% residual stenosis.  Origin to Prox Graft lesion is 99% stenosed.  Post intervention, there is a 0% residual stenosis.  Dist LM to Prox LAD lesion is 100% stenosed with 100% stenosed side branch in Ost Cx to Dist Cx.  RPDA lesion is 100% stenosed.  Severe native CAD with total occlusion of the left main and ostial RCA. Patent LIMA graft supplying the LAD Patent SVG supplying the obtuse marginal vessel with collateralization to the distal diagonal vessel Stents of thrombotic occlusion of the vein graft supplying a large dominant RCA PDA system. Successful PCI to the SVG supplying the PDA requiring extensive percutaneous thrombectomy ultimate DES stenting with a 3.5 x 38 mm Resolute stent with the stenoses being reduced to 0%.  There is abrupt cut off of the very distal PDA most likely due to embolic occlusion. Hand-injection left ventriculography revealed an EF estimate at 35 to 40%.  There is moderate inferior hypocontractility and a small focal region of apical akinesis.  LVEDP is 10 mm. RECOMMENDATION; The patient received ticagrelor upon leaving the lab with plans to discontinue cangrelor once in the ICU.  Plan to discontinue Aggrastat after 2 to 4 hours and bivalirudin upon completion of his current bag.  The case was discussed at length with Kenneth [name] who follows the patient.  With his GI history other than 12 months of DAPT, will plan for short-term  antiplatelet therapy per Resolute Onyx stent data.   ECHOCARDIOGRAM COMPLETE  Result Date: 11/12/2019   ECHOCARDIOGRAM REPORT   Patient Name:   Kenneth [name] Date of Exam: 11/12/2019 Medical Rec #:  [id number]        Height:       70.0 in Accession #:    [id number]       Weight:       147.7 lb Date of Birth:  02/02/1928        BSA:          1.84 m Patient Age:    42 years         BP:           149/81 mmHg Patient Gender: M                HR:  98 bpm. Exam Location:  Inpatient Procedure: 2D Echo and Intracardiac Opacification Agent Indications:    Acute myocardial infarction 410  History:        Patient has prior history of Echocardiogram examinations, most                 recent 02/21/2018. CAD, Prior CABG; Risk Factors:Hypertension,                 Dyslipidemia and Diabetes.  Sonographer:    Mikki Santee RDCS (AE) Referring Phys: [place]  1. Severely reduce LVEF, with akinesis and hypokinesis in septal and inferior walls. Changed from prior echo in 2019.  2. Left ventricular ejection fraction, by visual estimation, is 20 to 25%. The left ventricle has severely decreased function. There is no left ventricular hypertrophy.  3. Multiple segmental abnormalities exist. See findings.  4. Definity contrast agent was given IV to delineate the left ventricular endocardial borders.  5. Left ventricular diastolic parameters are indeterminate.  6. The left ventricle demonstrates regional wall motion abnormalities.  7. Global right ventricle has moderately reduced systolic function.The right ventricular size is normal. No increase in right ventricular wall thickness.  8. Left atrial size was mildly dilated.  9. Right atrial size was mildly dilated. 10. The mitral valve is grossly normal. Mild mitral valve regurgitation. 11. The tricuspid valve is normal in structure. Tricuspid valve regurgitation is trivial. 12. The aortic valve is tricuspid. Aortic valve regurgitation is not  visualized. Mild aortic valve sclerosis without stenosis. 13. The pulmonic valve was not well visualized. Pulmonic valve regurgitation NA. FINDINGS  Left Ventricle: Left ventricular ejection fraction, by visual estimation, is 20 to 25%. The left ventricle has severely decreased function. Definity contrast agent was given IV to delineate the left ventricular endocardial borders. The left ventricle demonstrates regional wall motion abnormalities. There is no left ventricular hypertrophy. Left ventricular diastolic parameters are indeterminate.  LV Wall Scoring: The basal and mid inferior septum and basal anteroseptal segment are akinetic. The entire inferior wall, mid and apical anterior septum, and basal and mid inferolateral wall are hypokinetic. All remaining scored segments are normal. Right Ventricle: The right ventricular size is normal. No increase in right ventricular wall thickness. Global RV systolic function is has moderately reduced systolic function. Left Atrium: Left atrial size was mildly dilated. Right Atrium: Right atrial size was mildly dilated Pericardium: There is no evidence of pericardial effusion. Mitral Valve: The mitral valve is grossly normal. Mild mitral valve regurgitation. Tricuspid Valve: The tricuspid valve is normal in structure. Tricuspid valve regurgitation is trivial. Aortic Valve: The aortic valve is tricuspid. Aortic valve regurgitation is not visualized. Mild aortic valve sclerosis is present, with no evidence of aortic valve stenosis. Pulmonic Valve: The pulmonic valve was not well visualized. Pulmonic valve regurgitation NA. Aorta: The aortic root and ascending aorta are structurally normal, with no evidence of dilitation. Venous: The inferior vena cava was not well visualized. IAS/Shunts: No atrial level shunt detected by color flow Doppler.  LEFT VENTRICLE PLAX 2D LVIDd:         4.44 cm  Diastology LVIDs:         3.91 cm  LV e' lateral:   12.00 cm/s LV PW:         0.99 cm  LV  E/e' lateral: 5.1 LV IVS:        0.77 cm  LV e' medial:    6.85 cm/s LVOT diam:     2.30  cm  LV E/e' medial:  8.9 LV SV:         23 ml LV SV Index:   12.82 LVOT Area:     4.15 cm  RIGHT VENTRICLE RV S prime:     5.59 cm/s TAPSE (M-mode): 1.1 cm LEFT ATRIUM             Index       RIGHT ATRIUM           Index LA diam:        3.50 cm 1.91 cm/m  RA Area:     17.90 cm LA Vol (A2C):   74.5 ml 40.60 ml/m RA Volume:   49.70 ml  27.08 ml/m LA Vol (A4C):   66.9 ml 36.46 ml/m LA Biplane Vol: 68.9 ml 37.55 ml/m  AORTIC VALVE LVOT Vmax:   61.30 cm/s LVOT Vmean:  40.600 cm/s LVOT VTI:    0.101 m  AORTA Ao Root diam: 3.30 cm MITRAL VALVE                         TRICUSPID VALVE MV Area (PHT): 4.80 cm              TR Peak grad:   7.3 mmHg MV PHT:        45.82 msec            TR Vmax:        141.00 cm/s MV Decel Time: 158 msec MV E velocity: 61.20 cm/s  103 cm/s  SHUNTS MV A velocity: 102.00 cm/s 70.3 cm/s Systemic VTI:  0.10 m MV E/A ratio:  0.60        1.5       Systemic Diam: 2.30 cm  Buford Dresser MD Electronically signed by Buford Dresser MD Signature Date/Time: 11/12/2019/6:55:20 PM    Final    CT Angio Abd/Pel w/ and/or w/o  Result Date: 11/13/2019 CLINICAL DATA:  Bright red stools yesterday, tarry stools overnight EXAM: CTA ABDOMEN AND PELVIS WITHOUT AND WITH CONTRAST TECHNIQUE: Multidetector CT imaging of the abdomen and pelvis was performed using the standard protocol during bolus administration of intravenous contrast. Multiplanar reconstructed images and MIPs were obtained and reviewed to evaluate the vascular anatomy. CONTRAST:  129mL OMNIPAQUE IOHEXOL 350 MG/ML SOLN COMPARISON:  11/02/2019 FINDINGS: VASCULAR Aorta: Moderate calcified atheromatous plaque. No aneurysm, dissection, or stenosis. Celiac: Calcified ostial plaque without high-grade stenosis. Patent without evidence of aneurysm, dissection, vasculitis or significant stenosis. SMA: Calcified ostial plaque without high-grade stenosis.  Patent distally with classic distal branch anatomy. Renals: Single left, with calcified plaque extending from the origin 1.6 cm resulting in at least moderate stenosis. Duplicated right, inferior dominant with calcified ostial plaque resulting in short segment high-grade stenosis. IMA: Atheromatous, patent. Inflow: Eccentric calcified plaque in the proximal left common iliac artery resulting in stenosis of probable hemodynamic significance, patent distally. Scattered nonocclusive plaque through the right iliac arterial system. Proximal Outflow: Atheromatous, patent. Veins: Patent hepatic veins, portal vein, SMV, splenic vein, bilateral renal veins, IVC. No venous pathology identified. Review of the MIP images confirms the above findings. NON-VASCULAR Lower chest: Large hiatal hernia involving most of the stomach. Tiny bilateral pleural effusions. Calcified subpleural granuloma in right middle lobe (Im3,Se9) . coronary calcifications. Previous median sternotomy. Hepatobiliary: Stable cyst in hepatic segment 6. No new liver lesion. Gallbladder incompletely distended. No biliary ductal dilatation. Pancreas: Mild atrophy. Scattered coarse parenchymal calcifications suggesting chronic pancreatitis. No regional inflammatory changes or fluid  collections. Spleen: Normal in size without focal abnormality. Small accessory splenule. Adrenals/Urinary Tract: Normal adrenal glands. Bilateral nephrolithiasis, largest stone 8 mm in the lower pole right renal collecting system. No hydronephrosis. Symmetric renal parenchymal enhancement. Stomach/Bowel: Stomach is nondilated, largely within hiatal hernia. Hyperdense material in the gastric fundus. Small bowel is nondistended. A knuckle of small bowel protrudes into a periumbilical hernia, without obstruction or strangulation. Appendix not identified. The colon is nondilated., with multiple descending diverticula. No adjacent inflammatory/edematous change or abscess. No active  extravasation. Lymphatic: No abdominal or pelvic adenopathy. Reproductive: Prostate is unremarkable. Other: No ascites. No free air. Musculoskeletal: Small periumbilical hernia containing a knuckle of small bowel, without obstruction or strangulation. Mild spondylitic changes in the lower lumbar spine. No fracture or worrisome bone lesion. IMPRESSION: 1. No active GI bleed identified. 2. Large hiatal hernia involving most of the stomach, incompletely included on the scan. 3. Small periumbilical hernia involving small bowel, without obstruction or strangulation. 4. Descending colon diverticulosis. 5. Right inferior renal artery ostial stenosis of probable hemodynamic significance. 6. Proximal left common iliac artery calcified stenosis; correlate with clinical symptomatology and ABIs. 7. Bilateral nephrolithiasis without hydronephrosis. 8. Tiny bilateral pleural effusions. 9. Coronary and Aortic Atherosclerosis (ICD10-170.0). Electronically Signed   By: Lucrezia Europe M.D.   On: 11/13/2019 12:28   Disposition   Pt is being discharged home today in good condition.  Follow-up Plans & Appointments    Follow-up Information    Isaiah Serge, NP Follow up on 11/28/2019.   Specialties: Cardiology, Radiology Why: at 11:30am for your follow up appt.  Contact information: [place] [address] [phone]          Discharge Instructions    Amb Referral to Cardiac Rehabilitation   Complete by: As directed    Diagnosis:  STEMI Coronary Stents     After initial evaluation and assessments completed: Virtual Based Care may be provided alone or in conjunction with Phase 2 Cardiac Rehab based on patient barriers.: Yes   Diet - low sodium heart healthy   Complete by: As directed    Discharge instructions   Complete by: As directed    Groin Site Care Refer to this sheet in the next few weeks. These instructions provide you with information on caring for yourself after your  procedure. Your caregiver may also give you more specific instructions. Your treatment has been planned according to current medical practices, but problems sometimes occur. Call your caregiver if you have any problems or questions after your procedure. HOME CARE INSTRUCTIONS You may shower 24 hours after the procedure. Remove the bandage (dressing) and gently wash the site with plain soap and water. Gently pat the site dry.  Do not apply powder or lotion to the site.  Do not sit in a bathtub, swimming pool, or whirlpool for 5 to 7 days.  No bending, squatting, or lifting anything over 10 pounds (4.5 kg) as directed by your caregiver.  Inspect the site at least twice daily.  Do not drive home if you are discharged the same day of the procedure. Have someone else drive you.  You may drive 24 hours after the procedure unless otherwise instructed by your caregiver.  What to expect: Any bruising will usually fade within 1 to 2 weeks.  Blood that collects in the tissue (hematoma) may be painful to the touch. It should usually decrease in size and tenderness within 1 to 2 weeks.  [place]  IF: You have unusual pain at the groin site or down the affected leg.  You have redness, warmth, swelling, or pain at the groin site.  You have drainage (other than a small amount of blood on the dressing).  You have chills.  You have a fever or persistent symptoms for more than 72 hours.  You have a fever and your symptoms suddenly get worse.  Your leg becomes pale, cool, tingly, or numb.  You have heavy bleeding from the site. Hold pressure on the site.   PLEASE DO NOT MISS ANY DOSES OF YOUR BRILINTA!!!!! Also keep a log of you blood pressures and bring back to your follow up appt. Please call the office with any questions.   Patients taking blood thinners should generally stay away from medicines like ibuprofen, Advil, Motrin, naproxen, and Aleve due to risk of stomach bleeding. You may  take Tylenol as directed or talk to your primary doctor about alternatives.  No driving for 2 weeks. No lifting over 10 lbs for 4 weeks. Keep procedure site clean & dry. If you notice increased pain, swelling, bleeding or pus, call/return!  You may shower, but no soaking baths/hot tubs/pools for 1 week.   Increase activity slowly   Complete by: As directed        Discharge Medications     Medication List    STOP taking these medications   amLODipine 5 MG tablet Commonly known as: NORVASC   ciprofloxacin 500 MG tablet Commonly known as: CIPRO   hydrochlorothiazide 25 MG tablet Commonly known as: HYDRODIURIL   metroNIDAZOLE 500 MG tablet Commonly known as: FLAGYL   simvastatin 20 MG tablet Commonly known as: ZOCOR     TAKE these medications   acetaminophen 325 MG tablet Commonly known as: TYLENOL Take 325 mg by mouth every 6 (six) hours as needed for mild pain.   ferrous sulfate 325 (65 FE) MG tablet Take 325 mg by mouth daily with breakfast.   isosorbide mononitrate 60 MG 24 hr tablet Commonly known as: IMDUR Take 60 mg by mouth every evening.   latanoprost 0.005 % ophthalmic solution Commonly known as: XALATAN Place 1 drop into both eyes at bedtime.   levothyroxine 75 MCG tablet Commonly known as: SYNTHROID Take 50 mcg by mouth daily before breakfast.   losartan 25 MG tablet Commonly known as: COZAAR Take 0.5 tablets (12.5 mg total) by mouth daily.   metFORMIN 1000 MG tablet Commonly known as: GLUCOPHAGE Take 500 mg by mouth daily with breakfast.   metoprolol succinate 50 MG 24 hr tablet Commonly known as: TOPROL-XL TAKE ONE-HALF (1/2) TABLET BY MOUTH DAILY WITH OR AFTER A MEAL What changed:   how much to take  how to take this  when to take this  additional instructions   multivitamin with minerals Tabs tablet Take 1 tablet by mouth daily.   nitroGLYCERIN 0.4 MG SL tablet Commonly known as: Nitrostat Place 1 tablet (0.4 mg total) under  the tongue every 5 (five) minutes as needed.   ondansetron 4 MG tablet Commonly known as: ZOFRAN Take 1 tablet (4 mg total) by mouth every 6 (six) hours as needed for nausea.   pantoprazole 40 MG tablet Commonly known as: Protonix Take 1 tablet (40 mg total) by mouth 2 (two) times daily for 30 days, THEN 1 tablet (40 mg total) daily. Start taking on: November 16, 2019 What changed: See the new instructions.   potassium chloride 10 MEQ tablet Commonly known as: KLOR-CON Take 10  mEq by mouth every Monday, Wednesday, and Friday.   rosuvastatin 20 MG tablet Commonly known as: CRESTOR Take 1 tablet (20 mg total) by mouth daily at 6 PM.   SOOTHE XP OP Apply 1 drop to eye as needed (dry eyes).   ticagrelor 90 MG Tabs tablet Commonly known as: BRILINTA Take 1 tablet (90 mg total) by mouth 2 (two) times daily.   VITAMIN B-12 PO Take 1 tablet by mouth daily.        Yes                               AHA/ACC Clinical Performance & Quality Measures: 1. Aspirin prescribed? - No - Monotherapy with Brilinta given high risk of GI bleeding 2. ADP Receptor Inhibitor (Plavix/Clopidogrel, Brilinta/Ticagrelor or Effient/Prasugrel) prescribed (includes medically managed patients)? - Yes 3. Beta Blocker prescribed? - Yes 4. High Intensity Statin (Lipitor 40-80mg  or Crestor 20-40mg ) prescribed? - Yes 5. EF assessed during THIS hospitalization? - Yes 6. For EF <40%, was ACEI/ARB prescribed? - Yes 7. For EF <40%, Aldosterone Antagonist (Spironolactone or Eplerenone) prescribed? - No - Reason:  consider at outpatient follow up 8. Cardiac Rehab Phase II ordered (Included Medically managed Patients)? - Yes      Outstanding Labs/Studies   N/a  Duration of Discharge Encounter   Greater than 30 minutes including physician time.  Signed, Reino Bellis NP-C 11/16/2019, 10:49 AM   I have personally seen and examined this patient. I agree with the assessment and plan as outlined above. He is  doing well. Mild chest pain when he felt anxious but otherwise feeling well. BP stable. Will discharge home today on Losartan, Imdur, toprol, statin and Brilinta. Will not send home on ASA given elevated bleeding risk. Continue protonix. GI follow up as outpatient.   Lauree Chandler 11/16/2019 10:49 AM

## 2019-11-16 NOTE — Care Management Important Message (Signed)
Important Message  Patient Details  Name: Kenneth [name] MRN: [id number] Date of Birth: 04/25/1928   Medicare Important Message Given:  Yes     [name] 11/16/2019, 2:45 PM

## 2019-11-16 NOTE — Progress Notes (Signed)
Patient complained of chest tightness this morning.  Blood pressure 122/69, EKG done and transmitted to chart.  After EKG completed patient stated he thinks the chest tightness is related to anxiety.  RN text paged Cardiology via [place] with this information.  Day shift RN updated during bedside report.

## 2019-11-16 NOTE — Telephone Encounter (Signed)
**Note De-Identified Kenneth [name] Obfuscation** The pt is being discharged from the hospital today. We will call him on Monday 11/19/2019.

## 2019-11-16 NOTE — Telephone Encounter (Signed)
TOC Patient- Please call Patient- Pt has an appointment with Cecilie Kicks on 11-28-19

## 2019-11-16 NOTE — TOC Initial Note (Signed)
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 Patient in need of home health physical therapy. Patient had transitioned home before CM could speak with him. Patient was picked up by daughter; therefore CM reached out to daughter [place]. CM did discuss home health agencies. Daughter chose [facility]- insurance is in network and can see the patient within 24-48 hours of transitioning home. Daughter appreciative of time spent. No further needs from CM at this time.                  Expected Discharge Plan: [place] Barriers to Discharge: No Barriers Identified   Patient Goals and CMS Choice Patient states their goals for this hospitalization and ongoing recovery are:: "to return home" CMS Medicare.gov Compare Post Acute Care list provided to:: (Patient had left the building-choice offered over phone with daughter.) Choice offered to / list presented to : NA  Expected Discharge Plan and Services Expected Discharge Plan: [place] In-house Referral: NA Discharge Planning Services: CM Consult Post Acute Care Choice: [place] arrangements for the past 2 months: Single Family Home Expected Discharge Date: 11/16/19                         HH Arranged: PT HH Agency: [facility] Date [place]: 11/16/19 Time HH Agency Contacted: [phone] Representative spoke with at [place] Arrangements/Services Living arrangements for the past 2 months: [place] with:: Spouse Patient language and need for interpreter reviewed:: Yes Do you feel safe going back to the  place where you live?: Yes      Need for Family Participation in Patient Care: Yes (Comment) Care giver support system in place?: Yes (comment)   Criminal Activity/Legal Involvement Pertinent to Current Situation/Hospitalization: No - Comment as needed  Activities of Daily Living Home Assistive Devices/Equipment: None ADL Screening (condition at time of admission) Patient's cognitive ability adequate to safely complete daily activities?: Yes Is the patient deaf or have difficulty hearing?: Yes Does the patient have difficulty seeing, even when wearing glasses/contacts?: No Does the patient have difficulty concentrating, remembering, or making decisions?: No Patient able to express need for assistance with ADLs?: Yes Does the patient have difficulty dressing or bathing?: No Independently performs ADLs?: Yes (appropriate for developmental age) Does the patient have difficulty walking or climbing stairs?: No Weakness of Legs: None Weakness of Arms/Hands: None  Permission Sought/Granted Permission sought to share information with : Family Supports Permission granted to share information with : Yes, Verbal Permission Granted     Permission granted to share info w AGENCY: [place]        Emotional Assessment Appearance:: Appears stated age Attitude/Demeanor/Rapport: Unable to Assess Affect (typically observed): Unable to Assess Orientation: : Oriented to Self, Oriented to Place, Oriented to  Time Alcohol / Substance Use: Not Applicable Psych Involvement: No (comment)  Admission diagnosis:  Acute ST elevation myocardial infarction (STEMI) of posterior wall (HCC) [I21.29] Patient Active Problem List   Diagnosis Date Noted  . Acute systolic heart failure ([place]) 11/16/2019  . Acute ST elevation myocardial infarction (  STEMI) of posterior wall ([place]) 11/12/2019  . Acute myocardial infarction ([place])   . Diverticulitis 11/02/2019  . Colostomy status ([place]) 03/16/2016  . Elevated  troponin 09/13/2015  . Hemoptysis   . Nausea and vomiting 09/09/2015  . Small bowel obstruction ([place]) 09/09/2015  . Hypertensive urgency 09/09/2015  . Type 2 diabetes mellitus with complication, without long-term current use of insulin ([place]) 09/09/2015  . Colonic mass   . Intestinal obstruction ([place])   . Preop cardiovascular exam 06/27/2014  . Left inguinal hernia 06/26/2014  . Dysphagia 09/28/2013    Class: Chronic  . Abnormal weight loss 09/28/2013    Class: Chronic  . HTN (hypertension)   . Coronary artery disease involving coronary bypass graft of native heart without angina pectoris   . Hyperlipidemia with target LDL less than 70   . GERD (gastroesophageal reflux disease)   . Nephrolithiasis   . Melanoma of skin, site unspecified   . Glaucoma    PCP:  Lavone Orn, MD Pharmacy:   [facility], [address] [place] [address] Phone: [phone] Fax: [phone]  [facility] [place], [place] [address] [place] [address] Phone: [phone] Fax: [phone]  [facility] [place] [place] - [address] [place] [address] Phone: [phone] Fax: [phone]     Social Determinants of Health (SDOH) Interventions    Readmission Risk Interventions No flowsheet data found.

## 2019-11-16 NOTE — Discharge Instructions (Signed)
Information about your medication: Brilinta (anti-platelet agent)  Generic Name (Brand): ticagrelor (Brilinta), twice daily medication  PURPOSE: You are taking this medication along with aspirin to lower your chance of having a heart attack, stroke, or blood clots in your heart stent. These can be fatal. Brilinta and aspirin help prevent platelets from sticking together and forming a clot that can block an artery or your stent.   Common SIDE EFFECTS you may experience include: bruising or bleeding more easily, shortness of breath  Do not stop taking BRILINTA without talking to the doctor who prescribes it for you. People who are treated with a stent and stop taking Brilinta too soon, have a higher risk of getting a blood clot in the stent, having a heart attack, or dying. If you stop Brilinta because of bleeding, or for other reasons, your risk of a heart attack or stroke may increase.   Tell all of your doctors and dentists that you are taking Brilinta. They should talk to the doctor who prescribed Brilinta for you before you have any surgery or invasive procedure.   If you have any pain or headache, we recommend you taking acetaminophen (Tylenol). NSAIDs, or anti-inflammatory agents, such as Ibuprofen and naproxen, can increase your risk for bleeding. Ask your pharmacist if you have any questions on what medications are okay to take while on brilinta.   Contact your health care provider if you experience: severe or uncontrollable bleeding, pink/red/brown urine, vomiting blood or vomit that looks like "coffee grounds", red or black stools (looks like tar), coughing up blood or blood clots ----------------------------------------------------------------------------------------------------------------------

## 2019-11-16 NOTE — Progress Notes (Signed)
CARDIAC REHAB PHASE I   PRE:  Rate/Rhythm: 102 SR with PACs  BP:  Sitting: 131/70      SaO2: 99 RA  MODE:  Ambulation: 160 ft   POST:  Rate/Rhythm: 114 SR with PACs  BP:  Sitting: 139/73    SaO2: 100 RA  Upon entering room, pts bed alarm going off. Pt trying to get up to use BR. Pt helped to BR, then ambulated 151ft in hallway with front wheel walker, assist of 2 for safety. Pt returned to recliner. Pt denies CP or SOB. Again, stressed importance of taking Brilinta with pt. Pt with cell phone, call bell, and bedside table within reach. Chair alarm on. Pt anxious for d/c.  [id number] Rufina Falco, RN BSN 11/16/2019 10:01 AM

## 2019-11-19 ENCOUNTER — Encounter (HOSPITAL_COMMUNITY)
Admission: EM | Disposition: A | Discharge status: Skilled Nursing Facility | Payer: Self-pay | Source: Home / Self Care | Attending: Neurology

## 2019-11-19 ENCOUNTER — Inpatient Hospital Stay (HOSPITAL_COMMUNITY): Payer: Medicare Other

## 2019-11-19 ENCOUNTER — Inpatient Hospital Stay (HOSPITAL_COMMUNITY): Payer: Medicare Other | Admitting: Registered Nurse

## 2019-11-19 ENCOUNTER — Emergency Department (HOSPITAL_COMMUNITY): Payer: Medicare Other

## 2019-11-19 ENCOUNTER — Telehealth (HOSPITAL_COMMUNITY): Payer: Self-pay

## 2019-11-19 ENCOUNTER — Other Ambulatory Visit: Payer: Self-pay

## 2019-11-19 ENCOUNTER — Inpatient Hospital Stay (HOSPITAL_COMMUNITY)
Admission: EM | Admit: 2019-11-19 | Discharge: 2019-11-24 | DRG: 023 | Disposition: A | Discharge status: Skilled Nursing Facility | Payer: Medicare Other | Attending: Neurology | Admitting: Neurology

## 2019-11-19 DIAGNOSIS — E876 Hypokalemia: Secondary | ICD-10-CM | POA: Diagnosis not present

## 2019-11-19 DIAGNOSIS — D500 Iron deficiency anemia secondary to blood loss (chronic): Secondary | ICD-10-CM | POA: Diagnosis present

## 2019-11-19 DIAGNOSIS — I429 Cardiomyopathy, unspecified: Secondary | ICD-10-CM | POA: Diagnosis present

## 2019-11-19 DIAGNOSIS — Z85820 Personal history of malignant melanoma of skin: Secondary | ICD-10-CM

## 2019-11-19 DIAGNOSIS — Z9181 History of falling: Secondary | ICD-10-CM

## 2019-11-19 DIAGNOSIS — G8194 Hemiplegia, unspecified affecting left nondominant side: Secondary | ICD-10-CM | POA: Diagnosis present

## 2019-11-19 DIAGNOSIS — Z951 Presence of aortocoronary bypass graft: Secondary | ICD-10-CM

## 2019-11-19 DIAGNOSIS — I672 Cerebral atherosclerosis: Secondary | ICD-10-CM | POA: Diagnosis present

## 2019-11-19 DIAGNOSIS — R064 Hyperventilation: Secondary | ICD-10-CM | POA: Diagnosis not present

## 2019-11-19 DIAGNOSIS — Z79899 Other long term (current) drug therapy: Secondary | ICD-10-CM

## 2019-11-19 DIAGNOSIS — E1136 Type 2 diabetes mellitus with diabetic cataract: Secondary | ICD-10-CM | POA: Diagnosis present

## 2019-11-19 DIAGNOSIS — Z20828 Contact with and (suspected) exposure to other viral communicable diseases: Secondary | ICD-10-CM | POA: Diagnosis present

## 2019-11-19 DIAGNOSIS — E1159 Type 2 diabetes mellitus with other circulatory complications: Secondary | ICD-10-CM

## 2019-11-19 DIAGNOSIS — Z79890 Hormone replacement therapy (postmenopausal): Secondary | ICD-10-CM

## 2019-11-19 DIAGNOSIS — I2111 ST elevation (STEMI) myocardial infarction involving right coronary artery: Secondary | ICD-10-CM | POA: Diagnosis present

## 2019-11-19 DIAGNOSIS — I5022 Chronic systolic (congestive) heart failure: Secondary | ICD-10-CM | POA: Diagnosis present

## 2019-11-19 DIAGNOSIS — I5021 Acute systolic (congestive) heart failure: Secondary | ICD-10-CM | POA: Diagnosis present

## 2019-11-19 DIAGNOSIS — Z880 Allergy status to penicillin: Secondary | ICD-10-CM

## 2019-11-19 DIAGNOSIS — I2510 Atherosclerotic heart disease of native coronary artery without angina pectoris: Secondary | ICD-10-CM | POA: Diagnosis present

## 2019-11-19 DIAGNOSIS — I110 Hypertensive heart disease with heart failure: Secondary | ICD-10-CM | POA: Diagnosis present

## 2019-11-19 DIAGNOSIS — E7800 Pure hypercholesterolemia, unspecified: Secondary | ICD-10-CM | POA: Diagnosis present

## 2019-11-19 DIAGNOSIS — K219 Gastro-esophageal reflux disease without esophagitis: Secondary | ICD-10-CM | POA: Diagnosis present

## 2019-11-19 DIAGNOSIS — I493 Ventricular premature depolarization: Secondary | ICD-10-CM | POA: Diagnosis present

## 2019-11-19 DIAGNOSIS — I5042 Chronic combined systolic (congestive) and diastolic (congestive) heart failure: Secondary | ICD-10-CM | POA: Diagnosis not present

## 2019-11-19 DIAGNOSIS — R54 Age-related physical debility: Secondary | ICD-10-CM | POA: Diagnosis present

## 2019-11-19 DIAGNOSIS — R29716 NIHSS score 16: Secondary | ICD-10-CM | POA: Diagnosis present

## 2019-11-19 DIAGNOSIS — R778 Other specified abnormalities of plasma proteins: Secondary | ICD-10-CM | POA: Diagnosis present

## 2019-11-19 DIAGNOSIS — I959 Hypotension, unspecified: Secondary | ICD-10-CM | POA: Diagnosis present

## 2019-11-19 DIAGNOSIS — I63431 Cerebral infarction due to embolism of right posterior cerebral artery: Principal | ICD-10-CM | POA: Diagnosis present

## 2019-11-19 DIAGNOSIS — E785 Hyperlipidemia, unspecified: Secondary | ICD-10-CM | POA: Diagnosis present

## 2019-11-19 DIAGNOSIS — I639 Cerebral infarction, unspecified: Secondary | ICD-10-CM | POA: Diagnosis present

## 2019-11-19 DIAGNOSIS — I5023 Acute on chronic systolic (congestive) heart failure: Secondary | ICD-10-CM

## 2019-11-19 DIAGNOSIS — I6521 Occlusion and stenosis of right carotid artery: Secondary | ICD-10-CM | POA: Diagnosis present

## 2019-11-19 DIAGNOSIS — I6340 Cerebral infarction due to embolism of unspecified cerebral artery: Secondary | ICD-10-CM | POA: Diagnosis not present

## 2019-11-19 DIAGNOSIS — I340 Nonrheumatic mitral (valve) insufficiency: Secondary | ICD-10-CM

## 2019-11-19 DIAGNOSIS — H5340 Unspecified visual field defects: Secondary | ICD-10-CM | POA: Diagnosis present

## 2019-11-19 DIAGNOSIS — Z7984 Long term (current) use of oral hypoglycemic drugs: Secondary | ICD-10-CM

## 2019-11-19 DIAGNOSIS — I651 Occlusion and stenosis of basilar artery: Secondary | ICD-10-CM

## 2019-11-19 DIAGNOSIS — Z87891 Personal history of nicotine dependence: Secondary | ICD-10-CM

## 2019-11-19 DIAGNOSIS — E039 Hypothyroidism, unspecified: Secondary | ICD-10-CM | POA: Diagnosis present

## 2019-11-19 DIAGNOSIS — E119 Type 2 diabetes mellitus without complications: Secondary | ICD-10-CM

## 2019-11-19 DIAGNOSIS — Z7289 Other problems related to lifestyle: Secondary | ICD-10-CM

## 2019-11-19 DIAGNOSIS — Z9049 Acquired absence of other specified parts of digestive tract: Secondary | ICD-10-CM

## 2019-11-19 DIAGNOSIS — Z955 Presence of coronary angioplasty implant and graft: Secondary | ICD-10-CM

## 2019-11-19 DIAGNOSIS — I509 Heart failure, unspecified: Secondary | ICD-10-CM

## 2019-11-19 DIAGNOSIS — R7989 Other specified abnormal findings of blood chemistry: Secondary | ICD-10-CM | POA: Diagnosis present

## 2019-11-19 DIAGNOSIS — Z886 Allergy status to analgesic agent status: Secondary | ICD-10-CM

## 2019-11-19 DIAGNOSIS — F419 Anxiety disorder, unspecified: Secondary | ICD-10-CM | POA: Diagnosis present

## 2019-11-19 DIAGNOSIS — R414 Neurologic neglect syndrome: Secondary | ICD-10-CM | POA: Diagnosis present

## 2019-11-19 DIAGNOSIS — Z8249 Family history of ischemic heart disease and other diseases of the circulatory system: Secondary | ICD-10-CM

## 2019-11-19 DIAGNOSIS — R195 Other fecal abnormalities: Secondary | ICD-10-CM | POA: Diagnosis not present

## 2019-11-19 DIAGNOSIS — Z8349 Family history of other endocrine, nutritional and metabolic diseases: Secondary | ICD-10-CM

## 2019-11-19 DIAGNOSIS — E1151 Type 2 diabetes mellitus with diabetic peripheral angiopathy without gangrene: Secondary | ICD-10-CM | POA: Diagnosis present

## 2019-11-19 DIAGNOSIS — Z7902 Long term (current) use of antithrombotics/antiplatelets: Secondary | ICD-10-CM

## 2019-11-19 DIAGNOSIS — Z888 Allergy status to other drugs, medicaments and biological substances status: Secondary | ICD-10-CM

## 2019-11-19 DIAGNOSIS — Z8719 Personal history of other diseases of the digestive system: Secondary | ICD-10-CM

## 2019-11-19 DIAGNOSIS — H409 Unspecified glaucoma: Secondary | ICD-10-CM | POA: Diagnosis present

## 2019-11-19 HISTORY — PX: IR PERCUTANEOUS ART THROMBECTOMY/INFUSION INTRACRANIAL INC DIAG ANGIO: IMG6087

## 2019-11-19 HISTORY — PX: RADIOLOGY WITH ANESTHESIA: SHX6223

## 2019-11-19 HISTORY — PX: IR CT HEAD LTD: IMG2386

## 2019-11-19 LAB — I-STAT CHEM 8, ED
BUN: 16 mg/dL (ref 8–23)
Calcium, Ion: 1.14 mmol/L — ABNORMAL LOW (ref 1.15–1.40)
Chloride: 107 mmol/L (ref 98–111)
Creatinine, Ser: 0.8 mg/dL (ref 0.61–1.24)
Glucose, Bld: 168 mg/dL — ABNORMAL HIGH (ref 70–99)
HCT: 33 % — ABNORMAL LOW (ref 39.0–52.0)
Hemoglobin: 11.2 g/dL — ABNORMAL LOW (ref 13.0–17.0)
Potassium: 4.2 mmol/L (ref 3.5–5.1)
Sodium: 141 mmol/L (ref 135–145)
TCO2: 24 mmol/L (ref 22–32)

## 2019-11-19 LAB — RESPIRATORY PANEL BY RT PCR (FLU A&B, COVID)
Influenza A by PCR: NEGATIVE
Influenza B by PCR: NEGATIVE
SARS Coronavirus 2 by RT PCR: NEGATIVE

## 2019-11-19 LAB — URINALYSIS, ROUTINE W REFLEX MICROSCOPIC
Bilirubin Urine: NEGATIVE
Glucose, UA: NEGATIVE mg/dL
Hgb urine dipstick: NEGATIVE
Ketones, ur: 20 mg/dL — AB
Leukocytes,Ua: NEGATIVE
Nitrite: NEGATIVE
Protein, ur: NEGATIVE mg/dL
Specific Gravity, Urine: >1.046 — ABNORMAL HIGH (ref 1.005–1.030)
pH: 5 (ref 5.0–8.0)

## 2019-11-19 LAB — GLUCOSE, CAPILLARY
Glucose-Capillary: 154 mg/dL — ABNORMAL HIGH (ref 70–99)
Glucose-Capillary: 122 mg/dL — ABNORMAL HIGH (ref 70–99)
Glucose-Capillary: 206 mg/dL — ABNORMAL HIGH (ref 70–99)
Glucose-Capillary: 103 mg/dL — ABNORMAL HIGH (ref 70–99)

## 2019-11-19 LAB — CBC
HCT: 33.8 % — ABNORMAL LOW (ref 39.0–52.0)
Hemoglobin: 10.4 g/dL — ABNORMAL LOW (ref 13.0–17.0)
MCH: 31.1 pg (ref 26.0–34.0)
MCHC: 30.8 g/dL (ref 30.0–36.0)
MCV: 101.2 fL — ABNORMAL HIGH (ref 80.0–100.0)
Platelets: 679 10*3/uL — ABNORMAL HIGH (ref 150–400)
RBC: 3.34 MIL/uL — ABNORMAL LOW (ref 4.22–5.81)
RDW: 13.8 % (ref 11.5–15.5)
WBC: 8.2 10*3/uL (ref 4.0–10.5)
nRBC: 0 % (ref 0.0–0.2)

## 2019-11-19 LAB — COMPREHENSIVE METABOLIC PANEL
ALT: 14 U/L (ref 0–44)
AST: 22 U/L (ref 15–41)
Albumin: 2.7 g/dL — ABNORMAL LOW (ref 3.5–5.0)
Alkaline Phosphatase: 66 U/L (ref 38–126)
Anion gap: 13 (ref 5–15)
BUN: 13 mg/dL (ref 8–23)
CO2: 20 mmol/L — ABNORMAL LOW (ref 22–32)
Calcium: 8.8 mg/dL — ABNORMAL LOW (ref 8.9–10.3)
Chloride: 108 mmol/L (ref 98–111)
Creatinine, Ser: 0.96 mg/dL (ref 0.61–1.24)
GFR calc Af Amer: >60 mL/min (ref >60)
GFR calc non Af Amer: >60 mL/min (ref >60)
Glucose, Bld: 177 mg/dL — ABNORMAL HIGH (ref 70–99)
Potassium: 4.1 mmol/L (ref 3.5–5.1)
Sodium: 141 mmol/L (ref 135–145)
Total Bilirubin: 0.4 mg/dL (ref 0.3–1.2)
Total Protein: 6.5 g/dL (ref 6.5–8.1)

## 2019-11-19 LAB — DIFFERENTIAL
Abs Immature Granulocytes: 0.05 10*3/uL (ref 0.00–0.07)
Basophils Absolute: 0.1 10*3/uL (ref 0.0–0.1)
Basophils Relative: 1 %
Eosinophils Absolute: 0.2 10*3/uL (ref 0.0–0.5)
Eosinophils Relative: 2 %
Immature Granulocytes: 1 %
Lymphocytes Relative: 12 %
Lymphs Abs: 0.9 10*3/uL (ref 0.7–4.0)
Monocytes Absolute: 0.6 10*3/uL (ref 0.1–1.0)
Monocytes Relative: 8 %
Neutro Abs: 6.3 10*3/uL (ref 1.7–7.7)
Neutrophils Relative %: 76 %

## 2019-11-19 LAB — RAPID URINE DRUG SCREEN, HOSP PERFORMED
Amphetamines: NOT DETECTED
Barbiturates: NOT DETECTED
Benzodiazepines: NOT DETECTED
Cocaine: NOT DETECTED
Opiates: NOT DETECTED
Tetrahydrocannabinol: NOT DETECTED

## 2019-11-19 LAB — PROTIME-INR
INR: 1 (ref 0.8–1.2)
Prothrombin Time: 13.2 seconds (ref 11.4–15.2)

## 2019-11-19 LAB — MRSA PCR SCREENING: MRSA by PCR: NEGATIVE

## 2019-11-19 LAB — CBG MONITORING, ED: Glucose-Capillary: 153 mg/dL — ABNORMAL HIGH (ref 70–99)

## 2019-11-19 LAB — ETHANOL: Alcohol, Ethyl (B): <10 mg/dL (ref <10)

## 2019-11-19 LAB — APTT: aPTT: 30 seconds (ref 24–36)

## 2019-11-19 LAB — ECHOCARDIOGRAM COMPLETE

## 2019-11-19 SURGERY — RADIOLOGY WITH ANESTHESIA
Anesthesia: General

## 2019-11-19 MED ORDER — CLEVIDIPINE BUTYRATE 0.5 MG/ML IV EMUL: 0.0000 mg/h | INTRAVENOUS | Status: DC

## 2019-11-19 MED ORDER — INSULIN ASPART 100 UNIT/ML ~~LOC~~ SOLN
0.0000 [IU] | SUBCUTANEOUS | Status: DC
  Administered 2019-11-19: 13:00:00 3 [IU] via SUBCUTANEOUS
  Administered 2019-11-19: 5 [IU] via SUBCUTANEOUS
  Administered 2019-11-19 – 2019-11-20 (×2): 2 [IU] via SUBCUTANEOUS
  Administered 2019-11-20: 3 [IU] via SUBCUTANEOUS
  Administered 2019-11-20: 2 [IU] via SUBCUTANEOUS
  Administered 2019-11-20: 11 [IU] via SUBCUTANEOUS
  Administered 2019-11-21: 2 [IU] via SUBCUTANEOUS
  Administered 2019-11-21: 3 [IU] via SUBCUTANEOUS

## 2019-11-19 MED ORDER — EPTIFIBATIDE 20 MG/10ML IV SOLN
INTRAVENOUS | Status: AC
  Filled 2019-11-19: qty 10

## 2019-11-19 MED ORDER — TIROFIBAN HCL IN NACL 5-0.9 MG/100ML-% IV SOLN
INTRAVENOUS | Status: AC
  Filled 2019-11-19: qty 100

## 2019-11-19 MED ORDER — ACETAMINOPHEN 160 MG/5ML PO SOLN: 650.0000 mg | ORAL | Status: DC | PRN

## 2019-11-19 MED ORDER — NITROGLYCERIN 1 MG/10 ML FOR IR/CATH LAB
INTRA_ARTERIAL | Status: AC
  Filled 2019-11-19: qty 10

## 2019-11-19 MED ORDER — ACETAMINOPHEN 650 MG RE SUPP: 650.0000 mg | RECTAL | Status: DC | PRN

## 2019-11-19 MED ORDER — TICAGRELOR 90 MG PO TABS
ORAL_TABLET | ORAL | Status: AC
  Filled 2019-11-19: qty 2

## 2019-11-19 MED ORDER — SODIUM CHLORIDE 0.9 % IV SOLN
INTRAVENOUS | Status: DC
  Administered 2019-11-19 (×2): via INTRAVENOUS

## 2019-11-19 MED ORDER — SUGAMMADEX SODIUM 200 MG/2ML IV SOLN
INTRAVENOUS | Status: DC | PRN
  Administered 2019-11-19: 200 mg via INTRAVENOUS

## 2019-11-19 MED ORDER — SUCCINYLCHOLINE CHLORIDE 200 MG/10ML IV SOSY
PREFILLED_SYRINGE | INTRAVENOUS | Status: DC | PRN
  Administered 2019-11-19: 160 mg via INTRAVENOUS

## 2019-11-19 MED ORDER — SODIUM CHLORIDE 0.9 % IV BOLUS
250.0000 mL | Freq: Once | INTRAVENOUS | Status: AC
  Administered 2019-11-19: 20:00:00 250 mL via INTRAVENOUS

## 2019-11-19 MED ORDER — TICAGRELOR 90 MG PO TABS: 90.0000 mg | ORAL_TABLET | Freq: Two times a day (BID) | ORAL | Status: DC

## 2019-11-19 MED ORDER — TICAGRELOR 90 MG PO TABS
90.0000 mg | ORAL_TABLET | Freq: Two times a day (BID) | ORAL | Status: DC
  Administered 2019-11-19 – 2019-11-20 (×2): 90 mg via ORAL
  Filled 2019-11-19 (×2): qty 1

## 2019-11-19 MED ORDER — ROCURONIUM BROMIDE 50 MG/5ML IV SOSY
PREFILLED_SYRINGE | INTRAVENOUS | Status: DC | PRN
  Administered 2019-11-19: 10 mg via INTRAVENOUS
  Administered 2019-11-19: 30 mg via INTRAVENOUS

## 2019-11-19 MED ORDER — LATANOPROST 0.005 % OP SOLN
1.0000 [drp] | Freq: Every day | OPHTHALMIC | Status: DC
  Administered 2019-11-20 – 2019-11-23 (×4): 1 [drp] via OPHTHALMIC
  Filled 2019-11-19 (×3): qty 2.5

## 2019-11-19 MED ORDER — ONDANSETRON HCL 4 MG/2ML IJ SOLN
INTRAMUSCULAR | Status: DC | PRN
  Administered 2019-11-19: 4 mg via INTRAVENOUS

## 2019-11-19 MED ORDER — ETOMIDATE 2 MG/ML IV SOLN
INTRAVENOUS | Status: DC | PRN
  Administered 2019-11-19: 10 mg via INTRAVENOUS

## 2019-11-19 MED ORDER — VANCOMYCIN HCL 1000 MG IV SOLR
INTRAVENOUS | Status: DC | PRN
  Administered 2019-11-19: 1000 mg via INTRAVENOUS

## 2019-11-19 MED ORDER — CLEVIDIPINE BUTYRATE 0.5 MG/ML IV EMUL
INTRAVENOUS | Status: DC | PRN
  Administered 2019-11-19: 2 mg/h via INTRAVENOUS

## 2019-11-19 MED ORDER — ACETAMINOPHEN 325 MG PO TABS
650.0000 mg | ORAL_TABLET | ORAL | Status: DC | PRN
  Administered 2019-11-19 – 2019-11-24 (×4): 650 mg via ORAL
  Filled 2019-11-19 (×4): qty 2

## 2019-11-19 MED ORDER — PHENYLEPHRINE 40 MCG/ML (10ML) SYRINGE FOR IV PUSH (FOR BLOOD PRESSURE SUPPORT)
PREFILLED_SYRINGE | INTRAVENOUS | Status: DC | PRN
  Administered 2019-11-19: 40 ug via INTRAVENOUS

## 2019-11-19 MED ORDER — TICAGRELOR 60 MG PO TABS
ORAL_TABLET | ORAL | Status: AC | PRN
  Administered 2019-11-19: 90 mg

## 2019-11-19 MED ORDER — ASPIRIN 300 MG RE SUPP
300.0000 mg | Freq: Every day | RECTAL | Status: DC
  Administered 2019-11-19: 12:00:00 300 mg via RECTAL
  Filled 2019-11-19: qty 1

## 2019-11-19 MED ORDER — SENNOSIDES-DOCUSATE SODIUM 8.6-50 MG PO TABS
1.0000 | ORAL_TABLET | Freq: Once | ORAL | Status: DC
  Filled 2019-11-19: qty 1

## 2019-11-19 MED ORDER — ACETAMINOPHEN 325 MG PO TABS: 650.0000 mg | ORAL_TABLET | ORAL | Status: DC | PRN

## 2019-11-19 MED ORDER — IOHEXOL 300 MG/ML  SOLN
150.0000 mL | Freq: Once | INTRAMUSCULAR | Status: AC | PRN
  Administered 2019-11-19: 07:00:00 50 mL via INTRA_ARTERIAL

## 2019-11-19 MED ORDER — CLOPIDOGREL BISULFATE 300 MG PO TABS
ORAL_TABLET | ORAL | Status: AC
  Filled 2019-11-19: qty 1

## 2019-11-19 MED ORDER — LIDOCAINE 2% (20 MG/ML) 5 ML SYRINGE
INTRAMUSCULAR | Status: DC | PRN
  Administered 2019-11-19: 60 mg via INTRAVENOUS

## 2019-11-19 MED ORDER — LEVOTHYROXINE SODIUM 50 MCG PO TABS
50.0000 ug | ORAL_TABLET | Freq: Every day | ORAL | Status: DC
  Administered 2019-11-20 – 2019-11-24 (×5): 50 ug via ORAL
  Filled 2019-11-19 (×5): qty 1

## 2019-11-19 MED ORDER — PHENYLEPHRINE HCL-NACL 10-0.9 MG/250ML-% IV SOLN
INTRAVENOUS | Status: DC | PRN
  Administered 2019-11-19: 60 ug/min via INTRAVENOUS

## 2019-11-19 MED ORDER — IOHEXOL 300 MG/ML  SOLN
150.0000 mL | Freq: Once | INTRAMUSCULAR | Status: AC | PRN
  Administered 2019-11-19: 25 mL via INTRA_ARTERIAL

## 2019-11-19 MED ORDER — SODIUM CHLORIDE 0.9 % IV SOLN
INTRAVENOUS | Status: DC
  Administered 2019-11-19: 09:00:00 via INTRAVENOUS

## 2019-11-19 MED ORDER — DEXAMETHASONE SODIUM PHOSPHATE 10 MG/ML IJ SOLN
INTRAMUSCULAR | Status: DC | PRN
  Administered 2019-11-19: 8 mg via INTRAVENOUS

## 2019-11-19 MED ORDER — CHLORHEXIDINE GLUCONATE CLOTH 2 % EX PADS
6.0000 | MEDICATED_PAD | Freq: Every day | CUTANEOUS | Status: DC
  Administered 2019-11-19 – 2019-11-24 (×5): 6 via TOPICAL

## 2019-11-19 MED ORDER — CLEVIDIPINE BUTYRATE 0.5 MG/ML IV EMUL
INTRAVENOUS | Status: AC
  Filled 2019-11-19: qty 50

## 2019-11-19 MED ORDER — ASPIRIN 325 MG PO TABS: 325.0000 mg | ORAL_TABLET | Freq: Every day | ORAL | Status: DC

## 2019-11-19 MED ORDER — PROPOFOL 10 MG/ML IV BOLUS: INTRAVENOUS | Status: DC | PRN

## 2019-11-19 MED ORDER — STROKE: EARLY STAGES OF RECOVERY BOOK
Freq: Once | Status: DC
  Filled 2019-11-19: qty 1

## 2019-11-19 MED ORDER — ASPIRIN EC 81 MG PO TBEC
81.0000 mg | DELAYED_RELEASE_TABLET | Freq: Every day | ORAL | Status: DC
  Administered 2019-11-20: 81 mg via ORAL
  Filled 2019-11-19: qty 1

## 2019-11-19 MED ORDER — IOHEXOL 350 MG/ML SOLN
115.0000 mL | Freq: Once | INTRAVENOUS | Status: AC | PRN
  Administered 2019-11-19: 05:00:00 115 mL via INTRAVENOUS

## 2019-11-19 MED ORDER — ROSUVASTATIN CALCIUM 20 MG PO TABS
20.0000 mg | ORAL_TABLET | Freq: Every day | ORAL | Status: DC
  Administered 2019-11-19 – 2019-11-23 (×5): 20 mg via ORAL
  Filled 2019-11-19 (×5): qty 1

## 2019-11-19 MED ORDER — VANCOMYCIN HCL IN DEXTROSE 1-5 GM/200ML-% IV SOLN
INTRAVENOUS | Status: AC
  Filled 2019-11-19: qty 200

## 2019-11-19 MED ORDER — PERFLUTREN LIPID MICROSPHERE
1.0000 mL | INTRAVENOUS | Status: AC | PRN
  Administered 2019-11-19: 09:00:00 2 mL via INTRAVENOUS
  Filled 2019-11-19: qty 10

## 2019-11-19 MED ORDER — ISOSORBIDE MONONITRATE ER 60 MG PO TB24
60.0000 mg | ORAL_TABLET | Freq: Every evening | ORAL | Status: DC
  Administered 2019-11-19 – 2019-11-23 (×5): 60 mg via ORAL
  Filled 2019-11-19 (×5): qty 1

## 2019-11-19 NOTE — ED Notes (Signed)
Pt left for IR at 0530.

## 2019-11-19 NOTE — Anesthesia Procedure Notes (Signed)
Procedure Name: Intubation Date/Time: 11/19/2019 6:13 AM Performed by: Orlie Dakin, CRNA Pre-anesthesia Checklist: Patient identified, Emergency Drugs available, Suction available and Patient being monitored Patient Re-evaluated:Patient Re-evaluated prior to induction Oxygen Delivery Method: Circle system utilized Preoxygenation: Pre-oxygenation with 100% oxygen Induction Type: IV induction and Rapid sequence Laryngoscope Size: Glidescope and 3 Grade View: Grade I Tube type: Oral Tube size: 7.5 mm Number of attempts: 1 Airway Equipment and Method: Stylet and Video-laryngoscopy Placement Confirmation: ETT inserted through vocal cords under direct vision,  positive ETCO2 and breath sounds checked- equal and bilateral Secured at: 23 cm Tube secured with: Tape Dental Injury: Teeth and Oropharynx as per pre-operative assessment  Comments: Disp Glidescope used.

## 2019-11-19 NOTE — Procedures (Signed)
S/P Lt VA angiogram followed by complete revascularization of basilar artery with x 1 pass with 34mm x 40 mm solitaire X retriever device and penumbra aspiration achieving a TICI 3 revascularization.  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 S.Reygan Heagle MD

## 2019-11-19 NOTE — Progress Notes (Signed)
Pt from IR, R sheath pulled at 0728. Dressing c/d/i, Level 0.  Daughter Daleen Snook called and updated on procedure and plan. Belongings at bedside 3 rings and 1 watch in pink denture cup.

## 2019-11-19 NOTE — Progress Notes (Signed)
Patient ID: Kenneth [name], male   DOB: 1928/03/02, 85 y.o.   MRN: [id number] Post procedure CT brain NO ICH or mass effect. RT groin sheath removed with manual compression for hemostasis. Distal pulses Dopplerable RT DP and PT. Dopplerable LT DP and PT ?inconsistently.unchanged  from prior to the procedure. Patient extubated. Maintaining O2 sats. Obeys simple commands appropriately. Pupils 2 mm Rt = LT Tongue midline. Moves all 4s spontaneously and to command. RT groin soft. S.Damyra Luscher MD

## 2019-11-19 NOTE — Telephone Encounter (Signed)
Recve'd referral for CR, pt was admitted to [facility] 11/19/2019 for a stroke. Will hold off on checking pt insurance and will place pt ppw in medical hold bin.

## 2019-11-19 NOTE — Progress Notes (Signed)
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 Estanislado Pandy. Patient awake and alert laying in bed with no complaints at this time. Can spontaneously move all extremities. Speech clear. Right groin incision c/d/i.   Allergies: Lasix [furosemide], Spironolactone, Aspirin, Lipitor [atorvastatin], Lopressor [metoprolol tartrate], and Penicillins  Medications: Prior to Admission medications   Medication Sig Start Date End Date Taking? Authorizing Provider  acetaminophen (TYLENOL) 325 MG tablet Take 325 mg by mouth every 6 (six) hours as needed for mild pain.     [provider]  Artificial Tear Solution (SOOTHE XP OP) Apply 1 drop to eye as needed (dry eyes).     [provider]  Cyanocobalamin (VITAMIN B-12 PO) Take 1 tablet by mouth daily.    [provider]  ferrous sulfate 325 (65 FE) MG tablet Take 325 mg by mouth daily with breakfast.    [provider]  isosorbide mononitrate (IMDUR) 60 MG 24 hr tablet Take 60 mg by mouth every evening.     [provider]  latanoprost (XALATAN) 0.005 % ophthalmic solution Place 1 drop into both eyes at bedtime.    [provider]  levothyroxine (SYNTHROID, LEVOTHROID) 75 MCG tablet Take 50 mcg by mouth daily before breakfast.     [provider]  losartan (COZAAR) 25 MG tablet Take 0.5 tablets (12.5 mg total) by mouth daily. 11/16/19   Cheryln Manly, NP  metFORMIN (GLUCOPHAGE) 1000 MG tablet Take 500 mg by mouth daily with breakfast.     [provider]  metoprolol succinate (TOPROL-XL) 50 MG 24 hr tablet TAKE ONE-HALF (1/2) TABLET BY MOUTH DAILY WITH OR AFTER A MEAL Patient taking differently: Take 25 mg by  mouth daily. WITH OR AFTER A MEAL 10/29/19   Belva Crome, MD  Multiple Vitamin (MULTIVITAMIN WITH MINERALS) TABS tablet Take 1 tablet by mouth daily.    [provider]  nitroGLYCERIN (NITROSTAT) 0.4 MG SL tablet Place 1 tablet (0.4 mg total) under the tongue every 5 (five) minutes as needed. 11/16/19   Cheryln Manly, NP  ondansetron (ZOFRAN) 4 MG tablet Take 1 tablet (4 mg total) by mouth every 6 (six) hours as needed for nausea. 11/04/19   Alma Friendly, MD  pantoprazole (PROTONIX) 40 MG tablet Take 1 tablet (40 mg total) by mouth 2 (two) times daily for 30 days, THEN 1 tablet (40 mg total) daily. 11/16/19 01/15/20  Cheryln Manly, NP  potassium chloride (K-DUR,KLOR-CON) 10 MEQ tablet Take 10 mEq by mouth every Monday, Wednesday, and Friday.  06/27/18   [provider]  rosuvastatin (CRESTOR) 20 MG tablet Take 1 tablet (20 mg total) by mouth daily at 6 PM. 11/16/19   Cheryln Manly, NP  ticagrelor (BRILINTA) 90 MG TABS tablet Take 1 tablet (90 mg total) by mouth 2 (two) times daily. 11/16/19   Cheryln Manly, NP     Vital Signs: BP 101/62   Pulse 100   Temp 98.5 F (36.9 C) (Oral)   Resp (!) 31   Ht 5\' 10"  (1.778 m)   Wt 149 lb 14.6 oz (68 kg)   SpO2 98%   BMI 21.51 kg/m   Physical Exam Vitals and nursing note reviewed.  Constitutional:      General: He  is not in acute distress.    Appearance: Normal appearance.  Pulmonary:     Effort: Pulmonary effort is normal. No respiratory distress.  Skin:    General: Skin is warm and dry.     Comments: Right groin incision soft without active bleeding or hematoma.  Neurological:     Mental Status: He is alert.     Comments: Alert, awake, and oriented x3. Speech and comprehension intact. PERRL bilaterally. Can spontaneously move all extremities. No pronator drift. Distal pulses palpable bilaterally with Doppler.  Psychiatric:        Mood and Affect: Mood normal.        Behavior: Behavior  normal.     Imaging: CT Code Stroke CTA Head W/WO contrast  Result Date: 11/19/2019 CLINICAL DATA:  Right MCA syndrome EXAM: CT ANGIOGRAPHY HEAD AND NECK CT PERFUSION BRAIN TECHNIQUE: Multidetector CT imaging of the head and neck was performed using the standard protocol during bolus administration of intravenous contrast. Multiplanar CT image reconstructions and MIPs were obtained to evaluate the vascular anatomy. Carotid stenosis measurements (when applicable) are obtained utilizing NASCET criteria, using the distal internal carotid diameter as the denominator. Multiphase CT imaging of the brain was performed following IV bolus contrast injection. Subsequent parametric perfusion maps were calculated using RAPID software. CONTRAST:  164mL OMNIPAQUE IOHEXOL 350 MG/ML SOLN COMPARISON:  Head CT from earlier today FINDINGS: CTA NECK FINDINGS Aortic arch: Atherosclerotic plaque with 3 vessel branching. 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 The LIMA and upper venous graft are patent. Right carotid system: Bulky calcified plaque at the bifurcation without flow limiting stenosis or ulceration. No downstream beading or dissection. Left carotid system: Remarkably bulky calcified plaque at the bifurcation. ICA stenosis measures 75% on coronal reformats. No ulceration or dissection. Vertebral arteries: Proximal subclavian atherosclerosis without flow limiting stenosis. Atherosclerotic plaque on the dominant left V1 segment and V2 segment without flow limiting stenosis or beading. Faint flow in the non dominant right vertebral artery. Skeleton: Disc and facet degeneration. No acute or aggressive finding Other neck: Frothy secretions in the trachea. No acute or aggressive finding. Upper chest: Calcified granulomas in the apical lungs. Review of the MIP images confirms the above findings CTA HEAD FINDINGS Anterior circulation: Atherosclerotic plaque along both carotid siphons which is confluent. There is a severe stenosis at the  supraclinoid right ICA segment with small vessel size limiting accurate measurement. No downstream branch occlusion although there is less well filled right MCA branches. The paraclinoid left ICA is moderately stenotic and likely overestimated visually due to degree of calcified plaque blooming. Stenosis is likely 50% or less. No downstream branch occlusion or beading. Atheromatous irregularity of medium size vessels. Posterior circulation: Strong left vertebral artery dominance. There is faint thready flow in the right vertebral artery with no meaningful contribution to the basilar. There is a high-grade short segment narrowing at the left vertebrobasilar junction. Abrupt loss of flow within the distal basilar with no flow seen in the P1 segments and throughout the majority of the right PCA territory. There is a moderate-sized left posterior communicating artery. Less intense enhancement is seen within the vertebrobasilar arteries compared to the carotids. Distal basilar occlusion is new from flow voids in 2014 brain MRI Venous sinuses: Unremarkable in the arterial phase Anatomic variants: As above Review of the MIP images confirms the above findings CT Brain Perfusion Findings: ASPECTS: 10 CBF (<30%) Volume: 2mL Perfusion (Tmax>6.0s) volume: 55mL Ischemia is seen in the right PCA territory These results were called by telephone  at the time of interpretation on 11/19/2019 at 5:18 am to provider [facility] , who verbally acknowledged these results. IMPRESSION: 1. 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 2. Severe right supraclinoid ICA stenosis with right MCA branch underfilling, chronic appearing 3. High-grade narrowing at the left vertebrobasilar junction. No meaningful contribution to the basilar by the non dominant right vertebral artery. 4. Heavily calcified carotid bifurcations, especially on the left where ICA stenosis measures  75%. Electronically Signed   By: Monte Fantasia M.D.   On: 11/19/2019 05:24   CT Code Stroke CTA Neck W/WO contrast  Result Date: 11/19/2019 CLINICAL DATA:  Right MCA syndrome EXAM: CT ANGIOGRAPHY HEAD AND NECK CT PERFUSION BRAIN TECHNIQUE: Multidetector CT imaging of the head and neck was performed using the standard protocol during bolus administration of intravenous contrast. Multiplanar CT image reconstructions and MIPs were obtained to evaluate the vascular anatomy. Carotid stenosis measurements (when applicable) are obtained utilizing NASCET criteria, using the distal internal carotid diameter as the denominator. Multiphase CT imaging of the brain was performed following IV bolus contrast injection. Subsequent parametric perfusion maps were calculated using RAPID software. CONTRAST:  162mL OMNIPAQUE IOHEXOL 350 MG/ML SOLN COMPARISON:  Head CT from earlier today FINDINGS: CTA NECK FINDINGS Aortic arch: Atherosclerotic plaque with 3 vessel branching. 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 The LIMA and upper venous graft are patent. Right carotid system: Bulky calcified plaque at the bifurcation without flow limiting stenosis or ulceration. No downstream beading or dissection. Left carotid system: Remarkably bulky calcified plaque at the bifurcation. ICA stenosis measures 75% on coronal reformats. No ulceration or dissection. Vertebral arteries: Proximal subclavian atherosclerosis without flow limiting stenosis. Atherosclerotic plaque on the dominant left V1 segment and V2 segment without flow limiting stenosis or beading. Faint flow in the non dominant right vertebral artery. Skeleton: Disc and facet degeneration. No acute or aggressive finding Other neck: Frothy secretions in the trachea. No acute or aggressive finding. Upper chest: Calcified granulomas in the apical lungs. Review of the MIP images confirms the above findings CTA HEAD FINDINGS Anterior circulation: Atherosclerotic plaque along both carotid siphons which is  confluent. There is a severe stenosis at the supraclinoid right ICA segment with small vessel size limiting accurate measurement. No downstream branch occlusion although there is less well filled right MCA branches. The paraclinoid left ICA is moderately stenotic and likely overestimated visually due to degree of calcified plaque blooming. Stenosis is likely 50% or less. No downstream branch occlusion or beading. Atheromatous irregularity of medium size vessels. Posterior circulation: Strong left vertebral artery dominance. There is faint thready flow in the right vertebral artery with no meaningful contribution to the basilar. There is a high-grade short segment narrowing at the left vertebrobasilar junction. Abrupt loss of flow within the distal basilar with no flow seen in the P1 segments and throughout the majority of the right PCA territory. There is a moderate-sized left posterior communicating artery. Less intense enhancement is seen within the vertebrobasilar arteries compared to the carotids. Distal basilar occlusion is new from flow voids in 2014 brain MRI Venous sinuses: Unremarkable in the arterial phase Anatomic variants: As above Review of the MIP images confirms the above findings CT Brain Perfusion Findings: ASPECTS: 10 CBF (<30%) Volume: 62mL Perfusion (Tmax>6.0s) volume: 29mL Ischemia is seen in the right PCA territory These results were called by telephone at the time of interpretation on 11/19/2019 at 5:18 am to provider MCNEILL [facility] ,  who verbally acknowledged these results. IMPRESSION: 1. 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 2. Severe right supraclinoid ICA stenosis with right MCA branch underfilling, chronic appearing 3. High-grade narrowing at the left vertebrobasilar junction. No meaningful contribution to the basilar by the non dominant right vertebral artery. 4. Heavily calcified carotid bifurcations,  especially on the left where ICA stenosis measures 75%. Electronically Signed   By: Monte Fantasia M.D.   On: 11/19/2019 05:24   MR MRA HEAD WO CONTRAST  Result Date: 11/19/2019 CLINICAL DATA:  Stroke, follow-up EXAM: MRI HEAD WITHOUT CONTRAST MRA HEAD WITHOUT CONTRAST TECHNIQUE: Multiplanar, multiecho pulse sequences of the brain and surrounding structures were obtained without intravenous contrast. Angiographic images of the head were obtained using MRA technique without contrast. COMPARISON:  Recent CT imaging FINDINGS: MRI HEAD FINDINGS Brain: There are multiple small foci of reduced diffusion including involvement of the right hippocampus, right occipital lobe, left frontal lobe, left occipital lobe, bilateral cerebellum, and possibly the right thalamus. No evidence of intracranial hemorrhage. Prominence of the ventricles and sulci reflects generalized parenchymal volume loss. Patchy and confluent areas of T2 hyperintensity in the supratentorial white matter are nonspecific but probably reflect moderate chronic microvascular ischemic changes. There are chronic small vessel infarcts of the basal ganglia. There is no intracranial mass or mass effect. No extra-axial fluid collection. Vascular: Major vessel flow voids at the skull base are preserved. Skull and upper cervical spine: Normal marrow signal. Sinuses/Orbits: Minor paranasal sinus mucosal thickening. Bilateral lens replacements. Other: Sella is unremarkable. MRA HEAD FINDINGS There is preserved flow related enhancement of the intracranial internal carotid arteries. Atherosclerotic irregularity is present along the cavernous and paraclinoid portions causing moderate stenosis. Proximal anterior and middle cerebral arteries are patent. Intracranial vertebral arteries are patent with atherosclerotic irregularity. The left vertebral artery is dominant. There appears to be splitting of the distal right vertebral artery. Flow within the right vertebral  artery appears improved relative to the CTA. There is preserved flow related enhancement within the basilar artery and the proximal posterior cerebral arteries. A left posterior communicating arteries again identified. IMPRESSION: Several small foci of acute infarction as described, likely embolic. No evidence of hemorrhage. Compared to prior CTA, there is improved flow within the intracranial right vertebral artery, distal basilar artery, and right posterior cerebral artery. Chronic microvascular ischemic changes. Electronically Signed   By: Macy Mis M.D.   On: 11/19/2019 14:19   MR BRAIN WO CONTRAST  Result Date: 11/19/2019 CLINICAL DATA:  Stroke, follow-up EXAM: MRI HEAD WITHOUT CONTRAST MRA HEAD WITHOUT CONTRAST TECHNIQUE: Multiplanar, multiecho pulse sequences of the brain and surrounding structures were obtained without intravenous contrast. Angiographic images of the head were obtained using MRA technique without contrast. COMPARISON:  Recent CT imaging FINDINGS: MRI HEAD FINDINGS Brain: There are multiple small foci of reduced diffusion including involvement of the right hippocampus, right occipital lobe, left frontal lobe, left occipital lobe, bilateral cerebellum, and possibly the right thalamus. No evidence of intracranial hemorrhage. Prominence of the ventricles and sulci reflects generalized parenchymal volume loss. Patchy and confluent areas of T2 hyperintensity in the supratentorial white matter are nonspecific but probably reflect moderate chronic microvascular ischemic changes. There are chronic small vessel infarcts of the basal ganglia. There is no intracranial mass or mass effect. No extra-axial fluid collection. Vascular: Major vessel flow voids at the skull base are preserved. Skull and upper cervical spine: Normal marrow signal. Sinuses/Orbits: Minor paranasal  sinus mucosal thickening. Bilateral lens replacements. Other: Sella is unremarkable. MRA HEAD FINDINGS There is preserved  flow related enhancement of the intracranial internal carotid arteries. Atherosclerotic irregularity is present along the cavernous and paraclinoid portions causing moderate stenosis. Proximal anterior and middle cerebral arteries are patent. Intracranial vertebral arteries are patent with atherosclerotic irregularity. The left vertebral artery is dominant. There appears to be splitting of the distal right vertebral artery. Flow within the right vertebral artery appears improved relative to the CTA. There is preserved flow related enhancement within the basilar artery and the proximal posterior cerebral arteries. A left posterior communicating arteries again identified. IMPRESSION: Several small foci of acute infarction as described, likely embolic. No evidence of hemorrhage. Compared to prior CTA, there is improved flow within the intracranial right vertebral artery, distal basilar artery, and right posterior cerebral artery. Chronic microvascular ischemic changes. Electronically Signed   By: Macy Mis M.D.   On: 11/19/2019 14:19   CT C-SPINE NO CHARGE  Result Date: 11/19/2019 CLINICAL DATA:  Stroke follow-up. EXAM: CT CERVICAL SPINE WITHOUT CONTRAST TECHNIQUE: Multidetector CT imaging of the cervical spine was performed without intravenous contrast. Multiplanar CT image reconstructions were also generated. COMPARISON:  Cervical radiography 04/26/2011 FINDINGS: Alignment: No traumatic malalignment Skull base and vertebrae: Negative for acute fracture. Soft tissues and spinal canal: No prevertebral fluid or swelling. No visible canal hematoma. Disc levels: Usual degenerative changes with mainly lower cervical disc narrowing and endplate ridging. No evidence of impingement Upper chest: No acute finding IMPRESSION: No evidence of cervical spine injury. Electronically Signed   By: Monte Fantasia M.D.   On: 11/19/2019 06:54   CT Code Stroke Cerebral Perfusion with contrast  Result Date:  11/19/2019 CLINICAL DATA:  Right MCA syndrome EXAM: CT ANGIOGRAPHY HEAD AND NECK CT PERFUSION BRAIN TECHNIQUE: Multidetector CT imaging of the head and neck was performed using the standard protocol during bolus administration of intravenous contrast. Multiplanar CT image reconstructions and MIPs were obtained to evaluate the vascular anatomy. Carotid stenosis measurements (when applicable) are obtained utilizing NASCET criteria, using the distal internal carotid diameter as the denominator. Multiphase CT imaging of the brain was performed following IV bolus contrast injection. Subsequent parametric perfusion maps were calculated using RAPID software. CONTRAST:  166mL OMNIPAQUE IOHEXOL 350 MG/ML SOLN COMPARISON:  Head CT from earlier today FINDINGS: CTA NECK FINDINGS Aortic arch: Atherosclerotic plaque with 3 vessel branching. 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 The LIMA and upper venous graft are patent. Right carotid system: Bulky calcified plaque at the bifurcation without flow limiting stenosis or ulceration. No downstream beading or dissection. Left carotid system: Remarkably bulky calcified plaque at the bifurcation. ICA stenosis measures 75% on coronal reformats. No ulceration or dissection. Vertebral arteries: Proximal subclavian atherosclerosis without flow limiting stenosis. Atherosclerotic plaque on the dominant left V1 segment and V2 segment without flow limiting stenosis or beading. Faint flow in the non dominant right vertebral artery. Skeleton: Disc and facet degeneration. No acute or aggressive finding Other neck: Frothy secretions in the trachea. No acute or aggressive finding. Upper chest: Calcified granulomas in the apical lungs. Review of the MIP images confirms the above findings CTA HEAD FINDINGS Anterior circulation: Atherosclerotic plaque along both carotid siphons which is confluent. There is a severe stenosis at the supraclinoid right ICA segment with small vessel size limiting accurate measurement. No  downstream branch occlusion although there is less well filled right MCA branches. The paraclinoid left ICA is moderately stenotic and likely overestimated visually due to degree of calcified plaque  blooming. Stenosis is likely 50% or less. No downstream branch occlusion or beading. Atheromatous irregularity of medium size vessels. Posterior circulation: Strong left vertebral artery dominance. There is faint thready flow in the right vertebral artery with no meaningful contribution to the basilar. There is a high-grade short segment narrowing at the left vertebrobasilar junction. Abrupt loss of flow within the distal basilar with no flow seen in the P1 segments and throughout the majority of the right PCA territory. There is a moderate-sized left posterior communicating artery. Less intense enhancement is seen within the vertebrobasilar arteries compared to the carotids. Distal basilar occlusion is new from flow voids in 2014 brain MRI Venous sinuses: Unremarkable in the arterial phase Anatomic variants: As above Review of the MIP images confirms the above findings CT Brain Perfusion Findings: ASPECTS: 10 CBF (<30%) Volume: 66mL Perfusion (Tmax>6.0s) volume: 7mL Ischemia is seen in the right PCA territory These results were called by telephone at the time of interpretation on 11/19/2019 at 5:18 am to provider [facility]NEILL [facility] , who verbally acknowledged these results. IMPRESSION: 1. 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 2. Severe right supraclinoid ICA stenosis with right MCA branch underfilling, chronic appearing 3. High-grade narrowing at the left vertebrobasilar junction. No meaningful contribution to the basilar by the non dominant right vertebral artery. 4. Heavily calcified carotid bifurcations, especially on the left where ICA stenosis measures 75%. Electronically Signed   By: Monte Fantasia M.D.   On: 11/19/2019 05:24    ECHOCARDIOGRAM COMPLETE  Result Date: 11/19/2019   ECHOCARDIOGRAM REPORT   Patient Name:   Kenneth [name] Date of Exam: 11/19/2019 Medical Rec #:  [id number]        Height:       70.0 in Accession #:    [id number]       Weight:       148.1 lb Date of Birth:  1928/03/08        BSA:          1.84 m Patient Age:    85 years         BP:           124/101 mmHg Patient Gender: M                HR:           106 bpm. Exam Location:  Inpatient Procedure: 2D Echo Indications:    Stroke 434.91 / I163.9  History:        Patient has prior history of Echocardiogram examinations, most                 recent 11/12/2019. Acute ST elevation myocardial infarction                 (STEMI) of posterior wall and CAD; Risk Factors:Hypertension,                 Diabetes and Dyslipidemia. Elevated troponin.  Sonographer:    Vikki Ports Turrentine Referring Phys: [place]  1. Left ventricular ejection fraction, by visual estimation, is 20 to 25%. The left ventricle has normal function. There is no left ventricular hypertrophy.  2. Left ventricular diastolic parameters are indeterminate.  3. The left ventricle demonstrates global hypokinesis.  4. COmpared to previous echo on 11/12/19, LVEF is unchanged.  5. Global right ventricle mild to moderately reduced..The right ventricular size is normal. No increase in right ventricular  wall thickness.  6. Left atrial size was moderately dilated.  7. Right atrial size was normal.  8. The mitral valve is grossly normal. Moderate mitral valve regurgitation.  9. The tricuspid valve is normal in structure. Tricuspid valve regurgitation is trivial. 10. The aortic valve is tricuspid. Aortic valve regurgitation is not visualized. Mild aortic valve stenosis. 11. The pulmonic valve was normal in structure. Pulmonic valve regurgitation is not visualized. 12. The aortic root was not well visualized. 13. The inferior vena cava is normal in size with greater than 50% respiratory  variability, suggesting right atrial pressure of 3 mmHg. FINDINGS  Left Ventricle: Left ventricular ejection fraction, by visual estimation, is 20 to 25%. The left ventricle has normal function. The left ventricle demonstrates global hypokinesis. There is no left ventricular hypertrophy. Left ventricular diastolic parameters are indeterminate. COmpared to previous echo on 11/12/19, LVEF is unchanged. Right Ventricle: The right ventricular size is normal. No increase in right ventricular wall thickness. Global RV systolic function is mild to moderately reduced. Left Atrium: Left atrial size was moderately dilated. Right Atrium: Right atrial size was normal in size Pericardium: There is no evidence of pericardial effusion. Mitral Valve: The mitral valve is grossly normal. Moderate mitral valve regurgitation. Tricuspid Valve: The tricuspid valve is normal in structure. Tricuspid valve regurgitation is trivial. Aortic Valve: The aortic valve is tricuspid. Aortic valve regurgitation is not visualized. Mild aortic stenosis is present. Pulmonic Valve: The pulmonic valve was normal in structure. Pulmonic valve regurgitation is not visualized. Pulmonic regurgitation is not visualized. Aorta: The aortic root was not well visualized. Venous: The inferior vena cava is normal in size with greater than 50% respiratory variability, suggesting right atrial pressure of 3 mmHg. IAS/Shunts: No atrial level shunt detected by color flow Doppler.  LEFT VENTRICLE PLAX 2D LVIDd:         4.90 cm LVIDs:         4.40 cm LV PW:         1.00 cm LV IVS:        1.00 cm LVOT diam:     2.30 cm LV SV:         25 ml LV SV Index:   13.82 LVOT Area:     4.15 cm  LEFT ATRIUM              Index       RIGHT ATRIUM           Index LA diam:        4.40 cm  2.39 cm/m  RA Area:     14.60 cm LA Vol (A2C):   103.0 ml 56.06 ml/m RA Volume:   37.70 ml  20.52 ml/m LA Vol (A4C):   75.0 ml  40.82 ml/m LA Biplane Vol: 90.1 ml  49.04 ml/m  AORTIC VALVE LVOT  Vmax:   70.70 cm/s LVOT Vmean:  48.133 cm/s LVOT VTI:    0.121 m  AORTA Ao Root diam: 3.20 cm  SHUNTS Systemic VTI:  0.12 m Systemic Diam: 2.30 cm  Dorris Carnes MD Electronically signed by Dorris Carnes MD Signature Date/Time: 11/19/2019/12:02:49 PM    Final    CT HEAD CODE STROKE WO CONTRAST  Result Date: 11/19/2019 CLINICAL DATA:  Code stroke.  Ataxia with stroke suspected EXAM: CT HEAD WITHOUT CONTRAST TECHNIQUE: Contiguous axial images were obtained from the base of the skull through the vertex without intravenous contrast. COMPARISON:  Brain MRI 07/11/2013 FINDINGS: Brain: No evidence of acute infarction,  hemorrhage, hydrocephalus, extra-axial collection or mass lesion/mass effect. Generalized atrophy. Chronic ischemic injury about the left basal ganglia in the lateral lenticulostriate distribution. Small vessel ischemic gliosis in the white matter is mild. Cerebral volume loss in keeping with age. Vascular: Atherosclerotic calcification that is prominent. No hyperdense vessel. Skull: Normal. Negative for fracture or focal lesion. Sinuses/Orbits: Bilateral cataract resection. Other: These results were communicated to Dr. Leonel Ramsay at 4:54 amon 12/14/2020by text page via the [facility] messaging system. ASPECTS [facility] Stroke Program Early CT Score) Not scored with this history IMPRESSION: Aging brain without acute or reversible finding. Electronically Signed   By: Monte Fantasia M.D.   On: 11/19/2019 04:57    Labs:  CBC: Recent Labs    11/14/19 0401 11/15/19 0612 11/15/19 [id number] 11/15/19 2326 11/19/19 0432 11/19/19 0512  WBC 11.4* 9.9  --  10.3 8.2  --   HGB 8.5* 8.8* 9.1* 8.8* 10.4* 11.2*  HCT 26.7* 27.4* 28.4* 28.2* 33.8* 33.0*  PLT 417* 483*  --  503* 679*  --     COAGS: Recent Labs    11/12/19 1045 11/19/19 0432  INR 1.1 1.0  APTT 34 30    BMP: Recent Labs    11/13/19 0342 11/15/19 0612 11/16/19 0143 11/19/19 0432 11/19/19 0512  NA 136 138 138 141 141  K 3.4* 4.2 3.9 4.1  4.2  CL 105 108 107 108 107  CO2 23 22 22  20*  --   GLUCOSE 192* 156* 134* 177* 168*  BUN 14 8 10 13 16   CALCIUM 7.8* 8.3* 8.4* 8.8*  --   CREATININE 0.76 0.79 0.78 0.96 0.80  GFRNONAA >60 >60 >60 >60  --   GFRAA >60 >60 >60 >60  --     LIVER FUNCTION TESTS: Recent Labs    11/02/19 0815 11/12/19 1045 11/19/19 0432  BILITOT 1.1 0.6 0.4  AST 21 22 22   ALT 11 20 14   ALKPHOS 113 63 66  PROT 7.8 5.6* 6.5  ALBUMIN 3.7 2.2* 2.7*    Assessment and Plan:  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 Estanislado Pandy. Patient's condition improving- can spontaneously move all extremities, speech clear. Right groin incision stable, distal pulses palpable bilaterally with Doppler. Plan to follow-up with Dr. Estanislado Pandy in clinic 4 weeks after discharge- order placed to facilitate this. Further plans per neurology- appreciate and agree with management. NIR to follow.   Electronically Signed: Earley Abide, PA-C 11/19/2019, 3:32 PM   I spent a total of 25 Minutes at the the patient's bedside AND on the patient's hospital floor or unit, greater than 50% of which was counseling/coordinating care for basilar artery occlusion s/p revascularization.

## 2019-11-19 NOTE — Anesthesia Preprocedure Evaluation (Addendum)
Anesthesia Evaluation  Patient identified by MRN, date of birth, ID band  Reviewed: Allergy & Precautions, NPO status , Patient's Chart, lab work & pertinent test resultsPreop documentation limited or incomplete due to emergent nature of procedure.  History of Anesthesia Complications (+) PONV  Airway Mallampati: III       Dental  (+) Missing, Partial Lower, Partial Upper   Pulmonary former smoker,    Pulmonary exam normal        Cardiovascular hypertension, Pt. on medications and Pt. on home beta blockers + CAD, + Past MI, + Cardiac Stents, + CABG and +CHF   Rate:Tachycardia  ECG: rate 124. Sinus arrhythmia Multiple premature complexes, vent & supraven LVH with secondary repolarization abnormality  ECHO: 1. Severely reduce LVEF, with akinesis and hypokinesis in septal and inferior walls. Changed from prior echo in 2019. 2. Left ventricular ejection fraction, by visual estimation, is 20 to 25%. The left ventricle has severely decreased function. There is no left ventricular hypertrophy. 3. Multiple segmental abnormalities exist. See findings. 4. Definity contrast agent was given IV to delineate the left ventricular endocardial borders. 5. Left ventricular diastolic parameters are indeterminate. 6. The left ventricle demonstrates regional wall motion abnormalities. 7. Global right ventricle has moderately reduced systolic function.The right ventricular size is normal. No increase in right ventricular wall thickness. 8. Left atrial size was mildly dilated. 9. Right atrial size was mildly dilated. 10. The mitral valve is grossly normal. Mild mitral valve regurgitation. 11. The tricuspid valve is normal in structure. Tricuspid valve regurgitation is trivial. 12. The aortic valve is tricuspid. Aortic valve regurgitation is not visualized. Mild aortic valve sclerosis without stenosis. 13. The pulmonic valve was not well visualized.  Pulmonic valve regurgitation NA.   Neuro/Psych Anxiety Left-sided weakness CVA  C-spine not cleared    GI/Hepatic Neg liver ROS, hiatal hernia, GERD  Medicated,  Endo/Other  diabetes, Oral Hypoglycemic AgentsHypothyroidism   Renal/GU negative Renal ROS     Musculoskeletal negative musculoskeletal ROS (+) Gout   Abdominal   Peds  Hematology  (+) anemia , HLD   Anesthesia Other Findings Code Stroke  Reproductive/Obstetrics                            Anesthesia Physical Anesthesia Plan  ASA: IV and emergent  Anesthesia Plan: General   Post-op Pain Management:    Induction: Intravenous and Rapid sequence  PONV Risk Score and Plan: 3 and Ondansetron and Dexamethasone  Airway Management Planned: Oral ETT  Additional Equipment: Arterial line  Intra-op Plan:   Post-operative Plan: Possible Post-op intubation/ventilation  Informed Consent:     Only emergency history available  Plan Discussed with: CRNA  Anesthesia Plan Comments: (Emergency consent for anesthesia obtained)       Anesthesia Quick Evaluation

## 2019-11-19 NOTE — Progress Notes (Signed)
Notified Dr Rory Percy of patients SBP running 105-115 but MAP >75. No changes in neuro status. Not reaching goal of 120-140. New order received for NS bolus 234ml and SBP goal changed to 110-140 per Dr Rory Percy.

## 2019-11-19 NOTE — Progress Notes (Signed)
  Echocardiogram 2D Echocardiogram has been performed.  Stefania Goulart A Jawanna Dykman 11/19/2019, 9:43 AM

## 2019-11-19 NOTE — Anesthesia Postprocedure Evaluation (Signed)
Anesthesia Post Note  Patient: Kenneth [name]  Procedure(s) Performed: RADIOLOGY WITH ANESTHESIA (N/A )     Patient location during evaluation: Other Anesthesia Type: General Level of consciousness: awake and alert Pain management: pain level controlled Vital Signs Assessment: post-procedure vital signs reviewed and stable Respiratory status: spontaneous breathing, nonlabored ventilation and respiratory function stable Cardiovascular status: blood pressure returned to baseline and tachycardic Postop Assessment: no apparent nausea or vomiting Anesthetic complications: no    Last Vitals:  Vitals:   11/19/19 0515 11/19/19 0530  BP: (!) 130/91 124/90  Pulse: (!) 107 (!) 101  Resp: 14 (!) 23  Temp:    SpO2: 98% 99%    Last Pain:  Vitals:   11/19/19 0512  TempSrc: Oral                 Brennan Bailey

## 2019-11-19 NOTE — Transfer of Care (Signed)
Immediate Anesthesia Transfer of Care Note  Patient: Kenneth [name]  Procedure(s) Performed: RADIOLOGY WITH ANESTHESIA (N/A )  Patient Location: NICU  Anesthesia Type:General  Level of Consciousness: awake and patient cooperative  Airway & Oxygen Therapy: Patient Spontanous Breathing and Patient connected to nasal cannula oxygen  Post-op Assessment: Report given to RN, Post -op Vital signs reviewed and stable and Patient moving all extremities X 4  Post vital signs: Reviewed and stable  Last Vitals:  Vitals Value Taken Time  BP    Temp    Pulse    Resp 22 11/19/19 0825  SpO2    Vitals shown include unvalidated device data.  Last Pain:  Vitals:   11/19/19 0512  TempSrc: Oral         Complications: No apparent anesthesia complications

## 2019-11-19 NOTE — H&P (Signed)
Neurology Consultation Reason for Consult: Left-sided weakness Referring Physician: Tyrone Nine, D  CC: Left-sided weakness  History is obtained from: Patient  HPI: Kenneth [name] is a 85 y.o. male he was recently admitted for STEMI with stenting of the venous graft supplying the PDA.  He had just recently been discharged and then went to bed last night normally. His wife called his daughter and stated that he was having trouble getting out of bed and needed to go to the bathroom because he could not move his left side.  His daughter then went over and in the interim the patient's wife had tried to get the patient out of bed and he had fallen.  She noticed that he could not move his left side and called 911.  Prior to his MI, he is able to do everything that he typically would want to do.  He lives independently and took care of his wife with dementia.  Since the heart attack, his daughter had been helping him out some, but he was progressing rapidly with physical therapy.    LKW: 8 PM tpa given?: no, outside of window  Premorbid modified rankin scale: 1 NIHSS: 16  ROS:  Unable to obtain due to altered mental status.   Past Medical History:  Diagnosis Date  . Anemia    on iron  . Anxiety   . Arthritis   . Basal cell carcinoma    left ear  . CAD (coronary artery disease)    a. CAD s/p CABG 1993.  [name] Chronic cough   . Chronic rhinitis   . Complication of anesthesia   . Diabetes ([place])   . Diverticulosis of colon   . GERD (gastroesophageal reflux disease)   . Glaucoma   . H/O hiatal hernia   . Hernia    left side  . History of gout   . History of kidney stones   . HTN (hypertension)   . Hyperlipidemia `  . Melanoma of skin, site unspecified   . PONV (postoperative nausea and vomiting)   . [facility] spotted fever 2-3 yrs ago  . Thyroid disease      Family History  Problem Relation Age of Onset  . CAD Father   . Hyperlipidemia Father   . Cancer Mother      Social  History:  reports that he has quit smoking. His smoking use included cigars. He has never used smokeless tobacco. He reports current alcohol use. He reports that he does not use drugs.   Exam: Current vital signs: Vitals:   11/19/19 0515 11/19/19 0530  BP: (!) 130/91 124/90  Pulse: (!) 107 (!) 101  Resp: 14 (!) 23  Temp:    SpO2: 98% 99%   Vital signs in last 24 hours:   Physical Exam  Constitutional: Appears well-developed and well-nourished.  Psych: Affect appropriate to situation Eyes: No scleral injection HENT: No OP obstrucion MSK: no joint deformities.  Cardiovascular: Normal rate and regular rhythm.  Respiratory: Effort normal, non-labored breathing GI: Soft.  No distension. There is no tenderness.  Skin: WDI  Neuro: Mental Status: Patient is awake, alert, oriented to person, month, age, significant left neglect.  Cranial Nerves: II: Left hemianopia.  pupils are reactive to light bilaterally III,IV, VI: Right gaze preference, but able to cross midline to the left.  V: Facial sensation is symmetric to temperature VII: Facial movement with mild decreased NL fold on the left.  Motor: He has 3/5 weakness of the left arm,  2/5 in the left leg.  He has 5/5 strength in the right arm and 4/5 in the right leg.  Sensory: Sensation is diminished on the left Cerebellar: Consistent with weakness on the left.   I have reviewed labs in epic and the results pertinent to this consultation are: BMP - unremarkable  I have reviewed the images obtained: CT head - unremarkable  Impression: 85 yo M with acute ischemic stroke due to distal basilar occlusion with 18cc penumbra. I have discussed treatment options with his daughter who has agreed with proceeding with angiogram and mechanical embolectomy.    Recommendations: - HgbA1c, fasting lipid panel - MRI of the brain without contrast - Frequent neuro checks - Echocardiogram - Prophylactic therapy-Antiplatelet med: Aspirin - dose  325mg  PO or 300mg  PR and ticagrelor - Risk factor modification - Telemetry monitoring - PT consult, OT consult, Speech consult - Stroke team to follow  DM-  SSI  Recent STEMI-  Continue ASA and brilinta Continue isosorbide  Hyperlipidemia- Continue crestor   This patient is critically ill and at significant risk of neurological worsening, death and care requires constant monitoring of vital signs, hemodynamics,respiratory and cardiac monitoring, neurological assessment, discussion with family, other specialists and medical decision making of high complexity. I spent 50 minutes of neurocritical care time  in the care of  this patient. This was time spent independent of any time provided by nurse practitioner or PA.  Roland Rack, MD [facility] [phone]  If 7pm- 7am, please page neurology on call as listed in [place]. 11/19/2019  6:11 AM

## 2019-11-19 NOTE — Progress Notes (Signed)
STROKE TEAM PROGRESS NOTE   INTERVAL HISTORY Pt lying in bed in C-collar. Screaming for pain in the sternal bone due to C-collar. Able to remove C-collar and he moved head left right up and down without pain. C-collar formerly removed. Pt seems to have b/l equal strength although difficulty with just removed sheath in the groin area. Tele showed sinus arrhythmia but not afib on EKG. Pending swallow.    Vitals:   11/19/19 0512 11/19/19 0515 11/19/19 0530  BP: 135/76 (!) 130/91 124/90  Pulse: (!) 108 (!) 107 (!) 101  Resp: (!) 21 14 (!) 23  Temp: (!) 97.4 F (36.3 C)    TempSrc: Oral    SpO2: 92% 98% 99%    CBC:  Recent Labs  Lab 11/15/19 2326 11/19/19 0432 11/19/19 0512  WBC 10.3 8.2  --   NEUTROABS  --  6.3  --   HGB 8.8* 10.4* 11.2*  HCT 28.2* 33.8* 33.0*  MCV 100.0 101.2*  --   PLT 503* 679*  --     Basic Metabolic Panel:  Recent Labs  Lab 11/16/19 0143 11/19/19 0432 11/19/19 0512  NA 138 141 141  K 3.9 4.1 4.2  CL 107 108 107  CO2 22 20*  --   GLUCOSE 134* 177* 168*  BUN 10 13 16   CREATININE 0.78 0.96 0.80  CALCIUM 8.4* 8.8*  --    Lipid Panel:     Component Value Date/Time   CHOL 94 11/12/2019 1041   TRIG 93 11/12/2019 1041   HDL 25 (L) 11/12/2019 1041   CHOLHDL 3.8 11/12/2019 1041   VLDL 19 11/12/2019 1041   LDLCALC 50 11/12/2019 1041   HgbA1c:  Lab Results  Component Value Date   HGBA1C 6.8 (H) 11/12/2019   Urine Drug Screen: No results found for: LABOPIA, COCAINSCRNUR, LABBENZ, AMPHETMU, THCU, LABBARB  Alcohol Level     Component Value Date/Time   ETH <10 11/19/2019 0432    IMAGING  MRI/MRI pending   Cerebral Angio 11/19/2019 S/P [facility] angiogram followed by complete revascularization of basilar artery with x 1 pass with 38mm x 40 mm solitaire X retriever device and penumbra aspiration achieving a TICI 3 revascularization. 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  CT C-SPINE NO CHARGE 11/19/2019 0654 No evidence of cervical  spine injury.   CT Code Stroke CTA Head W/WO contrast CT Code Stroke CTA Neck W/WO contrast CT Code Stroke Cerebral Perfusion with contrast 11/19/2019 0524 1. 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 2. Severe right supraclinoid ICA stenosis with right MCA branch underfilling, chronic appearing 3. High-grade narrowing at the left vertebrobasilar junction. No meaningful contribution to the basilar by the non dominant right vertebral artery. 4. Heavily calcified carotid bifurcations, especially on the left where ICA stenosis measures 75%.   CT HEAD CODE STROKE WO CONTRAST 11/19/2019 0457 Aging brain without acute or reversible finding.    PHYSICAL EXAM  Temp:  [97.4 F (36.3 C)] 97.4 F (36.3 C) (12/14 0512) Pulse Rate:  [101-108] 101 (12/14 0530) Resp:  [14-23] 23 (12/14 0530) BP: (124-135)/(76-91) 124/90 (12/14 0530) SpO2:  [92 %-99 %] 99 % (12/14 0530)  General - Well nourished, well developed, mild distress due to pain with C-collar.  Ophthalmologic - fundi not visualized due to noncooperation.  Cardiovascular - irregularly irregular heart rate and rhythm, but on tele and EKG showed sinus arrhythmia with PCAs and PVCs.  Mental Status -  Level of arousal and orientation to time, place, and person were intact. Language including expression, naming, repetition, comprehension was assessed and found intact.  Cranial Nerves II - XII - II - Visual field intact OU. III, IV, VI - Extraocular movements intact. V - Facial sensation intact bilaterally. VII - Facial movement intact bilaterally. VIII - Hearing & vestibular intact bilaterally. X - Palate elevates symmetrically. XI - Chin turning & shoulder shrug intact bilaterally. XII - Tongue protrusion intact.  Motor Strength - The patient's strength was symmetrical in all extremities and pronator drift was absent.  Bulk was normal and fasciculations were  absent.   Motor Tone - Muscle tone was assessed at the neck and appendages and was normal.  Reflexes - The patient's reflexes were symmetrical in all extremities and he had no pathological reflexes.  Sensory - Light touch, temperature/pinprick were assessed and were symmetrical.    Coordination - The patient had normal movements in the hands with no ataxia or dysmetria.  Tremor was absent.  Gait and Station - deferred.   ASSESSMENT/PLAN Mr. VOLVY SALLER is a 85 y.o. male with history of recent STEMI s/p stenting who was d/c home 12/11, developing L sided weakness. Found to have a BA occlusion and sent to IR.    Stroke: possible R PCA infarct in setting of BA and right PCA occlusion s/p IR with TICI3 reperfusion, likely secondary to large vessel disease source    Code Stroke CT head No acute abnormality.  CTA head & neck distal BA occlusion w/ thready flow R PCA w/ 18cc penumbra. Severe R supraclinoid ICA stenosis w/ R MCA branch underfilling (chronic). High-grade narrowing L VBJ. Heavily calcified ICA bifurcations, L 75%.  CT perfusion R PCA w/ 18cc penumbra  Cerebral angio TICI 3 revascularization of BA. 50-60% stenosis proximal BA.  Post IR CT NO ICH or mass effect.   MRI  pending  MRA  pending  2D Echo pending, EF 20-25% on 11/12/19  LDL pending  HgbA1c pending  SCDs for VTE prophylaxis  Brilinta (ticagrelor) 90 mg bid prior to admission, now on aspirin 325 mg daily and Brilinta (ticagrelor) 90 mg bid.   Therapy recommendations:  pending   Disposition:  pending (caregiver for wife who has dementia)  Recent STEMI s/p stenting  11/12/19 admitted by cardiology  Cardiac cath - severe multivessle CAD  S/p stenting RCA  On brilinta only due to hx of GIB   Hx of CABG 1993  Severe intracranial atherosclerosis  CTA head and neck showed Severe R supraclinoid ICA stenosis w/ R MCA branch underfilling (chronic). Severe left ICA siphon high grade stenosis.  High-grade narrowing L VBJ. Heavily calcified ICA bifurcations bilaterally L>R, with L 75%.  On DAPT  Avoid low BP  Long-term BP goal 130-150 given severe intracranial stenosis  Cardiomyopathy   EF 20-25% 11/12/19  Current TTE pending  Cardiology following  On ASA and brilinta  Gentle hydration, consider po after passing swallow  Hx of GIB  Recent diverticulitis and large HH w/ hx colectomy 2-3 yrs ago, treated with cipro and flagyl. d/c 11/04/2019  Hx remote GIB on ASA  Hb 8.5 on 11/14/19  No ASA as per cardiology post stenting due to hx of GIB  Currently Hb 11.2  On ASA and brilinta  Close monitoring  Hypertension  Home meds:  imdur 60, losartan 12.5, metoprolol 25  Stable . BP goal 120-140 x 24h post IR . Long-term BP goal 130-150 given  severe intracranial stenosis  Hyperlipidemia  Home meds:  crestor 20, resumed in hospital  LDL 50, goal < 70  Continue statin at discharge  Diabetes type II Controlled  Home meds:  Metformin 500 acb  HgbA1c 6.8, goal < 7.0  CBGs  SSI  Dysphagia . Secondary to stroke . NPO . Speech on board   Other Stroke Risk Factors  Advanced age  Former Cigarette smoker  ETOH use, alcohol level <10, advised to drink no more than 2 drink(s) a day  Other Active Problems  Hypothyroid on synthroid  C-collar cleared after CT C-spine and pain free neck movement   Hospital day # 0  This patient is critically ill due to  and at significant risk of neurological worsening, death form BA occlusion, severe intracranial stenosis, recent STEMI, low EF, hx of CABG and GIB, CHF. This patient's care requires constant monitoring of vital signs, hemodynamics, respiratory and cardiac monitoring, review of multiple databases, neurological assessment, discussion with family, other specialists and medical decision making of high complexity. I spent 45 minutes of neurocritical care time in the care of this patient.  Rosalin Hawking, MD  PhD Stroke Neurology 11/19/2019 11:02 AM    To contact Stroke Continuity provider, please refer to [email or web address] After hours, contact General Neurology

## 2019-11-19 NOTE — Progress Notes (Signed)
Report given to 4n RN

## 2019-11-19 NOTE — Code Documentation (Addendum)
Responded to Code Stroke called at 0400 for L sided weakness, facial droop, and slurred speech. Daughter spoke with pt at 2000 last night with no slurred speech. Wife(who has dementia) called daughter around 72 because pt saying he needed to go to bathroom but couldn't get up. Daughter came to patient's house and found he had fallen out of bed and had L sided weakness and slurred speech. LSN-2000, CBG-213, NIH-16, CT head negative for acute changes. CTA-distal basilar artery occlusion. CTP-18cc prenumbra. IR paged out at 0525. Foley placed, pulses marked, and groin clipped. Pt transported to IR at 0535.

## 2019-11-19 NOTE — ED Notes (Signed)
Pt valuables (3 rings, 1 Watch)  placed in pink denture cup with label on it.

## 2019-11-19 NOTE — ED Provider Notes (Signed)
[facility] EMERGENCY DEPARTMENT Provider Note   CSN: [id number] Arrival date & time: 11/19/19  0430  An emergency department physician performed an initial assessment on this suspected stroke patient at 50.  History Chief Complaint  Patient presents with  . Code Stroke    Kenneth [name] is a 85 y.o. male.  85 yo M with a chief complaints of left-sided neglect and weakness.  This was noticed this morning had about 230 when he tried to get up to use the bathroom.  Was unable to get up out of bed.  Required significant assistance and then fell to the ground.  EMS was called.  Noted to have left-sided neglect.  Made a code LVO.  The history is provided by the patient and the spouse.  Illness Severity:  Moderate Onset quality:  Gradual Duration:  2 hours Timing:  Constant Progression:  Unchanged Chronicity:  New Associated symptoms: no abdominal pain, no chest pain, no congestion, no diarrhea, no fever, no headaches, no myalgias, no rash, no shortness of breath and no vomiting        Past Medical History:  Diagnosis Date  . Anemia    on iron  . Anxiety   . Arthritis   . Basal cell carcinoma    left ear  . CAD (coronary artery disease)    a. CAD s/p CABG 1993.  [name] Chronic cough   . Chronic rhinitis   . Complication of anesthesia   . Diabetes ([place])   . Diverticulosis of colon   . GERD (gastroesophageal reflux disease)   . Glaucoma   . H/O hiatal hernia   . Hernia    left side  . History of gout   . History of kidney stones   . HTN (hypertension)   . Hyperlipidemia `  . Melanoma of skin, site unspecified   . PONV (postoperative nausea and vomiting)   . [facility] spotted fever 2-3 yrs ago  . Thyroid disease     Patient Active Problem List   Diagnosis Date Noted  . Acute systolic heart failure ([place]) 11/16/2019  . Acute ST elevation myocardial infarction (STEMI) of posterior wall ([place]) 11/12/2019  . Acute myocardial infarction ([place])     . Diverticulitis 11/02/2019  . Colostomy status ([place]) 03/16/2016  . Elevated troponin 09/13/2015  . Hemoptysis   . Nausea and vomiting 09/09/2015  . Small bowel obstruction ([place]) 09/09/2015  . Hypertensive urgency 09/09/2015  . Type 2 diabetes mellitus with complication, without long-term current use of insulin ([place]) 09/09/2015  . Colonic mass   . Intestinal obstruction ([place])   . Preop cardiovascular exam 06/27/2014  . Left inguinal hernia 06/26/2014  . Dysphagia 09/28/2013    Class: Chronic  . Abnormal weight loss 09/28/2013    Class: Chronic  . HTN (hypertension)   . Coronary artery disease involving coronary bypass graft of native heart without angina pectoris   . Hyperlipidemia with target LDL less than 70   . GERD (gastroesophageal reflux disease)   . Nephrolithiasis   . Melanoma of skin, site unspecified   . Glaucoma     Past Surgical History:  Procedure Laterality Date  . COLONOSCOPY WITH PROPOFOL N/A 11/27/2013   Procedure: COLONOSCOPY WITH PROPOFOL;  Surgeon: Garlan Fair, MD;  Location: [facility];  Service: Endoscopy;  Laterality: N/A;  . COLOSTOMY TAKEDOWN N/A 03/16/2016   Procedure: COLOSTOMY TAKEDOWN;  Surgeon: Excell Seltzer, MD;  Location: [facility];  Service: General;  Laterality: N/A;  .  CORONARY ARTERY BYPASS GRAFT  1990's   x 3  . CORONARY/GRAFT ACUTE MI REVASCULARIZATION N/A 11/12/2019   Procedure: CORONARY/GRAFT ACUTE MI REVASCULARIZATION;  Surgeon: Troy Sine, MD;  Location: [facility] CV LAB;  Service: Cardiovascular;  Laterality: N/A;  . HERNIA REPAIR  1970's,15   lft ing hernia  . INGUINAL HERNIA REPAIR Left 06/28/2014   Procedure: Left  INGUINAL HERNIA REPAIR ;  Surgeon: Merrie Roof, MD;  Location: [facility];  Service: General;  Laterality: Left;  . INSERTION OF MESH Left 06/28/2014   Procedure: INSERTION OF MESH;  Surgeon: Merrie Roof, MD;  Location: [facility];  Service: General;  Laterality: Left;  . INSERTION OF MESH N/A 10/11/2014  .  LAPAROSCOPY  10/11/2014  . LAPAROTOMY N/A 09/11/2015   Procedure: EXPLORATORY LAPAROTOMY;  Surgeon: Excell Seltzer, MD;  Location: [facility];  Service: General;  Laterality: N/A;  . MASS EXCISION Left 03/21/2017   Procedure: EXCISION BASAL CELL CARCINOMA LEFT EAR;  Surgeon: Leta Baptist, MD;  Location: [facility];  Service: ENT;  Laterality: Left;  . PARTIAL COLECTOMY N/A 09/11/2015   Procedure: SIGMOID COLECTOMY AND COLOSTOMY;  Surgeon: Excell Seltzer, MD;  Location: [facility];  Service: General;  Laterality: N/A;  . right leg fracture age 73    . SKIN FULL THICKNESS GRAFT Left 03/21/2017   Procedure: FULL THICKNESS SKIN GRAFT TO LEFT EAR;  Surgeon: Leta Baptist, MD;  Location: [facility];  Service: ENT;  Laterality: Left;  . TONSILLECTOMY  age 43 or 11  . VENTRAL HERNIA REPAIR  10/11/2014   Procedure: HERNIA REPAIR VENTRAL ADULT;  Surgeon: Autumn Messing III, MD;  Location: [facility];  Service: General;;       Family History  Problem Relation Age of Onset  . CAD Father   . Hyperlipidemia Father   . Cancer Mother     Social History   Tobacco Use  . Smoking status: Former Smoker    Types: Cigars  . Smokeless tobacco: Never Used  Substance Use Topics  . Alcohol use: Yes    Comment: RARE  . Drug use: No    Comment: occ cigars    Home Medications Prior to Admission medications   Medication Sig Start Date End Date Taking? Authorizing Provider  acetaminophen (TYLENOL) 325 MG tablet Take 325 mg by mouth every 6 (six) hours as needed for mild pain.     [provider]  Artificial Tear Solution (SOOTHE XP OP) Apply 1 drop to eye as needed (dry eyes).     [provider]  Cyanocobalamin (VITAMIN B-12 PO) Take 1 tablet by mouth daily.    [provider]  ferrous sulfate 325 (65 FE) MG tablet Take 325 mg by mouth daily with breakfast.    [provider]  isosorbide mononitrate (IMDUR) 60 MG 24 hr tablet Take 60 mg by mouth every evening.      [provider]  latanoprost (XALATAN) 0.005 % ophthalmic solution Place 1 drop into both eyes at bedtime.    [provider]  levothyroxine (SYNTHROID, LEVOTHROID) 75 MCG tablet Take 50 mcg by mouth daily before breakfast.     [provider]  losartan (COZAAR) 25 MG tablet Take 0.5 tablets (12.5 mg total) by mouth daily. 11/16/19   Cheryln Manly, NP  metFORMIN (GLUCOPHAGE) 1000 MG tablet Take 500 mg by mouth daily with breakfast.     [provider]  metoprolol succinate (TOPROL-XL) 50 MG 24  hr tablet TAKE ONE-HALF (1/2) TABLET BY MOUTH DAILY WITH OR AFTER A MEAL Patient taking differently: Take 25 mg by mouth daily. WITH OR AFTER A MEAL 10/29/19   Belva Crome, MD  Multiple Vitamin (MULTIVITAMIN WITH MINERALS) TABS tablet Take 1 tablet by mouth daily.    [provider]  nitroGLYCERIN (NITROSTAT) 0.4 MG SL tablet Place 1 tablet (0.4 mg total) under the tongue every 5 (five) minutes as needed. 11/16/19   Cheryln Manly, NP  ondansetron (ZOFRAN) 4 MG tablet Take 1 tablet (4 mg total) by mouth every 6 (six) hours as needed for nausea. 11/04/19   Alma Friendly, MD  pantoprazole (PROTONIX) 40 MG tablet Take 1 tablet (40 mg total) by mouth 2 (two) times daily for 30 days, THEN 1 tablet (40 mg total) daily. 11/16/19 01/15/20  Cheryln Manly, NP  potassium chloride (K-DUR,KLOR-CON) 10 MEQ tablet Take 10 mEq by mouth every Monday, Wednesday, and Friday.  06/27/18   [provider]  rosuvastatin (CRESTOR) 20 MG tablet Take 1 tablet (20 mg total) by mouth daily at 6 PM. 11/16/19   Cheryln Manly, NP  ticagrelor (BRILINTA) 90 MG TABS tablet Take 1 tablet (90 mg total) by mouth 2 (two) times daily. 11/16/19   Cheryln Manly, NP    Allergies    Lasix [furosemide], Spironolactone, Aspirin, Lipitor [atorvastatin], Lopressor [metoprolol tartrate], and Penicillins  Review of Systems   Review of Systems  Constitutional: Negative  for chills and fever.  HENT: Negative for congestion and facial swelling.   Eyes: Negative for discharge and visual disturbance.  Respiratory: Negative for shortness of breath.   Cardiovascular: Negative for chest pain and palpitations.  Gastrointestinal: Negative for abdominal pain, diarrhea and vomiting.  Musculoskeletal: Negative for arthralgias and myalgias.  Skin: Negative for color change and rash.  Neurological: Positive for speech difficulty and weakness (left sided). Negative for tremors, syncope and headaches.  Psychiatric/Behavioral: Negative for confusion and dysphoric mood.    Physical Exam Updated Vital Signs BP 124/90   Pulse (!) 101   Temp (!) 97.4 F (36.3 C) (Oral)   Resp (!) 23   SpO2 99%   Physical Exam Vitals and nursing note reviewed.  Constitutional:      Appearance: He is well-developed.  HENT:     Head: Normocephalic and atraumatic.  Eyes:     Pupils: Pupils are equal, round, and reactive to light.  Neck:     Vascular: No JVD.  Cardiovascular:     Rate and Rhythm: Normal rate and regular rhythm.     Heart sounds: No murmur. No friction rub. No gallop.   Pulmonary:     Effort: No respiratory distress.     Breath sounds: No wheezing.  Abdominal:     General: There is no distension.     Tenderness: There is no guarding or rebound.  Musculoskeletal:        General: Normal range of motion.     Cervical back: Normal range of motion and neck supple.  Skin:    Coloration: Skin is not pale.     Findings: No rash.  Neurological:     Mental Status: He is alert and oriented to person, place, and time.     Comments: Left-sided weakness compared to right.  Gaze preference to the right but will look to the left.  Psychiatric:        Behavior: Behavior normal.     ED Results / Procedures / Treatments  Labs (all labs ordered are listed, but only abnormal results are displayed) Labs Reviewed  CBC - Abnormal; Notable for the following components:       Result Value   RBC 3.34 (*)    Hemoglobin 10.4 (*)    HCT 33.8 (*)    MCV 101.2 (*)    Platelets 679 (*)    All other components within normal limits  I-STAT CHEM 8, ED - Abnormal; Notable for the following components:   Glucose, Bld 168 (*)    Calcium, Ion 1.14 (*)    Hemoglobin 11.2 (*)    HCT 33.0 (*)    All other components within normal limits  CBG MONITORING, ED - Abnormal; Notable for the following components:   Glucose-Capillary 153 (*)    All other components within normal limits  RESPIRATORY PANEL BY RT PCR (FLU A&B, COVID)  DIFFERENTIAL  ETHANOL  PROTIME-INR  APTT  COMPREHENSIVE METABOLIC PANEL  RAPID URINE DRUG SCREEN, HOSP PERFORMED  URINALYSIS, ROUTINE W REFLEX MICROSCOPIC    EKG EKG Interpretation  Date/Time:  Monday November 19 2019 05:11:59 EST Ventricular Rate:  124 PR Interval:    QRS Duration: 109 QT Interval:  326 QTC Calculation: 469 R Axis:   20 Text Interpretation: Sinus arrhythmia Multiple premature complexes, vent & supraven LVH with secondary repolarization abnormality Probable anterior infarct, age indeterminate Minimal ST elevation, inferior leads No significant change since last tracing Confirmed by Deno Etienne [phone]) on 11/19/2019 5:16:02 AM   Radiology CT Code Stroke CTA Head W/WO contrast  Result Date: 11/19/2019 CLINICAL DATA:  Right MCA syndrome EXAM: CT ANGIOGRAPHY HEAD AND NECK CT PERFUSION BRAIN TECHNIQUE: Multidetector CT imaging of the head and neck was performed using the standard protocol during bolus administration of intravenous contrast. Multiplanar CT image reconstructions and MIPs were obtained to evaluate the vascular anatomy. Carotid stenosis measurements (when applicable) are obtained utilizing NASCET criteria, using the distal internal carotid diameter as the denominator. Multiphase CT imaging of the brain was performed following IV bolus contrast injection. Subsequent parametric perfusion maps were calculated using RAPID  software. CONTRAST:  167mL OMNIPAQUE IOHEXOL 350 MG/ML SOLN COMPARISON:  Head CT from earlier today FINDINGS: CTA NECK FINDINGS Aortic arch: Atherosclerotic plaque with 3 vessel branching. 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 The LIMA and upper venous graft are patent. Right carotid system: Bulky calcified plaque at the bifurcation without flow limiting stenosis or ulceration. No downstream beading or dissection. Left carotid system: Remarkably bulky calcified plaque at the bifurcation. ICA stenosis measures 75% on coronal reformats. No ulceration or dissection. Vertebral arteries: Proximal subclavian atherosclerosis without flow limiting stenosis. Atherosclerotic plaque on the dominant left V1 segment and V2 segment without flow limiting stenosis or beading. Faint flow in the non dominant right vertebral artery. Skeleton: Disc and facet degeneration. No acute or aggressive finding Other neck: Frothy secretions in the trachea. No acute or aggressive finding. Upper chest: Calcified granulomas in the apical lungs. Review of the MIP images confirms the above findings CTA HEAD FINDINGS Anterior circulation: Atherosclerotic plaque along both carotid siphons which is confluent. There is a severe stenosis at the supraclinoid right ICA segment with small vessel size limiting accurate measurement. No downstream branch occlusion although there is less well filled right MCA branches. The paraclinoid left ICA is moderately stenotic and likely overestimated visually due to degree of calcified plaque blooming. Stenosis is likely 50% or less. No downstream branch occlusion or beading. Atheromatous irregularity of medium size vessels. Posterior circulation: Strong left vertebral artery dominance.  There is faint thready flow in the right vertebral artery with no meaningful contribution to the basilar. There is a high-grade short segment narrowing at the left vertebrobasilar junction. Abrupt loss of flow within the distal basilar with no flow seen  in the P1 segments and throughout the majority of the right PCA territory. There is a moderate-sized left posterior communicating artery. Less intense enhancement is seen within the vertebrobasilar arteries compared to the carotids. Distal basilar occlusion is new from flow voids in 2014 brain MRI Venous sinuses: Unremarkable in the arterial phase Anatomic variants: As above Review of the MIP images confirms the above findings CT Brain Perfusion Findings: ASPECTS: 10 CBF (<30%) Volume: 40mL Perfusion (Tmax>6.0s) volume: 56mL Ischemia is seen in the right PCA territory These results were called by telephone at the time of interpretation on 11/19/2019 at 5:18 am to provider MCNEILL [facility] , who verbally acknowledged these results. IMPRESSION: 1. 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 2. Severe right supraclinoid ICA stenosis with right MCA branch underfilling, chronic appearing 3. High-grade narrowing at the left vertebrobasilar junction. No meaningful contribution to the basilar by the non dominant right vertebral artery. 4. Heavily calcified carotid bifurcations, especially on the left where ICA stenosis measures 75%. Electronically Signed   By: Monte Fantasia M.D.   On: 11/19/2019 05:24   CT Code Stroke CTA Neck W/WO contrast  Result Date: 11/19/2019 CLINICAL DATA:  Right MCA syndrome EXAM: CT ANGIOGRAPHY HEAD AND NECK CT PERFUSION BRAIN TECHNIQUE: Multidetector CT imaging of the head and neck was performed using the standard protocol during bolus administration of intravenous contrast. Multiplanar CT image reconstructions and MIPs were obtained to evaluate the vascular anatomy. Carotid stenosis measurements (when applicable) are obtained utilizing NASCET criteria, using the distal internal carotid diameter as the denominator. Multiphase CT imaging of the brain was performed following IV bolus contrast injection. Subsequent  parametric perfusion maps were calculated using RAPID software. CONTRAST:  186mL OMNIPAQUE IOHEXOL 350 MG/ML SOLN COMPARISON:  Head CT from earlier today FINDINGS: CTA NECK FINDINGS Aortic arch: Atherosclerotic plaque with 3 vessel branching. 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 The LIMA and upper venous graft are patent. Right carotid system: Bulky calcified plaque at the bifurcation without flow limiting stenosis or ulceration. No downstream beading or dissection. Left carotid system: Remarkably bulky calcified plaque at the bifurcation. ICA stenosis measures 75% on coronal reformats. No ulceration or dissection. Vertebral arteries: Proximal subclavian atherosclerosis without flow limiting stenosis. Atherosclerotic plaque on the dominant left V1 segment and V2 segment without flow limiting stenosis or beading. Faint flow in the non dominant right vertebral artery. Skeleton: Disc and facet degeneration. No acute or aggressive finding Other neck: Frothy secretions in the trachea. No acute or aggressive finding. Upper chest: Calcified granulomas in the apical lungs. Review of the MIP images confirms the above findings CTA HEAD FINDINGS Anterior circulation: Atherosclerotic plaque along both carotid siphons which is confluent. There is a severe stenosis at the supraclinoid right ICA segment with small vessel size limiting accurate measurement. No downstream branch occlusion although there is less well filled right MCA branches. The paraclinoid left ICA is moderately stenotic and likely overestimated visually due to degree of calcified plaque blooming. Stenosis is likely 50% or less. No downstream branch occlusion or beading. Atheromatous irregularity of medium size vessels. Posterior circulation: Strong left vertebral artery dominance. There is faint thready flow in the right vertebral artery with no meaningful contribution to  the basilar. There is a high-grade short segment narrowing at the left vertebrobasilar junction. Abrupt loss  of flow within the distal basilar with no flow seen in the P1 segments and throughout the majority of the right PCA territory. There is a moderate-sized left posterior communicating artery. Less intense enhancement is seen within the vertebrobasilar arteries compared to the carotids. Distal basilar occlusion is new from flow voids in 2014 brain MRI Venous sinuses: Unremarkable in the arterial phase Anatomic variants: As above Review of the MIP images confirms the above findings CT Brain Perfusion Findings: ASPECTS: 10 CBF (<30%) Volume: 61mL Perfusion (Tmax>6.0s) volume: 76mL Ischemia is seen in the right PCA territory These results were called by telephone at the time of interpretation on 11/19/2019 at 5:18 am to provider MCNEILL [facility] , who verbally acknowledged these results. IMPRESSION: 1. 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 2. Severe right supraclinoid ICA stenosis with right MCA branch underfilling, chronic appearing 3. High-grade narrowing at the left vertebrobasilar junction. No meaningful contribution to the basilar by the non dominant right vertebral artery. 4. Heavily calcified carotid bifurcations, especially on the left where ICA stenosis measures 75%. Electronically Signed   By: Monte Fantasia M.D.   On: 11/19/2019 05:24   CT Code Stroke Cerebral Perfusion with contrast  Result Date: 11/19/2019 CLINICAL DATA:  Right MCA syndrome EXAM: CT ANGIOGRAPHY HEAD AND NECK CT PERFUSION BRAIN TECHNIQUE: Multidetector CT imaging of the head and neck was performed using the standard protocol during bolus administration of intravenous contrast. Multiplanar CT image reconstructions and MIPs were obtained to evaluate the vascular anatomy. Carotid stenosis measurements (when applicable) are obtained utilizing NASCET criteria, using the distal internal carotid diameter as the denominator. Multiphase CT imaging of the brain was  performed following IV bolus contrast injection. Subsequent parametric perfusion maps were calculated using RAPID software. CONTRAST:  15mL OMNIPAQUE IOHEXOL 350 MG/ML SOLN COMPARISON:  Head CT from earlier today FINDINGS: CTA NECK FINDINGS Aortic arch: Atherosclerotic plaque with 3 vessel branching. 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 The LIMA and upper venous graft are patent. Right carotid system: Bulky calcified plaque at the bifurcation without flow limiting stenosis or ulceration. No downstream beading or dissection. Left carotid system: Remarkably bulky calcified plaque at the bifurcation. ICA stenosis measures 75% on coronal reformats. No ulceration or dissection. Vertebral arteries: Proximal subclavian atherosclerosis without flow limiting stenosis. Atherosclerotic plaque on the dominant left V1 segment and V2 segment without flow limiting stenosis or beading. Faint flow in the non dominant right vertebral artery. Skeleton: Disc and facet degeneration. No acute or aggressive finding Other neck: Frothy secretions in the trachea. No acute or aggressive finding. Upper chest: Calcified granulomas in the apical lungs. Review of the MIP images confirms the above findings CTA HEAD FINDINGS Anterior circulation: Atherosclerotic plaque along both carotid siphons which is confluent. There is a severe stenosis at the supraclinoid right ICA segment with small vessel size limiting accurate measurement. No downstream branch occlusion although there is less well filled right MCA branches. The paraclinoid left ICA is moderately stenotic and likely overestimated visually due to degree of calcified plaque blooming. Stenosis is likely 50% or less. No downstream branch occlusion or beading. Atheromatous irregularity of medium size vessels. Posterior circulation: Strong left vertebral artery dominance. There is faint thready flow in the right vertebral artery with no meaningful contribution to the basilar. There is a high-grade short segment  narrowing at the left vertebrobasilar junction.  Abrupt loss of flow within the distal basilar with no flow seen in the P1 segments and throughout the majority of the right PCA territory. There is a moderate-sized left posterior communicating artery. Less intense enhancement is seen within the vertebrobasilar arteries compared to the carotids. Distal basilar occlusion is new from flow voids in 2014 brain MRI Venous sinuses: Unremarkable in the arterial phase Anatomic variants: As above Review of the MIP images confirms the above findings CT Brain Perfusion Findings: ASPECTS: 10 CBF (<30%) Volume: 37mL Perfusion (Tmax>6.0s) volume: 22mL Ischemia is seen in the right PCA territory These results were called by telephone at the time of interpretation on 11/19/2019 at 5:18 am to provider MCNEILL [facility] , who verbally acknowledged these results. IMPRESSION: 1. 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 2. Severe right supraclinoid ICA stenosis with right MCA branch underfilling, chronic appearing 3. High-grade narrowing at the left vertebrobasilar junction. No meaningful contribution to the basilar by the non dominant right vertebral artery. 4. Heavily calcified carotid bifurcations, especially on the left where ICA stenosis measures 75%. Electronically Signed   By: Monte Fantasia M.D.   On: 11/19/2019 05:24   CT HEAD CODE STROKE WO CONTRAST  Result Date: 11/19/2019 CLINICAL DATA:  Code stroke.  Ataxia with stroke suspected EXAM: CT HEAD WITHOUT CONTRAST TECHNIQUE: Contiguous axial images were obtained from the base of the skull through the vertex without intravenous contrast. COMPARISON:  Brain MRI 07/11/2013 FINDINGS: Brain: No evidence of acute infarction, hemorrhage, hydrocephalus, extra-axial collection or mass lesion/mass effect. Generalized atrophy. Chronic ischemic injury about the left basal ganglia in the lateral lenticulostriate  distribution. Small vessel ischemic gliosis in the white matter is mild. Cerebral volume loss in keeping with age. Vascular: Atherosclerotic calcification that is prominent. No hyperdense vessel. Skull: Normal. Negative for fracture or focal lesion. Sinuses/Orbits: Bilateral cataract resection. Other: These results were communicated to Dr. Leonel Ramsay at 4:54 amon 12/14/2020by text page via the [facility] messaging system. ASPECTS [facility] Stroke Program Early CT Score) Not scored with this history IMPRESSION: Aging brain without acute or reversible finding. Electronically Signed   By: Monte Fantasia M.D.   On: 11/19/2019 04:57    Procedures Procedures (including critical care time)  Medications Ordered in ED Medications  iohexol (OMNIPAQUE) 350 MG/ML injection 115 mL (115 mLs Intravenous Contrast Given 11/19/19 0510)    ED Course  I have reviewed the triage vital signs and the nursing notes.  Pertinent labs & imaging results that were available during my care of the patient were reviewed by me and considered in my medical decision making (see chart for details).    MDM Rules/Calculators/A&P     CHA2DS2/VAS Stroke Risk Points      N/A >= 2 Points: High Risk  1 - 1.99 Points: Medium Risk  0 Points: Low Risk    A final score could not be computed because of missing components.: Last  Change: N/A     This score determines the patient's risk of having a stroke if the  patient has atrial fibrillation.      This score is not applicable to this patient. Components are not  calculated.                   85 yo M with a chief complaints of left-sided neglect and weakness.  Patient was a code LVO taken urgently back to CT. 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  I had performed my second assessment at that time and he had had some improvements of the left side.  Was now moving his left arm spontaneously and was more consistently able to look to the left.  Plan for  angiography before intervention.  CRITICAL CARE Performed by: Cecilio Asper   Total critical care time: 35 minutes  Critical care time was exclusive of separately billable procedures and treating other patients.  Critical care was necessary to treat or prevent imminent or life-threatening deterioration.  Critical care was time spent personally by me on the following activities: development of treatment plan with patient and/or surrogate as well as nursing, discussions with consultants, evaluation of patient's response to treatment, examination of patient, obtaining history from patient or surrogate, ordering and performing treatments and interventions, ordering and review of laboratory studies, ordering and review of radiographic studies, pulse oximetry and re-evaluation of patient's condition.   The patients results and plan were reviewed and discussed.   Any x-rays performed were independently reviewed by myself.   Differential diagnosis were considered with the presenting HPI.  Medications  iohexol (OMNIPAQUE) 350 MG/ML injection 115 mL (115 mLs Intravenous Contrast Given 11/19/19 0510)    Vitals:   11/19/19 0512 11/19/19 0515 11/19/19 0530  BP: 135/76 (!) 130/91 124/90  Pulse: (!) 108 (!) 107 (!) 101  Resp: (!) 21 14 (!) 23  Temp: (!) 97.4 F (36.3 C)    TempSrc: Oral    SpO2: 92% 98% 99%    Final diagnoses:  Stroke [facility])    Admission/ observation were discussed with the admitting physician, patient and/or family and they are comfortable with the plan.   Final Clinical Impression(s) / ED Diagnoses Final diagnoses:  None    Rx / DC Orders ED Discharge Orders    None       Deno Etienne, DO 11/19/19 [phone]

## 2019-11-19 NOTE — ED Triage Notes (Signed)
Pt came in [facility] Code Stroke. LKW was 2000 on 11/18/2019.  Pt fell out of the bed and daughter came to check on him around 0230. At this time she noticed L-sided weakness, Slurred Speech. LVO=5. [id number]. BP:117/80

## 2019-11-19 NOTE — Progress Notes (Signed)
PT Cancellation Note  Patient Details Name: Kenneth [name] MRN: [id number] DOB: 07/08/1928   Cancelled Treatment:    Reason Eval/Treat Not Completed: Medical issues which prohibited therapy - Pt s/p R groin sheath removal, on bedrest until 1800 this evening. Will see pt tomorrow when medically ready.  [place] Pager [phone]  Office [phone]    Roxine Caddy D Elonda Husky 11/19/2019, 9:53 AM

## 2019-11-19 NOTE — Progress Notes (Signed)
Patient ID: Kenneth [name], male   DOB: May 09, 1928, 85 y.o.   MRN: [id number] INR  89Y RT H M LSW 800pm. New onset Lt sided weakness ,Lt sided visual field deficit,lt sided hemiparesis and neglect.. CT brain No ICH ASPECTS 10. 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 CTP perfusion scans hypoperfusion RT PCA distribution. MRSS 0. Endovascular revascularization D/W daughter. Procedure,reasons and risks D/W daughter. Risks of ich OF 10 %,WORSENING NEURO DEFICIT DEATH AND INABILITY TO REVASCULARIZE REVIEWED.dAUGHTER EXPRESSED UNDERSTANDING AND PROVIDED CONSENT TO PROCEED. S.Ignatius Kloos md

## 2019-11-20 ENCOUNTER — Other Ambulatory Visit: Payer: Self-pay

## 2019-11-20 ENCOUNTER — Inpatient Hospital Stay (HOSPITAL_COMMUNITY): Payer: Medicare Other

## 2019-11-20 DIAGNOSIS — E7800 Pure hypercholesterolemia, unspecified: Secondary | ICD-10-CM

## 2019-11-20 DIAGNOSIS — I2111 ST elevation (STEMI) myocardial infarction involving right coronary artery: Secondary | ICD-10-CM

## 2019-11-20 DIAGNOSIS — I651 Occlusion and stenosis of basilar artery: Secondary | ICD-10-CM

## 2019-11-20 DIAGNOSIS — I6340 Cerebral infarction due to embolism of unspecified cerebral artery: Secondary | ICD-10-CM

## 2019-11-20 DIAGNOSIS — R064 Hyperventilation: Secondary | ICD-10-CM

## 2019-11-20 DIAGNOSIS — Z951 Presence of aortocoronary bypass graft: Secondary | ICD-10-CM

## 2019-11-20 DIAGNOSIS — I639 Cerebral infarction, unspecified: Secondary | ICD-10-CM

## 2019-11-20 LAB — BASIC METABOLIC PANEL
Anion gap: 8 (ref 5–15)
BUN: 16 mg/dL (ref 8–23)
CO2: 21 mmol/L — ABNORMAL LOW (ref 22–32)
Calcium: 8.4 mg/dL — ABNORMAL LOW (ref 8.9–10.3)
Chloride: 111 mmol/L (ref 98–111)
Creatinine, Ser: 0.84 mg/dL (ref 0.61–1.24)
GFR calc Af Amer: >60 mL/min (ref >60)
GFR calc non Af Amer: >60 mL/min (ref >60)
Glucose, Bld: 178 mg/dL — ABNORMAL HIGH (ref 70–99)
Potassium: 4.1 mmol/L (ref 3.5–5.1)
Sodium: 140 mmol/L (ref 135–145)

## 2019-11-20 LAB — POCT I-STAT 7, (LYTES, BLD GAS, ICA,H+H)
Acid-base deficit: 3 mmol/L — ABNORMAL HIGH (ref 0.0–2.0)
Bicarbonate: 23.2 mmol/L (ref 20.0–28.0)
Calcium, Ion: 1.22 mmol/L (ref 1.15–1.40)
HCT: 31 % — ABNORMAL LOW (ref 39.0–52.0)
Hemoglobin: 10.5 g/dL — ABNORMAL LOW (ref 13.0–17.0)
O2 Saturation: 100 %
Potassium: 3.9 mmol/L (ref 3.5–5.1)
Sodium: 141 mmol/L (ref 135–145)
TCO2: 25 mmol/L (ref 22–32)
pCO2 arterial: 46.5 mmHg (ref 32.0–48.0)
pH, Arterial: 7.306 — ABNORMAL LOW (ref 7.350–7.450)
pO2, Arterial: 346 mmHg — ABNORMAL HIGH (ref 83.0–108.0)

## 2019-11-20 LAB — CBC WITH DIFFERENTIAL/PLATELET
Abs Immature Granulocytes: 0.03 10*3/uL (ref 0.00–0.07)
Basophils Absolute: 0 10*3/uL (ref 0.0–0.1)
Basophils Relative: 0 %
Eosinophils Absolute: 0 10*3/uL (ref 0.0–0.5)
Eosinophils Relative: 0 %
HCT: 24.9 % — ABNORMAL LOW (ref 39.0–52.0)
Hemoglobin: 7.7 g/dL — ABNORMAL LOW (ref 13.0–17.0)
Immature Granulocytes: 0 %
Lymphocytes Relative: 6 %
Lymphs Abs: 0.5 10*3/uL — ABNORMAL LOW (ref 0.7–4.0)
MCH: 30.9 pg (ref 26.0–34.0)
MCHC: 30.9 g/dL (ref 30.0–36.0)
MCV: 100 fL (ref 80.0–100.0)
Monocytes Absolute: 0.4 10*3/uL (ref 0.1–1.0)
Monocytes Relative: 5 %
Neutro Abs: 7.3 10*3/uL (ref 1.7–7.7)
Neutrophils Relative %: 89 %
Platelets: 518 10*3/uL — ABNORMAL HIGH (ref 150–400)
RBC: 2.49 MIL/uL — ABNORMAL LOW (ref 4.22–5.81)
RDW: 13.9 % (ref 11.5–15.5)
WBC: 8.3 10*3/uL (ref 4.0–10.5)
nRBC: 0 % (ref 0.0–0.2)

## 2019-11-20 LAB — GLUCOSE, CAPILLARY
Glucose-Capillary: 140 mg/dL — ABNORMAL HIGH (ref 70–99)
Glucose-Capillary: 143 mg/dL — ABNORMAL HIGH (ref 70–99)
Glucose-Capillary: 143 mg/dL — ABNORMAL HIGH (ref 70–99)
Glucose-Capillary: 160 mg/dL — ABNORMAL HIGH (ref 70–99)
Glucose-Capillary: 166 mg/dL — ABNORMAL HIGH (ref 70–99)
Glucose-Capillary: 330 mg/dL — ABNORMAL HIGH (ref 70–99)

## 2019-11-20 LAB — LIPID PANEL
Cholesterol: 81 mg/dL (ref 0–200)
HDL: 32 mg/dL — ABNORMAL LOW (ref >40)
LDL Cholesterol: 36 mg/dL (ref 0–99)
Total CHOL/HDL Ratio: 2.5 RATIO
Triglycerides: 67 mg/dL (ref <150)
VLDL: 13 mg/dL (ref 0–40)

## 2019-11-20 LAB — HEMOGLOBIN AND HEMATOCRIT, BLOOD
HCT: 28.7 % — ABNORMAL LOW (ref 39.0–52.0)
Hemoglobin: 8.9 g/dL — ABNORMAL LOW (ref 13.0–17.0)

## 2019-11-20 LAB — HEMOGLOBIN A1C
Hgb A1c MFr Bld: 6.9 % — ABNORMAL HIGH (ref 4.8–5.6)
Mean Plasma Glucose: 151.33 mg/dL

## 2019-11-20 LAB — OCCULT BLOOD GASTRIC / DUODENUM (SPECIMEN CUP): Occult Blood, Gastric: NEGATIVE

## 2019-11-20 LAB — TROPONIN I (HIGH SENSITIVITY): Troponin I (High Sensitivity): 903 ng/L (ref <18)

## 2019-11-20 MED ORDER — CLOPIDOGREL BISULFATE 75 MG PO TABS
75.0000 mg | ORAL_TABLET | Freq: Every day | ORAL | Status: DC
  Administered 2019-11-21 – 2019-11-23 (×3): 75 mg via ORAL
  Filled 2019-11-20 (×4): qty 1

## 2019-11-20 MED ORDER — CLOPIDOGREL BISULFATE 75 MG PO TABS
300.0000 mg | ORAL_TABLET | Freq: Once | ORAL | Status: AC
  Administered 2019-11-20: 300 mg via ORAL
  Filled 2019-11-20: qty 4

## 2019-11-20 NOTE — Consult Note (Addendum)
The patient has been seen in conjunction with Reino Bellis, NP. All aspects of care have been considered and discussed. The patient has been personally interviewed, examined, and all clinical data has been reviewed.   Consulted because of concern for possible ST elevation myocardial infarction.  The clinical scenario is an 85 year old gentleman status post new right brain CVA that was treated with extraction thrombectomy with improvement in symptoms within the past 24 hours.  Had a STEMI 8 days ago treated with PCI.  Noted to have dyspnea/hyperpnea and concern that he may be having a primary cardiac event.  EKG does not show any acute ST-T wave change   Exam does not reveal convincing volume overload.  Impression: Suspect that dyspnea/hyperventilation is related to Brilinta therapy.  Plan: Chest x-ray (no evidence of congestion), BNP, and convert Brilinta to Plavix 300 mg as soon as possible then 75 mg daily thereafter.   Cardiology Consultation:   Patient ID: JERIC PAWELSKI MRN: [id number]; DOB: 17-Sep-1928  Admit date: 11/19/2019 Date of Consult: 11/20/2019  Primary Care Provider: Lavone Orn, MD Primary Cardiologist: Sinclair Grooms, MD  Primary Electrophysiologist:  None    Patient Profile:   RIAL SABIR is a 85 y.o. male with a hx of CAD status post remote CABG with recent stenting of SVG to PDA, hypothyroidism, hypertension, hyperlipidemia, diabetes and GI bleed who is being seen today for the evaluation of CHF, blood pressure management at the request of Dr. Erlinda Hong.  History of Present Illness:   Mr. Longmore is an 85 year old male with past medical history noted above.  He was admitted 11/27-11/29 2020 with diverticulitis and large hiatal hernia.  Had left lower quadrant pain, anorexia and vomiting.  Prior history of colectomy 2 to 3 years prior.  He was treated conservatively with antibiotics.  Plan was for surgical consultation versus GI referral as an outpatient  if recurrence.  He had had intermittent nausea and abdominal pain since discharge.  He presented on 11/12/2019 with severe fatigue, nausea and intermittent shortness of breath.  Symptoms had worsened throughout the day of admission and his daughter called EMS.  Was found to have an inferior STEMI.  He was brought directly to the Cath Lab and underwent PCI/DES x1 to SVG to PDA which did require extensive thrombectomy.  He was placed on Brilinta post cath, and decision made to stop aspirin given his increased risk of GI bleeding.  EF was noted at 20 to 25%.  He tolerated the addition of metoprolol and losartan.  Did note to have anemia during admission with a decline decline in his hemoglobin from 11.2--> 9.2.  GI was consulted but recommended conservative therapy given he did not have any active bleeding on CT abdomen/pelvis.  Recommendations were to continue with Protonix 40 mg twice daily for 4 weeks then transition to 40 mg daily.  He presented to the ED on 11/19/2019 reporting he had been in his usual state of health the night prior.  That morning called his daughter and stated he was having trouble getting out of bed and needed to go to the bathroom and could not move his left side.  On EMS arrival he had fallen out of his bed.  He was brought to the ED for further evaluation, and code stroke called in the field.    He was evaluated by neurology and underwent head CT which showed distal BA occlusion.  He was sent to interventional radiology for revascularization and thrombectomy.  Follow-up  MRI/MRI showed improved intracranial flow.  Labs during this admission are notable for anemia, hemoglobin 10.4>> 7.7.  He was initially started on aspirin, but this was stopped as the patient stated he was instructed not to be on aspirin by his GI physician.  In talking with his daughter she reports he seemed to be more short of breath over the weekend. No really associated with activity. Also has been nauseated. Tried  to get Rx for Zofran but was having trouble with the pharmacy.   Heart Pathway Score:     Past Medical History:  Diagnosis Date  . Anemia    on iron  . Anxiety   . Arthritis   . Basal cell carcinoma    left ear  . CAD (coronary artery disease)    a. CAD s/p CABG 1993.  [name] Chronic cough   . Chronic rhinitis   . Complication of anesthesia   . Diabetes ([place])   . Diverticulosis of colon   . GERD (gastroesophageal reflux disease)   . Glaucoma   . H/O hiatal hernia   . Hernia    left side  . History of gout   . History of kidney stones   . HTN (hypertension)   . Hyperlipidemia `  . Melanoma of skin, site unspecified   . PONV (postoperative nausea and vomiting)   . [facility] spotted fever 2-3 yrs ago  . Thyroid disease     Past Surgical History:  Procedure Laterality Date  . COLONOSCOPY WITH PROPOFOL N/A 11/27/2013   Procedure: COLONOSCOPY WITH PROPOFOL;  Surgeon: Garlan Fair, MD;  Location: [facility];  Service: Endoscopy;  Laterality: N/A;  . COLOSTOMY TAKEDOWN N/A 03/16/2016   Procedure: COLOSTOMY TAKEDOWN;  Surgeon: Excell Seltzer, MD;  Location: [facility];  Service: General;  Laterality: N/A;  . CORONARY ARTERY BYPASS GRAFT  1990's   x 3  . CORONARY/GRAFT ACUTE MI REVASCULARIZATION N/A 11/12/2019   Procedure: CORONARY/GRAFT ACUTE MI REVASCULARIZATION;  Surgeon: Troy Sine, MD;  Location: [facility];  Service: Cardiovascular;  Laterality: N/A;  . HERNIA REPAIR  1970's,15   lft ing hernia  . INGUINAL HERNIA REPAIR Left 06/28/2014   Procedure: Left  INGUINAL HERNIA REPAIR ;  Surgeon: Merrie Roof, MD;  Location: [facility];  Service: General;  Laterality: Left;  . INSERTION OF MESH Left 06/28/2014   Procedure: INSERTION OF MESH;  Surgeon: Merrie Roof, MD;  Location: [facility];  Service: General;  Laterality: Left;  . INSERTION OF MESH N/A 10/11/2014  . IR CT HEAD LTD  11/19/2019  . IR PERCUTANEOUS ART THROMBECTOMY/INFUSION INTRACRANIAL INC DIAG ANGIO   11/19/2019  . LAPAROSCOPY  10/11/2014  . LAPAROTOMY N/A 09/11/2015   Procedure: EXPLORATORY LAPAROTOMY;  Surgeon: Excell Seltzer, MD;  Location: [facility];  Service: General;  Laterality: N/A;  . MASS EXCISION Left 03/21/2017   Procedure: EXCISION BASAL CELL CARCINOMA LEFT EAR;  Surgeon: Leta Baptist, MD;  Location: [place];  Service: ENT;  Laterality: Left;  . PARTIAL COLECTOMY N/A 09/11/2015   Procedure: SIGMOID COLECTOMY AND COLOSTOMY;  Surgeon: Excell Seltzer, MD;  Location: [facility];  Service: General;  Laterality: N/A;  . RADIOLOGY WITH ANESTHESIA N/A 11/19/2019   Procedure: RADIOLOGY WITH ANESTHESIA;  Surgeon: Luanne Bras, MD;  Location: [place];  Service: Radiology;  Laterality: N/A;  . right leg fracture age 37    . SKIN FULL THICKNESS GRAFT Left 03/21/2017   Procedure: FULL THICKNESS SKIN GRAFT TO  LEFT EAR;  Surgeon: Leta Baptist, MD;  Location: [place];  Service: ENT;  Laterality: Left;  . TONSILLECTOMY  age 39 or 49  . VENTRAL HERNIA REPAIR  10/11/2014   Procedure: HERNIA REPAIR VENTRAL ADULT;  Surgeon: Autumn Messing III, MD;  Location: [place];  Service: General;;     Home Medications:  Prior to Admission medications   Medication Sig Start Date End Date Taking? Authorizing Provider  acetaminophen (TYLENOL) 325 MG tablet Take 325 mg by mouth every 6 (six) hours as needed for mild pain.     [provider]  Artificial Tear Solution (SOOTHE XP OP) Apply 1 drop to eye as needed (dry eyes).     [provider]  Cyanocobalamin (VITAMIN B-12 PO) Take 1 tablet by mouth daily.    [provider]  ferrous sulfate 325 (65 FE) MG tablet Take 325 mg by mouth daily with breakfast.    [provider]  isosorbide mononitrate (IMDUR) 60 MG 24 hr tablet Take 60 mg by mouth every evening.     [provider]  latanoprost (XALATAN) 0.005 % ophthalmic solution Place 1 drop into both eyes at bedtime.    [provider]    levothyroxine (SYNTHROID, LEVOTHROID) 75 MCG tablet Take 50 mcg by mouth daily before breakfast.     [provider]  losartan (COZAAR) 25 MG tablet Take 0.5 tablets (12.5 mg total) by mouth daily. 11/16/19   Cheryln Manly, NP  metFORMIN (GLUCOPHAGE) 1000 MG tablet Take 500 mg by mouth daily with breakfast.     [provider]  metoprolol succinate (TOPROL-XL) 50 MG 24 hr tablet TAKE ONE-HALF (1/2) TABLET BY MOUTH DAILY WITH OR AFTER A MEAL Patient taking differently: Take 25 mg by mouth daily. WITH OR AFTER A MEAL 10/29/19   Belva Crome, MD  Multiple Vitamin (MULTIVITAMIN WITH MINERALS) TABS tablet Take 1 tablet by mouth daily.    [provider]  nitroGLYCERIN (NITROSTAT) 0.4 MG SL tablet Place 1 tablet (0.4 mg total) under the tongue every 5 (five) minutes as needed. 11/16/19   Cheryln Manly, NP  ondansetron (ZOFRAN) 4 MG tablet Take 1 tablet (4 mg total) by mouth every 6 (six) hours as needed for nausea. 11/04/19   Alma Friendly, MD  pantoprazole (PROTONIX) 40 MG tablet Take 1 tablet (40 mg total) by mouth 2 (two) times daily for 30 days, THEN 1 tablet (40 mg total) daily. 11/16/19 01/15/20  Cheryln Manly, NP  potassium chloride (K-DUR,KLOR-CON) 10 MEQ tablet Take 10 mEq by mouth every Monday, Wednesday, and Friday.  06/27/18   [provider]  rosuvastatin (CRESTOR) 20 MG tablet Take 1 tablet (20 mg total) by mouth daily at 6 PM. 11/16/19   Cheryln Manly, NP  ticagrelor (BRILINTA) 90 MG TABS tablet Take 1 tablet (90 mg total) by mouth 2 (two) times daily. 11/16/19   Cheryln Manly, NP    Inpatient Medications: Scheduled Meds: .  stroke: mapping our early stages of recovery book   Does not apply Once  . Chlorhexidine Gluconate Cloth  6 each Topical Q0600  . insulin aspart  0-15 Units Subcutaneous Q4H  . isosorbide mononitrate  60 mg Oral QPM  . latanoprost  1 drop Both Eyes QHS  . levothyroxine  50 mcg Oral Q0600  .  rosuvastatin  20 mg Oral q1800  . senna-docusate  1 tablet Oral Once  . ticagrelor  90 mg Oral BID  Continuous Infusions:  PRN Meds: acetaminophen **OR** acetaminophen (TYLENOL) oral liquid 160 mg/5 mL **OR** acetaminophen  Allergies:    Allergies  Allergen Reactions  . Lasix [Furosemide] Other (See Comments)    aches  . Spironolactone Other (See Comments)    Red, puffy, eyes, almost hospitalized  . Aspirin Other (See Comments)    Bleeding.    . Lipitor [Atorvastatin] Other (See Comments)    tired  . Lopressor [Metoprolol Tartrate] Other (See Comments)    Sleepy all the time 11.27.2020 Patient is currently taking Lopressor.  [name] Penicillins Hives and Rash    Has patient had a PCN reaction causing immediate rash, facial/tongue/throat swelling, SOB or lightheadedness with hypotension: No Has patient had a PCN reaction causing severe rash involving mucus membranes or skin necrosis: No Has patient had a PCN reaction that required hospitalization No Has patient had a PCN reaction occurring within the last 10 years: Yes If all of the above answers are "NO", then may proceed with Cephalosporin use.     Social History:   Social History   Socioeconomic History  . Marital status: Married    Spouse name: Not on file  . Number of children: Not on file  . Years of education: Not on file  . Highest education level: Not on file  Occupational History  . Not on file  Tobacco Use  . Smoking status: Former Smoker    Types: Cigars  . Smokeless tobacco: Never Used  Substance and Sexual Activity  . Alcohol use: Yes    Comment: RARE  . Drug use: No    Comment: occ cigars  . Sexual activity: Not on file  Other Topics Concern  . Not on file  Social History Narrative  . Not on file   Social Determinants of Health   Financial Resource Strain:   . Difficulty of Paying Living Expenses: Not on file  Food Insecurity:   . Worried About Charity fundraiser in the Last Year: Not on file    . Ran Out of Food in the Last Year: Not on file  Transportation Needs:   . Lack of Transportation (Medical): Not on file  . Lack of Transportation (Non-Medical): Not on file  Physical Activity:   . Days of Exercise per Week: Not on file  . Minutes of Exercise per Session: Not on file  Stress:   . Feeling of Stress : Not on file  Social Connections:   . Frequency of Communication with Friends and Family: Not on file  . Frequency of Social Gatherings with Friends and Family: Not on file  . Attends Religious Services: Not on file  . Active Member of Clubs or Organizations: Not on file  . Attends Archivist Meetings: Not on file  . Marital Status: Not on file  Intimate Partner Violence:   . Fear of Current or Ex-Partner: Not on file  . Emotionally Abused: Not on file  . Physically Abused: Not on file  . Sexually Abused: Not on file    Family History:    Family History  Problem Relation Age of Onset  . CAD Father   . Hyperlipidemia Father   . Cancer Mother      ROS:  Please see the history of present illness.   All other ROS reviewed and negative.     Physical Exam/Data:   Vitals:   11/20/19 0900 11/20/19 1000 11/20/19 1100 11/20/19 1200  BP: 107/74 100/63    Pulse: 73 89 91  94  Resp: (!) 21 (!) 28 (!) 23 (!) 23  Temp:    98.4 F (36.9 C)  TempSrc:    Oral  SpO2: 100% 100% 99% 100%  Weight:      Height:        Intake/Output Summary (Last 24 hours) at 11/20/2019 1349 Last data filed at 11/20/2019 1000 Gross per 24 hour  Intake 949.1 ml  Output 775 ml  Net 174.1 ml   Last 3 Weights 11/19/2019 11/16/2019 11/15/2019  Weight (lbs) 149 lb 14.6 oz 148 lb 1.9 oz 151 lb 12.8 oz  Weight (kg) 68 kg 67.187 kg 68.856 kg     Body mass index is 21.51 kg/m.  General:  Frail older WM, in no acute distress currently HEENT: normal Lymph: no adenopathy Neck: do not appreciate much JVD Endocrine:  No thryomegaly Vascular: No carotid bruits; FA pulses 2+  bilaterally without bruits  Cardiac:  normal S1, S2; RRR; no murmur  Lungs:  clear to auscultation bilaterally, no wheezing, rhonchi or rales  Abd: soft, nontender, no hepatomegaly  Ext: no edema Musculoskeletal:  No deformities, BUE and BLE strength normal and equal Skin: warm and dry  Neuro:  CNs 2-12 intact, no focal abnormalities noted Psych:  Normal affect   EKG:  The EKG was personally reviewed and demonstrates:  ST with continued ST elevation in inferolateral leads. Similar to prior EKG from last admission for STEMI.  Relevant CV Studies:  TTE: 11/19/19  IMPRESSIONS    1. Left ventricular ejection fraction, by visual estimation, is 20 to 25%. The left ventricle has normal function. There is no left ventricular hypertrophy.  2. Left ventricular diastolic parameters are indeterminate.  3. The left ventricle demonstrates global hypokinesis.  4. COmpared to previous echo on 11/12/19, LVEF is unchanged.  5. Global right ventricle mild to moderately reduced..The right ventricular size is normal. No increase in right ventricular wall thickness.  6. Left atrial size was moderately dilated.  7. Right atrial size was normal.  8. The mitral valve is grossly normal. Moderate mitral valve regurgitation.  9. The tricuspid valve is normal in structure. Tricuspid valve regurgitation is trivial. 10. The aortic valve is tricuspid. Aortic valve regurgitation is not visualized. Mild aortic valve stenosis. 11. The pulmonic valve was normal in structure. Pulmonic valve regurgitation is not visualized. 12. The aortic root was not well visualized. 13. The inferior vena cava is normal in size with greater than 50% respiratory variability, suggesting right atrial pressure of 3 mmHg.  Laboratory Data:  High Sensitivity Troponin:   Recent Labs  Lab 11/12/19 1045 11/12/19 1327 11/12/19 1448  TROPONINIHS 8,449* 10,700* 9,411*     Chemistry Recent Labs  Lab 11/16/19 0143 11/19/19 0432  11/19/19 0512 11/20/19 0500  NA 138 141 141 140  K 3.9 4.1 4.2 4.1  CL 107 108 107 111  CO2 22 20*  --  21*  GLUCOSE 134* 177* 168* 178*  BUN 10 13 16 16   CREATININE 0.78 0.96 0.80 0.84  CALCIUM 8.4* 8.8*  --  8.4*  GFRNONAA >60 >60  --  >60  GFRAA >60 >60  --  >60  ANIONGAP 9 13  --  8    Recent Labs  Lab 11/19/19 0432  PROT 6.5  ALBUMIN 2.7*  AST 22  ALT 14  ALKPHOS 66  BILITOT 0.4   Hematology Recent Labs  Lab 11/15/19 2326 11/19/19 0432 11/19/19 0512 11/20/19 0500  WBC 10.3 8.2  --  8.3  RBC  2.82* 3.34*  --  2.49*  HGB 8.8* 10.4* 11.2* 7.7*  HCT 28.2* 33.8* 33.0* 24.9*  MCV 100.0 101.2*  --  100.0  MCH 31.2 31.1  --  30.9  MCHC 31.2 30.8  --  30.9  RDW 13.4 13.8  --  13.9  PLT 503* 679*  --  518*   BNPNo results for input(s): BNP, PROBNP in the last 168 hours.  DDimer No results for input(s): DDIMER in the last 168 hours.   Radiology/Studies:  CT Code Stroke CTA Head W/WO contrast  Result Date: 11/19/2019 CLINICAL DATA:  Right MCA syndrome EXAM: CT ANGIOGRAPHY HEAD AND NECK CT PERFUSION BRAIN TECHNIQUE: Multidetector CT imaging of the head and neck was performed using the standard protocol during bolus administration of intravenous contrast. Multiplanar CT image reconstructions and MIPs were obtained to evaluate the vascular anatomy. Carotid stenosis measurements (when applicable) are obtained utilizing NASCET criteria, using the distal internal carotid diameter as the denominator. Multiphase CT imaging of the brain was performed following IV bolus contrast injection. Subsequent parametric perfusion maps were calculated using RAPID software. CONTRAST:  151mL OMNIPAQUE IOHEXOL 350 MG/ML SOLN COMPARISON:  Head CT from earlier today FINDINGS: CTA NECK FINDINGS Aortic arch: Atherosclerotic plaque with 3 vessel branching. 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 The LIMA and upper venous graft are patent. Right carotid system: Bulky calcified plaque at the bifurcation without flow  limiting stenosis or ulceration. No downstream beading or dissection. Left carotid system: Remarkably bulky calcified plaque at the bifurcation. ICA stenosis measures 75% on coronal reformats. No ulceration or dissection. Vertebral arteries: Proximal subclavian atherosclerosis without flow limiting stenosis. Atherosclerotic plaque on the dominant left V1 segment and V2 segment without flow limiting stenosis or beading. Faint flow in the non dominant right vertebral artery. Skeleton: Disc and facet degeneration. No acute or aggressive finding Other neck: Frothy secretions in the trachea. No acute or aggressive finding. Upper chest: Calcified granulomas in the apical lungs. Review of the MIP images confirms the above findings CTA HEAD FINDINGS Anterior circulation: Atherosclerotic plaque along both carotid siphons which is confluent. There is a severe stenosis at the supraclinoid right ICA segment with small vessel size limiting accurate measurement. No downstream branch occlusion although there is less well filled right MCA branches. The paraclinoid left ICA is moderately stenotic and likely overestimated visually due to degree of calcified plaque blooming. Stenosis is likely 50% or less. No downstream branch occlusion or beading. Atheromatous irregularity of medium size vessels. Posterior circulation: Strong left vertebral artery dominance. There is faint thready flow in the right vertebral artery with no meaningful contribution to the basilar. There is a high-grade short segment narrowing at the left vertebrobasilar junction. Abrupt loss of flow within the distal basilar with no flow seen in the P1 segments and throughout the majority of the right PCA territory. There is a moderate-sized left posterior communicating artery. Less intense enhancement is seen within the vertebrobasilar arteries compared to the carotids. Distal basilar occlusion is new from flow voids in 2014 brain MRI Venous sinuses: Unremarkable in  the arterial phase Anatomic variants: As above Review of the MIP images confirms the above findings CT Brain Perfusion Findings: ASPECTS: 10 CBF (<30%) Volume: 57mL Perfusion (Tmax>6.0s) volume: 63mL Ischemia is seen in the right PCA territory These results were called by telephone at the time of interpretation on 11/19/2019 at 5:18 am to provider MCNEILL [facility] , who verbally acknowledged these results. IMPRESSION: 1. Distal basilar occlusion with minimal thready flow in the right PCA  distribution where there is 18 cc of penumbra. 2. Severe right supraclinoid ICA stenosis with right MCA branch underfilling, chronic appearing 3. High-grade narrowing at the left vertebrobasilar junction. No meaningful contribution to the basilar by the non dominant right vertebral artery. 4. Heavily calcified carotid bifurcations, especially on the left where ICA stenosis measures 75%. Electronically Signed   By: Monte Fantasia M.D.   On: 11/19/2019 05:24   CT Code Stroke CTA Neck W/WO contrast  Result Date: 11/19/2019 CLINICAL DATA:  Right MCA syndrome EXAM: CT ANGIOGRAPHY HEAD AND NECK CT PERFUSION BRAIN TECHNIQUE: Multidetector CT imaging of the head and neck was performed using the standard protocol during bolus administration of intravenous contrast. Multiplanar CT image reconstructions and MIPs were obtained to evaluate the vascular anatomy. Carotid stenosis measurements (when applicable) are obtained utilizing NASCET criteria, using the distal internal carotid diameter as the denominator. Multiphase CT imaging of the brain was performed following IV bolus contrast injection. Subsequent parametric perfusion maps were calculated using RAPID software. CONTRAST:  133mL OMNIPAQUE IOHEXOL 350 MG/ML SOLN COMPARISON:  Head CT from earlier today FINDINGS: CTA NECK FINDINGS Aortic arch: Atherosclerotic plaque with 3 vessel branching. 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 The LIMA and upper venous graft are patent. Right carotid system: Bulky  calcified plaque at the bifurcation without flow limiting stenosis or ulceration. No downstream beading or dissection. Left carotid system: Remarkably bulky calcified plaque at the bifurcation. ICA stenosis measures 75% on coronal reformats. No ulceration or dissection. Vertebral arteries: Proximal subclavian atherosclerosis without flow limiting stenosis. Atherosclerotic plaque on the dominant left V1 segment and V2 segment without flow limiting stenosis or beading. Faint flow in the non dominant right vertebral artery. Skeleton: Disc and facet degeneration. No acute or aggressive finding Other neck: Frothy secretions in the trachea. No acute or aggressive finding. Upper chest: Calcified granulomas in the apical lungs. Review of the MIP images confirms the above findings CTA HEAD FINDINGS Anterior circulation: Atherosclerotic plaque along both carotid siphons which is confluent. There is a severe stenosis at the supraclinoid right ICA segment with small vessel size limiting accurate measurement. No downstream branch occlusion although there is less well filled right MCA branches. The paraclinoid left ICA is moderately stenotic and likely overestimated visually due to degree of calcified plaque blooming. Stenosis is likely 50% or less. No downstream branch occlusion or beading. Atheromatous irregularity of medium size vessels. Posterior circulation: Strong left vertebral artery dominance. There is faint thready flow in the right vertebral artery with no meaningful contribution to the basilar. There is a high-grade short segment narrowing at the left vertebrobasilar junction. Abrupt loss of flow within the distal basilar with no flow seen in the P1 segments and throughout the majority of the right PCA territory. There is a moderate-sized left posterior communicating artery. Less intense enhancement is seen within the vertebrobasilar arteries compared to the carotids. Distal basilar occlusion is new from flow voids in  2014 brain MRI Venous sinuses: Unremarkable in the arterial phase Anatomic variants: As above Review of the MIP images confirms the above findings CT Brain Perfusion Findings: ASPECTS: 10 CBF (<30%) Volume: 41mL Perfusion (Tmax>6.0s) volume: 87mL Ischemia is seen in the right PCA territory These results were called by telephone at the time of interpretation on 11/19/2019 at 5:18 am to provider MCNEILL [facility] , who verbally acknowledged these results. IMPRESSION: 1. 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 2. Severe right supraclinoid ICA stenosis with  right MCA branch underfilling, chronic appearing 3. High-grade narrowing at the left vertebrobasilar junction. No meaningful contribution to the basilar by the non dominant right vertebral artery. 4. Heavily calcified carotid bifurcations, especially on the left where ICA stenosis measures 75%. Electronically Signed   By: Monte Fantasia M.D.   On: 11/19/2019 05:24   MR MRA HEAD WO CONTRAST  Result Date: 11/19/2019 CLINICAL DATA:  Stroke, follow-up EXAM: MRI HEAD WITHOUT CONTRAST MRA HEAD WITHOUT CONTRAST TECHNIQUE: Multiplanar, multiecho pulse sequences of the brain and surrounding structures were obtained without intravenous contrast. Angiographic images of the head were obtained using MRA technique without contrast. COMPARISON:  Recent CT imaging FINDINGS: MRI HEAD FINDINGS Brain: There are multiple small foci of reduced diffusion including involvement of the right hippocampus, right occipital lobe, left frontal lobe, left occipital lobe, bilateral cerebellum, and possibly the right thalamus. No evidence of intracranial hemorrhage. Prominence of the ventricles and sulci reflects generalized parenchymal volume loss. Patchy and confluent areas of T2 hyperintensity in the supratentorial white matter are nonspecific but probably reflect moderate chronic microvascular ischemic changes.  There are chronic small vessel infarcts of the basal ganglia. There is no intracranial mass or mass effect. No extra-axial fluid collection. Vascular: Major vessel flow voids at the skull base are preserved. Skull and upper cervical spine: Normal marrow signal. Sinuses/Orbits: Minor paranasal sinus mucosal thickening. Bilateral lens replacements. Other: Sella is unremarkable. MRA HEAD FINDINGS There is preserved flow related enhancement of the intracranial internal carotid arteries. Atherosclerotic irregularity is present along the cavernous and paraclinoid portions causing moderate stenosis. Proximal anterior and middle cerebral arteries are patent. Intracranial vertebral arteries are patent with atherosclerotic irregularity. The left vertebral artery is dominant. There appears to be splitting of the distal right vertebral artery. Flow within the right vertebral artery appears improved relative to the CTA. There is preserved flow related enhancement within the basilar artery and the proximal posterior cerebral arteries. A left posterior communicating arteries again identified. IMPRESSION: Several small foci of acute infarction as described, likely embolic. No evidence of hemorrhage. Compared to prior CTA, there is improved flow within the intracranial right vertebral artery, distal basilar artery, and right posterior cerebral artery. Chronic microvascular ischemic changes. Electronically Signed   By: Macy Mis M.D.   On: 11/19/2019 14:19   MR BRAIN WO CONTRAST  Result Date: 11/19/2019 CLINICAL DATA:  Stroke, follow-up EXAM: MRI HEAD WITHOUT CONTRAST MRA HEAD WITHOUT CONTRAST TECHNIQUE: Multiplanar, multiecho pulse sequences of the brain and surrounding structures were obtained without intravenous contrast. Angiographic images of the head were obtained using MRA technique without contrast. COMPARISON:  Recent CT imaging FINDINGS: MRI HEAD FINDINGS Brain: There are multiple small foci of reduced diffusion  including involvement of the right hippocampus, right occipital lobe, left frontal lobe, left occipital lobe, bilateral cerebellum, and possibly the right thalamus. No evidence of intracranial hemorrhage. Prominence of the ventricles and sulci reflects generalized parenchymal volume loss. Patchy and confluent areas of T2 hyperintensity in the supratentorial white matter are nonspecific but probably reflect moderate chronic microvascular ischemic changes. There are chronic small vessel infarcts of the basal ganglia. There is no intracranial mass or mass effect. No extra-axial fluid collection. Vascular: Major vessel flow voids at the skull base are preserved. Skull and upper cervical spine: Normal marrow signal. Sinuses/Orbits: Minor paranasal sinus mucosal thickening. Bilateral lens replacements. Other: Sella is unremarkable. MRA HEAD FINDINGS There is preserved flow related enhancement of the intracranial internal carotid arteries. Atherosclerotic irregularity is present along the cavernous and  paraclinoid portions causing moderate stenosis. Proximal anterior and middle cerebral arteries are patent. Intracranial vertebral arteries are patent with atherosclerotic irregularity. The left vertebral artery is dominant. There appears to be splitting of the distal right vertebral artery. Flow within the right vertebral artery appears improved relative to the CTA. There is preserved flow related enhancement within the basilar artery and the proximal posterior cerebral arteries. A left posterior communicating arteries again identified. IMPRESSION: Several small foci of acute infarction as described, likely embolic. No evidence of hemorrhage. Compared to prior CTA, there is improved flow within the intracranial right vertebral artery, distal basilar artery, and right posterior cerebral artery. Chronic microvascular ischemic changes. Electronically Signed   By: Macy Mis M.D.   On: 11/19/2019 14:19   IR CT Head  Ltd  Result Date: 11/20/2019 INDICATION: New onset left weakness, left-sided visual field deficit and left sided neglect. CT angiogram of the head and neck revealed distal basilar artery occlusion, right posterior cerebral artery occlusion. EXAM: 1. EMERGENT LARGE VESSEL OCCLUSION THROMBOLYSIS (POSTERIOR CIRCULATION) COMPARISON:  CT angiogram of the head and neck of 09/19/2019. MEDICATIONS: Vancomycin 1 g IV antibiotic was administered within 1 hour of the procedure. ANESTHESIA/SEDATION: General anesthesia. CONTRAST:  Isovue 300 approximately 65 mL. FLUOROSCOPY TIME:  Fluoroscopy Time: 29 minutes 36 seconds (1285 mGy). COMPLICATIONS: None immediate. TECHNIQUE: Following a full explanation of the procedure along with the potential associated complications, an informed witnessed consent was obtained from patient's daughter. The risks of intracranial hemorrhage of 10%, worsening neurological deficit, ventilator dependency, death and inability to revascularize were all reviewed in detail with the patient's daughter. The patient was then put under general anesthesia by the Department of Anesthesiology at [facility]. The right groin was prepped and draped in the usual sterile fashion. Thereafter using modified Seldinger technique, transfemoral access into the right common femoral artery was obtained without difficulty. Over a 0.035 inch guidewire a 5 French Pinnacle sheath was inserted. Through this, and also over a 0.035 inch guidewire a 5 [place] JB 1 catheter was advanced to the aortic arch region and selectively positioned in the left subclavian artery and the left vertebral artery artery. FINDINGS: The left subclavian arteriogram demonstrates wide patency of the left vertebral artery at its origin. There is a loop-shaped configuration of the proximal left vertebral artery. More distally, the vessel is seen to opacify to the cranial skull base. Wide patency is seen of the left vertebrobasilar junction and  the left posterior-inferior cerebellar artery. Tapered narrowing of approximately 50-60% is seen of proximal basilar artery at its confluence with the vertebrobasilar artery. Distal to this the basilar artery demonstrates wide patency to just proximal to the origin of the superior cerebellar arteries. Distal to this there is complete angiographic occlusion of the distal basilar artery. Nonvisualization of the posterior cerebral arteries and partially the superior cerebellar arteries is seen. PROCEDURE: The diagnostic JB 1 catheter in the distal left subclavian artery was exchanged over a 0.035 inch 300 cm Rosen exchange guidewire for an 8 French 80 cm Neuron Max sheath using biplane roadmap technique and constant fluoroscopic guidance. Good aspiration obtained from the hub of the Neuron Max sheath following removal of the exchange guidewire. Good aspiration was obtained from the hub of the Neuron Max sheath. A gentle contrast injection demonstrated no evidence of spasms, dissections or of intraluminal filling defects. The Neruon Max sheath was gently retrieved more proximally to engage the origin of the left vertebral artery. Over a 0.035 inch Roadrunner guidewire, using  biplane roadmap technique and constant fluoroscopic guidance, a 132 cm 6 [place] Catalyst guide catheter was then advanced without difficulty to the distal left vertebral artery followed by the advancement of the Neuron Max sheath into the proximal left vertebral artery. The guidewire was removed. Good aspiration obtained from the hub of the 6 [place] Catalyst guide catheter. Control arteriogram performed through this demonstrated no evidence of spasms, dissections or of intraluminal filling defects. A biplane roadmap was then obtained centered over the basilar artery. Over a 0.014 inch Synchro standard micro guidewire with a J configuration, a 150 cm Trevo ProVue microcatheter was advanced to the distal end of the Catalyst guide catheter. With the  micro guidewire leading with a J-tip configuration, the combination was navigated without difficulty through 50-60% stenosis at the junction of the left vertebrobasilar junction to the proximal basilar artery. The micro guidewire was then gently manipulated without much resistance into the left posterior cerebral artery P2 P3 region followed by the microcatheter. At this time, the Catalyst guide catheter was advanced to just proximal to the occluded distal basilar artery. The guidewire was removed. Good aspiration obtained from the hub of the microcatheter. A gentle control arteriogram performed through the microcatheter demonstrated safe position of tip of the microcatheter, which was connected to continuous heparinized saline infusion. A 4 mm x 40 mm Solitaire X retrieval device was then advanced to the distal end of the microcatheter. The O ring on the delivery microcatheter was then loosened. With slight forward gentle traction with the right hand on the delivery micro guidewire with the left hand the delivery microcatheter was retrieved unsheathing the distal and then the proximal portion of the retrieval device. The 8 [place] Catalyst guide catheter was now advanced into the orifice of the left posterior cerebral artery where no aspiration of blood was seen into the Penumbra aspiration tubing. This combination was maintained for approximately 2 minutes and 10 seconds. Thereafter, the microcatheter was retrieved proximally. The retrieval device was then gently retrieved more proximally in combination with the 6 [place] Catalyst guide catheter where a complete occlusion to aspiration was maintained as the combination was removed with constant aspiration being applied at the hub of the Neuron Max sheath. The retrieval device, and the micro guidewire were retrieved and removed. There was free aspiration through the Catalyst guide catheter which was maintained in the left vertebrobasilar junction. Specks of clot were  seen in the Penumbra collection device. A control arteriogram performed through the 6 [place] Catalyst guide catheter in the left vertebral artery demonstrated complete angiographic revascularization of the distal basilar artery, the posterior cerebral arteries and the superior cerebellar arteries which were duplicated in anatomic variation. A TICI 3 revascularization was achieved. A control arteriogram performed following removal of the Catalyst guide catheter from the Neuron Max sheath demonstrated patency of the left vertebral artery extra cranially and intracranially. Also noted was stable 50-60% stenosis at the junction of the left vertebrobasilar junction with the proximal basilar artery. Areas of caliber irregularity were seen in the posterior cerebral arteries bilaterally worse on the left side consistent with intracranial arteriosclerosis. However, a TICI 3 revascularization was maintained. Throughout the procedure, the patient's blood pressure and neurological status remained stable. No evidence of mass effect or extravasation was seen. The Neuron Max sheath was then removed. The 8 French Pinnacle sheath was then removed from the right groin puncture site. Hemostasis was achieved with manual compression over about 20-30 minutes. A CT scan of brain obtained demonstrated no evidence  of mass effect, midline shift or intracranial hemorrhage. The patient's general anesthesia was then reversed with patient extubated without much difficulty. Upon recovery, the patient was able to obey simple commands and move all fours spontaneously and to command. He was then transferred to the neuro ICU for post thrombectomy management. IMPRESSION: Status post endovascular complete revascularization of the distal basilar artery, the posterior cerebral arteries, the superior cerebellar arteries with 1 pass with the 4 mm x 40 mm Solitaire X retrieval device and Penumbra aspiration achieving a TICI 3 revascularization. PLAN:  Follow-up in the clinic 4 weeks post discharge. Electronically Signed   By: Luanne Bras M.D.   On: 11/19/2019 16:05   CT C-SPINE NO CHARGE  Result Date: 11/19/2019 CLINICAL DATA:  Stroke follow-up. EXAM: CT CERVICAL SPINE WITHOUT CONTRAST TECHNIQUE: Multidetector CT imaging of the cervical spine was performed without intravenous contrast. Multiplanar CT image reconstructions were also generated. COMPARISON:  Cervical radiography 04/26/2011 FINDINGS: Alignment: No traumatic malalignment Skull base and vertebrae: Negative for acute fracture. Soft tissues and spinal canal: No prevertebral fluid or swelling. No visible canal hematoma. Disc levels: Usual degenerative changes with mainly lower cervical disc narrowing and endplate ridging. No evidence of impingement Upper chest: No acute finding IMPRESSION: No evidence of cervical spine injury. Electronically Signed   By: Monte Fantasia M.D.   On: 11/19/2019 06:54   CT Code Stroke Cerebral Perfusion with contrast  Result Date: 11/19/2019 CLINICAL DATA:  Right MCA syndrome EXAM: CT ANGIOGRAPHY HEAD AND NECK CT PERFUSION BRAIN TECHNIQUE: Multidetector CT imaging of the head and neck was performed using the standard protocol during bolus administration of intravenous contrast. Multiplanar CT image reconstructions and MIPs were obtained to evaluate the vascular anatomy. Carotid stenosis measurements (when applicable) are obtained utilizing NASCET criteria, using the distal internal carotid diameter as the denominator. Multiphase CT imaging of the brain was performed following IV bolus contrast injection. Subsequent parametric perfusion maps were calculated using RAPID software. CONTRAST:  159mL OMNIPAQUE IOHEXOL 350 MG/ML SOLN COMPARISON:  Head CT from earlier today FINDINGS: CTA NECK FINDINGS Aortic arch: Atherosclerotic plaque with 3 vessel branching. 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 The LIMA and upper venous graft are patent. Right carotid system: Bulky calcified  plaque at the bifurcation without flow limiting stenosis or ulceration. No downstream beading or dissection. Left carotid system: Remarkably bulky calcified plaque at the bifurcation. ICA stenosis measures 75% on coronal reformats. No ulceration or dissection. Vertebral arteries: Proximal subclavian atherosclerosis without flow limiting stenosis. Atherosclerotic plaque on the dominant left V1 segment and V2 segment without flow limiting stenosis or beading. Faint flow in the non dominant right vertebral artery. Skeleton: Disc and facet degeneration. No acute or aggressive finding Other neck: Frothy secretions in the trachea. No acute or aggressive finding. Upper chest: Calcified granulomas in the apical lungs. Review of the MIP images confirms the above findings CTA HEAD FINDINGS Anterior circulation: Atherosclerotic plaque along both carotid siphons which is confluent. There is a severe stenosis at the supraclinoid right ICA segment with small vessel size limiting accurate measurement. No downstream branch occlusion although there is less well filled right MCA branches. The paraclinoid left ICA is moderately stenotic and likely overestimated visually due to degree of calcified plaque blooming. Stenosis is likely 50% or less. No downstream branch occlusion or beading. Atheromatous irregularity of medium size vessels. Posterior circulation: Strong left vertebral artery dominance. There is faint thready flow in the right vertebral artery with no meaningful contribution to the basilar. There is a high-grade  short segment narrowing at the left vertebrobasilar junction. Abrupt loss of flow within the distal basilar with no flow seen in the P1 segments and throughout the majority of the right PCA territory. There is a moderate-sized left posterior communicating artery. Less intense enhancement is seen within the vertebrobasilar arteries compared to the carotids. Distal basilar occlusion is new from flow voids in 2014  brain MRI Venous sinuses: Unremarkable in the arterial phase Anatomic variants: As above Review of the MIP images confirms the above findings CT Brain Perfusion Findings: ASPECTS: 10 CBF (<30%) Volume: 31mL Perfusion (Tmax>6.0s) volume: 79mL Ischemia is seen in the right PCA territory These results were called by telephone at the time of interpretation on 11/19/2019 at 5:18 am to provider MCNEILL [facility] , who verbally acknowledged these results. IMPRESSION: 1. 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 2. Severe right supraclinoid ICA stenosis with right MCA branch underfilling, chronic appearing 3. High-grade narrowing at the left vertebrobasilar junction. No meaningful contribution to the basilar by the non dominant right vertebral artery. 4. Heavily calcified carotid bifurcations, especially on the left where ICA stenosis measures 75%. Electronically Signed   By: Monte Fantasia M.D.   On: 11/19/2019 05:24   ECHOCARDIOGRAM COMPLETE  Result Date: 11/19/2019   ECHOCARDIOGRAM REPORT   Patient Name:   ALIXZANDER MCSWEEN Date of Exam: 11/19/2019 Medical Rec #:  [id number]        Height:       70.0 in Accession #:    [id number]       Weight:       148.1 lb Date of Birth:  Jan 15, 1928        BSA:          1.84 m Patient Age:    46 years         BP:           124/101 mmHg Patient Gender: M                HR:           106 bpm. Exam Location:  Inpatient Procedure: 2D Echo Indications:    Stroke 434.91 / I163.9  History:        Patient has prior history of Echocardiogram examinations, most                 recent 11/12/2019. Acute ST elevation myocardial infarction                 (STEMI) of posterior wall and CAD; Risk Factors:Hypertension,                 Diabetes and Dyslipidemia. Elevated troponin.  Sonographer:    Vikki Ports Turrentine Referring Phys: [place]  1. Left ventricular ejection fraction, by visual  estimation, is 20 to 25%. The left ventricle has normal function. There is no left ventricular hypertrophy.  2. Left ventricular diastolic parameters are indeterminate.  3. The left ventricle demonstrates global hypokinesis.  4. COmpared to previous echo on 11/12/19, LVEF is unchanged.  5. Global right ventricle mild to moderately reduced..The right ventricular size is normal. No increase in right ventricular wall thickness.  6. Left atrial size was moderately dilated.  7. Right atrial size was normal.  8. The mitral valve is grossly normal. Moderate mitral valve regurgitation.  9. The tricuspid valve is normal in structure. Tricuspid valve regurgitation is trivial. 10. The aortic  valve is tricuspid. Aortic valve regurgitation is not visualized. Mild aortic valve stenosis. 11. The pulmonic valve was normal in structure. Pulmonic valve regurgitation is not visualized. 12. The aortic root was not well visualized. 13. The inferior vena cava is normal in size with greater than 50% respiratory variability, suggesting right atrial pressure of 3 mmHg. FINDINGS  Left Ventricle: Left ventricular ejection fraction, by visual estimation, is 20 to 25%. The left ventricle has normal function. The left ventricle demonstrates global hypokinesis. There is no left ventricular hypertrophy. Left ventricular diastolic parameters are indeterminate. COmpared to previous echo on 11/12/19, LVEF is unchanged. Right Ventricle: The right ventricular size is normal. No increase in right ventricular wall thickness. Global RV systolic function is mild to moderately reduced. Left Atrium: Left atrial size was moderately dilated. Right Atrium: Right atrial size was normal in size Pericardium: There is no evidence of pericardial effusion. Mitral Valve: The mitral valve is grossly normal. Moderate mitral valve regurgitation. Tricuspid Valve: The tricuspid valve is normal in structure. Tricuspid valve regurgitation is trivial. Aortic Valve: The aortic  valve is tricuspid. Aortic valve regurgitation is not visualized. Mild aortic stenosis is present. Pulmonic Valve: The pulmonic valve was normal in structure. Pulmonic valve regurgitation is not visualized. Pulmonic regurgitation is not visualized. Aorta: The aortic root was not well visualized. Venous: The inferior vena cava is normal in size with greater than 50% respiratory variability, suggesting right atrial pressure of 3 mmHg. IAS/Shunts: No atrial level shunt detected by color flow Doppler.  LEFT VENTRICLE PLAX 2D LVIDd:         4.90 cm LVIDs:         4.40 cm LV PW:         1.00 cm LV IVS:        1.00 cm LVOT diam:     2.30 cm LV SV:         25 ml LV SV Index:   13.82 LVOT Area:     4.15 cm  LEFT ATRIUM              Index       RIGHT ATRIUM           Index LA diam:        4.40 cm  2.39 cm/m  RA Area:     14.60 cm LA Vol (A2C):   103.0 ml 56.06 ml/m RA Volume:   37.70 ml  20.52 ml/m LA Vol (A4C):   75.0 ml  40.82 ml/m LA Biplane Vol: 90.1 ml  49.04 ml/m  AORTIC VALVE LVOT Vmax:   70.70 cm/s LVOT Vmean:  48.133 cm/s LVOT VTI:    0.121 m  AORTA Ao Root diam: 3.20 cm  SHUNTS Systemic VTI:  0.12 m Systemic Diam: 2.30 cm  Dorris Carnes MD Electronically signed by Dorris Carnes MD Signature Date/Time: 11/19/2019/12:02:49 PM    Final    IR PERCUTANEOUS ART THROMBECTOMY/INFUSION INTRACRANIAL INC DIAG ANGIO  Result Date: 11/20/2019 INDICATION: New onset left weakness, left-sided visual field deficit and left sided neglect. CT angiogram of the head and neck revealed distal basilar artery occlusion, right posterior cerebral artery occlusion. EXAM: 1. EMERGENT LARGE VESSEL OCCLUSION THROMBOLYSIS (POSTERIOR CIRCULATION) COMPARISON:  CT angiogram of the head and neck of 09/19/2019. MEDICATIONS: Vancomycin 1 g IV antibiotic was administered within 1 hour of the procedure. ANESTHESIA/SEDATION: General anesthesia. CONTRAST:  Isovue 300 approximately 65 mL. FLUOROSCOPY TIME:  Fluoroscopy Time: 29 minutes 36 seconds (1285  mGy). COMPLICATIONS: None immediate. TECHNIQUE:  Following a full explanation of the procedure along with the potential associated complications, an informed witnessed consent was obtained from patient's daughter. The risks of intracranial hemorrhage of 10%, worsening neurological deficit, ventilator dependency, death and inability to revascularize were all reviewed in detail with the patient's daughter. The patient was then put under general anesthesia by the Department of Anesthesiology at [facility]. The right groin was prepped and draped in the usual sterile fashion. Thereafter using modified Seldinger technique, transfemoral access into the right common femoral artery was obtained without difficulty. Over a 0.035 inch guidewire a 5 French Pinnacle sheath was inserted. Through this, and also over a 0.035 inch guidewire a 5 [place] JB 1 catheter was advanced to the aortic arch region and selectively positioned in the left subclavian artery and the left vertebral artery artery. FINDINGS: The left subclavian arteriogram demonstrates wide patency of the left vertebral artery at its origin. There is a loop-shaped configuration of the proximal left vertebral artery. More distally, the vessel is seen to opacify to the cranial skull base. Wide patency is seen of the left vertebrobasilar junction and the left posterior-inferior cerebellar artery. Tapered narrowing of approximately 50-60% is seen of proximal basilar artery at its confluence with the vertebrobasilar artery. Distal to this the basilar artery demonstrates wide patency to just proximal to the origin of the superior cerebellar arteries. Distal to this there is complete angiographic occlusion of the distal basilar artery. Nonvisualization of the posterior cerebral arteries and partially the superior cerebellar arteries is seen. PROCEDURE: The diagnostic JB 1 catheter in the distal left subclavian artery was exchanged over a 0.035 inch 300 cm Rosen  exchange guidewire for an 8 French 80 cm Neuron Max sheath using biplane roadmap technique and constant fluoroscopic guidance. Good aspiration obtained from the hub of the Neuron Max sheath following removal of the exchange guidewire. Good aspiration was obtained from the hub of the Neuron Max sheath. A gentle contrast injection demonstrated no evidence of spasms, dissections or of intraluminal filling defects. The Neruon Max sheath was gently retrieved more proximally to engage the origin of the left vertebral artery. Over a 0.035 inch Roadrunner guidewire, using biplane roadmap technique and constant fluoroscopic guidance, a 132 cm 6 [place] Catalyst guide catheter was then advanced without difficulty to the distal left vertebral artery followed by the advancement of the Neuron Max sheath into the proximal left vertebral artery. The guidewire was removed. Good aspiration obtained from the hub of the 6 [place] Catalyst guide catheter. Control arteriogram performed through this demonstrated no evidence of spasms, dissections or of intraluminal filling defects. A biplane roadmap was then obtained centered over the basilar artery. Over a 0.014 inch Synchro standard micro guidewire with a J configuration, a 150 cm Trevo ProVue microcatheter was advanced to the distal end of the Catalyst guide catheter. With the micro guidewire leading with a J-tip configuration, the combination was navigated without difficulty through 50-60% stenosis at the junction of the left vertebrobasilar junction to the proximal basilar artery. The micro guidewire was then gently manipulated without much resistance into the left posterior cerebral artery P2 P3 region followed by the microcatheter. At this time, the Catalyst guide catheter was advanced to just proximal to the occluded distal basilar artery. The guidewire was removed. Good aspiration obtained from the hub of the microcatheter. A gentle control arteriogram performed through the  microcatheter demonstrated safe position of tip of the microcatheter, which was connected to continuous heparinized saline infusion. A 4 mm x  40 mm Solitaire X retrieval device was then advanced to the distal end of the microcatheter. The O ring on the delivery microcatheter was then loosened. With slight forward gentle traction with the right hand on the delivery micro guidewire with the left hand the delivery microcatheter was retrieved unsheathing the distal and then the proximal portion of the retrieval device. The 8 [place] Catalyst guide catheter was now advanced into the orifice of the left posterior cerebral artery where no aspiration of blood was seen into the Penumbra aspiration tubing. This combination was maintained for approximately 2 minutes and 10 seconds. Thereafter, the microcatheter was retrieved proximally. The retrieval device was then gently retrieved more proximally in combination with the 6 [place] Catalyst guide catheter where a complete occlusion to aspiration was maintained as the combination was removed with constant aspiration being applied at the hub of the Neuron Max sheath. The retrieval device, and the micro guidewire were retrieved and removed. There was free aspiration through the Catalyst guide catheter which was maintained in the left vertebrobasilar junction. Specks of clot were seen in the Penumbra collection device. A control arteriogram performed through the 6 [place] Catalyst guide catheter in the left vertebral artery demonstrated complete angiographic revascularization of the distal basilar artery, the posterior cerebral arteries and the superior cerebellar arteries which were duplicated in anatomic variation. A TICI 3 revascularization was achieved. A control arteriogram performed following removal of the Catalyst guide catheter from the Neuron Max sheath demonstrated patency of the left vertebral artery extra cranially and intracranially. Also noted was stable 50-60%  stenosis at the junction of the left vertebrobasilar junction with the proximal basilar artery. Areas of caliber irregularity were seen in the posterior cerebral arteries bilaterally worse on the left side consistent with intracranial arteriosclerosis. However, a TICI 3 revascularization was maintained. Throughout the procedure, the patient's blood pressure and neurological status remained stable. No evidence of mass effect or extravasation was seen. The Neuron Max sheath was then removed. The 8 French Pinnacle sheath was then removed from the right groin puncture site. Hemostasis was achieved with manual compression over about 20-30 minutes. A CT scan of brain obtained demonstrated no evidence of mass effect, midline shift or intracranial hemorrhage. The patient's general anesthesia was then reversed with patient extubated without much difficulty. Upon recovery, the patient was able to obey simple commands and move all fours spontaneously and to command. He was then transferred to the neuro ICU for post thrombectomy management. IMPRESSION: Status post endovascular complete revascularization of the distal basilar artery, the posterior cerebral arteries, the superior cerebellar arteries with 1 pass with the 4 mm x 40 mm Solitaire X retrieval device and Penumbra aspiration achieving a TICI 3 revascularization. PLAN: Follow-up in the clinic 4 weeks post discharge. Electronically Signed   By: Luanne Bras M.D.   On: 11/19/2019 16:05   CT HEAD CODE STROKE WO CONTRAST  Result Date: 11/19/2019 CLINICAL DATA:  Code stroke.  Ataxia with stroke suspected EXAM: CT HEAD WITHOUT CONTRAST TECHNIQUE: Contiguous axial images were obtained from the base of the skull through the vertex without intravenous contrast. COMPARISON:  Brain MRI 07/11/2013 FINDINGS: Brain: No evidence of acute infarction, hemorrhage, hydrocephalus, extra-axial collection or mass lesion/mass effect. Generalized atrophy. Chronic ischemic injury  about the left basal ganglia in the lateral lenticulostriate distribution. Small vessel ischemic gliosis in the white matter is mild. Cerebral volume loss in keeping with age. Vascular: Atherosclerotic calcification that is prominent. No hyperdense vessel. Skull: Normal. Negative for fracture or  focal lesion. Sinuses/Orbits: Bilateral cataract resection. Other: These results were communicated to Dr. Leonel Ramsay at 4:54 amon 12/14/2020by text page via the [facility] messaging system. ASPECTS [facility] Stroke Program Early CT Score) Not scored with this history IMPRESSION: Aging brain without acute or reversible finding. Electronically Signed   By: Monte Fantasia M.D.   On: 11/19/2019 04:57    Assessment and Plan:   NEILL ROWAN is a 85 y.o. male with a hx of CAD status post remote CABG with recent stenting of SVG to PDA, hypothyroidism, hypertension, hyperlipidemia, diabetes and GI bleed who is being seen today for the evaluation of CHF, blood pressure management at the request of Dr. Erlinda Hong.  1. Dyspnea: daughter reports he has had intermittent episodes of shortness of breath over the weekend since coming home. Not really associated with exertion. Placed on Brilinta last admission. Given Brilinta today around 9:30 and then developed shortness of breath about 2 hours later. Suspect dyspnea could be related to Brilinta? Will switch to plavix, load 300mg  tonight and plan for 75mg  daily. This is also reduce his risk of bleeding.   2. Chronic systolic HF: Does not have significant signs of volume overload on exam. Will hold on IV lasix at this time. His BB and ARB have been held to allow for permissive hypertension per neuro.  -- check BNP  3. Stroke: presented with left sided weakness. CT head positive. Underwent thrombectomy 12/14. Now with good ROM with left arm and leg. Management per neurology   4. CAD s/p CABG and recent stenting: admitted with STEMI 12/7 with PCI/DES to SVG-PDA. Placed on monotherapy with  Brilinta given concern for GI bleeding. EKG today with continued ST elevated in inferolateral leads similar to previous tracings. No chest pain.  -- will switch to plavix as above. No ASA  5. Anemia: Hgb was stable around 10 on admission. Noted 7.7 this morning, repeat 8.9.  -- follow CBCs  6. HL: remains on high dose statin  For questions or updates, please contact [place] Please consult [email or web address] for contact info under     Signed, Reino Bellis, NP  11/20/2019 1:49 PM

## 2019-11-20 NOTE — Progress Notes (Signed)
Called by RN that pt had cheyne stokes breathing pattern and irregular heart rhythm. Come to bedside, pt awake but lethargic, cheyne stokes breathing pattern, HR ranging from 70s to 120s. Orientated with dysarthria, moving all extremities. Had small amount of bowel movement (yellow not dark or bloody color) and was sent for stool guaiac. Blood tinged urine in the condom catheter but foley catheter earlier today showed clear urine. EKG showed junctional rhythm with PACs and PVCs. Stat H&H repeat and troponin ordered.  Pt daughter stepped in shortly after, pt then seems more calm down and more interactive. HR stabilized and less arrhythmia now. Pt seems more comfortable. Cardiology NP at bedside. Dr. Tamala Julian will come by later. Will continue to monitor.   Rosalin Hawking, MD PhD Stroke Neurology 11/20/2019 2:24 PM

## 2019-11-20 NOTE — Progress Notes (Signed)
STROKE TEAM PROGRESS NOTE   INTERVAL HISTORY Pt RN at bedside. Pt has been stable. Ate all his breakfast. Still has mild SOB and BP on the low end. TTE showed EF again 20-25%. Will have cardiology for consultation regarding BP and CHF management. He stated that his GI doctor told him in the past, no more ASA. I will d/c his ASA this time. Continue brilinta. Repeat EKG.     Vitals:   11/20/19 0500 11/20/19 0600 11/20/19 0700 11/20/19 0800  BP: 104/68 (!) 121/42 97/76 101/72  Pulse: 84 76 84 88  Resp: (!) 27 17 19  (!) 28  Temp:      TempSrc:      SpO2: 98% 98% 96% 100%  Weight:      Height:        CBC:  Recent Labs  Lab 11/19/19 0432 11/19/19 0512 11/20/19 0500  WBC 8.2  --  8.3  NEUTROABS 6.3  --  7.3  HGB 10.4* 11.2* 7.7*  HCT 33.8* 33.0* 24.9*  MCV 101.2*  --  100.0  PLT 679*  --  518*    Basic Metabolic Panel:  Recent Labs  Lab 11/19/19 0432 11/19/19 0512 11/20/19 0500  NA 141 141 140  K 4.1 4.2 4.1  CL 108 107 111  CO2 20*  --  21*  GLUCOSE 177* 168* 178*  BUN 13 16 16   CREATININE 0.96 0.80 0.84  CALCIUM 8.8*  --  8.4*   Lipid Panel:     Component Value Date/Time   CHOL 81 11/20/2019 0500   TRIG 67 11/20/2019 0500   HDL 32 (L) 11/20/2019 0500   CHOLHDL 2.5 11/20/2019 0500   VLDL 13 11/20/2019 0500   LDLCALC 36 11/20/2019 0500   HgbA1c:  Lab Results  Component Value Date   HGBA1C 6.9 (H) 11/20/2019   Urine Drug Screen:     Component Value Date/Time   LABOPIA NONE DETECTED 11/19/2019 0900   COCAINSCRNUR NONE DETECTED 11/19/2019 0900   LABBENZ NONE DETECTED 11/19/2019 0900   AMPHETMU NONE DETECTED 11/19/2019 0900   THCU NONE DETECTED 11/19/2019 0900   LABBARB NONE DETECTED 11/19/2019 0900    Alcohol Level     Component Value Date/Time   ETH <10 11/19/2019 0432    IMAGING  MR BRAIN WO CONTRAST MR MRA HEAD WO CONTRAST 11/19/2019 1419 Several small foci of acute infarction as described, likely embolic. No evidence of hemorrhage. Compared  to prior CTA, there is improved flow within the intracranial right vertebral artery, distal basilar artery, and right posterior cerebral artery. Chronic microvascular ischemic changes.   ECHOCARDIOGRAM COMPLETE 11/19/2019 1. Left ventricular ejection fraction, by visual estimation, is 20 to 25%. The left ventricle has normal function. There is no left ventricular hypertrophy.  2. Left ventricular diastolic parameters are indeterminate.  3. The left ventricle demonstrates global hypokinesis.  4. COmpared to previous echo on 11/12/19, LVEF is unchanged.  5. Global right ventricle mild to moderately reduced..The right ventricular size is normal. No increase in right ventricular wall thickness.  6. Left atrial size was moderately dilated.  7. Right atrial size was normal.  8. The mitral valve is grossly normal. Moderate mitral valve regurgitation.  9. The tricuspid valve is normal in structure. Tricuspid valve regurgitation is trivial. 10. The aortic valve is tricuspid. Aortic valve regurgitation is not visualized. Mild aortic valve stenosis. 11. The pulmonic valve was normal in structure. Pulmonic valve regurgitation is not visualized. 12. The aortic root was not well visualized. 13.  The inferior vena cava is normal in size with greater than 50% respiratory variability, suggesting right atrial pressure of 3 mmHg.     Cerebral Angio 11/19/2019 S/P Lt VA angiogram followed by complete revascularization of basilar artery with x 1 pass with 44mm x 40 mm solitaire X retriever device and penumbra aspiration achieving a TICI 3 revascularization. 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  CT C-SPINE NO CHARGE 11/19/2019 0654 No evidence of cervical spine injury.   CT Code Stroke CTA Head W/WO contrast CT Code Stroke CTA Neck W/WO contrast CT Code Stroke Cerebral Perfusion with contrast 11/19/2019 0524 1. Distal basilar occlusion with minimal thready flow in the right PCA distribution where there is 18  cc of penumbra. 2. Severe right supraclinoid ICA stenosis with right MCA branch underfilling, chronic appearing 3. High-grade narrowing at the left vertebrobasilar junction. No meaningful contribution to the basilar by the non dominant right vertebral artery. 4. Heavily calcified carotid bifurcations, especially on the left where ICA stenosis measures 75%.   CT HEAD CODE STROKE WO CONTRAST 11/19/2019 0457 Aging brain without acute or reversible finding.    PHYSICAL EXAM   Temp:  [97.6 F (36.4 C)-98.5 F (36.9 C)] 97.9 F (36.6 C) (12/15 0400) Pulse Rate:  [45-102] 88 (12/15 0800) Resp:  [13-33] 28 (12/15 0800) BP: (88-125)/(42-99) 101/72 (12/15 0800) SpO2:  [85 %-100 %] 100 % (12/15 0800) Arterial Line BP: (42-133)/(31-98) 123/66 (12/15 0800)  General - Well nourished, well developed, mild SOB.  Ophthalmologic - fundi not visualized due to noncooperation.  Cardiovascular - irregularly irregular heart rate and rhythm, but on tele and EKG showed sinus arrhythmia with PCAs and PVCs.  Mental Status -  Level of arousal and orientation to time, place, and person were intact. Language including expression, naming, repetition, comprehension was assessed and found intact.  Cranial Nerves II - XII - II - Visual field intact OU. III, IV, VI - Extraocular movements intact. V - Facial sensation intact bilaterally. VII - Facial movement intact bilaterally. VIII - Hearing & vestibular intact bilaterally. X - Palate elevates symmetrically. XI - Chin turning & shoulder shrug intact bilaterally. XII - Tongue protrusion intact.  Motor Strength - The patient's strength was symmetrical in all extremities and pronator drift was absent.  Bulk was normal and fasciculations were absent.   Motor Tone - Muscle tone was assessed at the neck and appendages and was normal.  Reflexes - The patient's reflexes were symmetrical in all extremities and he had no pathological reflexes.  Sensory - Light  touch, temperature/pinprick were assessed and were symmetrical.    Coordination - The patient had normal movements in the hands with no ataxia or dysmetria.  Tremor was absent.  Gait and Station - deferred.   ASSESSMENT/PLAN Kenneth [name] is a 85 y.o. male with history of recent STEMI s/p stenting who was d/c home 12/11, developing L sided weakness. Found to have a BA occlusion and sent to IR.    Stroke: small scattered B cerebellar, right PCA, left MCA and MCA/PCA, right thalamus in setting of BA and right PCA occlusion s/p IR with TICI3 reperfusion and intra/extracranial stenosis, likely secondary to large vessel disease with hypotension and CHF, vs. cardioembolic source with low EF and possible occult afib  Code Stroke CT head No acute abnormality.  CTA head & neck distal BA occlusion w/ thready flow R PCA w/ 18cc penumbra. Severe R supraclinoid ICA stenosis w/ R MCA branch underfilling (chronic). High-grade  narrowing L VBJ. Heavily calcified ICA bifurcations, L 75%.  CT perfusion R PCA w/ 18cc penumbra  Cerebral angio TICI 3 revascularization of BA. 50-60% stenosis proximal BA.  Post IR CT NO ICH or mass effect.   MRI  Several small foci acute infarct as above. Small vessel disease.  MRA  Improved flow intracranial R VA, distal BA, R PCA  2D Echo  EF 20-25% with no SOE, unchanged since 11/12/19  LDL 36  HgbA1c 6.9  SCDs for VTE prophylaxis  Brilinta (ticagrelor) 90 mg bid prior to admission, now on aspirin 81 mg daily and Brilinta (ticagrelor) 90 mg bid. Will d/c ASA due to hx of severe GIB. Pt not good anticoagulation at this time due to hx of severe GIB and need of brilinta for cardiac stent  Therapy recommendations:  pending   Disposition:  pending (caregiver for wife who has dementia)  Recent STEMI s/p stenting  11/12/19 admitted by cardiology  Cardiac cath - severe multivessle CAD  S/p stenting RCA  On brilinta only due to hx of GIB   Hx of CABG  1993  Still has mild SOB - will have cardiology consultation  Severe intracranial atherosclerosis  CTA head and neck showed Severe R supraclinoid ICA stenosis w/ R MCA branch underfilling (chronic). Severe left ICA siphon high grade stenosis. High-grade narrowing L VBJ. Heavily calcified ICA bifurcations bilaterally L>R, with L 75%.  On DAPT  Avoid low BP  Long-term BP goal 130-150 given severe intracranial stenosis  Cardiomyopathy   EF 20-25% 11/12/19  Current TTE EF 20-25% with low BP  Cardiology consulted  On brilinta  Hx of GIB with anemia  Recent diverticulitis and large HH w/ hx colectomy 2-3 yrs ago, treated with cipro and flagyl. d/c 11/04/2019  Hx remote GIB on ASA  Hb 8.5 on 11/14/19  No ASA as per cardiology post stenting due to hx of GIB  Currently Hb 11.2->7.7  Continue brilinta, d/c ASA  Repeat H&H at 4pm  Transfuse if Hb < 7.0  Hx if hypertension Hypotension   Home meds:  imdur 60, losartan 12.5, metoprolol 25  Stable on the low end . Long-term BP goal 130-150 given severe intracranial stenosis  Hyperlipidemia  Home meds:  crestor 20, resumed in hospital  LDL 50, goal < 70  Continue statin at discharge  Diabetes type II Controlled  Home meds:  Metformin 500 acb  HgbA1c 6.8, goal < 7.0  CBGs  SSI  Glucose stable   Other Stroke Risk Factors  Advanced age  Former Cigarette smoker  ETOH use, alcohol level <10, advised to drink no more than 2 drink(s) a day  Other Active Problems  Hypothyroid on synthroid  C-collar cleared after CT C-spine and pain free neck movement   Hospital day # 1  This patient is critically ill due to  and at significant risk of neurological worsening, death form BA occlusion, severe intracranial stenosis, recent STEMI, low EF, hx of CABG and GIB, CHF, severe anemia. This patient's care requires constant monitoring of vital signs, hemodynamics, respiratory and cardiac monitoring, review of multiple  databases, neurological assessment, discussion with family, other specialists and medical decision making of high complexity. I spent 45 minutes of neurocritical care time in the care of this patient.  Rosalin Hawking, MD PhD Stroke Neurology 11/20/2019 8:37 AM    To contact Stroke Continuity provider, please refer to [email or web address] After hours, contact General Neurology

## 2019-11-20 NOTE — Significant Event (Addendum)
Rapid Response Event Note  Received a call from nursing staff with concerns of STEMI activation. I came to the bedside, looked at the EKG with the Neuro MD, patient had a STEMI last week and had a stent placed. Came in yesterday as Code Stroke and underwent thrombectomy.CARDS NP/MD came to the bedside as well. Patient did have shortness of breath earlier but he states that now it is better. He did have some mild chest discomfort earlier but denies any now. HR 100s - AJR, stable BP, 100% on 4L [place]. H/H now 7.7/24.9 from 10.5/31.0  Plan: -- STAT Labs -- STAT CXR -- STAT EKG  -- Rest per CARDS team  Kenneth [name] R

## 2019-11-20 NOTE — Progress Notes (Signed)
OT Cancellation Note  Patient Details Name: Kenneth [name] MRN: [id number] DOB: 1928-05-14   Cancelled Treatment:    Reason Eval/Treat Not Completed: Medical issues which prohibited therapy Pt recently has started having SOB and potentially a cardiac event.  Will hold off on the evaluation and check back tomorrow as available and appropriate to initiate OT [place], MSOT, OTR/L [place] [facility] Office: [place] 11/20/2019, 4:27 PM

## 2019-11-20 NOTE — Progress Notes (Signed)
SLP Cancellation Note  Patient Details Name: Kenneth [name] MRN: [id number] DOB: 1928/03/03   Cancelled treatment:       Reason Eval/Treat Not Completed: Medical issues which prohibited therapy. Order for speech/language/cognition evaluation received and acknowledged.  RN reported that pt was not appropriate for a ST evaluation at this time secondary to acute SOB and possible cardiac event.  SLP will re-attempt evaluation tomorrow if pt is appropriate.     Elvia Collum Martise Waddell 11/20/2019, 2:36 PM

## 2019-11-20 NOTE — Progress Notes (Signed)
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 Estanislado Pandy. Patient awake and alert laying in bed. States he feels "groggy" this AM. Can spontaneously move all extremities. Speech clear. Right groin incision c/d/i.   Allergies: Lasix [furosemide], Spironolactone, Aspirin, Lipitor [atorvastatin], Lopressor [metoprolol tartrate], and Penicillins  Medications: Prior to Admission medications   Medication Sig Start Date End Date Taking? Authorizing Provider  acetaminophen (TYLENOL) 325 MG tablet Take 325 mg by mouth every 6 (six) hours as needed for mild pain.     [provider]  Artificial Tear Solution (SOOTHE XP OP) Apply 1 drop to eye as needed (dry eyes).     [provider]  Cyanocobalamin (VITAMIN B-12 PO) Take 1 tablet by mouth daily.    [provider]  ferrous sulfate 325 (65 FE) MG tablet Take 325 mg by mouth daily with breakfast.    [provider]  isosorbide mononitrate (IMDUR) 60 MG 24 hr tablet Take 60 mg by mouth every evening.     [provider]  latanoprost (XALATAN) 0.005 % ophthalmic solution Place 1 drop into both eyes at bedtime.    [provider]  levothyroxine (SYNTHROID, LEVOTHROID) 75 MCG tablet Take 50 mcg by mouth daily before breakfast.     [provider]  losartan (COZAAR) 25 MG tablet Take 0.5 tablets (12.5 mg total) by mouth daily. 11/16/19   Cheryln Manly, NP  metFORMIN (GLUCOPHAGE) 1000 MG tablet Take 500 mg by mouth daily with breakfast.     [provider]  metoprolol succinate (TOPROL-XL) 50 MG 24 hr tablet TAKE ONE-HALF (1/2) TABLET BY MOUTH DAILY WITH OR AFTER A MEAL Patient taking differently: Take 25  mg by mouth daily. WITH OR AFTER A MEAL 10/29/19   Belva Crome, MD  Multiple Vitamin (MULTIVITAMIN WITH MINERALS) TABS tablet Take 1 tablet by mouth daily.    [provider]  nitroGLYCERIN (NITROSTAT) 0.4 MG SL tablet Place 1 tablet (0.4 mg total) under the tongue every 5 (five) minutes as needed. 11/16/19   Cheryln Manly, NP  ondansetron (ZOFRAN) 4 MG tablet Take 1 tablet (4 mg total) by mouth every 6 (six) hours as needed for nausea. 11/04/19   Alma Friendly, MD  pantoprazole (PROTONIX) 40 MG tablet Take 1 tablet (40 mg total) by mouth 2 (two) times daily for 30 days, THEN 1 tablet (40 mg total) daily. 11/16/19 01/15/20  Cheryln Manly, NP  potassium chloride (K-DUR,KLOR-CON) 10 MEQ tablet Take 10 mEq by mouth every Monday, Wednesday, and Friday.  06/27/18   [provider]  rosuvastatin (CRESTOR) 20 MG tablet Take 1 tablet (20 mg total) by mouth daily at 6 PM. 11/16/19   Cheryln Manly, NP  ticagrelor (BRILINTA) 90 MG TABS tablet Take 1 tablet (90 mg total) by mouth 2 (two) times daily. 11/16/19   Cheryln Manly, NP     Vital Signs: BP 107/74   Pulse 73   Temp (!) 97.2 F (36.2 C) (Oral)   Resp (!) 21   Ht 5\' 10"  (1.778 m)   Wt 149 lb 14.6 oz (68 kg)   SpO2 100%   BMI 21.51 kg/m   Physical Exam Vitals and nursing note reviewed.  Constitutional:      General:  He is not in acute distress.    Appearance: Normal appearance.  Pulmonary:     Effort: Pulmonary effort is normal. No respiratory distress.  Skin:    General: Skin is warm and dry.     Comments: Right groin incision soft without active bleeding or hematoma.  Neurological:     Mental Status: He is alert.     Comments: Alert, awake, and oriented x3. Speech and comprehension intact. PERRL bilaterally. No facial asymmetry. Tongue midline. Can spontaneously move all extremities. No pronator drift. Distal pulses 1+ bilaterally.  Psychiatric:        Mood and Affect: Mood normal.         Behavior: Behavior normal.     Imaging: CT Code Stroke CTA Head W/WO contrast  Result Date: 11/19/2019 CLINICAL DATA:  Right MCA syndrome EXAM: CT ANGIOGRAPHY HEAD AND NECK CT PERFUSION BRAIN TECHNIQUE: Multidetector CT imaging of the head and neck was performed using the standard protocol during bolus administration of intravenous contrast. Multiplanar CT image reconstructions and MIPs were obtained to evaluate the vascular anatomy. Carotid stenosis measurements (when applicable) are obtained utilizing NASCET criteria, using the distal internal carotid diameter as the denominator. Multiphase CT imaging of the brain was performed following IV bolus contrast injection. Subsequent parametric perfusion maps were calculated using RAPID software. CONTRAST:  141mL OMNIPAQUE IOHEXOL 350 MG/ML SOLN COMPARISON:  Head CT from earlier today FINDINGS: CTA NECK FINDINGS Aortic arch: Atherosclerotic plaque with 3 vessel branching. 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 The LIMA and upper venous graft are patent. Right carotid system: Bulky calcified plaque at the bifurcation without flow limiting stenosis or ulceration. No downstream beading or dissection. Left carotid system: Remarkably bulky calcified plaque at the bifurcation. ICA stenosis measures 75% on coronal reformats. No ulceration or dissection. Vertebral arteries: Proximal subclavian atherosclerosis without flow limiting stenosis. Atherosclerotic plaque on the dominant left V1 segment and V2 segment without flow limiting stenosis or beading. Faint flow in the non dominant right vertebral artery. Skeleton: Disc and facet degeneration. No acute or aggressive finding Other neck: Frothy secretions in the trachea. No acute or aggressive finding. Upper chest: Calcified granulomas in the apical lungs. Review of the MIP images confirms the above findings CTA HEAD FINDINGS Anterior circulation: Atherosclerotic plaque along both carotid siphons which is confluent. There is a severe  stenosis at the supraclinoid right ICA segment with small vessel size limiting accurate measurement. No downstream branch occlusion although there is less well filled right MCA branches. The paraclinoid left ICA is moderately stenotic and likely overestimated visually due to degree of calcified plaque blooming. Stenosis is likely 50% or less. No downstream branch occlusion or beading. Atheromatous irregularity of medium size vessels. Posterior circulation: Strong left vertebral artery dominance. There is faint thready flow in the right vertebral artery with no meaningful contribution to the basilar. There is a high-grade short segment narrowing at the left vertebrobasilar junction. Abrupt loss of flow within the distal basilar with no flow seen in the P1 segments and throughout the majority of the right PCA territory. There is a moderate-sized left posterior communicating artery. Less intense enhancement is seen within the vertebrobasilar arteries compared to the carotids. Distal basilar occlusion is new from flow voids in 2014 brain MRI Venous sinuses: Unremarkable in the arterial phase Anatomic variants: As above Review of the MIP images confirms the above findings CT Brain Perfusion Findings: ASPECTS: 10 CBF (<30%) Volume: 53mL Perfusion (Tmax>6.0s) volume: 56mL Ischemia is seen in the right PCA territory These results  were called by telephone at the time of interpretation on 11/19/2019 at 5:18 am to provider [facility] , who verbally acknowledged these results. IMPRESSION: 1. 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 2. Severe right supraclinoid ICA stenosis with right MCA branch underfilling, chronic appearing 3. High-grade narrowing at the left vertebrobasilar junction. No meaningful contribution to the basilar by the non dominant right vertebral artery. 4. Heavily calcified carotid bifurcations, especially on the left where ICA  stenosis measures 75%. Electronically Signed   By: Monte Fantasia M.D.   On: 11/19/2019 05:24   CT Code Stroke CTA Neck W/WO contrast  Result Date: 11/19/2019 CLINICAL DATA:  Right MCA syndrome EXAM: CT ANGIOGRAPHY HEAD AND NECK CT PERFUSION BRAIN TECHNIQUE: Multidetector CT imaging of the head and neck was performed using the standard protocol during bolus administration of intravenous contrast. Multiplanar CT image reconstructions and MIPs were obtained to evaluate the vascular anatomy. Carotid stenosis measurements (when applicable) are obtained utilizing NASCET criteria, using the distal internal carotid diameter as the denominator. Multiphase CT imaging of the brain was performed following IV bolus contrast injection. Subsequent parametric perfusion maps were calculated using RAPID software. CONTRAST:  127mL OMNIPAQUE IOHEXOL 350 MG/ML SOLN COMPARISON:  Head CT from earlier today FINDINGS: CTA NECK FINDINGS Aortic arch: Atherosclerotic plaque with 3 vessel branching. 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 The LIMA and upper venous graft are patent. Right carotid system: Bulky calcified plaque at the bifurcation without flow limiting stenosis or ulceration. No downstream beading or dissection. Left carotid system: Remarkably bulky calcified plaque at the bifurcation. ICA stenosis measures 75% on coronal reformats. No ulceration or dissection. Vertebral arteries: Proximal subclavian atherosclerosis without flow limiting stenosis. Atherosclerotic plaque on the dominant left V1 segment and V2 segment without flow limiting stenosis or beading. Faint flow in the non dominant right vertebral artery. Skeleton: Disc and facet degeneration. No acute or aggressive finding Other neck: Frothy secretions in the trachea. No acute or aggressive finding. Upper chest: Calcified granulomas in the apical lungs. Review of the MIP images confirms the above findings CTA HEAD FINDINGS Anterior circulation: Atherosclerotic plaque along both  carotid siphons which is confluent. There is a severe stenosis at the supraclinoid right ICA segment with small vessel size limiting accurate measurement. No downstream branch occlusion although there is less well filled right MCA branches. The paraclinoid left ICA is moderately stenotic and likely overestimated visually due to degree of calcified plaque blooming. Stenosis is likely 50% or less. No downstream branch occlusion or beading. Atheromatous irregularity of medium size vessels. Posterior circulation: Strong left vertebral artery dominance. There is faint thready flow in the right vertebral artery with no meaningful contribution to the basilar. There is a high-grade short segment narrowing at the left vertebrobasilar junction. Abrupt loss of flow within the distal basilar with no flow seen in the P1 segments and throughout the majority of the right PCA territory. There is a moderate-sized left posterior communicating artery. Less intense enhancement is seen within the vertebrobasilar arteries compared to the carotids. Distal basilar occlusion is new from flow voids in 2014 brain MRI Venous sinuses: Unremarkable in the arterial phase Anatomic variants: As above Review of the MIP images confirms the above findings CT Brain Perfusion Findings: ASPECTS: 10 CBF (<30%) Volume: 62mL Perfusion (Tmax>6.0s) volume: 24mL Ischemia is seen in the right PCA territory These results were called by telephone at the time of interpretation on 11/19/2019 at 5:18 am to  provider [facility] , who verbally acknowledged these results. IMPRESSION: 1. 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 2. Severe right supraclinoid ICA stenosis with right MCA branch underfilling, chronic appearing 3. High-grade narrowing at the left vertebrobasilar junction. No meaningful contribution to the basilar by the non dominant right vertebral artery. 4. Heavily calcified  carotid bifurcations, especially on the left where ICA stenosis measures 75%. Electronically Signed   By: Monte Fantasia M.D.   On: 11/19/2019 05:24   MR MRA HEAD WO CONTRAST  Result Date: 11/19/2019 CLINICAL DATA:  Stroke, follow-up EXAM: MRI HEAD WITHOUT CONTRAST MRA HEAD WITHOUT CONTRAST TECHNIQUE: Multiplanar, multiecho pulse sequences of the brain and surrounding structures were obtained without intravenous contrast. Angiographic images of the head were obtained using MRA technique without contrast. COMPARISON:  Recent CT imaging FINDINGS: MRI HEAD FINDINGS Brain: There are multiple small foci of reduced diffusion including involvement of the right hippocampus, right occipital lobe, left frontal lobe, left occipital lobe, bilateral cerebellum, and possibly the right thalamus. No evidence of intracranial hemorrhage. Prominence of the ventricles and sulci reflects generalized parenchymal volume loss. Patchy and confluent areas of T2 hyperintensity in the supratentorial white matter are nonspecific but probably reflect moderate chronic microvascular ischemic changes. There are chronic small vessel infarcts of the basal ganglia. There is no intracranial mass or mass effect. No extra-axial fluid collection. Vascular: Major vessel flow voids at the skull base are preserved. Skull and upper cervical spine: Normal marrow signal. Sinuses/Orbits: Minor paranasal sinus mucosal thickening. Bilateral lens replacements. Other: Sella is unremarkable. MRA HEAD FINDINGS There is preserved flow related enhancement of the intracranial internal carotid arteries. Atherosclerotic irregularity is present along the cavernous and paraclinoid portions causing moderate stenosis. Proximal anterior and middle cerebral arteries are patent. Intracranial vertebral arteries are patent with atherosclerotic irregularity. The left vertebral artery is dominant. There appears to be splitting of the distal right vertebral artery. Flow within  the right vertebral artery appears improved relative to the CTA. There is preserved flow related enhancement within the basilar artery and the proximal posterior cerebral arteries. A left posterior communicating arteries again identified. IMPRESSION: Several small foci of acute infarction as described, likely embolic. No evidence of hemorrhage. Compared to prior CTA, there is improved flow within the intracranial right vertebral artery, distal basilar artery, and right posterior cerebral artery. Chronic microvascular ischemic changes. Electronically Signed   By: Macy Mis M.D.   On: 11/19/2019 14:19   MR BRAIN WO CONTRAST  Result Date: 11/19/2019 CLINICAL DATA:  Stroke, follow-up EXAM: MRI HEAD WITHOUT CONTRAST MRA HEAD WITHOUT CONTRAST TECHNIQUE: Multiplanar, multiecho pulse sequences of the brain and surrounding structures were obtained without intravenous contrast. Angiographic images of the head were obtained using MRA technique without contrast. COMPARISON:  Recent CT imaging FINDINGS: MRI HEAD FINDINGS Brain: There are multiple small foci of reduced diffusion including involvement of the right hippocampus, right occipital lobe, left frontal lobe, left occipital lobe, bilateral cerebellum, and possibly the right thalamus. No evidence of intracranial hemorrhage. Prominence of the ventricles and sulci reflects generalized parenchymal volume loss. Patchy and confluent areas of T2 hyperintensity in the supratentorial white matter are nonspecific but probably reflect moderate chronic microvascular ischemic changes. There are chronic small vessel infarcts of the basal ganglia. There is no intracranial mass or mass effect. No extra-axial fluid collection. Vascular: Major vessel flow voids at the skull base are preserved. Skull and upper cervical spine: Normal marrow  signal. Sinuses/Orbits: Minor paranasal sinus mucosal thickening. Bilateral lens replacements. Other: Sella is unremarkable. MRA HEAD FINDINGS  There is preserved flow related enhancement of the intracranial internal carotid arteries. Atherosclerotic irregularity is present along the cavernous and paraclinoid portions causing moderate stenosis. Proximal anterior and middle cerebral arteries are patent. Intracranial vertebral arteries are patent with atherosclerotic irregularity. The left vertebral artery is dominant. There appears to be splitting of the distal right vertebral artery. Flow within the right vertebral artery appears improved relative to the CTA. There is preserved flow related enhancement within the basilar artery and the proximal posterior cerebral arteries. A left posterior communicating arteries again identified. IMPRESSION: Several small foci of acute infarction as described, likely embolic. No evidence of hemorrhage. Compared to prior CTA, there is improved flow within the intracranial right vertebral artery, distal basilar artery, and right posterior cerebral artery. Chronic microvascular ischemic changes. Electronically Signed   By: Macy Mis M.D.   On: 11/19/2019 14:19   IR CT Head Ltd  Result Date: 11/20/2019 INDICATION: New onset left weakness, left-sided visual field deficit and left sided neglect. CT angiogram of the head and neck revealed distal basilar artery occlusion, right posterior cerebral artery occlusion. EXAM: 1. EMERGENT LARGE VESSEL OCCLUSION THROMBOLYSIS (POSTERIOR CIRCULATION) COMPARISON:  CT angiogram of the head and neck of 09/19/2019. MEDICATIONS: Vancomycin 1 g IV antibiotic was administered within 1 hour of the procedure. ANESTHESIA/SEDATION: General anesthesia. CONTRAST:  Isovue 300 approximately 65 mL. FLUOROSCOPY TIME:  Fluoroscopy Time: 29 minutes 36 seconds (1285 mGy). COMPLICATIONS: None immediate. TECHNIQUE: Following a full explanation of the procedure along with the potential associated complications, an informed witnessed consent was obtained from patient's daughter. The risks of intracranial  hemorrhage of 10%, worsening neurological deficit, ventilator dependency, death and inability to revascularize were all reviewed in detail with the patient's daughter. The patient was then put under general anesthesia by the Department of Anesthesiology at [facility]. The right groin was prepped and draped in the usual sterile fashion. Thereafter using modified Seldinger technique, transfemoral access into the right common femoral artery was obtained without difficulty. Over a 0.035 inch guidewire a 5 French Pinnacle sheath was inserted. Through this, and also over a 0.035 inch guidewire a 5 [place] JB 1 catheter was advanced to the aortic arch region and selectively positioned in the left subclavian artery and the left vertebral artery artery. FINDINGS: The left subclavian arteriogram demonstrates wide patency of the left vertebral artery at its origin. There is a loop-shaped configuration of the proximal left vertebral artery. More distally, the vessel is seen to opacify to the cranial skull base. Wide patency is seen of the left vertebrobasilar junction and the left posterior-inferior cerebellar artery. Tapered narrowing of approximately 50-60% is seen of proximal basilar artery at its confluence with the vertebrobasilar artery. Distal to this the basilar artery demonstrates wide patency to just proximal to the origin of the superior cerebellar arteries. Distal to this there is complete angiographic occlusion of the distal basilar artery. Nonvisualization of the posterior cerebral arteries and partially the superior cerebellar arteries is seen. PROCEDURE: The diagnostic JB 1 catheter in the distal left subclavian artery was exchanged over a 0.035 inch 300 cm Rosen exchange guidewire for an 8 French 80 cm Neuron Max sheath using biplane roadmap technique and constant fluoroscopic guidance. Good aspiration obtained from the hub of the Neuron Max sheath following removal of the exchange guidewire. Good  aspiration was obtained from the hub of the Neuron Max sheath. A gentle  contrast injection demonstrated no evidence of spasms, dissections or of intraluminal filling defects. The Neruon Max sheath was gently retrieved more proximally to engage the origin of the left vertebral artery. Over a 0.035 inch Roadrunner guidewire, using biplane roadmap technique and constant fluoroscopic guidance, a 132 cm 6 [place] Catalyst guide catheter was then advanced without difficulty to the distal left vertebral artery followed by the advancement of the Neuron Max sheath into the proximal left vertebral artery. The guidewire was removed. Good aspiration obtained from the hub of the 6 [place] Catalyst guide catheter. Control arteriogram performed through this demonstrated no evidence of spasms, dissections or of intraluminal filling defects. A biplane roadmap was then obtained centered over the basilar artery. Over a 0.014 inch Synchro standard micro guidewire with a J configuration, a 150 cm Trevo ProVue microcatheter was advanced to the distal end of the Catalyst guide catheter. With the micro guidewire leading with a J-tip configuration, the combination was navigated without difficulty through 50-60% stenosis at the junction of the left vertebrobasilar junction to the proximal basilar artery. The micro guidewire was then gently manipulated without much resistance into the left posterior cerebral artery P2 P3 region followed by the microcatheter. At this time, the Catalyst guide catheter was advanced to just proximal to the occluded distal basilar artery. The guidewire was removed. Good aspiration obtained from the hub of the microcatheter. A gentle control arteriogram performed through the microcatheter demonstrated safe position of tip of the microcatheter, which was connected to continuous heparinized saline infusion. A 4 mm x 40 mm Solitaire X retrieval device was then advanced to the distal end of the microcatheter. The O ring  on the delivery microcatheter was then loosened. With slight forward gentle traction with the right hand on the delivery micro guidewire with the left hand the delivery microcatheter was retrieved unsheathing the distal and then the proximal portion of the retrieval device. The 8 [place] Catalyst guide catheter was now advanced into the orifice of the left posterior cerebral artery where no aspiration of blood was seen into the Penumbra aspiration tubing. This combination was maintained for approximately 2 minutes and 10 seconds. Thereafter, the microcatheter was retrieved proximally. The retrieval device was then gently retrieved more proximally in combination with the 6 [place] Catalyst guide catheter where a complete occlusion to aspiration was maintained as the combination was removed with constant aspiration being applied at the hub of the Neuron Max sheath. The retrieval device, and the micro guidewire were retrieved and removed. There was free aspiration through the Catalyst guide catheter which was maintained in the left vertebrobasilar junction. Specks of clot were seen in the Penumbra collection device. A control arteriogram performed through the 6 [place] Catalyst guide catheter in the left vertebral artery demonstrated complete angiographic revascularization of the distal basilar artery, the posterior cerebral arteries and the superior cerebellar arteries which were duplicated in anatomic variation. A TICI 3 revascularization was achieved. A control arteriogram performed following removal of the Catalyst guide catheter from the Neuron Max sheath demonstrated patency of the left vertebral artery extra cranially and intracranially. Also noted was stable 50-60% stenosis at the junction of the left vertebrobasilar junction with the proximal basilar artery. Areas of caliber irregularity were seen in the posterior cerebral arteries bilaterally worse on the left side consistent with intracranial arteriosclerosis.  However, a TICI 3 revascularization was maintained. Throughout the procedure, the patient's blood pressure and neurological status remained stable. No evidence of mass effect or extravasation was seen. The Neuron  Max sheath was then removed. The 8 French Pinnacle sheath was then removed from the right groin puncture site. Hemostasis was achieved with manual compression over about 20-30 minutes. A CT scan of brain obtained demonstrated no evidence of mass effect, midline shift or intracranial hemorrhage. The patient's general anesthesia was then reversed with patient extubated without much difficulty. Upon recovery, the patient was able to obey simple commands and move all fours spontaneously and to command. He was then transferred to the neuro ICU for post thrombectomy management. IMPRESSION: Status post endovascular complete revascularization of the distal basilar artery, the posterior cerebral arteries, the superior cerebellar arteries with 1 pass with the 4 mm x 40 mm Solitaire X retrieval device and Penumbra aspiration achieving a TICI 3 revascularization. PLAN: Follow-up in the clinic 4 weeks post discharge. Electronically Signed   By: Luanne Bras M.D.   On: 11/19/2019 16:05   CT C-SPINE NO CHARGE  Result Date: 11/19/2019 CLINICAL DATA:  Stroke follow-up. EXAM: CT CERVICAL SPINE WITHOUT CONTRAST TECHNIQUE: Multidetector CT imaging of the cervical spine was performed without intravenous contrast. Multiplanar CT image reconstructions were also generated. COMPARISON:  Cervical radiography 04/26/2011 FINDINGS: Alignment: No traumatic malalignment Skull base and vertebrae: Negative for acute fracture. Soft tissues and spinal canal: No prevertebral fluid or swelling. No visible canal hematoma. Disc levels: Usual degenerative changes with mainly lower cervical disc narrowing and endplate ridging. No evidence of impingement Upper chest: No acute finding IMPRESSION: No evidence of cervical spine injury.  Electronically Signed   By: Monte Fantasia M.D.   On: 11/19/2019 06:54   CT Code Stroke Cerebral Perfusion with contrast  Result Date: 11/19/2019 CLINICAL DATA:  Right MCA syndrome EXAM: CT ANGIOGRAPHY HEAD AND NECK CT PERFUSION BRAIN TECHNIQUE: Multidetector CT imaging of the head and neck was performed using the standard protocol during bolus administration of intravenous contrast. Multiplanar CT image reconstructions and MIPs were obtained to evaluate the vascular anatomy. Carotid stenosis measurements (when applicable) are obtained utilizing NASCET criteria, using the distal internal carotid diameter as the denominator. Multiphase CT imaging of the brain was performed following IV bolus contrast injection. Subsequent parametric perfusion maps were calculated using RAPID software. CONTRAST:  111mL OMNIPAQUE IOHEXOL 350 MG/ML SOLN COMPARISON:  Head CT from earlier today FINDINGS: CTA NECK FINDINGS Aortic arch: Atherosclerotic plaque with 3 vessel branching. 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 The LIMA and upper venous graft are patent. Right carotid system: Bulky calcified plaque at the bifurcation without flow limiting stenosis or ulceration. No downstream beading or dissection. Left carotid system: Remarkably bulky calcified plaque at the bifurcation. ICA stenosis measures 75% on coronal reformats. No ulceration or dissection. Vertebral arteries: Proximal subclavian atherosclerosis without flow limiting stenosis. Atherosclerotic plaque on the dominant left V1 segment and V2 segment without flow limiting stenosis or beading. Faint flow in the non dominant right vertebral artery. Skeleton: Disc and facet degeneration. No acute or aggressive finding Other neck: Frothy secretions in the trachea. No acute or aggressive finding. Upper chest: Calcified granulomas in the apical lungs. Review of the MIP images confirms the above findings CTA HEAD FINDINGS Anterior circulation: Atherosclerotic plaque along both carotid siphons  which is confluent. There is a severe stenosis at the supraclinoid right ICA segment with small vessel size limiting accurate measurement. No downstream branch occlusion although there is less well filled right MCA branches. The paraclinoid left ICA is moderately stenotic and likely overestimated visually due to degree of calcified plaque blooming. Stenosis is likely 50% or less. No downstream  branch occlusion or beading. Atheromatous irregularity of medium size vessels. Posterior circulation: Strong left vertebral artery dominance. There is faint thready flow in the right vertebral artery with no meaningful contribution to the basilar. There is a high-grade short segment narrowing at the left vertebrobasilar junction. Abrupt loss of flow within the distal basilar with no flow seen in the P1 segments and throughout the majority of the right PCA territory. There is a moderate-sized left posterior communicating artery. Less intense enhancement is seen within the vertebrobasilar arteries compared to the carotids. Distal basilar occlusion is new from flow voids in 2014 brain MRI Venous sinuses: Unremarkable in the arterial phase Anatomic variants: As above Review of the MIP images confirms the above findings CT Brain Perfusion Findings: ASPECTS: 10 CBF (<30%) Volume: 50mL Perfusion (Tmax>6.0s) volume: 21mL Ischemia is seen in the right PCA territory These results were called by telephone at the time of interpretation on 11/19/2019 at 5:18 am to provider [facility] , who verbally acknowledged these results. IMPRESSION: 1. 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 2. Severe right supraclinoid ICA stenosis with right MCA branch underfilling, chronic appearing 3. High-grade narrowing at the left vertebrobasilar junction. No meaningful contribution to the basilar by the non dominant right vertebral artery. 4. Heavily calcified carotid  bifurcations, especially on the left where ICA stenosis measures 75%. Electronically Signed   By: Monte Fantasia M.D.   On: 11/19/2019 05:24   ECHOCARDIOGRAM COMPLETE  Result Date: 11/19/2019   ECHOCARDIOGRAM REPORT   Patient Name:   Kenneth [name] Date of Exam: 11/19/2019 Medical Rec #:  [id number]        Height:       70.0 in Accession #:    [id number]       Weight:       148.1 lb Date of Birth:  1928-05-02        BSA:          1.84 m Patient Age:    85 years         BP:           124/101 mmHg Patient Gender: M                HR:           106 bpm. Exam Location:  Inpatient Procedure: 2D Echo Indications:    Stroke 434.91 / I163.9  History:        Patient has prior history of Echocardiogram examinations, most                 recent 11/12/2019. Acute ST elevation myocardial infarction                 (STEMI) of posterior wall and CAD; Risk Factors:Hypertension,                 Diabetes and Dyslipidemia. Elevated troponin.  Sonographer:    Vikki Ports Turrentine Referring Phys: [place]  1. Left ventricular ejection fraction, by visual estimation, is 20 to 25%. The left ventricle has normal function. There is no left ventricular hypertrophy.  2. Left ventricular diastolic parameters are indeterminate.  3. The left ventricle demonstrates global hypokinesis.  4. COmpared to previous echo on 11/12/19, LVEF is unchanged.  5. Global right ventricle mild to moderately reduced..The right ventricular size is normal. No increase in right ventricular wall thickness.  6. Left atrial size was moderately  dilated.  7. Right atrial size was normal.  8. The mitral valve is grossly normal. Moderate mitral valve regurgitation.  9. The tricuspid valve is normal in structure. Tricuspid valve regurgitation is trivial. 10. The aortic valve is tricuspid. Aortic valve regurgitation is not visualized. Mild aortic valve stenosis. 11. The pulmonic valve was normal in structure. Pulmonic valve regurgitation is  not visualized. 12. The aortic root was not well visualized. 13. The inferior vena cava is normal in size with greater than 50% respiratory variability, suggesting right atrial pressure of 3 mmHg. FINDINGS  Left Ventricle: Left ventricular ejection fraction, by visual estimation, is 20 to 25%. The left ventricle has normal function. The left ventricle demonstrates global hypokinesis. There is no left ventricular hypertrophy. Left ventricular diastolic parameters are indeterminate. COmpared to previous echo on 11/12/19, LVEF is unchanged. Right Ventricle: The right ventricular size is normal. No increase in right ventricular wall thickness. Global RV systolic function is mild to moderately reduced. Left Atrium: Left atrial size was moderately dilated. Right Atrium: Right atrial size was normal in size Pericardium: There is no evidence of pericardial effusion. Mitral Valve: The mitral valve is grossly normal. Moderate mitral valve regurgitation. Tricuspid Valve: The tricuspid valve is normal in structure. Tricuspid valve regurgitation is trivial. Aortic Valve: The aortic valve is tricuspid. Aortic valve regurgitation is not visualized. Mild aortic stenosis is present. Pulmonic Valve: The pulmonic valve was normal in structure. Pulmonic valve regurgitation is not visualized. Pulmonic regurgitation is not visualized. Aorta: The aortic root was not well visualized. Venous: The inferior vena cava is normal in size with greater than 50% respiratory variability, suggesting right atrial pressure of 3 mmHg. IAS/Shunts: No atrial level shunt detected by color flow Doppler.  LEFT VENTRICLE PLAX 2D LVIDd:         4.90 cm LVIDs:         4.40 cm LV PW:         1.00 cm LV IVS:        1.00 cm LVOT diam:     2.30 cm LV SV:         25 ml LV SV Index:   13.82 LVOT Area:     4.15 cm  LEFT ATRIUM              Index       RIGHT ATRIUM           Index LA diam:        4.40 cm  2.39 cm/m  RA Area:     14.60 cm LA Vol (A2C):   103.0 ml  56.06 ml/m RA Volume:   37.70 ml  20.52 ml/m LA Vol (A4C):   75.0 ml  40.82 ml/m LA Biplane Vol: 90.1 ml  49.04 ml/m  AORTIC VALVE LVOT Vmax:   70.70 cm/s LVOT Vmean:  48.133 cm/s LVOT VTI:    0.121 m  AORTA Ao Root diam: 3.20 cm  SHUNTS Systemic VTI:  0.12 m Systemic Diam: 2.30 cm  Dorris Carnes MD Electronically signed by Dorris Carnes MD Signature Date/Time: 11/19/2019/12:02:49 PM    Final    IR PERCUTANEOUS ART THROMBECTOMY/INFUSION INTRACRANIAL INC DIAG ANGIO  Result Date: 11/20/2019 INDICATION: New onset left weakness, left-sided visual field deficit and left sided neglect. CT angiogram of the head and neck revealed distal basilar artery occlusion, right posterior cerebral artery occlusion. EXAM: 1. EMERGENT LARGE VESSEL OCCLUSION THROMBOLYSIS (POSTERIOR CIRCULATION) COMPARISON:  CT angiogram of the head and neck of 09/19/2019. MEDICATIONS: Vancomycin  1 g IV antibiotic was administered within 1 hour of the procedure. ANESTHESIA/SEDATION: General anesthesia. CONTRAST:  Isovue 300 approximately 65 mL. FLUOROSCOPY TIME:  Fluoroscopy Time: 29 minutes 36 seconds (1285 mGy). COMPLICATIONS: None immediate. TECHNIQUE: Following a full explanation of the procedure along with the potential associated complications, an informed witnessed consent was obtained from patient's daughter. The risks of intracranial hemorrhage of 10%, worsening neurological deficit, ventilator dependency, death and inability to revascularize were all reviewed in detail with the patient's daughter. The patient was then put under general anesthesia by the Department of Anesthesiology at [facility]. The right groin was prepped and draped in the usual sterile fashion. Thereafter using modified Seldinger technique, transfemoral access into the right common femoral artery was obtained without difficulty. Over a 0.035 inch guidewire a 5 French Pinnacle sheath was inserted. Through this, and also over a 0.035 inch guidewire a 5 [place] JB 1  catheter was advanced to the aortic arch region and selectively positioned in the left subclavian artery and the left vertebral artery artery. FINDINGS: The left subclavian arteriogram demonstrates wide patency of the left vertebral artery at its origin. There is a loop-shaped configuration of the proximal left vertebral artery. More distally, the vessel is seen to opacify to the cranial skull base. Wide patency is seen of the left vertebrobasilar junction and the left posterior-inferior cerebellar artery. Tapered narrowing of approximately 50-60% is seen of proximal basilar artery at its confluence with the vertebrobasilar artery. Distal to this the basilar artery demonstrates wide patency to just proximal to the origin of the superior cerebellar arteries. Distal to this there is complete angiographic occlusion of the distal basilar artery. Nonvisualization of the posterior cerebral arteries and partially the superior cerebellar arteries is seen. PROCEDURE: The diagnostic JB 1 catheter in the distal left subclavian artery was exchanged over a 0.035 inch 300 cm Rosen exchange guidewire for an 8 French 80 cm Neuron Max sheath using biplane roadmap technique and constant fluoroscopic guidance. Good aspiration obtained from the hub of the Neuron Max sheath following removal of the exchange guidewire. Good aspiration was obtained from the hub of the Neuron Max sheath. A gentle contrast injection demonstrated no evidence of spasms, dissections or of intraluminal filling defects. The Neruon Max sheath was gently retrieved more proximally to engage the origin of the left vertebral artery. Over a 0.035 inch Roadrunner guidewire, using biplane roadmap technique and constant fluoroscopic guidance, a 132 cm 6 [place] Catalyst guide catheter was then advanced without difficulty to the distal left vertebral artery followed by the advancement of the Neuron Max sheath into the proximal left vertebral artery. The guidewire was  removed. Good aspiration obtained from the hub of the 6 [place] Catalyst guide catheter. Control arteriogram performed through this demonstrated no evidence of spasms, dissections or of intraluminal filling defects. A biplane roadmap was then obtained centered over the basilar artery. Over a 0.014 inch Synchro standard micro guidewire with a J configuration, a 150 cm Trevo ProVue microcatheter was advanced to the distal end of the Catalyst guide catheter. With the micro guidewire leading with a J-tip configuration, the combination was navigated without difficulty through 50-60% stenosis at the junction of the left vertebrobasilar junction to the proximal basilar artery. The micro guidewire was then gently manipulated without much resistance into the left posterior cerebral artery P2 P3 region followed by the microcatheter. At this time, the Catalyst guide catheter was advanced to just proximal to the occluded distal basilar artery. The guidewire was removed.  Good aspiration obtained from the hub of the microcatheter. A gentle control arteriogram performed through the microcatheter demonstrated safe position of tip of the microcatheter, which was connected to continuous heparinized saline infusion. A 4 mm x 40 mm Solitaire X retrieval device was then advanced to the distal end of the microcatheter. The O ring on the delivery microcatheter was then loosened. With slight forward gentle traction with the right hand on the delivery micro guidewire with the left hand the delivery microcatheter was retrieved unsheathing the distal and then the proximal portion of the retrieval device. The 8 [place] Catalyst guide catheter was now advanced into the orifice of the left posterior cerebral artery where no aspiration of blood was seen into the Penumbra aspiration tubing. This combination was maintained for approximately 2 minutes and 10 seconds. Thereafter, the microcatheter was retrieved proximally. The retrieval device was then  gently retrieved more proximally in combination with the 6 [place] Catalyst guide catheter where a complete occlusion to aspiration was maintained as the combination was removed with constant aspiration being applied at the hub of the Neuron Max sheath. The retrieval device, and the micro guidewire were retrieved and removed. There was free aspiration through the Catalyst guide catheter which was maintained in the left vertebrobasilar junction. Specks of clot were seen in the Penumbra collection device. A control arteriogram performed through the 6 [place] Catalyst guide catheter in the left vertebral artery demonstrated complete angiographic revascularization of the distal basilar artery, the posterior cerebral arteries and the superior cerebellar arteries which were duplicated in anatomic variation. A TICI 3 revascularization was achieved. A control arteriogram performed following removal of the Catalyst guide catheter from the Neuron Max sheath demonstrated patency of the left vertebral artery extra cranially and intracranially. Also noted was stable 50-60% stenosis at the junction of the left vertebrobasilar junction with the proximal basilar artery. Areas of caliber irregularity were seen in the posterior cerebral arteries bilaterally worse on the left side consistent with intracranial arteriosclerosis. However, a TICI 3 revascularization was maintained. Throughout the procedure, the patient's blood pressure and neurological status remained stable. No evidence of mass effect or extravasation was seen. The Neuron Max sheath was then removed. The 8 French Pinnacle sheath was then removed from the right groin puncture site. Hemostasis was achieved with manual compression over about 20-30 minutes. A CT scan of brain obtained demonstrated no evidence of mass effect, midline shift or intracranial hemorrhage. The patient's general anesthesia was then reversed with patient extubated without much difficulty. Upon  recovery, the patient was able to obey simple commands and move all fours spontaneously and to command. He was then transferred to the neuro ICU for post thrombectomy management. IMPRESSION: Status post endovascular complete revascularization of the distal basilar artery, the posterior cerebral arteries, the superior cerebellar arteries with 1 pass with the 4 mm x 40 mm Solitaire X retrieval device and Penumbra aspiration achieving a TICI 3 revascularization. PLAN: Follow-up in the clinic 4 weeks post discharge. Electronically Signed   By: Luanne Bras M.D.   On: 11/19/2019 16:05   CT HEAD CODE STROKE WO CONTRAST  Result Date: 11/19/2019 CLINICAL DATA:  Code stroke.  Ataxia with stroke suspected EXAM: CT HEAD WITHOUT CONTRAST TECHNIQUE: Contiguous axial images were obtained from the base of the skull through the vertex without intravenous contrast. COMPARISON:  Brain MRI 07/11/2013 FINDINGS: Brain: No evidence of acute infarction, hemorrhage, hydrocephalus, extra-axial collection or mass lesion/mass effect. Generalized atrophy. Chronic ischemic injury about the left basal ganglia  in the lateral lenticulostriate distribution. Small vessel ischemic gliosis in the white matter is mild. Cerebral volume loss in keeping with age. Vascular: Atherosclerotic calcification that is prominent. No hyperdense vessel. Skull: Normal. Negative for fracture or focal lesion. Sinuses/Orbits: Bilateral cataract resection. Other: These results were communicated to Dr. Leonel Ramsay at 4:54 amon 12/14/2020by text page via the [facility] messaging system. ASPECTS [facility] Stroke Program Early CT Score) Not scored with this history IMPRESSION: Aging brain without acute or reversible finding. Electronically Signed   By: Monte Fantasia M.D.   On: 11/19/2019 04:57    Labs:  CBC: Recent Labs    11/15/19 0612 11/15/19 2326 11/19/19 0432 11/19/19 0512 11/20/19 0500  WBC 9.9 10.3 8.2  --  8.3  HGB 8.8* 8.8* 10.4* 11.2* 7.7*    HCT 27.4* 28.2* 33.8* 33.0* 24.9*  PLT 483* 503* 679*  --  518*    COAGS: Recent Labs    11/12/19 1045 11/19/19 0432  INR 1.1 1.0  APTT 34 30    BMP: Recent Labs    11/15/19 0612 11/16/19 0143 11/19/19 0432 11/19/19 0512 11/20/19 0500  NA 138 138 141 141 140  K 4.2 3.9 4.1 4.2 4.1  CL 108 107 108 107 111  CO2 22 22 20*  --  21*  GLUCOSE 156* 134* 177* 168* 178*  BUN 8 10 13 16 16   CALCIUM 8.3* 8.4* 8.8*  --  8.4*  CREATININE 0.79 0.78 0.96 0.80 0.84  GFRNONAA >60 >60 >60  --  >60  GFRAA >60 >60 >60  --  >60    LIVER FUNCTION TESTS: Recent Labs    11/02/19 0815 11/12/19 1045 11/19/19 0432  BILITOT 1.1 0.6 0.4  AST 21 22 22   ALT 11 20 14   ALKPHOS 113 63 66  PROT 7.8 5.6* 6.5  ALBUMIN 3.7 2.2* 2.7*    Assessment and Plan:  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 Estanislado Pandy. Patient's condition improving- can spontaneously move all extremities, speech clear. Right groin incision stable, distal pulses 1+ bilaterally. Plan to follow-up with Dr. Estanislado Pandy in clinic 4 weeks after discharge- order placed to facilitate this. Further plans per neurology- appreciate and agree with management. Please call NIR with questions/concerns.   Electronically Signed: Earley Abide, PA-C 11/20/2019, 9:33 AM   I spent a total of 25 Minutes at the the patient's bedside AND on the patient's hospital floor or unit, greater than 50% of which was counseling/coordinating care for basilar artery occlusion s/p revascularization.

## 2019-11-20 NOTE — Progress Notes (Signed)
PT Cancellation Note  Patient Details Name: Kenneth [name] MRN: [id number] DOB: January 29, 1928   Cancelled Treatment:    Reason Eval/Treat Not Completed: Medical issues which prohibited therapy.  Pt recently has started having SOB and potentially a cardiac event.  Will hold off on the evaluation and check back tomorrow as able. 11/20/2019  Ginger Carne., PT Acute Rehabilitation Services [phone]  (pager) [phone]  (office)   Tessie Fass Elon Eoff 11/20/2019, 2:13 PM

## 2019-11-21 ENCOUNTER — Encounter (HOSPITAL_COMMUNITY): Payer: Self-pay | Admitting: Neurology

## 2019-11-21 DIAGNOSIS — I5042 Chronic combined systolic (congestive) and diastolic (congestive) heart failure: Secondary | ICD-10-CM

## 2019-11-21 LAB — BASIC METABOLIC PANEL
Anion gap: 13 (ref 5–15)
BUN: 25 mg/dL — ABNORMAL HIGH (ref 8–23)
CO2: 19 mmol/L — ABNORMAL LOW (ref 22–32)
Calcium: 8.5 mg/dL — ABNORMAL LOW (ref 8.9–10.3)
Chloride: 112 mmol/L — ABNORMAL HIGH (ref 98–111)
Creatinine, Ser: 0.81 mg/dL (ref 0.61–1.24)
GFR calc Af Amer: >60 mL/min (ref >60)
GFR calc non Af Amer: >60 mL/min (ref >60)
Glucose, Bld: 115 mg/dL — ABNORMAL HIGH (ref 70–99)
Potassium: 4.2 mmol/L (ref 3.5–5.1)
Sodium: 144 mmol/L (ref 135–145)

## 2019-11-21 LAB — GLUCOSE, CAPILLARY
Glucose-Capillary: 107 mg/dL — ABNORMAL HIGH (ref 70–99)
Glucose-Capillary: 139 mg/dL — ABNORMAL HIGH (ref 70–99)
Glucose-Capillary: 179 mg/dL — ABNORMAL HIGH (ref 70–99)
Glucose-Capillary: 157 mg/dL — ABNORMAL HIGH (ref 70–99)
Glucose-Capillary: 123 mg/dL — ABNORMAL HIGH (ref 70–99)

## 2019-11-21 LAB — CBC
HCT: 27.6 % — ABNORMAL LOW (ref 39.0–52.0)
Hemoglobin: 8.8 g/dL — ABNORMAL LOW (ref 13.0–17.0)
MCH: 31.1 pg (ref 26.0–34.0)
MCHC: 31.9 g/dL (ref 30.0–36.0)
MCV: 97.5 fL (ref 80.0–100.0)
Platelets: 569 10*3/uL — ABNORMAL HIGH (ref 150–400)
RBC: 2.83 MIL/uL — ABNORMAL LOW (ref 4.22–5.81)
RDW: 14 % (ref 11.5–15.5)
WBC: 11.4 10*3/uL — ABNORMAL HIGH (ref 4.0–10.5)
nRBC: 0 % (ref 0.0–0.2)

## 2019-11-21 LAB — BRAIN NATRIURETIC PEPTIDE: B Natriuretic Peptide: 2174.4 pg/mL — ABNORMAL HIGH (ref 0.0–100.0)

## 2019-11-21 MED ORDER — ALPRAZOLAM 0.5 MG PO TABS
0.5000 mg | ORAL_TABLET | Freq: Three times a day (TID) | ORAL | Status: DC | PRN
  Administered 2019-11-21 – 2019-11-23 (×3): 0.5 mg via ORAL
  Filled 2019-11-21 (×3): qty 1

## 2019-11-21 MED ORDER — INSULIN ASPART 100 UNIT/ML ~~LOC~~ SOLN
0.0000 [IU] | Freq: Three times a day (TID) | SUBCUTANEOUS | Status: DC
  Administered 2019-11-21 (×2): 3 [IU] via SUBCUTANEOUS
  Administered 2019-11-21 – 2019-11-22 (×3): 2 [IU] via SUBCUTANEOUS
  Administered 2019-11-22 – 2019-11-23 (×2): 3 [IU] via SUBCUTANEOUS
  Administered 2019-11-23: 2 [IU] via SUBCUTANEOUS

## 2019-11-21 MED ORDER — FERROUS SULFATE 325 (65 FE) MG PO TABS
325.0000 mg | ORAL_TABLET | Freq: Every day | ORAL | Status: DC
  Administered 2019-11-22 – 2019-11-23 (×2): 325 mg via ORAL
  Filled 2019-11-21 (×3): qty 1

## 2019-11-21 MED ORDER — PANTOPRAZOLE SODIUM 40 MG PO TBEC: 40.0000 mg | DELAYED_RELEASE_TABLET | Freq: Every day | ORAL | Status: DC

## 2019-11-21 MED ORDER — PANTOPRAZOLE SODIUM 40 MG PO TBEC
40.0000 mg | DELAYED_RELEASE_TABLET | Freq: Two times a day (BID) | ORAL | Status: DC
  Administered 2019-11-21 – 2019-11-23 (×6): 40 mg via ORAL
  Filled 2019-11-21 (×7): qty 1

## 2019-11-21 MED ORDER — METOPROLOL SUCCINATE ER 25 MG PO TB24
25.0000 mg | ORAL_TABLET | Freq: Every day | ORAL | Status: DC
  Administered 2019-11-21 – 2019-11-23 (×3): 25 mg via ORAL
  Filled 2019-11-21 (×3): qty 1

## 2019-11-21 MED ORDER — LOSARTAN POTASSIUM 25 MG PO TABS
12.5000 mg | ORAL_TABLET | Freq: Every day | ORAL | Status: DC
  Administered 2019-11-21 – 2019-11-23 (×3): 12.5 mg via ORAL
  Filled 2019-11-21: qty 1
  Filled 2019-11-21: qty 0.5
  Filled 2019-11-21 (×2): qty 1

## 2019-11-21 NOTE — Evaluation (Signed)
Clinical/Bedside Swallow Evaluation Patient Details  Name: Kenneth [name] MRN: [id number] Date of Birth: 1928/06/29  Today's Date: 11/21/2019 Time: SLP Start Time (ACUTE ONLY): 1545 SLP Stop Time (ACUTE ONLY): 1600 SLP Time Calculation (min) (ACUTE ONLY): 15 min  Past Medical History:  Past Medical History:  Diagnosis Date  . Anemia    on iron  . Anxiety   . Arthritis   . Basal cell carcinoma    left ear  . CAD (coronary artery disease)    a. CAD s/p CABG 1993.  [name] Chronic cough   . Chronic rhinitis   . Complication of anesthesia   . Diabetes ([place])   . Diverticulosis of colon   . GERD (gastroesophageal reflux disease)   . Glaucoma   . H/O hiatal hernia   . Hernia    left side  . History of gout   . History of kidney stones   . HTN (hypertension)   . Hyperlipidemia `  . Melanoma of skin, site unspecified   . PONV (postoperative nausea and vomiting)   . [facility] spotted fever 2-3 yrs ago  . Thyroid disease    Past Surgical History:  Past Surgical History:  Procedure Laterality Date  . COLONOSCOPY WITH PROPOFOL N/A 11/27/2013   Procedure: COLONOSCOPY WITH PROPOFOL;  Surgeon: Garlan Fair, MD;  Location: [facility];  Service: Endoscopy;  Laterality: N/A;  . COLOSTOMY TAKEDOWN N/A 03/16/2016   Procedure: COLOSTOMY TAKEDOWN;  Surgeon: Excell Seltzer, MD;  Location: [facility];  Service: General;  Laterality: N/A;  . CORONARY ARTERY BYPASS GRAFT  1990's   x 3  . CORONARY/GRAFT ACUTE MI REVASCULARIZATION N/A 11/12/2019   Procedure: CORONARY/GRAFT ACUTE MI REVASCULARIZATION;  Surgeon: Troy Sine, MD;  Location: [place] [facility];  Service: Cardiovascular;  Laterality: N/A;  . HERNIA REPAIR  1970's,15   lft ing hernia  . INGUINAL HERNIA REPAIR Left 06/28/2014   Procedure: Left  INGUINAL HERNIA REPAIR ;  Surgeon: Merrie Roof, MD;  Location: [facility];  Service: General;  Laterality: Left;  . INSERTION OF MESH Left 06/28/2014   Procedure: INSERTION OF MESH;   Surgeon: Merrie Roof, MD;  Location: [facility];  Service: General;  Laterality: Left;  . INSERTION OF MESH N/A 10/11/2014  . IR CT HEAD LTD  11/19/2019  . IR PERCUTANEOUS ART THROMBECTOMY/INFUSION INTRACRANIAL INC DIAG ANGIO  11/19/2019  . LAPAROSCOPY  10/11/2014  . LAPAROTOMY N/A 09/11/2015   Procedure: EXPLORATORY LAPAROTOMY;  Surgeon: Excell Seltzer, MD;  Location: [facility];  Service: General;  Laterality: N/A;  . MASS EXCISION Left 03/21/2017   Procedure: EXCISION BASAL CELL CARCINOMA LEFT EAR;  Surgeon: Leta Baptist, MD;  Location: [place];  Service: ENT;  Laterality: Left;  . PARTIAL COLECTOMY N/A 09/11/2015   Procedure: SIGMOID COLECTOMY AND COLOSTOMY;  Surgeon: Excell Seltzer, MD;  Location: [facility];  Service: General;  Laterality: N/A;  . RADIOLOGY WITH ANESTHESIA N/A 11/19/2019   Procedure: RADIOLOGY WITH ANESTHESIA;  Surgeon: Luanne Bras, MD;  Location: [place];  Service: Radiology;  Laterality: N/A;  . right leg fracture age 58    . SKIN FULL THICKNESS GRAFT Left 03/21/2017   Procedure: FULL THICKNESS SKIN GRAFT TO LEFT EAR;  Surgeon: Leta Baptist, MD;  Location: [place];  Service: ENT;  Laterality: Left;  . TONSILLECTOMY  age 89 or 30  . VENTRAL HERNIA REPAIR  10/11/2014   Procedure: HERNIA REPAIR VENTRAL ADULT;  Surgeon: Autumn Messing III,  MD;  Location: [facility];  Service: General;;   HPI:  Pt is an 85 y/o male with h/o HTN, gout, glaucoma, DM, and recently admitted for STEMI with stenting of the venous graft supplying the PDA.  Prior to this admission, pt had trouble getting OOB and inability to move L side.  On arrival of pt's dtr to his home, he had fallen trying to get OOB with his wife's assist.  Due to dtr noting that pt could not move his left side, she called EMS.  TPA given on arrival.  Pt underwent revascularization 12/14.  Post intervention in IR, imaging shows small foci of infarction in the right hippocampus, R occipital lobe, L frontal and  occipital lobes, bilateral cerebellum and the R thalamus.  No prior dysphagia or cognitive-linguistic deficits documented.     Assessment / Plan / Recommendation Clinical Impression  Pt was seen for a bedside swallow evaluation secondary to pt report of "choking with water" and difficulty masticating regular solids.  Oral mech exam revealed generalized weakness; otherwise it was unremarkable. Pt was observed with trials of thin liquid and regular solids.  He required set up and he benefited from extra time during self feeding.  Pt with good bolus acceptance and timely AP transport of thin liquids.  He intermittently was observed to swish liquid in his oral cavity prior to swallow initiation.  Pt exhibited a delayed throat clear following 1/20 thin liquid trials; otherwise no clinical s/sx of aspiration were observed with thin liquid or regular solids.  Pt exhibited prolonged mastication of regular solids and he required a liquid wash to clear trace oral residue.  Pt reported xerostomia and stated that he sometimes feels short of breath when eating or drinking.  Respiratory rate was slightly elevated during po consumption (mid-high 20s) and oxygen saturation remained between 99 and 100.  Recommend Dysphagia 3 (soft) solids and thin liquids with the following recommendations: 1) Small bites/sips 2) Slow rate of intake 3) Sit upright 90 degrees 4) Take a break from po intake if SOB.  SLP will f/u to monitor diet tolerance.    SLP Visit Diagnosis: Dysphagia, oral phase (R13.11)    Aspiration Risk  Mild aspiration risk    Diet Recommendation Dysphagia 3 (Mech soft);Thin liquid   Liquid Administration via: Cup;Straw Medication Administration: Whole meds with liquid Supervision: Staff to assist with self feeding;Intermittent supervision to cue for compensatory strategies Compensations: Minimize environmental distractions;Slow rate;Small sips/bites Postural Changes: Seated upright at 90 degrees    Other   Recommendations Oral Care Recommendations: Oral care BID   Follow up Recommendations Home health SLP;24 hour supervision/assistance      Frequency and Duration min 2x/week  2 weeks       Prognosis Prognosis for Safe Diet Advancement: Good Barriers to Reach Goals: Cognitive deficits      Swallow Study   General HPI: Pt is an 85 y/o male with h/o HTN, gout, glaucoma, DM, and recently admitted for STEMI with stenting of the venous graft supplying the PDA.  Prior to this admission, pt had trouble getting OOB and inability to move L side.  On arrival of pt's dtr to his home, he had fallen trying to get OOB with his wife's assist.  Due to dtr noting that pt could not move his left side, she called EMS.  TPA given on arrival.  Pt underwent revascularization 12/14.  Post intervention in IR, imaging shows small foci of infarction in the right hippocampus, R occipital lobe, L  frontal and occipital lobes, bilateral cerebellum and the R thalamus.  No prior dysphagia or cognitive-linguistic deficits documented.   Type of Study: Bedside Swallow Evaluation Previous Swallow Assessment: none Diet Prior to this Study: Regular;Thin liquids Temperature Spikes Noted: No Respiratory Status: Nasal cannula History of Recent Intubation: Yes Length of Intubations (days): (less than one (intubated for procedure) ) Date extubated: 11/19/19 Behavior/Cognition: Alert;Cooperative;Confused;Impulsive;Pleasant mood Oral Cavity Assessment: Within Functional Limits Oral Care Completed by SLP: No Oral Cavity - Dentition: Adequate natural dentition(partial dentures ) Vision: Functional for self-feeding Self-Feeding Abilities: Needs assist;Needs set up Patient Positioning: Upright in bed Baseline Vocal Quality: Normal Volitional Swallow: Able to elicit    Oral/Motor/Sensory Function Overall Oral Motor/Sensory Function: Generalized oral weakness Facial ROM: Within Functional Limits Facial Symmetry: Within Functional  Limits Lingual ROM: Reduced right;Reduced left Lingual Symmetry: Within Functional Limits   Ice Chips Ice chips: Not tested   Thin Liquid Thin Liquid: Impaired Presentation: Cup;Straw;Self Fed Pharyngeal  Phase Impairments: Throat Clearing - Delayed    Nectar Thick Nectar Thick Liquid: Not tested   Honey Thick Honey Thick Liquid: Not tested   Puree Puree: Not tested   Solid     Solid: Impaired Presentation: Self Fed Oral Phase Impairments: Impaired mastication Oral Phase Functional Implications: Impaired mastication;Prolonged oral transit;Oral residue     Colin Mulders M.S., CCC-SLP Acute Rehabilitation Services Office: [phone]  Elvia Collum Beckey Polkowski 11/21/2019,4:18 PM

## 2019-11-21 NOTE — Progress Notes (Signed)
STROKE TEAM PROGRESS NOTE   INTERVAL HISTORY Pt lying in bed, more awake alert and interactive today, still has mild SOB, CXR yesterday no acute disease. Cardiology consult appreciated, no imdur now. Off brilinta and now on plavix. BP better, will resume metoprolol and low dose ARB.    Vitals:   11/21/19 0400 11/21/19 0500 11/21/19 0600 11/21/19 0800  BP: 110/86 124/67 118/75   Pulse: (!) 117 (!) 101 (!) 122   Resp: 16 (!) 33 16   Temp: 98.2 F (36.8 C)   98.2 F (36.8 C)  TempSrc: Axillary   Oral  SpO2: 98% 98% 95%   Weight:      Height:        CBC:  Recent Labs  Lab 11/19/19 0432 11/20/19 0500 11/20/19 1411 11/21/19 0445  WBC 8.2 8.3  --  11.4*  NEUTROABS 6.3 7.3  --   --   HGB 10.4* 7.7* 8.9* 8.8*  HCT 33.8* 24.9* 28.7* 27.6*  MCV 101.2* 100.0  --  97.5  PLT 679* 518*  --  569*    Basic Metabolic Panel:  Recent Labs  Lab 11/20/19 0500 11/21/19 0445  NA 140 144  K 4.1 4.2  CL 111 112*  CO2 21* 19*  GLUCOSE 178* 115*  BUN 16 25*  CREATININE 0.84 0.81  CALCIUM 8.4* 8.5*   Lipid Panel:     Component Value Date/Time   CHOL 81 11/20/2019 0500   TRIG 67 11/20/2019 0500   HDL 32 (L) 11/20/2019 0500   CHOLHDL 2.5 11/20/2019 0500   VLDL 13 11/20/2019 0500   LDLCALC 36 11/20/2019 0500   HgbA1c:  Lab Results  Component Value Date   HGBA1C 6.9 (H) 11/20/2019   Urine Drug Screen:     Component Value Date/Time   LABOPIA NONE DETECTED 11/19/2019 0900   COCAINSCRNUR NONE DETECTED 11/19/2019 0900   LABBENZ NONE DETECTED 11/19/2019 0900   AMPHETMU NONE DETECTED 11/19/2019 0900   THCU NONE DETECTED 11/19/2019 0900   LABBARB NONE DETECTED 11/19/2019 0900    Alcohol Level     Component Value Date/Time   ETH <10 11/19/2019 0432    IMAGING  DG CHEST PORT 1 VIEW 11/20/2019 No acute disease. Cardiomegaly. Atherosclerosis. Hiatal hernia.    MR BRAIN WO CONTRAST MR MRA HEAD WO CONTRAST 11/19/2019 1419 Several small foci of acute infarction as described,  likely embolic. No evidence of hemorrhage. Compared to prior CTA, there is improved flow within the intracranial right vertebral artery, distal basilar artery, and right posterior cerebral artery. Chronic microvascular ischemic changes.   ECHOCARDIOGRAM COMPLETE 11/19/2019 1. Left ventricular ejection fraction, by visual estimation, is 20 to 25%. The left ventricle has normal function. There is no left ventricular hypertrophy.  2. Left ventricular diastolic parameters are indeterminate.  3. The left ventricle demonstrates global hypokinesis.  4. COmpared to previous echo on 11/12/19, LVEF is unchanged.  5. Global right ventricle mild to moderately reduced..The right ventricular size is normal. No increase in right ventricular wall thickness.  6. Left atrial size was moderately dilated.  7. Right atrial size was normal.  8. The mitral valve is grossly normal. Moderate mitral valve regurgitation.  9. The tricuspid valve is normal in structure. Tricuspid valve regurgitation is trivial. 10. The aortic valve is tricuspid. Aortic valve regurgitation is not visualized. Mild aortic valve stenosis. 11. The pulmonic valve was normal in structure. Pulmonic valve regurgitation is not visualized. 12. The aortic root was not well visualized. 13. The inferior vena cava  is normal in size with greater than 50% respiratory variability, suggesting right atrial pressure of 3 mmHg.     Cerebral Angio 11/19/2019 S/P Lt VA angiogram followed by complete revascularization of basilar artery with x 1 pass with 74mm x 40 mm solitaire X retriever device and penumbra aspiration achieving a TICI 3 revascularization. 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  CT C-SPINE NO CHARGE 11/19/2019 0654 No evidence of cervical spine injury.   CT Code Stroke CTA Head W/WO contrast CT Code Stroke CTA Neck W/WO contrast CT Code Stroke Cerebral Perfusion with contrast 11/19/2019 0524 1. Distal basilar occlusion with minimal thready  flow in the right PCA distribution where there is 18 cc of penumbra. 2. Severe right supraclinoid ICA stenosis with right MCA branch underfilling, chronic appearing 3. High-grade narrowing at the left vertebrobasilar junction. No meaningful contribution to the basilar by the non dominant right vertebral artery. 4. Heavily calcified carotid bifurcations, especially on the left where ICA stenosis measures 75%.   CT HEAD CODE STROKE WO CONTRAST 11/19/2019 0457 Aging brain without acute or reversible finding.    PHYSICAL EXAM  Temp:  [98.2 F (36.8 C)-98.6 F (37 C)] 98.2 F (36.8 C) (12/16 0800) Pulse Rate:  [73-122] 122 (12/16 0600) Resp:  [14-36] 16 (12/16 0600) BP: (99-131)/(48-86) 118/75 (12/16 0600) SpO2:  [93 %-100 %] 95 % (12/16 0600)  General - Well nourished, well developed, mild SOB.  Ophthalmologic - fundi not visualized due to noncooperation.  Cardiovascular - regular heart rate and rhythm with frequent PCAs and PVCs.  Mental Status -  Level of arousal and orientation to time, place, and person were intact. Language including expression, naming, repetition, comprehension was assessed and found intact.  Cranial Nerves II - XII - II - Visual field intact OU. III, IV, VI - Extraocular movements intact. V - Facial sensation intact bilaterally. VII - Facial movement intact bilaterally. VIII - Hearing & vestibular intact bilaterally. X - Palate elevates symmetrically. XI - Chin turning & shoulder shrug intact bilaterally. XII - Tongue protrusion intact.  Motor Strength - The patient's strength was symmetrical in all extremities and pronator drift was absent.  Bulk was normal and fasciculations were absent.   Motor Tone - Muscle tone was assessed at the neck and appendages and was normal.  Reflexes - The patient's reflexes were symmetrical in all extremities and he had no pathological reflexes.  Sensory - Light touch, temperature/pinprick were assessed and were  symmetrical.    Coordination - The patient had normal movements in the hands with no ataxia or dysmetria.  Tremor was absent.  Gait and Station - deferred.   ASSESSMENT/PLAN Mr. LAQUENTIN PREGLER is a 85 y.o. male with history of recent STEMI s/p stenting who was d/c home 12/11, developing L sided weakness. Found to have a BA occlusion and sent to IR.    Stroke: small scattered B cerebellar, right PCA, left MCA and MCA/PCA, right thalamus in setting of BA and right PCA occlusion s/p IR with TICI3 reperfusion and intra/extracranial stenosis, likely secondary to large vessel disease with hypotension and CHF, vs. cardioembolic source with low EF and possible occult afib  Code Stroke CT head No acute abnormality.  CTA head & neck distal BA occlusion w/ thready flow R PCA w/ 18cc penumbra. Severe R supraclinoid ICA stenosis w/ R MCA branch underfilling (chronic). High-grade narrowing L VBJ. Heavily calcified ICA bifurcations, L 75%.  CT perfusion R PCA w/ 18cc penumbra  Cerebral angio  TICI 3 revascularization of BA. 50-60% stenosis proximal BA.  Post IR CT NO ICH or mass effect.   MRI  Several small foci acute infarct as above. Small vessel disease.  MRA  Improved flow intracranial R VA, distal BA, R PCA  2D Echo  EF 20-25% with no SOE, unchanged since 11/12/19  LDL 36  HgbA1c 6.9  SCDs for VTE prophylaxis  Brilinta (ticagrelor) 90 mg bid prior to admission, now on plavix 75 following load. Off ASA due to hx of severe GIB. Pt not good anticoagulation at this time due to hx of severe GIB and need of plavix for cardiac stent. Off Brilinta d/t SOB following administration.  Therapy recommendations:  pending   Disposition:  pending (caregiver for wife who has dementia)  Recent STEMI s/p stenting  11/12/19 admitted by cardiology  Cardiac cath - severe multivessle CAD  S/p stenting RCA  On plavix only due to hx of GIB (changed from Brilinta d/t SOB, no ASA)  Hx of CABG 1993  EKG  unchanged  Trop 903 - much improved  Cardiology consult appreciated  Severe intracranial atherosclerosis  CTA head and neck showed Severe R supraclinoid ICA stenosis w/ R MCA branch underfilling (chronic). Severe left ICA siphon high grade stenosis. High-grade narrowing L VBJ. Heavily calcified ICA bifurcations bilaterally L>R, with L 75%.  On DAPT  Avoid low BP  Long-term BP goal 120-150 given severe intracranial stenosis  Cardiomyopathy  Chronic Systolic HF  EF [id number]  Current TTE EF 20-25%  Cardiology consult appreciate  Brilinta changed to plavix as SOB following Brilinta administration  Hold lasix   Resume imdur, metoprolol, and low dose losartran  CXR no acute disease 11/20/19  Hx of GIB with anemia  Recent diverticulitis and large HH w/ hx colectomy 2-3 yrs ago, treated with cipro and flagyl. d/c 11/04/2019  Hx remote GIB on ASA  Hb 8.5 on 11/14/19  No ASA as per cardiology post stenting due to hx of GIB  Hb 11.2->7.7->8.9->8.8   Continue brilinta, d/c ASA  Guaiac stool negative   Transfuse if Hb < 7.0  Hx if hypertension Hypotension   Home meds:  imdur 60, losartan 12.5, metoprolol 25  Stable today  Resume home meds - imdur, losartan and metoprolol  Hold off lasix . Long-term BP goal 120-150 given severe intracranial stenosis  Hyperlipidemia  Home meds:  crestor 20, resumed in hospital  LDL 50, goal < 70  Continue statin at discharge  Diabetes type II Controlled  Home meds:  Metformin 500 acb  HgbA1c 6.8, goal < 7.0  CBGs  SSI  Glucose stable   Other Stroke Risk Factors  Advanced age  Former Cigarette smoker  ETOH use, alcohol level <10, advised to drink no more than 2 drink(s) a day  Other Active Problems  Hypothyroid on synthroid  C-collar cleared after CT C-spine and pain free neck movement   Leukocytosis 8.3->11.4 - continue monitoring   Thrombocytosis PLT 518->569  Hospital day # 2  This patient  is critically ill due to  and at significant risk of neurological worsening, death form BA occlusion, severe intracranial stenosis, recent STEMI, low EF, hx of CABG and GIB, CHF, severe anemia. This patient's care requires constant monitoring of vital signs, hemodynamics, respiratory and cardiac monitoring, review of multiple databases, neurological assessment, discussion with family, other specialists and medical decision making of high complexity. I spent 35 minutes of neurocritical care time in the care of this patient.  Rosalin Hawking, MD  PhD Stroke Neurology 11/21/2019 8:47 AM    To contact Stroke Continuity provider, please refer to [email or web address] After hours, contact General Neurology

## 2019-11-21 NOTE — Progress Notes (Signed)
Progress Note  Patient Name: Kenneth [name] Date of Encounter: 11/21/2019  Primary Cardiologist: Sinclair Grooms, MD   Subjective   Admitted for right brain stroke treated with extraction thrombectomy with dramatic improvement in left hemiparesis.  Noted to be dyspneic yesterday.  Concern was for CHF or STEMI.  After evaluation neither was felt to be present.  Hyperpnea felt likely related to [place].  Inpatient Medications    Scheduled Meds: .  stroke: mapping our early stages of recovery book   Does not apply Once  . Chlorhexidine Gluconate Cloth  6 each Topical Q0600  . clopidogrel  75 mg Oral Daily  . [START ON 11/22/2019] ferrous sulfate  325 mg Oral Q breakfast  . insulin aspart  0-15 Units Subcutaneous TID WC & HS  . isosorbide mononitrate  60 mg Oral QPM  . latanoprost  1 drop Both Eyes QHS  . levothyroxine  50 mcg Oral Q0600  . losartan  12.5 mg Oral Daily  . metoprolol succinate  25 mg Oral Daily  . pantoprazole  40 mg Oral BID  . rosuvastatin  20 mg Oral q1800  . senna-docusate  1 tablet Oral Once   Continuous Infusions:  PRN Meds: acetaminophen **OR** acetaminophen (TYLENOL) oral liquid 160 mg/5 mL **OR** acetaminophen   Vital Signs    Vitals:   11/21/19 0400 11/21/19 0500 11/21/19 0600 11/21/19 0800  BP: 110/86 124/67 118/75   Pulse: (!) 117 (!) 101 (!) 122   Resp: 16 (!) 33 16   Temp: 98.2 F (36.8 C)   98.2 F (36.8 C)  TempSrc: Axillary   Oral  SpO2: 98% 98% 95%   Weight:      Height:        Intake/Output Summary (Last 24 hours) at 11/21/2019 1059 Last data filed at 11/20/2019 1800 Gross per 24 hour  Intake 480 ml  Output --  Net 480 ml   Last 3 Weights 11/19/2019 11/16/2019 11/15/2019  Weight (lbs) 149 lb 14.6 oz 148 lb 1.9 oz 151 lb 12.8 oz  Weight (kg) 68 kg 67.187 kg 68.856 kg      Telemetry    Sinus rhythm- Personally Reviewed  ECG    No new data- Personally Reviewed  Physical Exam  Frail-appearing compared with  historical GEN: No acute distress.   Neck: No JVD Cardiac:  2/6 systolic Respiratory: Clear to auscultation bilaterally. GI: Soft, nontender, non-distended  MS: No edema; No deformity. Neuro:  Nonfocal  Psych: Normal affect   Labs    High Sensitivity Troponin:   Recent Labs  Lab 11/12/19 1045 11/12/19 1327 11/12/19 1448 11/20/19 1411  TROPONINIHS 8,449* 10,700* 9,411* 903*      Chemistry Recent Labs  Lab 11/19/19 0432 11/19/19 0512 11/19/19 0646 11/20/19 0500 11/21/19 0445  NA 141 141 141 140 144  K 4.1 4.2 3.9 4.1 4.2  CL 108 107  --  111 112*  CO2 20*  --   --  21* 19*  GLUCOSE 177* 168*  --  178* 115*  BUN 13 16  --  16 25*  CREATININE 0.96 0.80  --  0.84 0.81  CALCIUM 8.8*  --   --  8.4* 8.5*  PROT 6.5  --   --   --   --   ALBUMIN 2.7*  --   --   --   --   AST 22  --   --   --   --   ALT 14  --   --   --   --  ALKPHOS 66  --   --   --   --   BILITOT 0.4  --   --   --   --   GFRNONAA >60  --   --  >60 >60  GFRAA >60  --   --  >60 >60  ANIONGAP 13  --   --  8 13     Hematology Recent Labs  Lab 11/19/19 0432 11/20/19 0500 11/20/19 1411 11/21/19 0445  WBC 8.2 8.3  --  11.4*  RBC 3.34* 2.49*  --  2.83*  HGB 10.4* 7.7* 8.9* 8.8*  HCT 33.8* 24.9* 28.7* 27.6*  MCV 101.2* 100.0  --  97.5  MCH 31.1 30.9  --  31.1  MCHC 30.8 30.9  --  31.9  RDW 13.8 13.9  --  14.0  PLT 679* 518*  --  569*    BNPNo results for input(s): BNP, PROBNP in the last 168 hours.   DDimer No results for input(s): DDIMER in the last 168 hours.   Radiology    MR MRA HEAD WO CONTRAST  Result Date: 11/19/2019 CLINICAL DATA:  Stroke, follow-up EXAM: MRI HEAD WITHOUT CONTRAST MRA HEAD WITHOUT CONTRAST TECHNIQUE: Multiplanar, multiecho pulse sequences of the brain and surrounding structures were obtained without intravenous contrast. Angiographic images of the head were obtained using MRA technique without contrast. COMPARISON:  Recent CT imaging FINDINGS: MRI HEAD FINDINGS Brain:  There are multiple small foci of reduced diffusion including involvement of the right hippocampus, right occipital lobe, left frontal lobe, left occipital lobe, bilateral cerebellum, and possibly the right thalamus. No evidence of intracranial hemorrhage. Prominence of the ventricles and sulci reflects generalized parenchymal volume loss. Patchy and confluent areas of T2 hyperintensity in the supratentorial white matter are nonspecific but probably reflect moderate chronic microvascular ischemic changes. There are chronic small vessel infarcts of the basal ganglia. There is no intracranial mass or mass effect. No extra-axial fluid collection. Vascular: Major vessel flow voids at the skull base are preserved. Skull and upper cervical spine: Normal marrow signal. Sinuses/Orbits: Minor paranasal sinus mucosal thickening. Bilateral lens replacements. Other: Sella is unremarkable. MRA HEAD FINDINGS There is preserved flow related enhancement of the intracranial internal carotid arteries. Atherosclerotic irregularity is present along the cavernous and paraclinoid portions causing moderate stenosis. Proximal anterior and middle cerebral arteries are patent. Intracranial vertebral arteries are patent with atherosclerotic irregularity. The left vertebral artery is dominant. There appears to be splitting of the distal right vertebral artery. Flow within the right vertebral artery appears improved relative to the CTA. There is preserved flow related enhancement within the basilar artery and the proximal posterior cerebral arteries. A left posterior communicating arteries again identified. IMPRESSION: Several small foci of acute infarction as described, likely embolic. No evidence of hemorrhage. Compared to prior CTA, there is improved flow within the intracranial right vertebral artery, distal basilar artery, and right posterior cerebral artery. Chronic microvascular ischemic changes. Electronically Signed   By: Macy Mis  M.D.   On: 11/19/2019 14:19   MR BRAIN WO CONTRAST  Result Date: 11/19/2019 CLINICAL DATA:  Stroke, follow-up EXAM: MRI HEAD WITHOUT CONTRAST MRA HEAD WITHOUT CONTRAST TECHNIQUE: Multiplanar, multiecho pulse sequences of the brain and surrounding structures were obtained without intravenous contrast. Angiographic images of the head were obtained using MRA technique without contrast. COMPARISON:  Recent CT imaging FINDINGS: MRI HEAD FINDINGS Brain: There are multiple small foci of reduced diffusion including involvement of the right hippocampus, right occipital lobe, left frontal lobe, left occipital  lobe, bilateral cerebellum, and possibly the right thalamus. No evidence of intracranial hemorrhage. Prominence of the ventricles and sulci reflects generalized parenchymal volume loss. Patchy and confluent areas of T2 hyperintensity in the supratentorial white matter are nonspecific but probably reflect moderate chronic microvascular ischemic changes. There are chronic small vessel infarcts of the basal ganglia. There is no intracranial mass or mass effect. No extra-axial fluid collection. Vascular: Major vessel flow voids at the skull base are preserved. Skull and upper cervical spine: Normal marrow signal. Sinuses/Orbits: Minor paranasal sinus mucosal thickening. Bilateral lens replacements. Other: Sella is unremarkable. MRA HEAD FINDINGS There is preserved flow related enhancement of the intracranial internal carotid arteries. Atherosclerotic irregularity is present along the cavernous and paraclinoid portions causing moderate stenosis. Proximal anterior and middle cerebral arteries are patent. Intracranial vertebral arteries are patent with atherosclerotic irregularity. The left vertebral artery is dominant. There appears to be splitting of the distal right vertebral artery. Flow within the right vertebral artery appears improved relative to the CTA. There is preserved flow related enhancement within the  basilar artery and the proximal posterior cerebral arteries. A left posterior communicating arteries again identified. IMPRESSION: Several small foci of acute infarction as described, likely embolic. No evidence of hemorrhage. Compared to prior CTA, there is improved flow within the intracranial right vertebral artery, distal basilar artery, and right posterior cerebral artery. Chronic microvascular ischemic changes. Electronically Signed   By: Macy Mis M.D.   On: 11/19/2019 14:19   DG CHEST PORT 1 VIEW  Result Date: 11/20/2019 CLINICAL DATA:  Lethargy.  Dysarthria. EXAM: PORTABLE CHEST 1 VIEW COMPARISON:  Single-view of the chest 09/10/2017. FINDINGS: The patient is status post CABG. Heart size is upper normal. No edema or consolidative process. Large hiatal hernia is again seen. No pneumothorax or pleural effusion. No acute or focal bony abnormality. IMPRESSION: No acute disease. Cardiomegaly. Atherosclerosis. Hiatal hernia. Electronically Signed   By: Inge Rise M.D.   On: 11/20/2019 14:36    Cardiac Studies   No new cardiac database  Patient Profile     85 y.o. male with a hx of CAD status post remote CABG with recent stenting of SVG to PDA (12/08), hypothyroidism, hypertension, hyperlipidemia, diabetes and GI bleed who is being seen today for the evaluation of CHF, blood pressure management at the request of Dr. Erlinda Hong.  Assessment & Plan    1. Chronic combined systolic and diastolic heart failure: Not felt to have significant volume overload when evaluated yesterday.  We requested a BNP but it has not been performed. 2. Hyperpnea noted yesterday likely related to [place].  He was switched to clopidogrel yesterday after we noted decreasing hemoglobin.       For questions or updates, please contact [place] Please consult [email or web address] for contact info under        Signed, Sinclair Grooms, MD  11/21/2019, 10:59 AM

## 2019-11-21 NOTE — Evaluation (Signed)
Speech Language Pathology Evaluation Patient Details Name: Kenneth [name] MRN: [id number] DOB: 1928-05-10 Today's Date: 11/21/2019 Time: [id number] SLP Time Calculation (min) (ACUTE ONLY): 35 min  Problem List:  Patient Active Problem List   Diagnosis Date Noted  . Hx of CABG   . Pure hypercholesterolemia   . Acute embolic stroke ([place]) 0000000  . 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 Acute systolic heart failure ([place]) 11/16/2019  . Acute ST elevation myocardial infarction (STEMI) of posterior wall ([place]) 11/12/2019  . ST elevation myocardial infarction involving right coronary artery ([place])   . Diverticulitis 11/02/2019  . Colostomy status ([place]) 03/16/2016  . Elevated troponin 09/13/2015  . Hemoptysis   . Nausea and vomiting 09/09/2015  . Small bowel obstruction ([place]) 09/09/2015  . Hypertensive urgency 09/09/2015  . Diabetes mellitus ([place]) 09/09/2015  . Colonic mass   . Intestinal obstruction ([place])   . Preop cardiovascular exam 06/27/2014  . Left inguinal hernia 06/26/2014  . Dysphagia 09/28/2013    Class: Chronic  . Abnormal weight loss 09/28/2013    Class: Chronic  . HTN (hypertension)   . Coronary artery disease involving coronary bypass graft of native heart without angina pectoris   . Hyperlipidemia with target LDL less than 70   . GERD (gastroesophageal reflux disease)   . Nephrolithiasis   . Melanoma of skin, site unspecified   . Glaucoma    Past Medical History:  Past Medical History:  Diagnosis Date  . Anemia    on iron  . Anxiety   . Arthritis   . Basal cell carcinoma    left ear  . CAD (coronary artery disease)    a. CAD s/p CABG 1993.  [name] Chronic cough   . Chronic rhinitis   . Complication of anesthesia   . Diabetes ([place])   . Diverticulosis of colon   . GERD (gastroesophageal reflux disease)   . Glaucoma   . H/O hiatal hernia   . Hernia    left side  . History of gout   . History of kidney stones   . HTN (hypertension)   . Hyperlipidemia  `  . Melanoma of skin, site unspecified   . PONV (postoperative nausea and vomiting)   . [facility] spotted fever 2-3 yrs ago  . Thyroid disease    Past Surgical History:  Past Surgical History:  Procedure Laterality Date  . COLONOSCOPY WITH PROPOFOL N/A 11/27/2013   Procedure: COLONOSCOPY WITH PROPOFOL;  Surgeon: Garlan Fair, MD;  Location: [facility];  Service: Endoscopy;  Laterality: N/A;  . COLOSTOMY TAKEDOWN N/A 03/16/2016   Procedure: COLOSTOMY TAKEDOWN;  Surgeon: Excell Seltzer, MD;  Location: [facility] OR[facility];  Service: General;  Laterality: N/A;  . CORONARY ARTERY BYPASS GRAFT  1990's   x 3  . CORONARY/GRAFT ACUTE MI REVASCULARIZATION N/A 11/12/2019   Procedure: CORONARY/GRAFT ACUTE MI REVASCULARIZATION;  Surgeon: Troy Sine, MD;  Location: [place] [facility] LAB;  Service: Cardiovascular;  Laterality: N/A;  . HERNIA REPAIR  1970's,15   lft ing hernia  . INGUINAL HERNIA REPAIR Left 06/28/2014   Procedure: Left  INGUINAL HERNIA REPAIR ;  Surgeon: Merrie Roof, MD;  Location: [facility];  Service: General;  Laterality: Left;  . INSERTION OF MESH Left 06/28/2014   Procedure: INSERTION OF MESH;  Surgeon: Merrie Roof, MD;  Location: [facility];  Service: General;  Laterality: Left;  . INSERTION OF MESH N/A 10/11/2014  . IR CT HEAD LTD  11/19/2019  . IR PERCUTANEOUS  ART THROMBECTOMY/INFUSION INTRACRANIAL INC DIAG ANGIO  11/19/2019  . LAPAROSCOPY  10/11/2014  . LAPAROTOMY N/A 09/11/2015   Procedure: EXPLORATORY LAPAROTOMY;  Surgeon: Excell Seltzer, MD;  Location: [facility];  Service: General;  Laterality: N/A;  . MASS EXCISION Left 03/21/2017   Procedure: EXCISION BASAL CELL CARCINOMA LEFT EAR;  Surgeon: Leta Baptist, MD;  Location: [place];  Service: ENT;  Laterality: Left;  . PARTIAL COLECTOMY N/A 09/11/2015   Procedure: SIGMOID COLECTOMY AND COLOSTOMY;  Surgeon: Excell Seltzer, MD;  Location: [facility];  Service: General;  Laterality: N/A;  . RADIOLOGY WITH ANESTHESIA  N/A 11/19/2019   Procedure: RADIOLOGY WITH ANESTHESIA;  Surgeon: Luanne Bras, MD;  Location: [place];  Service: Radiology;  Laterality: N/A;  . right leg fracture age 58    . SKIN FULL THICKNESS GRAFT Left 03/21/2017   Procedure: FULL THICKNESS SKIN GRAFT TO LEFT EAR;  Surgeon: Leta Baptist, MD;  Location: [place];  Service: ENT;  Laterality: Left;  . TONSILLECTOMY  age 6 or 42  . VENTRAL HERNIA REPAIR  10/11/2014   Procedure: HERNIA REPAIR VENTRAL ADULT;  Surgeon: Autumn Messing III, MD;  Location: [place];  Service: General;;   HPI:  Pt is an 85 y/o male with h/o HTN, gout, glaucoma, DM, and recently admitted for STEMI with stenting of the venous graft supplying the PDA.  Prior to this admission, pt had trouble getting OOB and inability to move L side.  On arrival of pt's dtr to his home, he had fallen trying to get OOB with his wife's assist.  Due to dtr noting that pt could not move his left side, she called EMS.  TPA given on arrival.  Pt underwent revascularization 12/14.  Post intervention in IR, imaging shows small foci of infarction in the right hippocampus, R occipital lobe, L frontal and occipital lobes, bilateral cerebellum and the R thalamus.  No prior dysphagia or cognitive-linguistic deficits documented.    Assessment / Plan / Recommendation Clinical Impression  Pt was seen for a cognitive-linguistic evaluation in the setting of bilateral infarcts.  He was encountered awake/alert with daughter present at bedside.  Daughter reported that prior to admission pt was living with his wife and that he was her caretaker as she has dementia.  She additionally stated that the pt was independent with IADLs (medications, finances, etc.) at that he was responsible for assisting his wife with her IADLs as well.  Pt was oriented to self, year, and month.  He was not initially oriented to place (stating that he was at [facility]), but he was able to correctly identify that he was in a hospital  given a choice of two.  Pt exhibited intermittent visual hallucinations and he was observed to have verbal perseveration.  He additionally exhibited a reduced rate of speech, which his daughter reported is an acute change.  Pt presents with cognitive deficits in the areas of short-term memory, attention, and executive functioning.  He additionally was observed to be impulsive with concerns for decreased safety awareness.  Pt did the best with money and time based problem solving question, answering them with 100% accuracy given extra time. Daughter reported that pt had worked as a Chief Financial Officer and that he felt very comfortable working with numbers.  Pt followed basic 1-3 step commands without difficulty and he completed confrontational naming tasks with 100% accuracy.  Pt exhibited some difficulty with repetition at the word and phrase level.  Recommend additional ST and 24/7 supervision  for safety at time of discharge.  SLP will follow up for cognitive-linguistic treatment per POC.     SLP Assessment  SLP Recommendation/Assessment: Patient needs continued Speech Lanaguage Pathology Services SLP Visit Diagnosis: Cognitive communication deficit (R41.841)    Follow Up Recommendations  Home health SLP;24 hour supervision/assistance    Frequency and Duration min 2x/week  2 weeks      SLP Evaluation Cognition  Overall Cognitive Status: Impaired/Different from baseline Arousal/Alertness: Awake/alert Orientation Level: Oriented to person;Oriented to time;Disoriented to place;Disoriented to situation Attention: Sustained Sustained Attention: Appears intact Memory: Impaired Memory Impairment: Decreased short term memory Decreased Short Term Memory: Verbal basic;Verbal complex Awareness: Impaired Awareness Impairment: Intellectual impairment Problem Solving: Impaired Problem Solving Impairment: Verbal complex Executive Function: Reasoning;Organizing Reasoning: Impaired Reasoning Impairment: Verbal  complex Organizing: Impaired Organizing Impairment: Verbal complex Behaviors: Impulsive;Perseveration Safety/Judgment: Impaired       Comprehension  Auditory Comprehension Overall Auditory Comprehension: Appears within functional limits for tasks assessed    Expression Expression Primary Mode of Expression: Verbal Verbal Expression Overall Verbal Expression: Impaired Initiation: No impairment Level of Generative/Spontaneous Verbalization: Conversation Repetition: Impaired Level of Impairment: Word level Naming: No impairment Written Expression Dominant Hand: Right   Oral / Motor  Oral Motor/Sensory Function Overall Oral Motor/Sensory Function: Generalized oral weakness Motor Speech Overall Motor Speech: Appears within functional limits for tasks assessed   GO                   Colin Mulders M.S., CCC-SLP Acute Rehabilitation Services Office: [phone]  [place] 11/21/2019, 4:10 PM

## 2019-11-21 NOTE — Progress Notes (Signed)
Arrived from 4N ICU. Alert and oriented x3, not situation. C/O of mild headache. Will administer tylenol.  Ramble on with speech and off topic. Call light within reach and bed in lowest position.

## 2019-11-21 NOTE — Evaluation (Addendum)
Physical Therapy Evaluation Patient Details Name: Kenneth [name] MRN: [id number] DOB: September 09, 1928 Today's Date: 11/21/2019   History of Present Illness  pt is an 85 y/o male with h/o HTN, gout, glaucoma, DM, and recently admitted for STEMIwith stenting of the venous graft supplying the PDA.  Prior to this admission, pt had trouble getting OOB and inability to move L side.  On arrival of pt's dtr to his home, he had fallen trying to get OOB with his wife's assist.  Due to dtr noting that pt could not move his left side, she calle EMS.  tPa given on arrival.  Pt underwent revascularization 12/14.  Post intervention in IR, imaging shows small foci of infarction in the right hippocampus, R occipital lobe, L frontal and occipital lobes, bilateral cerebellum and the R thalamus.  Clinical Impression  Pt admitted with/for s/s of stroke.  Post revascularization, pt needing supervision to min assist depending on activity.  Pt currently limited functionally due to the problems listed below.  (see problems list.)  Pt will benefit from PT to maximize function and safety to be able to get home safely with available assist.  .     Follow Up Recommendations Home health PT    Equipment Recommendations  None recommended by PT    Recommendations for Other Services       Precautions / Restrictions Precautions Precautions: Fall      Mobility  Bed Mobility Overal bed mobility: Needs Assistance Bed Mobility: Supine to Sit;Sit to Supine     Supine to sit: Supervision Sit to supine: Supervision   General bed mobility comments: no need for the rail, but extra time needed to move around the bed.  Transfers Overall transfer level: Needs assistance   Transfers: Sit to/from Stand Sit to Stand: Min assist         General transfer comment: assist to bring pt forward more than boost.  Ambulation/Gait Ambulation/Gait assistance: Min assist Gait Distance (Feet): 60 Feet(before pt became anxious,  SOB asking to sit before falling) Assistive device: Rolling walker (2 wheeled) Gait Pattern/deviations: Step-through pattern Gait velocity: decreased   General Gait Details: pt with flexed posture, too far distance behind the RW, generally steady wit moderate use of the RW.  Then pt fatigued, got SOB and anxious, wanting to sit immediately.  EHR in the 110's up from the 70's and 80''s  Stairs            Wheelchair Mobility    Modified Rankin (Stroke Patients Only) Modified Rankin (Stroke Patients Only) Pre-Morbid Rankin Score: No significant disability Modified Rankin: Moderately severe disability     Balance Overall balance assessment: Needs assistance Sitting-balance support: Single extremity supported;No upper extremity supported Sitting balance-Leahy Scale: Good     Standing balance support: During functional activity;Bilateral upper extremity supported;No upper extremity supported Standing balance-Leahy Scale: Fair Standing balance comment: able to balance at EOB without assist staticall, but needing AD for dynamics.                             Pertinent Vitals/Pain Pain Assessment: Faces Faces Pain Scale: No hurt    Home Living Family/patient expects to be discharged to:: Private residence Living Arrangements: Spouse/significant other Available Help at Discharge: Family;Available 24 hours/day Type of Home: House Home Access: Stairs to enter Entrance Stairs-Rails: None Entrance Stairs-Number of Steps: 2 steps from carport; 8 steps from back porch Home Layout: One level Home Equipment: Gilford Rile -  2 wheels Additional Comments: walker but does not use it, per pt report his wife has the beginning of dementia    Prior Function Level of Independence: Independent               Hand Dominance   Dominant Hand: Right    Extremity/Trunk Assessment   Upper Extremity Assessment Upper Extremity Assessment: Defer to OT evaluation    Lower Extremity  Assessment Lower Extremity Assessment: Defer to PT evaluation;Generalized weakness(right weaker than left LE)    Cervical / Trunk Assessment Cervical / Trunk Assessment: Kyphotic  Communication   Communication: HOH  Cognition Arousal/Alertness: Awake/alert Behavior During Therapy: WFL for tasks assessed/performed Overall Cognitive Status: Impaired/Different from baseline(NT formally, but pt is distractable and mildly disoriented.)                     Current Attention Level: Sustained   Following Commands: Follows one step commands with increased time Safety/Judgement: Decreased awareness of safety            General Comments General comments (skin integrity, edema, etc.): EHR 110's, SpO2 100%, RR 30'40's rpm, bp 124/66    Exercises     Assessment/Plan    PT Assessment Patient needs continued PT services  PT Problem List Decreased strength;Decreased activity tolerance;Decreased balance;Decreased mobility;Cardiopulmonary status limiting activity       PT Treatment Interventions DME instruction;Gait training;Functional mobility training;Therapeutic activities;Stair training;Balance training;Patient/family education;Neuromuscular re-education    PT Goals (Current goals can be found in the Care Plan section)  Acute Rehab PT Goals Patient Stated Goal: to go home PT Goal Formulation: With patient Time For Goal Achievement: 11/29/19 Potential to Achieve Goals: Good    Frequency Min 3X/week   Barriers to discharge        Co-evaluation               AM-PAC PT "6 Clicks" Mobility  Outcome Measure Help needed turning from your back to your side while in a flat bed without using bedrails?: None Help needed moving from lying on your back to sitting on the side of a flat bed without using bedrails?: None Help needed moving to and from a bed to a chair (including a wheelchair)?: None Help needed standing up from a chair using your arms (e.g., wheelchair or  bedside chair)?: A Little Help needed to walk in hospital room?: A Little Help needed climbing 3-5 steps with a railing? : A Little 6 Click Score: 21    End of Session   Activity Tolerance: Patient limited by fatigue;Patient tolerated treatment well Patient left: in bed;with call bell/phone within reach Nurse Communication: Mobility status PT Visit Diagnosis: Unsteadiness on feet (R26.81);History of falling (Z91.81);Other symptoms and signs involving the nervous system (R29.898)    Time: [id number] PT Time Calculation (min) (ACUTE ONLY): 26 min   Charges:   PT Evaluation $PT Eval Moderate Complexity: 1 Mod          11/21/2019  Ginger Carne., PT Acute Rehabilitation Services [phone]  (pager) [phone]  (office)  Tessie Fass Alessander Sikorski 11/21/2019, 12:40 PM

## 2019-11-21 NOTE — Telephone Encounter (Signed)
Pt readmitted to hospital 12/14

## 2019-11-21 NOTE — Evaluation (Signed)
Occupational Therapy Evaluation Patient Details Name: Kenneth [name] MRN: [id number] DOB: 1928/03/07 Today's Date: 11/21/2019    History of Present Illness pt is an 85 y/o male with h/o HTN, gout, glaucoma, DM, and recently admitted for STEMIwith stenting of the venous graft supplying the PDA.  Prior to this admission, pt had trouble getting OOB and inability to move L side.  On arrival of pt's dtr to his home, he had fallen trying to get OOB with his wife's assist.  Due to dtr noting that pt could not move his left side, she calle EMS.  tPa given on arrival.  Pt underwent revascularization 12/14.  Post intervention in IR, imaging shows small foci of infarction in the right hippocampus, R occipital lobe, L frontal and occipital lobes, bilateral cerebellum and the R thalamus.   Clinical Impression   Pt PTA: Pt living with family and independent. Pt reports that his spouse is getting dementia and he ends up caring for her. Pt limited by weakness on R side, but very minimal. Pt ambulating a household distance in hallway and then abruptly asking to sit down. Vitals were normal, but pt started to feel like his legs were going to give out. Pt assisted to chair and wheeled back into room to lay down. Pt 3+/5 MM grade in RUE compared to 4-/5 MM grade in LUE. Pt supervisionA level for ADL in sitting. Pt fatigues very easily. Pt requiring minA overall for ADL as pt takes increased time. Pt would greatly benefit from continued OT skilled services for ADL, mobility and safety. OT following acutely.    Follow Up Recommendations  Home health OT;Supervision/Assistance - 24 hour    Equipment Recommendations       Recommendations for Other Services       Precautions / Restrictions Precautions Precautions: Fall Restrictions Weight Bearing Restrictions: No      Mobility Bed Mobility Overal bed mobility: Needs Assistance Bed Mobility: Supine to Sit;Sit to Supine     Supine to sit: Supervision Sit  to supine: Supervision   General bed mobility comments: no need for the rail, but extra time needed to move around the bed.  Transfers Overall transfer level: Needs assistance   Transfers: Sit to/from Stand Sit to Stand: Min assist         General transfer comment: assist to bring pt forward more than boost.    Balance Overall balance assessment: Needs assistance Sitting-balance support: Single extremity supported;No upper extremity supported Sitting balance-Leahy Scale: Good     Standing balance support: During functional activity;Bilateral upper extremity supported;No upper extremity supported Standing balance-Leahy Scale: Fair Standing balance comment: able to balance at EOB without assist staticall, but needing AD for dynamics.                           ADL either performed or assessed with clinical judgement   ADL Overall ADL's : At baseline                                     Functional mobility during ADLs: Min guard;Cueing for safety General ADL Comments: Pt supervisionA level for ADL in sitting. Pt fatigures very easily.     Vision Baseline Vision/History: Wears glasses Wears Glasses: At all times Patient Visual Report: No change from baseline Vision Assessment?: No apparent visual deficits     Perception  Praxis      Pertinent Vitals/Pain Pain Assessment: Faces Faces Pain Scale: No hurt     Hand Dominance Right   Extremity/Trunk Assessment Upper Extremity Assessment Upper Extremity Assessment: Generalized weakness;RUE deficits/detail RUE Deficits / Details: 3+/5 MM grade in RUE compared to 4-/5 MM grade in LUE RUE Sensation: WNL RUE Coordination: WNL   Lower Extremity Assessment Lower Extremity Assessment: Defer to PT evaluation;Generalized weakness   Cervical / Trunk Assessment Cervical / Trunk Assessment: Kyphotic   Communication Communication Communication: HOH   Cognition Arousal/Alertness:  Awake/alert Behavior During Therapy: WFL for tasks assessed/performed Overall Cognitive Status: Impaired/Different from baseline(NT formally, but pt is distractable and mildly disoriented.)                     Current Attention Level: Sustained   Following Commands: Follows one step commands with increased time Safety/Judgement: Decreased awareness of safety         General Comments  EHR 110's, SpO2 100%, RR 30'40's rpm, bp 124/66    Exercises     Shoulder Instructions      Home Living Family/patient expects to be discharged to:: Private residence Living Arrangements: Spouse/significant other Available Help at Discharge: Family;Available 24 hours/day Type of Home: House Home Access: Stairs to enter [employer] of Steps: 2 steps from carport; 8 steps from back porch Entrance Stairs-Rails: None Home Layout: One level     Bathroom Shower/Tub: Teacher, early years/pre: Standard Bathroom Accessibility: Yes   Home Equipment: Environmental consultant - 2 wheels   Additional Comments: walker but does not use it, per pt report his wife has the beginning of dementia  Lives With: Spouse    Prior Functioning/Environment Level of Independence: Independent                 OT Problem List: Decreased activity tolerance;Decreased safety awareness;Pain      OT Treatment/Interventions: Self-care/ADL training;Therapeutic exercise;Energy conservation;Therapeutic activities;Patient/family education;Balance training    OT Goals(Current goals can be found in the care plan section) Acute Rehab OT Goals Patient Stated Goal: to go home OT Goal Formulation: With patient Time For Goal Achievement: 12/05/19 Potential to Achieve Goals: Good ADL Goals Pt Will Perform Grooming: with supervision;standing Pt Will Perform Upper Body Dressing: with set-up;sitting;standing Pt Will Perform Lower Body Dressing: with supervision;sitting/lateral leans;sit to/from stand Pt Will  Transfer to Toilet: with supervision;ambulating;bedside commode Pt Will Perform Toileting - Clothing Manipulation and hygiene: with supervision;sitting/lateral leans;sit to/from stand Pt/caregiver will Perform Home Exercise Program: Increased strength;Both right and left upper extremity;With Supervision  OT Frequency: Min 2X/week   Barriers to D/C:            Co-evaluation              AM-PAC OT "6 Clicks" Daily Activity     Outcome Measure Help from another person eating meals?: None Help from another person taking care of personal grooming?: A Little Help from another person toileting, which includes using toliet, bedpan, or urinal?: A Little Help from another person bathing (including washing, rinsing, drying)?: A Little Help from another person to put on and taking off regular upper body clothing?: None Help from another person to put on and taking off regular lower body clothing?: A Little 6 Click Score: 20   End of Session Equipment Utilized During Treatment: Gait belt Nurse Communication: Mobility status  Activity Tolerance: Patient limited by fatigue Patient left: in bed;with call bell/phone within reach;with bed alarm set  OT Visit Diagnosis:  Unsteadiness on feet (R26.81);Pain Pain - part of body: Leg                Time: [id number] OT Time Calculation (min): 26 min Charges:  OT General Charges $OT Visit: 1 Visit OT Evaluation $OT Eval Moderate Complexity: 1 Mod  Darryl Nestle) Marsa Aris OTR/L Acute Rehabilitation Services Pager: [phone] Office: [place] 11/21/2019, 5:13 PM

## 2019-11-22 LAB — GLUCOSE, CAPILLARY
Glucose-Capillary: 164 mg/dL — ABNORMAL HIGH (ref 70–99)
Glucose-Capillary: 136 mg/dL — ABNORMAL HIGH (ref 70–99)
Glucose-Capillary: 134 mg/dL — ABNORMAL HIGH (ref 70–99)
Glucose-Capillary: 93 mg/dL (ref 70–99)

## 2019-11-22 LAB — CBC
HCT: 26.5 % — ABNORMAL LOW (ref 39.0–52.0)
Hemoglobin: 8.3 g/dL — ABNORMAL LOW (ref 13.0–17.0)
MCH: 31.6 pg (ref 26.0–34.0)
MCHC: 31.3 g/dL (ref 30.0–36.0)
MCV: 100.8 fL — ABNORMAL HIGH (ref 80.0–100.0)
Platelets: 508 10*3/uL — ABNORMAL HIGH (ref 150–400)
RBC: 2.63 MIL/uL — ABNORMAL LOW (ref 4.22–5.81)
RDW: 14.1 % (ref 11.5–15.5)
WBC: 8.8 10*3/uL (ref 4.0–10.5)
nRBC: 0 % (ref 0.0–0.2)

## 2019-11-22 LAB — BASIC METABOLIC PANEL
Anion gap: 9 (ref 5–15)
BUN: 22 mg/dL (ref 8–23)
CO2: 23 mmol/L (ref 22–32)
Calcium: 8.4 mg/dL — ABNORMAL LOW (ref 8.9–10.3)
Chloride: 109 mmol/L (ref 98–111)
Creatinine, Ser: 0.89 mg/dL (ref 0.61–1.24)
GFR calc Af Amer: >60 mL/min (ref >60)
GFR calc non Af Amer: >60 mL/min (ref >60)
Glucose, Bld: 149 mg/dL — ABNORMAL HIGH (ref 70–99)
Potassium: 3.9 mmol/L (ref 3.5–5.1)
Sodium: 141 mmol/L (ref 135–145)

## 2019-11-22 NOTE — Progress Notes (Signed)
STROKE TEAM PROGRESS NOTE   INTERVAL HISTORY Pt lying in bed, RN at bedside. Pt complains of SOB, however, his O2 sat 99-100%. He was able to talk in sentences. More anxious than breathing difficulty. He has Xanax PRN. Put on 2L [place], pt felt much better. Pending SNF.     Vitals:   11/21/19 2300 11/21/19 2327 11/22/19 0405 11/22/19 0842  BP: 108/68 120/73 113/67 102/67  Pulse: 93 89 79 85  Resp: (!) 29 17 17 18   Temp:  (!) 97.4 F (36.3 C)  97.9 F (36.6 C)  TempSrc:  Oral  Oral  SpO2: 98% 98% 99% 98%  Weight:      Height:        CBC:  Recent Labs  Lab 11/19/19 0432 11/20/19 0500 11/21/19 0445 11/22/19 0243  WBC 8.2 8.3 11.4* 8.8  NEUTROABS 6.3 7.3  --   --   HGB 10.4* 7.7* 8.8* 8.3*  HCT 33.8* 24.9* 27.6* 26.5*  MCV 101.2* 100.0 97.5 100.8*  PLT 679* 518* 569* 508*    Basic Metabolic Panel:  Recent Labs  Lab 11/21/19 0445 11/22/19 0243  NA 144 141  K 4.2 3.9  CL 112* 109  CO2 19* 23  GLUCOSE 115* 149*  BUN 25* 22  CREATININE 0.81 0.89  CALCIUM 8.5* 8.4*   Lipid Panel:     Component Value Date/Time   CHOL 81 11/20/2019 0500   TRIG 67 11/20/2019 0500   HDL 32 (L) 11/20/2019 0500   CHOLHDL 2.5 11/20/2019 0500   VLDL 13 11/20/2019 0500   LDLCALC 36 11/20/2019 0500   HgbA1c:  Lab Results  Component Value Date   HGBA1C 6.9 (H) 11/20/2019   Urine Drug Screen:     Component Value Date/Time   LABOPIA NONE DETECTED 11/19/2019 0900   COCAINSCRNUR NONE DETECTED 11/19/2019 0900   LABBENZ NONE DETECTED 11/19/2019 0900   AMPHETMU NONE DETECTED 11/19/2019 0900   THCU NONE DETECTED 11/19/2019 0900   LABBARB NONE DETECTED 11/19/2019 0900    Alcohol Level     Component Value Date/Time   ETH <10 11/19/2019 0432    IMAGING  DG CHEST PORT 1 VIEW 11/20/2019 No acute disease. Cardiomegaly. Atherosclerosis. Hiatal hernia.    MR BRAIN WO CONTRAST MR MRA HEAD WO CONTRAST 11/19/2019 1419 Several small foci of acute infarction as described, likely embolic. No  evidence of hemorrhage. Compared to prior CTA, there is improved flow within the intracranial right vertebral artery, distal basilar artery, and right posterior cerebral artery. Chronic microvascular ischemic changes.   ECHOCARDIOGRAM COMPLETE 11/19/2019 1. Left ventricular ejection fraction, by visual estimation, is 20 to 25%. The left ventricle has normal function. There is no left ventricular hypertrophy.  2. Left ventricular diastolic parameters are indeterminate.  3. The left ventricle demonstrates global hypokinesis.  4. COmpared to previous echo on 11/12/19, LVEF is unchanged.  5. Global right ventricle mild to moderately reduced..The right ventricular size is normal. No increase in right ventricular wall thickness.  6. Left atrial size was moderately dilated.  7. Right atrial size was normal.  8. The mitral valve is grossly normal. Moderate mitral valve regurgitation.  9. The tricuspid valve is normal in structure. Tricuspid valve regurgitation is trivial. 10. The aortic valve is tricuspid. Aortic valve regurgitation is not visualized. Mild aortic valve stenosis. 11. The pulmonic valve was normal in structure. Pulmonic valve regurgitation is not visualized. 12. The aortic root was not well visualized. 13. The inferior vena cava is normal in size with  greater than 50% respiratory variability, suggesting right atrial pressure of 3 mmHg.     Cerebral Angio 11/19/2019 S/P Lt VA angiogram followed by complete revascularization of basilar artery with x 1 pass with 21mm x 40 mm solitaire X retriever device and penumbra aspiration achieving a TICI 3 revascularization. 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  CT C-SPINE NO CHARGE 11/19/2019 0654 No evidence of cervical spine injury.   CT Code Stroke CTA Head W/WO contrast CT Code Stroke CTA Neck W/WO contrast CT Code Stroke Cerebral Perfusion with contrast 11/19/2019 0524 1. Distal basilar occlusion with minimal thready flow in the right  PCA distribution where there is 18 cc of penumbra. 2. Severe right supraclinoid ICA stenosis with right MCA branch underfilling, chronic appearing 3. High-grade narrowing at the left vertebrobasilar junction. No meaningful contribution to the basilar by the non dominant right vertebral artery. 4. Heavily calcified carotid bifurcations, especially on the left where ICA stenosis measures 75%.   CT HEAD CODE STROKE WO CONTRAST 11/19/2019 0457 Aging brain without acute or reversible finding.    PHYSICAL EXAM   Temp:  [97.3 F (36.3 C)-98.3 F (36.8 C)] 97.9 F (36.6 C) (12/17 0842) Pulse Rate:  [42-105] 85 (12/17 0842) Resp:  [15-33] 18 (12/17 0842) BP: (95-129)/(54-85) 102/67 (12/17 0842) SpO2:  [94 %-100 %] 98 % (12/17 0842)  General - Well nourished, well developed, mild SOB.  Ophthalmologic - fundi not visualized due to noncooperation.  Cardiovascular - regular heart rate and rhythm with frequent PCAs and PVCs.  Mental Status -  Level of arousal and orientation to time, place, and person were intact. Language including expression, naming, repetition, comprehension was assessed and found intact.  Cranial Nerves II - XII - II - Visual field intact OU. III, IV, VI - Extraocular movements intact. V - Facial sensation intact bilaterally. VII - Facial movement intact bilaterally. VIII - Hearing & vestibular intact bilaterally. X - Palate elevates symmetrically. XI - Chin turning & shoulder shrug intact bilaterally. XII - Tongue protrusion intact.  Motor Strength - The patient's strength was symmetrical in all extremities and pronator drift was absent.  Bulk was normal and fasciculations were absent.   Motor Tone - Muscle tone was assessed at the neck and appendages and was normal.  Reflexes - The patient's reflexes were symmetrical in all extremities and he had no pathological reflexes.  Sensory - Light touch, temperature/pinprick were assessed and were symmetrical.     Coordination - The patient had normal movements in the hands with no ataxia or dysmetria.  Tremor was absent.  Gait and Station - deferred.   ASSESSMENT/PLAN Kenneth [name] is a 85 y.o. male with history of recent STEMI s/p stenting who was d/c home 12/11, developing L sided weakness. Found to have a BA occlusion and sent to IR.    Stroke: small scattered B cerebellar, right PCA, left MCA and MCA/PCA, right thalamus in setting of BA and right PCA occlusion s/p IR with TICI3 reperfusion and intra/extracranial stenosis, likely secondary to large vessel disease with hypotension and CHF, vs. cardioembolic source with low EF and possible occult afib  Code Stroke CT head No acute abnormality.  CTA head & neck distal BA occlusion w/ thready flow R PCA w/ 18cc penumbra. Severe R supraclinoid ICA stenosis w/ R MCA branch underfilling (chronic). High-grade narrowing L VBJ. Heavily calcified ICA bifurcations, L 75%.  CT perfusion R PCA w/ 18cc penumbra  Cerebral angio TICI 3 revascularization of  BA. 50-60% stenosis proximal BA.  Post IR CT NO ICH or mass effect.   MRI  Several small foci acute infarct as above. Small vessel disease.  MRA  Improved flow intracranial R VA, distal BA, R PCA  2D Echo  EF 20-25% with no SOE, unchanged since 11/12/19  LDL 36  HgbA1c 6.9  SCDs for VTE prophylaxis  Brilinta (ticagrelor) 90 mg bid prior to admission, now on plavix 75 following load. Off ASA due to hx of severe GIB. Pt not good anticoagulation at this time due to hx of severe GIB, anemia and need of plavix for cardiac stent. Off Brilinta d/t SOB following administration.  Therapy recommendations:  HH PT & OT ->SNF at request of daughter   Disposition:  pending (caregiver for wife who has dementia), covid repeat pending  Recent STEMI s/p stenting  11/12/19 admitted by cardiology  Cardiac cath - severe multivessle CAD  S/p stenting RCA  On plavix only due to hx of GIB (changed from  Brilinta d/t SOB, no ASA)  Hx of CABG 1993  EKG unchanged  Trop 903 - much improved  Cardiology consult appreciated  Severe intracranial atherosclerosis  CTA head and neck showed Severe R supraclinoid ICA stenosis w/ R MCA branch underfilling (chronic). Severe left ICA siphon high grade stenosis. High-grade narrowing L VBJ. Heavily calcified ICA bifurcations bilaterally L>R, with L 75%.  On DAPT  Avoid low BP  Long-term BP goal 120-150 given severe intracranial stenosis  Cardiomyopathy  Chronic Systolic HF  EF [id number]  Current TTE EF 20-25%  Cardiology consult appreciate  Brilinta changed to plavix as SOB following Brilinta administration  Hold lasix   Resume imdur, metoprolol, and low dose losartran  CXR no acute disease 11/20/19  BNP 2,174    Hx of GIB with anemia  Recent diverticulitis and large HH w/ hx colectomy 2-3 yrs ago, treated with cipro and flagyl. d/c 11/04/2019  Hx remote GIB on ASA  Hb 8.5 on 11/14/19  No ASA as per cardiology post stenting due to hx of GIB  Hb 11.2->7.7->8.9->8.8->8.3   Continue brilinta, d/c ASA  Guaiac stool negative   Transfuse if Hb < 7.0  Hx if hypertension Hypotension   Home meds:  imdur 60, losartan 12.5, metoprolol 25  Little on the low side - 100-120s  Resume home meds - imdur, losartan and metoprolol  Hold off lasix . Long-term BP goal 120-150 given severe intracranial stenosis  Hyperlipidemia  Home meds:  crestor 20, resumed in hospital  LDL 50, goal < 70  Continue statin at discharge  Diabetes type II Controlled  Home meds:  Metformin 500 acb  HgbA1c 6.8, goal < 7.0  CBGs  SSI  Glucose stable   Other Stroke Risk Factors  Advanced age  Former Cigarette smoker  ETOH use, alcohol level <10, advised to drink no more than 2 drink(s) a day  Other Active Problems  Hypothyroid on synthroid  C-collar cleared after CT C-spine and pain free neck movement   Leukocytosis  8.3->11.4 - continue monitoring   Thrombocytosis PLT 518->569->508  Anxiety, xanax prn  Hospital day # 3  Rosalin Hawking, MD PhD Stroke Neurology 11/22/2019 9:03 AM  To contact Stroke Continuity provider, please refer to [email or web address] After hours, contact General Neurology

## 2019-11-22 NOTE — Plan of Care (Signed)
  Problem: Education: Goal: Knowledge of disease or condition will improve Outcome: Progressing Goal: Knowledge of secondary prevention will improve Outcome: Progressing Goal: Knowledge of patient specific risk factors addressed and post discharge goals established will improve Outcome: Progressing Goal: Individualized Educational Video(s) Outcome: Progressing   Problem: Coping: Goal: Will verbalize positive feelings about self Outcome: Progressing Goal: Will identify appropriate support needs Outcome: Progressing   Problem: Health Behavior/Discharge Planning: Goal: Ability to manage health-related needs will improve Outcome: Progressing   Problem: Self-Care: Goal: Ability to participate in self-care as condition permits will improve Outcome: Progressing Goal: Verbalization of feelings and concerns over difficulty with self-care will improve Outcome: Progressing Goal: Ability to communicate needs accurately will improve Outcome: Progressing   Problem: Nutrition: Goal: Risk of aspiration will decrease Outcome: Progressing Goal: Dietary intake will improve Outcome: Progressing   Problem: Ischemic Stroke/TIA Tissue Perfusion: Goal: Complications of ischemic stroke/TIA will be minimized Outcome: Progressing   Ival Bible, BSN, RN

## 2019-11-22 NOTE — NC FL2 (Signed)
[place] LEVEL OF CARE SCREENING TOOL     IDENTIFICATION  Patient Name: Kenneth [name] Birthdate: 01/06/1928 Sex: male Admission Date (Current Location): 11/19/2019  [facility] and [place] Number:  Herbalist and Address:  [facility] [place], [place] [address], [place], [place] [address]      Provider Number: [id number]  Attending Physician Name and Address:  Rosalin Hawking, MD  Relative Name and Phone Number:       Current Level of Care: Hospital Recommended Level of Care: [place] Prior Approval Number:    Date Approved/Denied:   PASRR Number:  [id number]  Discharge Plan: SNF    Current Diagnoses: Patient Active Problem List   Diagnosis Date Noted  . Hx of CABG   . Pure hypercholesterolemia   . Acute embolic stroke ([place]) 0000000  . 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 Acute systolic heart failure ([place]) 11/16/2019  . Acute ST elevation myocardial infarction (STEMI) of posterior wall ([place]) 11/12/2019  . ST elevation myocardial infarction involving right coronary artery ([place])   . Diverticulitis 11/02/2019  . Colostomy status ([place]) 03/16/2016  . Elevated troponin 09/13/2015  . Hemoptysis   . Nausea and vomiting 09/09/2015  . Small bowel obstruction ([place]) 09/09/2015  . Hypertensive urgency 09/09/2015  . Diabetes mellitus ([place]) 09/09/2015  . Colonic mass   . Intestinal obstruction ([place])   . Preop cardiovascular exam 06/27/2014  . Left inguinal hernia 06/26/2014  . Dysphagia 09/28/2013  . Abnormal weight loss 09/28/2013  . HTN (hypertension)   . Coronary artery disease involving coronary bypass graft of native heart without angina pectoris   . Hyperlipidemia with target LDL less than 70   . GERD (gastroesophageal reflux disease)   . Nephrolithiasis   . Melanoma of skin, site unspecified   . Glaucoma     Orientation RESPIRATION BLADDER Height & Weight     Self, Time, Place  Normal Continent Weight: 68  kg Height:  5\' 10"  (177.8 cm)  BEHAVIORAL SYMPTOMS/MOOD NEUROLOGICAL BOWEL NUTRITION STATUS      Continent Diet(dysphagia 3 with thins)  AMBULATORY STATUS COMMUNICATION OF NEEDS Skin   Limited Assist Verbally Bruising(bruising to bilateral arms and lt groin with healing cath site)                       Personal Care Assistance Level of Assistance  Bathing, Feeding, Dressing Bathing Assistance: Limited assistance Feeding assistance: Independent Dressing Assistance: Limited assistance     Functional Limitations Info  Sight, Hearing, Speech Sight Info: Impaired Hearing Info: Impaired Speech Info: Adequate    SPECIAL CARE FACTORS FREQUENCY  PT (By licensed PT), OT (By licensed OT), Speech therapy     PT Frequency: 5x/wk OT Frequency: 5x/wk     Speech Therapy Frequency: 5x/wk      Contractures Contractures Info: Not present    Additional Factors Info  Code Status, Allergies, Insulin Sliding Scale Code Status Info: Full Allergies Info: lasix/ spironolactone/ aspirin/ lipitor/ lopressor/ penicillin   Insulin Sliding Scale Info: Novolog 0-15 units SQ three times aday       Current Medications (11/22/2019):  This is the current hospital active medication list Current Facility-Administered Medications  Medication Dose Route Frequency Provider Last Rate Last Admin  .  stroke: mapping our early stages of recovery book   Does not apply Once Greta Doom, MD      . acetaminophen (TYLENOL) tablet 650 mg  650 mg Oral Q4H  PRN Luanne Bras, MD   650 mg at 11/21/19 2356   Or  . acetaminophen (TYLENOL) 160 MG/5ML solution 650 mg  650 mg Per Tube Q4H PRN Deveshwar, Willaim Rayas, MD       Or  . acetaminophen (TYLENOL) suppository 650 mg  650 mg Rectal Q4H PRN Deveshwar, Willaim Rayas, MD      . ALPRAZolam Duanne Moron) tablet 0.5 mg  0.5 mg Oral TID PRN Rosalin Hawking, MD   0.5 mg at 11/21/19 2010  . Chlorhexidine Gluconate Cloth 2 % PADS 6 each  6 each Topical Q0600 Rosalin Hawking, MD    6 each at 11/21/19 1000  . clopidogrel (PLAVIX) tablet 75 mg  75 mg Oral Daily Reino Bellis B, NP   75 mg at 11/22/19 1016  . ferrous sulfate tablet 325 mg  325 mg Oral Q breakfast Rosalin Hawking, MD   325 mg at 11/22/19 0831  . insulin aspart (novoLOG) injection 0-15 Units  0-15 Units Subcutaneous TID WC & HS Rosalin Hawking, MD   3 Units at 11/22/19 [phone]  . isosorbide mononitrate (IMDUR) 24 hr tablet 60 mg  60 mg Oral QPM Greta Doom, MD   60 mg at 11/21/19 1700  . latanoprost (XALATAN) 0.005 % ophthalmic solution 1 drop  1 drop Both Eyes QHS Greta Doom, MD   1 drop at 11/21/19 2223  . levothyroxine (SYNTHROID) tablet 50 mcg  50 mcg Oral Q0600 Greta Doom, MD   50 mcg at 11/22/19 [id number]  . losartan (COZAAR) tablet 12.5 mg  12.5 mg Oral Daily Rosalin Hawking, MD   12.5 mg at 11/22/19 1016  . metoprolol succinate (TOPROL-XL) 24 hr tablet 25 mg  25 mg Oral Daily Rosalin Hawking, MD   25 mg at 11/22/19 1016  . pantoprazole (PROTONIX) EC tablet 40 mg  40 mg Oral BID Rosalin Hawking, MD   40 mg at 11/22/19 1016  . rosuvastatin (CRESTOR) tablet 20 mg  20 mg Oral q1800 Greta Doom, MD   20 mg at 11/21/19 1706  . senna-docusate (Senokot-S) tablet 1 tablet  1 tablet Oral Once Greta Doom, MD         Discharge Medications: Please see discharge summary for a list of discharge medications.  Relevant Imaging Results:  Relevant Lab Results:   Additional Information SS#: [id number]  Pollie Friar, RN

## 2019-11-22 NOTE — Care Management Important Message (Signed)
Important Message  Patient Details  Name: Kenneth [name] MRN: [id number] Date of Birth: [birth year]   Medicare Important Message Given:  Yes     [name] 11/22/2019, 12:54 PM

## 2019-11-22 NOTE — Progress Notes (Signed)
Physical Therapy Treatment Patient Details Name: IRIS MCQUILLAN MRN: [id number] DOB: 1928/02/16 Today's Date: 11/22/2019    History of Present Illness pt is an 85 y/o male with h/o HTN, gout, glaucoma, DM, and recently admitted for STEMIwith stenting of the venous graft supplying the PDA.  Prior to this admission, pt had trouble getting OOB and inability to move L side.  On arrival of pt's dtr to his home, he had fallen trying to get OOB with his wife's assist.  Due to dtr noting that pt could not move his left side, she calle EMS.  tPa given on arrival.  Pt underwent revascularization 12/14.  Post intervention in IR, imaging shows small foci of infarction in the right hippocampus, R occipital lobe, L frontal and occipital lobes, bilateral cerebellum and the R thalamus.    PT Comments    Pt performed gt training and functional mobility during session. He required min assistance to mobilize today.  He perseverated on having nausea and inability to breathe.  His SpO2 was 100% on RA.  Nurse entered to give nausea meds.  Based on his slow functional gains and his need for assistance at d/c he will require SNF placement before returning home.  Pt has had 3 falls in the last two months and his wife has dementia. Based on inadequate assistance at home will inform supervising PT of need for change in recommendations.  Plan next session to continue progression of functional mobility to tolerance.     Follow Up Recommendations  SNF;Supervision/Assistance - 24 hour     Equipment Recommendations  None recommended by PT    Recommendations for Other Services       Precautions / Restrictions Precautions Precautions: Fall Restrictions Weight Bearing Restrictions: No    Mobility  Bed Mobility Overal bed mobility: Needs Assistance Bed Mobility: Supine to Sit     Supine to sit: Min assist     General bed mobility comments: Pt required assistance to move LEs to edge of bed.  Pt also required  assistance to elevate trunk into sitting.  Once in sitting he was able to move to edge of bed via scooting.  Transfers Overall transfer level: Needs assistance Equipment used: Rolling walker (2 wheeled) Transfers: Sit to/from Stand Sit to Stand: Min assist         General transfer comment: Cues for hand placement to and from seated surface.  Min assistance to lower to seated surface.  Ambulation/Gait Ambulation/Gait assistance: Min assist Gait Distance (Feet): 20 Feet Assistive device: Rolling walker (2 wheeled) Gait Pattern/deviations: Step-through pattern;Shuffle;Trunk flexed Gait velocity: decreased   General Gait Details: Cues for upper trunk control and RW safety.  He pushed device too far forward and required cues to step closer.  Pt required assistance to achieveupper trunk control and for obstacle negotiation.   Stairs             Wheelchair Mobility    Modified Rankin (Stroke Patients Only) Modified Rankin (Stroke Patients Only) Pre-Morbid Rankin Score: No significant disability Modified Rankin: Moderately severe disability     Balance Overall balance assessment: Needs assistance Sitting-balance support: Single extremity supported;No upper extremity supported Sitting balance-Leahy Scale: Good       Standing balance-Leahy Scale: Fair                              Cognition Arousal/Alertness: Awake/alert Behavior During Therapy: WFL for tasks assessed/performed Overall Cognitive Status: Impaired/Different from  baseline                                 General Comments: Some difficulty remains with following commands,  he is alert and oriented. Mostly perseverating on feeling nauseous.      Exercises      General Comments        Pertinent Vitals/Pain Pain Assessment: Faces Faces Pain Scale: Hurts little more Pain Location: stomach Pain Intervention(s): Monitored during session;Repositioned    Home Living                       Prior Function            PT Goals (current goals can now be found in the care plan section) Acute Rehab PT Goals Patient Stated Goal: to go home Potential to Achieve Goals: Good Progress towards PT goals: Progressing toward goals    Frequency    Min 3X/week      PT Plan Discharge plan needs to be updated    Co-evaluation              AM-PAC PT "6 Clicks" Mobility   Outcome Measure  Help needed turning from your back to your side while in a flat bed without using bedrails?: None Help needed moving from lying on your back to sitting on the side of a flat bed without using bedrails?: None Help needed moving to and from a bed to a chair (including a wheelchair)?: None Help needed standing up from a chair using your arms (e.g., wheelchair or bedside chair)?: A Little Help needed to walk in hospital room?: A Little Help needed climbing 3-5 steps with a railing? : A Little 6 Click Score: 21    End of Session Equipment Utilized During Treatment: Gait belt Activity Tolerance: Patient limited by fatigue;Patient tolerated treatment well Patient left: in bed;with call bell/phone within reach Nurse Communication: Mobility status PT Visit Diagnosis: Unsteadiness on feet (R26.81);History of falling (Z91.81);Other symptoms and signs involving the nervous system (R29.898)     Time: [id number] PT Time Calculation (min) (ACUTE ONLY): 21 min  Charges:  $Gait Training: 8-22 mins                     Erasmo Leventhal , PTA Acute Rehabilitation Services Pager [phone] Office [phone]     Rhianon Zabawa Eli Hose 11/22/2019, 1:24 PM

## 2019-11-22 NOTE — Plan of Care (Signed)
Informed by RN patient has had 3 loose stools with extreme foul smell in the past 24 hours. C. difficile testing ordered Enteric precautions ordered Day team to continue to follow

## 2019-11-22 NOTE — TOC Initial Note (Signed)
Transition of Care [facility]) - Initial/Assessment Note    Patient Details  Name: Kenneth [name] MRN: [id number] Date of Birth: 1928-10-13  [facility] CM/SW Contact:    Pollie Friar, RN Phone Number: 11/22/2019, 11:38 AM  Clinical Narrative:    CM met with the patient to discuss plans at d/c and pt asked that CM call his daughter.      Per daughter: Daleen Snook, pt only has his wife at home to provide supervision and she has dementia. Pt has been admitted x 3 since Thanksgiving. CM spoke to [place] and she asked that patient have some rehab prior to d/c home. FL2 done and patient faxed out in the [facility] area with [employer] permission.  TOC following.  Expected Discharge Plan: Skilled Nursing Facility Barriers to Discharge: Continued Medical Work up   Patient Goals and CMS Choice   CMS Medicare.gov Compare Post Acute Care list provided to:: Patient Represenative (must comment) Choice offered to / list presented to : Patient, Adult Children([place]a)  Expected Discharge Plan and Services Expected Discharge Plan: [place] In-house Referral: Clinical Social Work Discharge Planning Services: CM Consult Post Acute Care Choice: [place] Living arrangements for the past 2 months: [place]                                      Prior Living Arrangements/Services Living arrangements for the past 2 months: Single Family Home Lives with:: Spouse Patient language and need for interpreter reviewed:: Yes Do you feel safe going back to the place where you live?: Yes      Need for Family Participation in Patient Care: Yes (Comment) Care giver support system in place?: No (comment)(wife with dementia)   Criminal Activity/Legal Involvement Pertinent to Current Situation/Hospitalization: No - Comment as needed  Activities of Daily Living Home Assistive Devices/Equipment: None ADL Screening (condition at time of  admission) Patient's cognitive ability adequate to safely complete daily activities?: Yes Is the patient deaf or have difficulty hearing?: Yes Does the patient have difficulty seeing, even when wearing glasses/contacts?: No Does the patient have difficulty concentrating, remembering, or making decisions?: No Patient able to express need for assistance with ADLs?: Yes Does the patient have difficulty dressing or bathing?: No Independently performs ADLs?: Yes (appropriate for developmental age) Does the patient have difficulty walking or climbing stairs?: No Weakness of Legs: None Weakness of Arms/Hands: None  Permission Sought/Granted                  Emotional Assessment Appearance:: Appears stated age Attitude/Demeanor/Rapport: Engaged Affect (typically observed): Accepting Orientation: : Oriented to Self, Oriented to Place, Oriented to  Time Alcohol / Substance Use: Not Applicable Psych Involvement: No (comment)  Admission diagnosis:  Stroke [facility]) [I63.9] Stroke (cerebrum) (HCC) [Y07.3] Acute embolic stroke ([place]) [X10.6] Basilar artery occlusion [I65.1] Patient Active Problem List   Diagnosis Date Noted  . Hx of CABG   . Pure hypercholesterolemia   . Acute embolic stroke ([place]) 26/94/8546  . 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 Acute systolic heart failure ([place]) 11/16/2019  . Acute ST elevation myocardial infarction (STEMI) of posterior wall ([place]) 11/12/2019  . ST elevation myocardial infarction involving right coronary artery ([place])   . Diverticulitis 11/02/2019  . Colostomy status ([place]) 03/16/2016  . Elevated troponin 09/13/2015  . Hemoptysis   . Nausea and vomiting 09/09/2015  . Small bowel obstruction (  [place]) 09/09/2015  . Hypertensive urgency 09/09/2015  . Diabetes mellitus ([place]) 09/09/2015  . Colonic mass   . Intestinal obstruction ([place])   . Preop cardiovascular exam 06/27/2014  . Left inguinal hernia 06/26/2014  . Dysphagia 09/28/2013    Class: Chronic  .  Abnormal weight loss 09/28/2013    Class: Chronic  . HTN (hypertension)   . Coronary artery disease involving coronary bypass graft of native heart without angina pectoris   . Hyperlipidemia with target LDL less than 70   . GERD (gastroesophageal reflux disease)   . Nephrolithiasis   . Melanoma of skin, site unspecified   . Glaucoma    PCP:  Lavone Orn, MD Pharmacy:   [facility], [place] [address] [address] Phone: [phone] Fax: [phone]  [facility] [place], [place] [address] [place] [address] Phone: [phone] Fax: [phone]  [facility] [place], [place] - [address] [place] [address] Phone: [phone] Fax: [phone]     Social Determinants of Health (SDOH) Interventions    Readmission Risk Interventions No flowsheet data found.

## 2019-11-22 NOTE — Progress Notes (Signed)
3 watery stools within 24hr period. Dr. Rory Percy informed.

## 2019-11-22 NOTE — Telephone Encounter (Signed)
Pt remains in the hospital at this time.

## 2019-11-23 LAB — C DIFFICILE QUICK SCREEN W PCR REFLEX
C Diff antigen: NEGATIVE
C Diff interpretation: NOT DETECTED
C Diff toxin: NEGATIVE

## 2019-11-23 LAB — GLUCOSE, CAPILLARY
Glucose-Capillary: 125 mg/dL — ABNORMAL HIGH (ref 70–99)
Glucose-Capillary: 152 mg/dL — ABNORMAL HIGH (ref 70–99)
Glucose-Capillary: 95 mg/dL (ref 70–99)
Glucose-Capillary: 120 mg/dL — ABNORMAL HIGH (ref 70–99)

## 2019-11-23 LAB — BASIC METABOLIC PANEL
Anion gap: 11 (ref 5–15)
BUN: 20 mg/dL (ref 8–23)
CO2: 21 mmol/L — ABNORMAL LOW (ref 22–32)
Calcium: 8.4 mg/dL — ABNORMAL LOW (ref 8.9–10.3)
Chloride: 109 mmol/L (ref 98–111)
Creatinine, Ser: 0.79 mg/dL (ref 0.61–1.24)
GFR calc Af Amer: >60 mL/min (ref >60)
GFR calc non Af Amer: >60 mL/min (ref >60)
Glucose, Bld: 153 mg/dL — ABNORMAL HIGH (ref 70–99)
Potassium: 3.4 mmol/L — ABNORMAL LOW (ref 3.5–5.1)
Sodium: 141 mmol/L (ref 135–145)

## 2019-11-23 LAB — CBC
HCT: 30 % — ABNORMAL LOW (ref 39.0–52.0)
Hemoglobin: 9.2 g/dL — ABNORMAL LOW (ref 13.0–17.0)
MCH: 30.7 pg (ref 26.0–34.0)
MCHC: 30.7 g/dL (ref 30.0–36.0)
MCV: 100 fL (ref 80.0–100.0)
Platelets: 468 10*3/uL — ABNORMAL HIGH (ref 150–400)
RBC: 3 MIL/uL — ABNORMAL LOW (ref 4.22–5.81)
RDW: 13.9 % (ref 11.5–15.5)
WBC: 7.5 10*3/uL (ref 4.0–10.5)
nRBC: 0 % (ref 0.0–0.2)

## 2019-11-23 LAB — RESPIRATORY PANEL BY RT PCR (FLU A&B, COVID)
Influenza A by PCR: NEGATIVE
Influenza B by PCR: NEGATIVE
SARS Coronavirus 2 by RT PCR: NEGATIVE

## 2019-11-23 MED ORDER — METOPROLOL SUCCINATE ER 25 MG PO TB24
12.5000 mg | ORAL_TABLET | Freq: Every day | ORAL | Status: DC
  Filled 2019-11-23: qty 1

## 2019-11-23 MED ORDER — POTASSIUM CHLORIDE 10 MEQ/100ML IV SOLN
10.0000 meq | INTRAVENOUS | Status: AC
  Administered 2019-11-23 (×4): 10 meq via INTRAVENOUS
  Filled 2019-11-23 (×4): qty 100

## 2019-11-23 MED ORDER — SODIUM CHLORIDE 0.9 % IV SOLN
INTRAVENOUS | Status: DC | PRN
  Administered 2019-11-23: 250 mL via INTRAVENOUS

## 2019-11-23 MED ORDER — ONDANSETRON HCL 4 MG/2ML IJ SOLN
4.0000 mg | Freq: Four times a day (QID) | INTRAMUSCULAR | Status: DC | PRN
  Administered 2019-11-23: 4 mg via INTRAVENOUS
  Filled 2019-11-23: qty 2

## 2019-11-23 NOTE — Progress Notes (Signed)
Paged MD regarding low BP. MD aware and wants to keep MAP 65 or greater.

## 2019-11-23 NOTE — Progress Notes (Signed)
Physical Therapy Treatment Patient Details Name: Kenneth [name] MRN: [id number] DOB: [birth year] Today's Date: 11/23/2019    History of Present Illness pt is an 85 y/o male with h/o HTN, gout, glaucoma, DM, and recently admitted for STEMIwith stenting of the venous graft supplying the PDA.  Prior to this admission, pt had trouble getting OOB and inability to move L side.  On arrival of pt's dtr to his home, he had fallen trying to get OOB with his wife's assist.  Due to dtr noting that pt could not move his left side, she calle EMS.  tPa given on arrival.  Pt underwent revascularization 12/14.  Post intervention in IR, imaging shows small foci of infarction in the right hippocampus, R occipital lobe, L frontal and occipital lobes, bilateral cerebellum and the R thalamus.    PT Comments    Pt more lethargic this afternoon, with decreased activity tolerance and requiring increased level of assist. Standing at edge of bed to walker with moderate assist and able to march in place and side step but unable to progress ambulation forwards. Rest of session focused on seated exercises for strengthening and functional reaching to promote dynamic seated balance and cervical/truncal extension. D/c plan remains appropriate.     Follow Up Recommendations  SNF;Supervision/Assistance - 24 hour     Equipment Recommendations  None recommended by PT    Recommendations for Other Services       Precautions / Restrictions Precautions Precautions: Fall Restrictions Weight Bearing Restrictions: No    Mobility  Bed Mobility Overal bed mobility: Needs Assistance Bed Mobility: Supine to Sit;Sit to Supine     Supine to sit: Mod assist Sit to supine: Mod assist   General bed mobility comments: ModA for BLE initiation off edge of bed and pulling up to sit  Transfers Overall transfer level: Needs assistance Equipment used: Rolling walker (2 wheeled) Transfers: Sit to/from Stand Sit to Stand: Mod  assist         General transfer comment: ModA to boost up to stand, cues for hand placement, looking up. Pt able to side step with increased time  Ambulation/Gait             General Gait Details: unable today   Stairs             Wheelchair Mobility    Modified Rankin (Stroke Patients Only) Modified Rankin (Stroke Patients Only) Pre-Morbid Rankin Score: No significant disability Modified Rankin: Moderately severe disability     Balance Overall balance assessment: Needs assistance Sitting-balance support: Single extremity supported Sitting balance-Leahy Scale: Poor Sitting balance - Comments: Pt with posterior lean with balance challenge i.e. with single LE unsupported   Standing balance support: During functional activity;Bilateral upper extremity supported;No upper extremity supported Standing balance-Leahy Scale: Poor Standing balance comment: reliant on external support                            Cognition Arousal/Alertness: Awake/alert Behavior During Therapy: Flat affect Overall Cognitive Status: Impaired/Different from baseline Area of Impairment: Following commands                   Current Attention Level: Sustained   Following Commands: Follows one step commands inconsistently       General Comments: Pt drowsy today, decreased interactiveness with therapy, following simple commands inconsistently      Exercises General Exercises - Lower Extremity Long Arc Quad: Both;10 reps;Seated Hip Flexion/Marching: Both;10 reps;Seated  Heel Raises: Both;10 reps;Seated Other Exercises Other Exercises: Seated functional reaching with BUE's    General Comments        Pertinent Vitals/Pain Pain Assessment: Faces Faces Pain Scale: No hurt    Home Living                      Prior Function            PT Goals (current goals can now be found in the care plan section) Acute Rehab PT Goals Patient Stated Goal: to go  home Potential to Achieve Goals: Good Progress towards PT goals: Progressing toward goals    Frequency    Min 3X/week      PT Plan Current plan remains appropriate    Co-evaluation              AM-PAC PT "6 Clicks" Mobility   Outcome Measure  Help needed turning from your back to your side while in a flat bed without using bedrails?: A Little Help needed moving from lying on your back to sitting on the side of a flat bed without using bedrails?: A Lot Help needed moving to and from a bed to a chair (including a wheelchair)?: A Lot Help needed standing up from a chair using your arms (e.g., wheelchair or bedside chair)?: A Lot Help needed to walk in hospital room?: A Lot Help needed climbing 3-5 steps with a railing? : A Lot 6 Click Score: 13    End of Session Equipment Utilized During Treatment: Gait belt Activity Tolerance: Patient limited by fatigue Patient left: in bed;with call bell/phone within reach Nurse Communication: Mobility status PT Visit Diagnosis: Unsteadiness on feet (R26.81);History of falling (Z91.81);Other symptoms and signs involving the nervous system (R29.898)     Time: [id number] PT Time Calculation (min) (ACUTE ONLY): 21 min  Charges:  $Therapeutic Exercise: 8-22 mins                     Ellamae Sia, PT, DPT Acute Rehabilitation Services Pager [phone] Office [phone]    Willy Eddy 11/23/2019, 5:23 PM

## 2019-11-23 NOTE — Progress Notes (Addendum)
STROKE TEAM PROGRESS NOTE   INTERVAL HISTORY Pt lying in bed, sleeping, easily arousable, comfortable, no SOB. Has 3 loose stools overnight, now resolved. C diff neg. Also has some nausea today, on zofran PRN. Pending SNF tomorrow as COVID test pending   Vitals:   11/23/19 0724 11/23/19 0847 11/23/19 1000 11/23/19 1148  BP: (!) 115/56   106/64  Pulse: (!) 50   61  Resp: 15   14  Temp: (!) 97.1 F (36.2 C)   97.9 F (36.6 C)  TempSrc: Axillary   Oral  SpO2: 100% 99% 98% 100%  Weight:      Height:        CBC:  Recent Labs  Lab 11/19/19 0432 11/20/19 0500 11/20/19 1411 11/22/19 0243 11/23/19 0224  WBC 8.2 8.3   < > 8.8 7.5  NEUTROABS 6.3 7.3  --   --   --   HGB 10.4* 7.7*  --  8.3* 9.2*  HCT 33.8* 24.9*  --  26.5* 30.0*  MCV 101.2* 100.0   < > 100.8* 100.0  PLT 679* 518*   < > 508* 468*   < > = values in this interval not displayed.    Basic Metabolic Panel:  Recent Labs  Lab 11/22/19 0243 11/23/19 0224  NA 141 141  K 3.9 3.4*  CL 109 109  CO2 23 21*  GLUCOSE 149* 153*  BUN 22 20  CREATININE 0.89 0.79  CALCIUM 8.4* 8.4*   Lipid Panel:     Component Value Date/Time   CHOL 81 11/20/2019 0500   TRIG 67 11/20/2019 0500   HDL 32 (L) 11/20/2019 0500   CHOLHDL 2.5 11/20/2019 0500   VLDL 13 11/20/2019 0500   LDLCALC 36 11/20/2019 0500   HgbA1c:  Lab Results  Component Value Date   HGBA1C 6.9 (H) 11/20/2019   Urine Drug Screen:     Component Value Date/Time   LABOPIA NONE DETECTED 11/19/2019 0900   COCAINSCRNUR NONE DETECTED 11/19/2019 0900   LABBENZ NONE DETECTED 11/19/2019 0900   AMPHETMU NONE DETECTED 11/19/2019 0900   THCU NONE DETECTED 11/19/2019 0900   LABBARB NONE DETECTED 11/19/2019 0900    Alcohol Level     Component Value Date/Time   ETH <10 11/19/2019 0432    IMAGING  DG CHEST PORT 1 VIEW 11/20/2019 No acute disease. Cardiomegaly. Atherosclerosis. Hiatal hernia.    MR BRAIN WO CONTRAST MR MRA HEAD WO CONTRAST 11/19/2019  1419 Several small foci of acute infarction as described, likely embolic. No evidence of hemorrhage. Compared to prior CTA, there is improved flow within the intracranial right vertebral artery, distal basilar artery, and right posterior cerebral artery. Chronic microvascular ischemic changes.   ECHOCARDIOGRAM COMPLETE 11/19/2019 1. Left ventricular ejection fraction, by visual estimation, is 20 to 25%. The left ventricle has normal function. There is no left ventricular hypertrophy.  2. Left ventricular diastolic parameters are indeterminate.  3. The left ventricle demonstrates global hypokinesis.  4. COmpared to previous echo on 11/12/19, LVEF is unchanged.  5. Global right ventricle mild to moderately reduced..The right ventricular size is normal. No increase in right ventricular wall thickness.  6. Left atrial size was moderately dilated.  7. Right atrial size was normal.  8. The mitral valve is grossly normal. Moderate mitral valve regurgitation.  9. The tricuspid valve is normal in structure. Tricuspid valve regurgitation is trivial. 10. The aortic valve is tricuspid. Aortic valve regurgitation is not visualized. Mild aortic valve stenosis. 11. The pulmonic valve was normal  in structure. Pulmonic valve regurgitation is not visualized. 12. The aortic root was not well visualized. 13. The inferior vena cava is normal in size with greater than 50% respiratory variability, suggesting right atrial pressure of 3 mmHg.     Cerebral Angio 11/19/2019 S/P Lt VA angiogram followed by complete revascularization of basilar artery with x 1 pass with 13mm x 40 mm solitaire X retriever device and penumbra aspiration achieving a TICI 3 revascularization. 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  CT C-SPINE NO CHARGE 11/19/2019 0654 No evidence of cervical spine injury.   CT Code Stroke CTA Head W/WO contrast CT Code Stroke CTA Neck W/WO contrast CT Code Stroke Cerebral Perfusion with  contrast 11/19/2019 0524 1. 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 2. Severe right supraclinoid ICA stenosis with right MCA branch underfilling, chronic appearing 3. High-grade narrowing at the left vertebrobasilar junction. No meaningful contribution to the basilar by the non dominant right vertebral artery. 4. Heavily calcified carotid bifurcations, especially on the left where ICA stenosis measures 75%.   CT HEAD CODE STROKE WO CONTRAST 11/19/2019 0457 Aging brain without acute or reversible finding.    PHYSICAL EXAM  Temp:  [97.1 F (36.2 C)-98.6 F (37 C)] 97.9 F (36.6 C) (12/18 1148) Pulse Rate:  [50-101] 61 (12/18 1148) Resp:  [14-20] 14 (12/18 1148) BP: (106-124)/(56-79) 106/64 (12/18 1148) SpO2:  [98 %-100 %] 100 % (12/18 1148)  General - Well nourished, well developed, not in acute distress.  Ophthalmologic - fundi not visualized due to noncooperation.  Cardiovascular - irregular heart rate and rhythm with frequent PCAs and PVCs.  Mental Status -  Level of arousal and orientation to time, place, and person were intact. Language including expression, naming, repetition, comprehension was assessed and found intact.  Cranial Nerves II - XII - II - Visual field intact OU. III, IV, VI - Extraocular movements intact. V - Facial sensation intact bilaterally. VII - Facial movement intact bilaterally. VIII - Hearing & vestibular intact bilaterally. X - Palate elevates symmetrically. XI - Chin turning & shoulder shrug intact bilaterally. XII - Tongue protrusion intact.  Motor Strength - The patient's strength was symmetrical in all extremities and pronator drift was absent.  Bulk was normal and fasciculations were absent.   Motor Tone - Muscle tone was assessed at the neck and appendages and was normal.  Reflexes - The patient's reflexes were symmetrical in all extremities and he had no  pathological reflexes.  Sensory - Light touch, temperature/pinprick were assessed and were symmetrical.    Coordination - The patient had normal movements in the hands with no ataxia or dysmetria.  Tremor was absent.  Gait and Station - deferred.   ASSESSMENT/PLAN Kenneth [name] is a 85 y.o. male with history of recent STEMI s/p stenting who was d/c home 12/11, developing L sided weakness. Found to have a BA occlusion and sent to IR.    Stroke: small scattered B cerebellar, right PCA, left MCA and MCA/PCA, right thalamus in setting of BA and right PCA occlusion s/p IR with TICI3 reperfusion and intra/extracranial stenosis, likely secondary to large vessel disease with hypotension and CHF, vs. cardioembolic source with low EF and possible occult afib  Code Stroke CT head No acute abnormality.  CTA head & neck distal BA occlusion w/ thready flow R PCA w/ 18cc penumbra. Severe R supraclinoid ICA stenosis w/ R MCA branch  underfilling (chronic). High-grade narrowing L VBJ. Heavily calcified ICA bifurcations, L 75%.  CT perfusion R PCA w/ 18cc penumbra  Cerebral angio TICI 3 revascularization of BA. 50-60% stenosis proximal BA.  Post IR CT NO ICH or mass effect.   MRI  Several small foci acute infarct as above. Small vessel disease.  MRA  Improved flow intracranial R VA, distal BA, R PCA  2D Echo  EF 20-25% with no SOE, unchanged since 11/12/19  LDL 36  HgbA1c 6.9  SCDs for VTE prophylaxis  Brilinta (ticagrelor) 90 mg bid prior to admission, now on plavix 75 following load. Off ASA due to hx of severe GIB. Pt not good anticoagulation at this time due to hx of severe GIB, anemia and need of plavix for cardiac stent. Off Brilinta d/t SOB following administration.  Therapy recommendations:  HH PT & OT ->SNF at request of daughter   Disposition:  pending (caregiver for wife who has dementia)  Plan for DC in am - COVID came back late but negative.  Recent STEMI s/p  stenting  11/12/19 admitted by cardiology  Cardiac cath - severe multivessle CAD  S/p stenting RCA  On plavix only due to hx of GIB (changed from Brilinta d/t SOB, no ASA)  Hx of CABG 1993  EKG unchanged  Trop 903 - much improved  Cardiology consult appreciated  Severe intracranial atherosclerosis  CTA head and neck showed Severe R supraclinoid ICA stenosis w/ R MCA branch underfilling (chronic). Severe left ICA siphon high grade stenosis. High-grade narrowing L VBJ. Heavily calcified ICA bifurcations bilaterally L>R, with L 75%.  On DAPT  Avoid low BP  Long-term BP goal 120-150 given severe intracranial stenosis  Cardiomyopathy  Chronic Systolic HF  EF [id number]  Current TTE EF 20-25%  Cardiology consult appreciate  Brilinta changed to plavix as SOB following Brilinta administration  Hold lasix   Resume imdur, metoprolol, and low dose losartran  CXR no acute disease 11/20/19  BNP 2,174    Hx of GIB with anemia  Recent diverticulitis and large HH w/ hx colectomy 2-3 yrs ago, treated with cipro and flagyl. d/c 11/04/2019  Hx remote GIB on ASA  Hb 8.5 on 11/14/19  No ASA as per cardiology post stenting due to hx of GIB  Hb 11.2->7.7->8.9->8.8->8.3->9.2   Guaiac stool negative   Transfuse if Hb < 7.0  Hx if hypertension Hypotension   Home meds:  imdur 60, losartan 12.5, metoprolol 25  Little on the low side - 100-120s  Resume home meds - imdur, losartan and metoprolol  Decrease metoprolol to 12.5 due to BP at lower end  Hold off lasix . Long-term BP goal 120-150 given severe intracranial stenosis  Hyperlipidemia  Home meds:  crestor 20, resumed in hospital  LDL 50, goal < 70  Continue statin at discharge  Diabetes type II Controlled  Home meds:  Metformin 500 acb  HgbA1c 6.8, goal < 7.0  CBGs  SSI  Glucose stable without metformin  Other Stroke Risk Factors  Advanced age  Former Cigarette smoker  ETOH use, alcohol  level <10, advised to drink no more than 2 drink(s) a day  Other Active Problems  Hypothyroid on synthroid  C-collar cleared after CT C-spine and pain free neck movement   Leukocytosis 8.3->11.4->7.5 - continue monitoring   Thrombocytosis PLT 518->569->508->468  Anxiety, xanax prn  Foul smelling stool x 4 yesterday, cdiff negative, isolation d/c'd  Hospital day # 4  I had long discussion with daughter over  the phone, updated pt current condition, treatment plan and potential prognosis, and answered all the questions. I let her know that our plan is to dc him to SNF tomorrow. She expressed understanding and appreciation.    Rosalin Hawking, MD PhD Stroke Neurology 11/23/2019 1:49 PM  To contact Stroke Continuity provider, please refer to [email or web address] After hours, contact General Neurology

## 2019-11-23 NOTE — Discharge Summary (Addendum)
Stroke Discharge Summary  Patient ID: dairon sposato   MRN: [id number]      DOB: November 09, 1928  Date of Admission: 11/19/2019 Date of Discharge: 11/24/2019  Attending Physician:  Rosalin Hawking, MD, Stroke MD Consultant(s):   [facility], Michae Kava, MD Tamala Julian)  Patient's PCP:  Lavone Orn, MD  DISCHARGE DIAGNOSIS:  Stroke: small scattered bilateral cerebellar, right PCA, left MCA and MCA/PCA, right thalamus in setting of BA and right PCA occlusions. s/p Interventional Radiology thrombectomy with TICI3 reperfusion and intra/extracranial stenosis. Acute embolic stroke (HCC) - likely secondary to large vessel disease with hypotension and CHF, vs. cardioembolic source with low EF and possible occult afib Active Problems:    Hyperlipidemia with target LDL less than 70    Diabetes mellitus (HCC)    Elevated troponin    ST elevation myocardial infarction involving right coronary artery ([place]) [id number]    Acute systolic heart failure (HCC)    Basilar artery occlusion - intracranial atherosclerosis    Hx of CABG    Pure hypercholesterolemia  Hypothyroid on synthroid  C-collar cleared after CT C-spine and pain free neck movement   Leukocytosis 8.3->11.4->7.5    Thrombocytosis PLT 518->569->508->468  Anxiety, xanax prn  Multiple foul smelling stools , cdiff negative, isolation d/c'd  Hypokalemia - treated and resolved  Hypertension / Hypotension  GI bleed with anemia - transfused - aspirin discontinued - Brilinta changed to Plavix  Cardiomyopathy EF 20 - 25%  SARS Coronavirus 2 by RT PCR 11/23/19 - negative  Full Code     Allergies as of 11/24/2019       Reactions   Lasix [furosemide] Other (See Comments)   aches   Spironolactone Other (See Comments)   Red, puffy, eyes, almost hospitalized   Aspirin Other (See Comments)   Bleeding.     Lipitor [atorvastatin] Other (See Comments)   tired   Lopressor [metoprolol Tartrate] Other (See Comments)   Sleepy all the time 11.27.2020  Patient is currently taking Lopressor.   Penicillins Hives, Rash   Has patient had a PCN reaction causing immediate rash, facial/tongue/throat swelling, SOB or lightheadedness with hypotension: No Has patient had a PCN reaction causing severe rash involving mucus membranes or skin necrosis: No Has patient had a PCN reaction that required hospitalization No Has patient had a PCN reaction occurring within the last 10 years: Yes If all of the above answers are "NO", then may proceed with Cephalosporin use.        Medication List     STOP taking these medications    metFORMIN 1000 MG tablet Commonly known as: GLUCOPHAGE   ticagrelor 90 MG Tabs tablet Commonly known as: BRILINTA       TAKE these medications    acetaminophen 325 MG tablet Commonly known as: TYLENOL Take 325 mg by mouth every 6 (six) hours as needed for mild pain.   ALPRAZolam 0.5 MG tablet Commonly known as: XANAX Take 1 tablet (0.5 mg total) by mouth 3 (three) times daily as needed for anxiety.   clopidogrel 75 MG tablet Commonly known as: PLAVIX Take 1 tablet (75 mg total) by mouth daily.   ferrous sulfate 325 (65 FE) MG tablet Take 325 mg by mouth daily with breakfast.   isosorbide mononitrate 60 MG 24 hr tablet Commonly known as: IMDUR Take 60 mg by mouth every evening.   latanoprost 0.005 % ophthalmic solution Commonly known as: XALATAN Place 1 drop into both eyes at bedtime.   levothyroxine 50 MCG tablet  Commonly known as: SYNTHROID Take 1 tablet (50 mcg total) by mouth daily at 6 (six) AM. Start taking on: November 25, 2019 What changed:  medication strength when to take this   losartan 25 MG tablet Commonly known as: COZAAR Take 0.5 tablets (12.5 mg total) by mouth daily.   metoprolol succinate 25 MG 24 hr tablet Commonly known as: TOPROL-XL Take 0.5 tablets (12.5 mg total) by mouth daily. What changed:  medication strength how much to take how to take this when to take  this additional instructions   multivitamin with minerals Tabs tablet Take 1 tablet by mouth daily.   nitroGLYCERIN 0.4 MG SL tablet Commonly known as: Nitrostat Place 1 tablet (0.4 mg total) under the tongue every 5 (five) minutes as needed.   ondansetron 4 MG tablet Commonly known as: ZOFRAN Take 1 tablet (4 mg total) by mouth every 6 (six) hours as needed for nausea.   pantoprazole 40 MG tablet Commonly known as: Protonix Take 1 tablet (40 mg total) by mouth 2 (two) times daily. What changed: See the new instructions.   potassium chloride 10 MEQ tablet Commonly known as: KLOR-CON Take 10 mEq by mouth every Monday, Wednesday, and Friday.   rosuvastatin 20 MG tablet Commonly known as: CRESTOR Take 1 tablet (20 mg total) by mouth daily at 6 PM.   SOOTHE XP OP Apply 1 drop to eye as needed (dry eyes).   VITAMIN B-12 PO Take 1 tablet by mouth daily.        LABORATORY STUDIES CBC    Component Value Date/Time   WBC 8.1 11/24/2019 0301   RBC 2.99 (L) 11/24/2019 0301   HGB 9.2 (L) 11/24/2019 0301   HGB 13.2 02/21/2018 1221   HCT 30.2 (L) 11/24/2019 0301   HCT 40.3 02/21/2018 1221   PLT 451 (H) 11/24/2019 0301   PLT 357 02/21/2018 1221   MCV 101.0 (H) 11/24/2019 0301   MCV 97 02/21/2018 1221   MCH 30.8 11/24/2019 0301   MCHC 30.5 11/24/2019 0301   RDW 14.0 11/24/2019 0301   RDW 14.0 02/21/2018 1221   LYMPHSABS 0.5 (L) 11/20/2019 0500   MONOABS 0.4 11/20/2019 0500   EOSABS 0.0 11/20/2019 0500   BASOSABS 0.0 11/20/2019 0500   CMP    Component Value Date/Time   NA 139 11/24/2019 0301   NA 140 02/21/2018 1221   K 3.9 11/24/2019 0301   CL 110 11/24/2019 0301   CO2 20 (L) 11/24/2019 0301   GLUCOSE 126 (H) 11/24/2019 0301   BUN 14 11/24/2019 0301   BUN 16 02/21/2018 1221   CREATININE 0.75 11/24/2019 0301   CALCIUM 8.2 (L) 11/24/2019 0301   PROT 6.5 11/19/2019 0432   ALBUMIN 2.7 (L) 11/19/2019 0432   AST 22 11/19/2019 0432   ALT 14 11/19/2019 0432    ALKPHOS 66 11/19/2019 0432   BILITOT 0.4 11/19/2019 0432   GFRNONAA >60 11/24/2019 0301   GFRAA >60 11/24/2019 0301   COAGS Lab Results  Component Value Date   INR 1.0 11/19/2019   INR 1.1 11/12/2019   INR 0.95 09/09/2015   Lipid Panel    Component Value Date/Time   CHOL 81 11/20/2019 0500   TRIG 67 11/20/2019 0500   HDL 32 (L) 11/20/2019 0500   CHOLHDL 2.5 11/20/2019 0500   VLDL 13 11/20/2019 0500   LDLCALC 36 11/20/2019 0500   HgbA1C  Lab Results  Component Value Date   HGBA1C 6.9 (H) 11/20/2019   Urinalysis    Component  Value Date/Time   COLORURINE YELLOW 11/19/2019 0900   APPEARANCEUR CLEAR 11/19/2019 0900   LABSPEC >1.046 (H) 11/19/2019 0900   PHURINE 5.0 11/19/2019 0900   GLUCOSEU NEGATIVE 11/19/2019 0900   HGBUR NEGATIVE 11/19/2019 0900   BILIRUBINUR NEGATIVE 11/19/2019 0900   KETONESUR 20 (A) 11/19/2019 0900   PROTEINUR NEGATIVE 11/19/2019 0900   UROBILINOGEN 1.0 09/09/2015 1301   NITRITE NEGATIVE 11/19/2019 0900   LEUKOCYTESUR NEGATIVE 11/19/2019 0900   Urine Drug Screen     Component Value Date/Time   LABOPIA NONE DETECTED 11/19/2019 0900   COCAINSCRNUR NONE DETECTED 11/19/2019 0900   LABBENZ NONE DETECTED 11/19/2019 0900   AMPHETMU NONE DETECTED 11/19/2019 0900   THCU NONE DETECTED 11/19/2019 0900   LABBARB NONE DETECTED 11/19/2019 0900    Alcohol Level    Component Value Date/Time   ETH <10 11/19/2019 0432     SIGNIFICANT DIAGNOSTIC STUDIES   CT Code Stroke CTA Head W/WO contrast 11/19/2019 CLINICAL DATA:  Right MCA syndrome EXAM: CT ANGIOGRAPHY HEAD AND NECK CT PERFUSION BRAIN TECHNIQUE: Multidetector CT imaging of the head and neck was performed using the standard protocol during bolus administration of intravenous contrast. Multiplanar CT image reconstructions and MIPs were obtained to evaluate the vascular anatomy. Carotid stenosis measurements (when applicable) are obtained utilizing NASCET criteria, using the distal internal carotid  diameter as the denominator. Multiphase CT imaging of the brain was performed following IV bolus contrast injection. Subsequent parametric perfusion maps were calculated using RAPID software. CONTRAST:  153mL OMNIPAQUE IOHEXOL 350 MG/ML SOLN COMPARISON:  Head CT from earlier today FINDINGS: CTA NECK FINDINGS Aortic arch: Atherosclerotic plaque with 3 vessel branching. 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 The LIMA and upper venous graft are patent. Right carotid system: Bulky calcified plaque at the bifurcation without flow limiting stenosis or ulceration. No downstream beading or dissection. Left carotid system: Remarkably bulky calcified plaque at the bifurcation. ICA stenosis measures 75% on coronal reformats. No ulceration or dissection. Vertebral arteries: Proximal subclavian atherosclerosis without flow limiting stenosis. Atherosclerotic plaque on the dominant left V1 segment and V2 segment without flow limiting stenosis or beading. Faint flow in the non dominant right vertebral artery. Skeleton: Disc and facet degeneration. No acute or aggressive finding Other neck: Frothy secretions in the trachea. No acute or aggressive finding. Upper chest: Calcified granulomas in the apical lungs. Review of the MIP images confirms the above findings CTA HEAD FINDINGS Anterior circulation: Atherosclerotic plaque along both carotid siphons which is confluent. There is a severe stenosis at the supraclinoid right ICA segment with small vessel size limiting accurate measurement. No downstream branch occlusion although there is less well filled right MCA branches. The paraclinoid left ICA is moderately stenotic and likely overestimated visually due to degree of calcified plaque blooming. Stenosis is likely 50% or less. No downstream branch occlusion or beading. Atheromatous irregularity of medium size vessels. Posterior circulation: Strong left vertebral artery dominance. There is faint thready flow in the right vertebral artery with no  meaningful contribution to the basilar. There is a high-grade short segment narrowing at the left vertebrobasilar junction. Abrupt loss of flow within the distal basilar with no flow seen in the P1 segments and throughout the majority of the right PCA territory. There is a moderate-sized left posterior communicating artery. Less intense enhancement is seen within the vertebrobasilar arteries compared to the carotids. Distal basilar occlusion is new from flow voids in 2014 brain MRI Venous sinuses: Unremarkable in the arterial phase Anatomic variants: As above Review of the MIP  images confirms the above findings CT Brain Perfusion Findings: ASPECTS: 10 CBF (<30%) Volume: 98mL Perfusion (Tmax>6.0s) volume: 69mL Ischemia is seen in the right PCA territory These results were called by telephone at the time of interpretation on 11/19/2019 at 5:18 am to provider [facility] , who verbally acknowledged these results. IMPRESSION: 1. 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 2. Severe right supraclinoid ICA stenosis with right MCA branch underfilling, chronic appearing 3. High-grade narrowing at the left vertebrobasilar junction. No meaningful contribution to the basilar by the non dominant right vertebral artery. 4. Heavily calcified carotid bifurcations, especially on the left where ICA stenosis measures 75%. Electronically Signed   By: Monte Fantasia M.D.   On: 11/19/2019 05:24   CT Code Stroke CTA Neck W/WO contrast 11/19/2019 CLINICAL DATA:  Right MCA syndrome EXAM: CT ANGIOGRAPHY HEAD AND NECK CT PERFUSION BRAIN TECHNIQUE: Multidetector CT imaging of the head and neck was performed using the standard protocol during bolus administration of intravenous contrast. Multiplanar CT image reconstructions and MIPs were obtained to evaluate the vascular anatomy. Carotid stenosis measurements (when applicable) are obtained utilizing NASCET  criteria, using the distal internal carotid diameter as the denominator. Multiphase CT imaging of the brain was performed following IV bolus contrast injection. Subsequent parametric perfusion maps were calculated using RAPID software. CONTRAST:  161mL OMNIPAQUE IOHEXOL 350 MG/ML SOLN COMPARISON:  Head CT from earlier today FINDINGS: CTA NECK FINDINGS Aortic arch: Atherosclerotic plaque with 3 vessel branching. 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 The LIMA and upper venous graft are patent. Right carotid system: Bulky calcified plaque at the bifurcation without flow limiting stenosis or ulceration. No downstream beading or dissection. Left carotid system: Remarkably bulky calcified plaque at the bifurcation. ICA stenosis measures 75% on coronal reformats. No ulceration or dissection. Vertebral arteries: Proximal subclavian atherosclerosis without flow limiting stenosis. Atherosclerotic plaque on the dominant left V1 segment and V2 segment without flow limiting stenosis or beading. Faint flow in the non dominant right vertebral artery. Skeleton: Disc and facet degeneration. No acute or aggressive finding Other neck: Frothy secretions in the trachea. No acute or aggressive finding. Upper chest: Calcified granulomas in the apical lungs. Review of the MIP images confirms the above findings CTA HEAD FINDINGS Anterior circulation: Atherosclerotic plaque along both carotid siphons which is confluent. There is a severe stenosis at the supraclinoid right ICA segment with small vessel size limiting accurate measurement. No downstream branch occlusion although there is less well filled right MCA branches. The paraclinoid left ICA is moderately stenotic and likely overestimated visually due to degree of calcified plaque blooming. Stenosis is likely 50% or less. No downstream branch occlusion or beading. Atheromatous irregularity of medium size vessels. Posterior circulation: Strong left vertebral artery dominance. There is faint thready flow  in the right vertebral artery with no meaningful contribution to the basilar. There is a high-grade short segment narrowing at the left vertebrobasilar junction. Abrupt loss of flow within the distal basilar with no flow seen in the P1 segments and throughout the majority of the right PCA territory. There is a moderate-sized left posterior communicating artery. Less intense enhancement is seen within the vertebrobasilar arteries compared to the carotids. Distal basilar occlusion is new from flow voids in 2014 brain MRI Venous sinuses: Unremarkable in the arterial phase Anatomic variants: As above Review of the MIP images confirms the above findings CT Brain Perfusion Findings: ASPECTS: 10 CBF (<30%) Volume: 109mL Perfusion (Tmax>6.0s) volume:  64mL Ischemia is seen in the right PCA territory These results were called by telephone at the time of interpretation on 11/19/2019 at 5:18 am to provider [facility] , who verbally acknowledged these results. IMPRESSION: 1. 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 2. Severe right supraclinoid ICA stenosis with right MCA branch underfilling, chronic appearing 3. High-grade narrowing at the left vertebrobasilar junction. No meaningful contribution to the basilar by the non dominant right vertebral artery. 4. Heavily calcified carotid bifurcations, especially on the left where ICA stenosis measures 75%. Electronically Signed   By: Monte Fantasia M.D.   On: 11/19/2019 05:24   MR MRA HEAD WO CONTRAST Result Date: 11/19/2019 CLINICAL DATA:  Stroke, follow-up EXAM: MRI HEAD WITHOUT CONTRAST MRA HEAD WITHOUT CONTRAST TECHNIQUE: Multiplanar, multiecho pulse sequences of the brain and surrounding structures were obtained without intravenous contrast. Angiographic images of the head were obtained using MRA technique without contrast. COMPARISON:  Recent CT imaging FINDINGS: MRI HEAD FINDINGS Brain: There  are multiple small foci of reduced diffusion including involvement of the right hippocampus, right occipital lobe, left frontal lobe, left occipital lobe, bilateral cerebellum, and possibly the right thalamus. No evidence of intracranial hemorrhage. Prominence of the ventricles and sulci reflects generalized parenchymal volume loss. Patchy and confluent areas of T2 hyperintensity in the supratentorial white matter are nonspecific but probably reflect moderate chronic microvascular ischemic changes. There are chronic small vessel infarcts of the basal ganglia. There is no intracranial mass or mass effect. No extra-axial fluid collection. Vascular: Major vessel flow voids at the skull base are preserved. Skull and upper cervical spine: Normal marrow signal. Sinuses/Orbits: Minor paranasal sinus mucosal thickening. Bilateral lens replacements. Other: Sella is unremarkable. MRA HEAD FINDINGS There is preserved flow related enhancement of the intracranial internal carotid arteries. Atherosclerotic irregularity is present along the cavernous and paraclinoid portions causing moderate stenosis. Proximal anterior and middle cerebral arteries are patent. Intracranial vertebral arteries are patent with atherosclerotic irregularity. The left vertebral artery is dominant. There appears to be splitting of the distal right vertebral artery. Flow within the right vertebral artery appears improved relative to the CTA. There is preserved flow related enhancement within the basilar artery and the proximal posterior cerebral arteries. A left posterior communicating arteries again identified. IMPRESSION: Several small foci of acute infarction as described, likely embolic. No evidence of hemorrhage. Compared to prior CTA, there is improved flow within the intracranial right vertebral artery, distal basilar artery, and right posterior cerebral artery. Chronic microvascular ischemic changes. Electronically Signed   By: Macy Mis M.D.    On: 11/19/2019 14:19   MR BRAIN WO CONTRAST 11/19/2019 CLINICAL DATA:  Stroke, follow-up EXAM: MRI HEAD WITHOUT CONTRAST MRA HEAD WITHOUT CONTRAST TECHNIQUE: Multiplanar, multiecho pulse sequences of the brain and surrounding structures were obtained without intravenous contrast. Angiographic images of the head were obtained using MRA technique without contrast. COMPARISON:  Recent CT imaging FINDINGS: MRI HEAD FINDINGS Brain: There are multiple small foci of reduced diffusion including involvement of the right hippocampus, right occipital lobe, left frontal lobe, left occipital lobe, bilateral cerebellum, and possibly the right thalamus. No evidence of intracranial hemorrhage. Prominence of the ventricles and sulci reflects generalized parenchymal volume loss. Patchy and confluent areas of T2 hyperintensity in the supratentorial white matter are nonspecific but probably reflect moderate chronic microvascular ischemic changes. There are chronic small vessel infarcts of the basal ganglia. There is no intracranial mass or mass effect.  No extra-axial fluid collection. Vascular: Major vessel flow voids at the skull base are preserved. Skull and upper cervical spine: Normal marrow signal. Sinuses/Orbits: Minor paranasal sinus mucosal thickening. Bilateral lens replacements. Other: Sella is unremarkable. MRA HEAD FINDINGS There is preserved flow related enhancement of the intracranial internal carotid arteries. Atherosclerotic irregularity is present along the cavernous and paraclinoid portions causing moderate stenosis. Proximal anterior and middle cerebral arteries are patent. Intracranial vertebral arteries are patent with atherosclerotic irregularity. The left vertebral artery is dominant. There appears to be splitting of the distal right vertebral artery. Flow within the right vertebral artery appears improved relative to the CTA. There is preserved flow related enhancement within the basilar artery and the  proximal posterior cerebral arteries. A left posterior communicating arteries again identified. IMPRESSION: Several small foci of acute infarction as described, likely embolic. No evidence of hemorrhage. Compared to prior CTA, there is improved flow within the intracranial right vertebral artery, distal basilar artery, and right posterior cerebral artery. Chronic microvascular ischemic changes. Electronically Signed   By: Macy Mis M.D.   On: 11/19/2019 14:19   CT Abdomen Pelvis W Contrast 11/02/2019 CLINICAL DATA:  Abdominal distension. EXAM: CT ABDOMEN AND PELVIS WITH CONTRAST TECHNIQUE: Multidetector CT imaging of the abdomen and pelvis was performed using the standard protocol following bolus administration of intravenous contrast. CONTRAST:  116mL OMNIPAQUE IOHEXOL 300 MG/ML  SOLN COMPARISON:  09/10/2017 FINDINGS: Lower chest: Large hiatal hernia similar to prior study. Signs of mitral annular calcification and coronary artery disease. No signs of consolidation or evidence of pleural effusion. Hepatobiliary: Signs of hepatic cysts. No suspicious focal hepatic lesion. Gallbladder mildly distended without signs of pericholecystic fluid. No signs of biliary ductal dilation. Pancreas: Scattered calcific changes of the pancreas similar to prior study. Subtle 9 mm low-attenuation focus in the pancreas appears cystic, potentially related to prior pancreatitis given the presence of calcifications. Spleen: Normal in size without focal abnormality. Adrenals/Urinary Tract: Mild thickening of the adrenal glands. Nonobstructing calculus lower pole right kidney measuring 8 mm. No signs of hydronephrosis. No signs of ureteral calculus on today's exam. No suspicious renal lesion. Stomach/Bowel: Organo-axial gastric volvulus without adjacent gastric stranding, stomach is incompletely imaged. Configuration unchanged since prior exam. Transverse segmental colonic thickening with signs of diverticulosis. Signs of left  hemicolectomy. No signs of pneumatosis. Normal appendix. Vascular/Lymphatic: Calcified atherosclerosis of the abdominal aorta. No signs of aneurysm. No signs of adenopathy. Reproductive: Prostate unremarkable by CT. Other: No free air or abscess. Musculoskeletal: No sign of acute or destructive bone process. IMPRESSION: 1. Segmental colitis in the setting of diverticulosis in the transverse colon, likely diverticulitis. Period colonic thickening is noted with adjacent small lymph nodes but no adenopathy. CT follow-up may be helpful to ensure resolution of changes and determine whether direct visualization may be warranted given changes of colonic thickening. 2. Signs of left hemicolectomy. 3. Stable large hiatal hernia. 4. Nonobstructing right lower pole renal calculus. 5. Signs of chronic calcific pancreatitis. Small cystic focus suggested in the midportion of the pancreas. CT follow-up could be utilized for this finding as well when assessing colonic findings. 6. Aortic atherosclerosis. 7. Signs of mitral annular calcification and coronary artery disease. Aortic Atherosclerosis (ICD10-I70.0). Electronically Signed   By: Zetta Bills M.D.   On: 11/02/2019 12:26   CARDIAC CATHETERIZATION 11/12/2019  Ost RCA to Prox RCA lesion is 100% stenosed.  Ost LM to Mid LM lesion is 100% stenosed.  Prox Graft to Mid Graft lesion is 95% stenosed.  A  stent was successfully placed.  Post intervention, there is a 0% residual stenosis.  Origin to Prox Graft lesion is 99% stenosed.  Post intervention, there is a 0% residual stenosis.  Dist LM to Prox LAD lesion is 100% stenosed with 100% stenosed side branch in Ost Cx to Dist Cx.  RPDA lesion is 100% stenosed.  Severe native CAD with total occlusion of the left main and ostial RCA. Patent LIMA graft supplying the LAD Patent SVG supplying the obtuse marginal vessel with collateralization to the distal diagonal vessel Stents of thrombotic occlusion of the vein graft  supplying a large dominant RCA PDA system. Successful PCI to the SVG supplying the PDA requiring extensive percutaneous thrombectomy ultimate DES stenting with a 3.5 x 38 mm Resolute stent with the stenoses being reduced to 0%.  There is abrupt cut off of the very distal PDA most likely due to embolic occlusion. Hand-injection left ventriculography revealed an EF estimate at 35 to 40%.  There is moderate inferior hypocontractility and a small focal region of apical akinesis.  LVEDP is 10 mm. RECOMMENDATION; The patient received ticagrelor upon leaving the lab with plans to discontinue cangrelor once in the ICU.  Plan to discontinue Aggrastat after 2 to 4 hours and bivalirudin upon completion of his current bag.  The case was discussed at length with Dr. Tamala Julian who follows the patient.  With his GI history other than 12 months of DAPT, will plan for short-term antiplatelet therapy per Resolute Onyx stent data.   IR CT Head Ltd 11/20/2019 INDICATION: New onset left weakness, left-sided visual field deficit and left sided neglect. CT angiogram of the head and neck revealed distal basilar artery occlusion, right posterior cerebral artery occlusion. EXAM: 1. EMERGENT LARGE VESSEL OCCLUSION THROMBOLYSIS (POSTERIOR CIRCULATION) COMPARISON:  CT angiogram of the head and neck of 09/19/2019. MEDICATIONS: Vancomycin 1 g IV antibiotic was administered within 1 hour of the procedure. ANESTHESIA/SEDATION: General anesthesia. CONTRAST:  Isovue 300 approximately 65 mL. FLUOROSCOPY TIME:  Fluoroscopy Time: 29 minutes 36 seconds (1285 mGy). COMPLICATIONS: None immediate. TECHNIQUE: Following a full explanation of the procedure along with the potential associated complications, an informed witnessed consent was obtained from patient's daughter. The risks of intracranial hemorrhage of 10%, worsening neurological deficit, ventilator dependency, death and inability to revascularize were all reviewed in detail with the patient's  daughter. The patient was then put under general anesthesia by the Department of Anesthesiology at [facility]. The right groin was prepped and draped in the usual sterile fashion. Thereafter using modified Seldinger technique, transfemoral access into the right common femoral artery was obtained without difficulty. Over a 0.035 inch guidewire a 5 French Pinnacle sheath was inserted. Through this, and also over a 0.035 inch guidewire a 5 [place] JB 1 catheter was advanced to the aortic arch region and selectively positioned in the left subclavian artery and the left vertebral artery artery. FINDINGS: The left subclavian arteriogram demonstrates wide patency of the left vertebral artery at its origin. There is a loop-shaped configuration of the proximal left vertebral artery. More distally, the vessel is seen to opacify to the cranial skull base. Wide patency is seen of the left vertebrobasilar junction and the left posterior-inferior cerebellar artery. Tapered narrowing of approximately 50-60% is seen of proximal basilar artery at its confluence with the vertebrobasilar artery. Distal to this the basilar artery demonstrates wide patency to just proximal to the origin of the superior cerebellar arteries. Distal to this there is complete angiographic occlusion of the distal  basilar artery. Nonvisualization of the posterior cerebral arteries and partially the superior cerebellar arteries is seen. PROCEDURE: The diagnostic JB 1 catheter in the distal left subclavian artery was exchanged over a 0.035 inch 300 cm Rosen exchange guidewire for an 8 French 80 cm Neuron Max sheath using biplane roadmap technique and constant fluoroscopic guidance. Good aspiration obtained from the hub of the Neuron Max sheath following removal of the exchange guidewire. Good aspiration was obtained from the hub of the Neuron Max sheath. A gentle contrast injection demonstrated no evidence of spasms, dissections or of intraluminal  filling defects. The Neruon Max sheath was gently retrieved more proximally to engage the origin of the left vertebral artery. Over a 0.035 inch Roadrunner guidewire, using biplane roadmap technique and constant fluoroscopic guidance, a 132 cm 6 [place] Catalyst guide catheter was then advanced without difficulty to the distal left vertebral artery followed by the advancement of the Neuron Max sheath into the proximal left vertebral artery. The guidewire was removed. Good aspiration obtained from the hub of the 6 [place] Catalyst guide catheter. Control arteriogram performed through this demonstrated no evidence of spasms, dissections or of intraluminal filling defects. A biplane roadmap was then obtained centered over the basilar artery. Over a 0.014 inch Synchro standard micro guidewire with a J configuration, a 150 cm Trevo ProVue microcatheter was advanced to the distal end of the Catalyst guide catheter. With the micro guidewire leading with a J-tip configuration, the combination was navigated without difficulty through 50-60% stenosis at the junction of the left vertebrobasilar junction to the proximal basilar artery. The micro guidewire was then gently manipulated without much resistance into the left posterior cerebral artery P2 P3 region followed by the microcatheter. At this time, the Catalyst guide catheter was advanced to just proximal to the occluded distal basilar artery. The guidewire was removed. Good aspiration obtained from the hub of the microcatheter. A gentle control arteriogram performed through the microcatheter demonstrated safe position of tip of the microcatheter, which was connected to continuous heparinized saline infusion. A 4 mm x 40 mm Solitaire X retrieval device was then advanced to the distal end of the microcatheter. The O ring on the delivery microcatheter was then loosened. With slight forward gentle traction with the right hand on the delivery micro guidewire with the left hand  the delivery microcatheter was retrieved unsheathing the distal and then the proximal portion of the retrieval device. The 8 [place] Catalyst guide catheter was now advanced into the orifice of the left posterior cerebral artery where no aspiration of blood was seen into the Penumbra aspiration tubing. This combination was maintained for approximately 2 minutes and 10 seconds. Thereafter, the microcatheter was retrieved proximally. The retrieval device was then gently retrieved more proximally in combination with the 6 [place] Catalyst guide catheter where a complete occlusion to aspiration was maintained as the combination was removed with constant aspiration being applied at the hub of the Neuron Max sheath. The retrieval device, and the micro guidewire were retrieved and removed. There was free aspiration through the Catalyst guide catheter which was maintained in the left vertebrobasilar junction. Specks of clot were seen in the Penumbra collection device. A control arteriogram performed through the 6 [place] Catalyst guide catheter in the left vertebral artery demonstrated complete angiographic revascularization of the distal basilar artery, the posterior cerebral arteries and the superior cerebellar arteries which were duplicated in anatomic variation. A TICI 3 revascularization was achieved. A control arteriogram performed following removal of the Catalyst guide  catheter from the Neuron Max sheath demonstrated patency of the left vertebral artery extra cranially and intracranially. Also noted was stable 50-60% stenosis at the junction of the left vertebrobasilar junction with the proximal basilar artery. Areas of caliber irregularity were seen in the posterior cerebral arteries bilaterally worse on the left side consistent with intracranial arteriosclerosis. However, a TICI 3 revascularization was maintained. Throughout the procedure, the patient's blood pressure and neurological status remained stable. No  evidence of mass effect or extravasation was seen. The Neuron Max sheath was then removed. The 8 French Pinnacle sheath was then removed from the right groin puncture site. Hemostasis was achieved with manual compression over about 20-30 minutes. A CT scan of brain obtained demonstrated no evidence of mass effect, midline shift or intracranial hemorrhage. The patient's general anesthesia was then reversed with patient extubated without much difficulty. Upon recovery, the patient was able to obey simple commands and move all fours spontaneously and to command. He was then transferred to the neuro ICU for post thrombectomy management. IMPRESSION: Status post endovascular complete revascularization of the distal basilar artery, the posterior cerebral arteries, the superior cerebellar arteries with 1 pass with the 4 mm x 40 mm Solitaire X retrieval device and Penumbra aspiration achieving a TICI 3 revascularization. PLAN: Follow-up in the clinic 4 weeks post discharge. Electronically Signed   By: Luanne Bras M.D.   On: 11/19/2019 16:05   CT C-SPINE NO CHARGE 11/19/2019 CLINICAL DATA:  Stroke follow-up. EXAM: CT CERVICAL SPINE WITHOUT CONTRAST TECHNIQUE: Multidetector CT imaging of the cervical spine was performed without intravenous contrast. Multiplanar CT image reconstructions were also generated. COMPARISON:  Cervical radiography 04/26/2011 FINDINGS: Alignment: No traumatic malalignment Skull base and vertebrae: Negative for acute fracture. Soft tissues and spinal canal: No prevertebral fluid or swelling. No visible canal hematoma. Disc levels: Usual degenerative changes with mainly lower cervical disc narrowing and endplate ridging. No evidence of impingement Upper chest: No acute finding IMPRESSION: No evidence of cervical spine injury. Electronically Signed   By: Monte Fantasia M.D.   On: 11/19/2019 06:54   CT Code Stroke Cerebral Perfusion with contrast 11/19/2019 CLINICAL DATA:  Right MCA  syndrome EXAM: CT ANGIOGRAPHY HEAD AND NECK CT PERFUSION BRAIN TECHNIQUE: Multidetector CT imaging of the head and neck was performed using the standard protocol during bolus administration of intravenous contrast. Multiplanar CT image reconstructions and MIPs were obtained to evaluate the vascular anatomy. Carotid stenosis measurements (when applicable) are obtained utilizing NASCET criteria, using the distal internal carotid diameter as the denominator. Multiphase CT imaging of the brain was performed following IV bolus contrast injection. Subsequent parametric perfusion maps were calculated using RAPID software. CONTRAST:  125mL OMNIPAQUE IOHEXOL 350 MG/ML SOLN COMPARISON:  Head CT from earlier today FINDINGS: CTA NECK FINDINGS Aortic arch: Atherosclerotic plaque with 3 vessel branching. 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 The LIMA and upper venous graft are patent. Right carotid system: Bulky calcified plaque at the bifurcation without flow limiting stenosis or ulceration. No downstream beading or dissection. Left carotid system: Remarkably bulky calcified plaque at the bifurcation. ICA stenosis measures 75% on coronal reformats. No ulceration or dissection. Vertebral arteries: Proximal subclavian atherosclerosis without flow limiting stenosis. Atherosclerotic plaque on the dominant left V1 segment and V2 segment without flow limiting stenosis or beading. Faint flow in the non dominant right vertebral artery. Skeleton: Disc and facet degeneration. No acute or aggressive finding Other neck: Frothy secretions in the trachea. No acute or aggressive finding. Upper chest: Calcified granulomas in the apical  lungs. Review of the MIP images confirms the above findings CTA HEAD FINDINGS Anterior circulation: Atherosclerotic plaque along both carotid siphons which is confluent. There is a severe stenosis at the supraclinoid right ICA segment with small vessel size limiting accurate measurement. No downstream branch occlusion although  there is less well filled right MCA branches. The paraclinoid left ICA is moderately stenotic and likely overestimated visually due to degree of calcified plaque blooming. Stenosis is likely 50% or less. No downstream branch occlusion or beading. Atheromatous irregularity of medium size vessels. Posterior circulation: Strong left vertebral artery dominance. There is faint thready flow in the right vertebral artery with no meaningful contribution to the basilar. There is a high-grade short segment narrowing at the left vertebrobasilar junction. Abrupt loss of flow within the distal basilar with no flow seen in the P1 segments and throughout the majority of the right PCA territory. There is a moderate-sized left posterior communicating artery. Less intense enhancement is seen within the vertebrobasilar arteries compared to the carotids. Distal basilar occlusion is new from flow voids in 2014 brain MRI Venous sinuses: Unremarkable in the arterial phase Anatomic variants: As above Review of the MIP images confirms the above findings CT Brain Perfusion Findings: ASPECTS: 10 CBF (<30%) Volume: 45mL Perfusion (Tmax>6.0s) volume: 60mL Ischemia is seen in the right PCA territory These results were called by telephone at the time of interpretation on 11/19/2019 at 5:18 am to provider [facility] , who verbally acknowledged these results. IMPRESSION: 1. 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 2. Severe right supraclinoid ICA stenosis with right MCA branch underfilling, chronic appearing 3. High-grade narrowing at the left vertebrobasilar junction. No meaningful contribution to the basilar by the non dominant right vertebral artery. 4. Heavily calcified carotid bifurcations, especially on the left where ICA stenosis measures 75%. Electronically Signed   By: Monte Fantasia M.D.   On: 11/19/2019 05:24   DG CHEST PORT 1 VIEW 11/20/2019 CLINICAL  DATA:  Lethargy.  Dysarthria. EXAM: PORTABLE CHEST 1 VIEW COMPARISON:  Single-view of the chest 09/10/2017. FINDINGS: The patient is status post CABG. Heart size is upper normal. No edema or consolidative process. Large hiatal hernia is again seen. No pneumothorax or pleural effusion. No acute or focal bony abnormality. IMPRESSION: No acute disease. Cardiomegaly. Atherosclerosis. Hiatal hernia. Electronically Signed   By: Inge Rise M.D.   On: 11/20/2019 14:36   ECHOCARDIOGRAM COMPLETE 11/19/2019   ECHOCARDIOGRAM REPORT   Patient Name:   Kenneth [name] Date of Exam: 11/19/2019 Medical Rec #:  [id number]        Height:       70.0 in Accession #:    [id number]       Weight:       148.1 lb Date of Birth:  [birth year]        BSA:          1.84 m Patient Age:    85 years         BP:           124/101 mmHg Patient Gender: M                HR:           106 bpm. Exam Location:  Inpatient Procedure: 2D Echo Indications:    Stroke 434.91 / I163.9  History:        Patient has prior history of Echocardiogram examinations, most  recent 11/12/2019. Acute ST elevation myocardial infarction                 (STEMI) of posterior wall and CAD; Risk Factors:Hypertension,                 Diabetes and Dyslipidemia. Elevated troponin.  Sonographer:    Vikki Ports Turrentine Referring Phys: [place]  1. Left ventricular ejection fraction, by visual estimation, is 20 to 25%. The left ventricle has normal function. There is no left ventricular hypertrophy.  2. Left ventricular diastolic parameters are indeterminate.  3. The left ventricle demonstrates global hypokinesis.  4. COmpared to previous echo on 11/12/19, LVEF is unchanged.  5. Global right ventricle mild to moderately reduced..The right ventricular size is normal. No increase in right ventricular wall thickness.  6. Left atrial size was moderately dilated.  7. Right atrial size was normal.  8. The mitral valve is grossly normal.  Moderate mitral valve regurgitation.  9. The tricuspid valve is normal in structure. Tricuspid valve regurgitation is trivial. 10. The aortic valve is tricuspid. Aortic valve regurgitation is not visualized. Mild aortic valve stenosis. 11. The pulmonic valve was normal in structure. Pulmonic valve regurgitation is not visualized. 12. The aortic root was not well visualized. 13. The inferior vena cava is normal in size with greater than 50% respiratory variability, suggesting right atrial pressure of 3 mmHg. FINDINGS  Left Ventricle: Left ventricular ejection fraction, by visual estimation, is 20 to 25%. The left ventricle has normal function. The left ventricle demonstrates global hypokinesis. There is no left ventricular hypertrophy. Left ventricular diastolic parameters are indeterminate. COmpared to previous echo on 11/12/19, LVEF is unchanged. Right Ventricle: The right ventricular size is normal. No increase in right ventricular wall thickness. Global RV systolic function is mild to moderately reduced. Left Atrium: Left atrial size was moderately dilated. Right Atrium: Right atrial size was normal in size Pericardium: There is no evidence of pericardial effusion. Mitral Valve: The mitral valve is grossly normal. Moderate mitral valve regurgitation. Tricuspid Valve: The tricuspid valve is normal in structure. Tricuspid valve regurgitation is trivial. Aortic Valve: The aortic valve is tricuspid. Aortic valve regurgitation is not visualized. Mild aortic stenosis is present. Pulmonic Valve: The pulmonic valve was normal in structure. Pulmonic valve regurgitation is not visualized. Pulmonic regurgitation is not visualized. Aorta: The aortic root was not well visualized. Venous: The inferior vena cava is normal in size with greater than 50% respiratory variability, suggesting right atrial pressure of 3 mmHg. IAS/Shunts: No atrial level shunt detected by color flow Doppler.  LEFT VENTRICLE PLAX 2D LVIDd:         4.90  cm LVIDs:         4.40 cm LV PW:         1.00 cm LV IVS:        1.00 cm LVOT diam:     2.30 cm LV SV:         25 ml LV SV Index:   13.82 LVOT Area:     4.15 cm  LEFT ATRIUM              Index       RIGHT ATRIUM           Index LA diam:        4.40 cm  2.39 cm/m  RA Area:     14.60 cm LA Vol (A2C):   103.0 ml 56.06 ml/m RA Volume:   37.70  ml  20.52 ml/m LA Vol (A4C):   75.0 ml  40.82 ml/m LA Biplane Vol: 90.1 ml  49.04 ml/m  AORTIC VALVE LVOT Vmax:   70.70 cm/s LVOT Vmean:  48.133 cm/s LVOT VTI:    0.121 m  AORTA Ao Root diam: 3.20 cm  SHUNTS Systemic VTI:  0.12 m Systemic Diam: 2.30 cm  Dorris Carnes MD Electronically signed by Dorris Carnes MD Signature Date/Time: 11/19/2019/12:02:49 PM    Final    ECHOCARDIOGRAM COMPLETE 11/12/2019   ECHOCARDIOGRAM REPORT   Patient Name:   KEONTA ROOSEVELT Date of Exam: 11/12/2019 Medical Rec #:  [id number]        Height:       70.0 in Accession #:    [id number]       Weight:       147.7 lb Date of Birth:  [birth year]        BSA:          1.84 m Patient Age:    19 years         BP:           149/81 mmHg Patient Gender: M                HR:           98 bpm. Exam Location:  Inpatient Procedure: 2D Echo and Intracardiac Opacification Agent Indications:    Acute myocardial infarction 410  History:        Patient has prior history of Echocardiogram examinations, most                 recent 02/21/2018. CAD, Prior CABG; Risk Factors:Hypertension,                 Dyslipidemia and Diabetes.  Sonographer:    Mikki Santee RDCS (AE) Referring Phys: [place]  1. Severely reduce LVEF, with akinesis and hypokinesis in septal and inferior walls. Changed from prior echo in 2019.  2. Left ventricular ejection fraction, by visual estimation, is 20 to 25%. The left ventricle has severely decreased function. There is no left ventricular hypertrophy.  3. Multiple segmental abnormalities exist. See findings.  4. Definity contrast agent was given IV to delineate the left  ventricular endocardial borders.  5. Left ventricular diastolic parameters are indeterminate.  6. The left ventricle demonstrates regional wall motion abnormalities.  7. Global right ventricle has moderately reduced systolic function.The right ventricular size is normal. No increase in right ventricular wall thickness.  8. Left atrial size was mildly dilated.  9. Right atrial size was mildly dilated. 10. The mitral valve is grossly normal. Mild mitral valve regurgitation. 11. The tricuspid valve is normal in structure. Tricuspid valve regurgitation is trivial. 12. The aortic valve is tricuspid. Aortic valve regurgitation is not visualized. Mild aortic valve sclerosis without stenosis. 13. The pulmonic valve was not well visualized. Pulmonic valve regurgitation NA. FINDINGS  Left Ventricle: Left ventricular ejection fraction, by visual estimation, is 20 to 25%. The left ventricle has severely decreased function. Definity contrast agent was given IV to delineate the left ventricular endocardial borders. The left ventricle demonstrates regional wall motion abnormalities. There is no left ventricular hypertrophy. Left ventricular diastolic parameters are indeterminate.  LV Wall Scoring: The basal and mid inferior septum and basal anteroseptal segment are akinetic. The entire inferior wall, mid and apical anterior septum, and basal and mid inferolateral wall are hypokinetic. All remaining scored segments are normal. Right Ventricle: The right ventricular  size is normal. No increase in right ventricular wall thickness. Global RV systolic function is has moderately reduced systolic function. Left Atrium: Left atrial size was mildly dilated. Right Atrium: Right atrial size was mildly dilated Pericardium: There is no evidence of pericardial effusion. Mitral Valve: The mitral valve is grossly normal. Mild mitral valve regurgitation. Tricuspid Valve: The tricuspid valve is normal in structure. Tricuspid valve regurgitation is  trivial. Aortic Valve: The aortic valve is tricuspid. Aortic valve regurgitation is not visualized. Mild aortic valve sclerosis is present, with no evidence of aortic valve stenosis. Pulmonic Valve: The pulmonic valve was not well visualized. Pulmonic valve regurgitation NA. Aorta: The aortic root and ascending aorta are structurally normal, with no evidence of dilitation. Venous: The inferior vena cava was not well visualized. IAS/Shunts: No atrial level shunt detected by color flow Doppler.  LEFT VENTRICLE PLAX 2D LVIDd:         4.44 cm  Diastology LVIDs:         3.91 cm  LV e' lateral:   12.00 cm/s LV PW:         0.99 cm  LV E/e' lateral: 5.1 LV IVS:        0.77 cm  LV e' medial:    6.85 cm/s LVOT diam:     2.30 cm  LV E/e' medial:  8.9 LV SV:         23 ml LV SV Index:   12.82 LVOT Area:     4.15 cm  RIGHT VENTRICLE RV S prime:     5.59 cm/s TAPSE (M-mode): 1.1 cm LEFT ATRIUM             Index       RIGHT ATRIUM           Index LA diam:        3.50 cm 1.91 cm/m  RA Area:     17.90 cm LA Vol (A2C):   74.5 ml 40.60 ml/m RA Volume:   49.70 ml  27.08 ml/m LA Vol (A4C):   66.9 ml 36.46 ml/m LA Biplane Vol: 68.9 ml 37.55 ml/m  AORTIC VALVE LVOT Vmax:   61.30 cm/s LVOT Vmean:  40.600 cm/s LVOT VTI:    0.101 m  AORTA Ao Root diam: 3.30 cm MITRAL VALVE                         TRICUSPID VALVE MV Area (PHT): 4.80 cm              TR Peak grad:   7.3 mmHg MV PHT:        45.82 msec            TR Vmax:        141.00 cm/s MV Decel Time: 158 msec MV E velocity: 61.20 cm/s  103 cm/s  SHUNTS MV A velocity: 102.00 cm/s 70.3 cm/s Systemic VTI:  0.10 m MV E/A ratio:  0.60        1.5       Systemic Diam: 2.30 cm  Buford Dresser MD Electronically signed by Buford Dresser MD Signature Date/Time: 11/12/2019/6:55:20 PM    Final    IR PERCUTANEOUS ART THROMBECTOMY/INFUSION INTRACRANIAL INC DIAG ANGIO 11/20/2019 INDICATION: New onset left weakness, left-sided visual field deficit and left sided neglect. CT angiogram  of the head and neck revealed distal basilar artery occlusion, right posterior cerebral artery occlusion. EXAM: 1. EMERGENT LARGE VESSEL OCCLUSION THROMBOLYSIS (POSTERIOR CIRCULATION) COMPARISON:  CT angiogram of the head and neck of 09/19/2019. MEDICATIONS: Vancomycin 1 g IV antibiotic was administered within 1 hour of the procedure. ANESTHESIA/SEDATION: General anesthesia. CONTRAST:  Isovue 300 approximately 65 mL. FLUOROSCOPY TIME:  Fluoroscopy Time: 29 minutes 36 seconds (1285 mGy). COMPLICATIONS: None immediate. TECHNIQUE: Following a full explanation of the procedure along with the potential associated complications, an informed witnessed consent was obtained from patient's daughter. The risks of intracranial hemorrhage of 10%, worsening neurological deficit, ventilator dependency, death and inability to revascularize were all reviewed in detail with the patient's daughter. The patient was then put under general anesthesia by the Department of Anesthesiology at [facility]. The right groin was prepped and draped in the usual sterile fashion. Thereafter using modified Seldinger technique, transfemoral access into the right common femoral artery was obtained without difficulty. Over a 0.035 inch guidewire a 5 French Pinnacle sheath was inserted. Through this, and also over a 0.035 inch guidewire a 5 [place] JB 1 catheter was advanced to the aortic arch region and selectively positioned in the left subclavian artery and the left vertebral artery artery. FINDINGS: The left subclavian arteriogram demonstrates wide patency of the left vertebral artery at its origin. There is a loop-shaped configuration of the proximal left vertebral artery. More distally, the vessel is seen to opacify to the cranial skull base. Wide patency is seen of the left vertebrobasilar junction and the left posterior-inferior cerebellar artery. Tapered narrowing of approximately 50-60% is seen of proximal basilar artery at its  confluence with the vertebrobasilar artery. Distal to this the basilar artery demonstrates wide patency to just proximal to the origin of the superior cerebellar arteries. Distal to this there is complete angiographic occlusion of the distal basilar artery. Nonvisualization of the posterior cerebral arteries and partially the superior cerebellar arteries is seen. PROCEDURE: The diagnostic JB 1 catheter in the distal left subclavian artery was exchanged over a 0.035 inch 300 cm Rosen exchange guidewire for an 8 French 80 cm Neuron Max sheath using biplane roadmap technique and constant fluoroscopic guidance. Good aspiration obtained from the hub of the Neuron Max sheath following removal of the exchange guidewire. Good aspiration was obtained from the hub of the Neuron Max sheath. A gentle contrast injection demonstrated no evidence of spasms, dissections or of intraluminal filling defects. The Neruon Max sheath was gently retrieved more proximally to engage the origin of the left vertebral artery. Over a 0.035 inch Roadrunner guidewire, using biplane roadmap technique and constant fluoroscopic guidance, a 132 cm 6 [place] Catalyst guide catheter was then advanced without difficulty to the distal left vertebral artery followed by the advancement of the Neuron Max sheath into the proximal left vertebral artery. The guidewire was removed. Good aspiration obtained from the hub of the 6 [place] Catalyst guide catheter. Control arteriogram performed through this demonstrated no evidence of spasms, dissections or of intraluminal filling defects. A biplane roadmap was then obtained centered over the basilar artery. Over a 0.014 inch Synchro standard micro guidewire with a J configuration, a 150 cm Trevo ProVue microcatheter was advanced to the distal end of the Catalyst guide catheter. With the micro guidewire leading with a J-tip configuration, the combination was navigated without difficulty through 50-60% stenosis at the  junction of the left vertebrobasilar junction to the proximal basilar artery. The micro guidewire was then gently manipulated without much resistance into the left posterior cerebral artery P2 P3 region followed by the microcatheter. At this time, the Catalyst guide catheter was advanced to just  proximal to the occluded distal basilar artery. The guidewire was removed. Good aspiration obtained from the hub of the microcatheter. A gentle control arteriogram performed through the microcatheter demonstrated safe position of tip of the microcatheter, which was connected to continuous heparinized saline infusion. A 4 mm x 40 mm Solitaire X retrieval device was then advanced to the distal end of the microcatheter. The O ring on the delivery microcatheter was then loosened. With slight forward gentle traction with the right hand on the delivery micro guidewire with the left hand the delivery microcatheter was retrieved unsheathing the distal and then the proximal portion of the retrieval device. The 8 [place] Catalyst guide catheter was now advanced into the orifice of the left posterior cerebral artery where no aspiration of blood was seen into the Penumbra aspiration tubing. This combination was maintained for approximately 2 minutes and 10 seconds. Thereafter, the microcatheter was retrieved proximally. The retrieval device was then gently retrieved more proximally in combination with the 6 [place] Catalyst guide catheter where a complete occlusion to aspiration was maintained as the combination was removed with constant aspiration being applied at the hub of the Neuron Max sheath. The retrieval device, and the micro guidewire were retrieved and removed. There was free aspiration through the Catalyst guide catheter which was maintained in the left vertebrobasilar junction. Specks of clot were seen in the Penumbra collection device. A control arteriogram performed through the 6 [place] Catalyst guide catheter in the left  vertebral artery demonstrated complete angiographic revascularization of the distal basilar artery, the posterior cerebral arteries and the superior cerebellar arteries which were duplicated in anatomic variation. A TICI 3 revascularization was achieved. A control arteriogram performed following removal of the Catalyst guide catheter from the Neuron Max sheath demonstrated patency of the left vertebral artery extra cranially and intracranially. Also noted was stable 50-60% stenosis at the junction of the left vertebrobasilar junction with the proximal basilar artery. Areas of caliber irregularity were seen in the posterior cerebral arteries bilaterally worse on the left side consistent with intracranial arteriosclerosis. However, a TICI 3 revascularization was maintained. Throughout the procedure, the patient's blood pressure and neurological status remained stable. No evidence of mass effect or extravasation was seen. The Neuron Max sheath was then removed. The 8 French Pinnacle sheath was then removed from the right groin puncture site. Hemostasis was achieved with manual compression over about 20-30 minutes. A CT scan of brain obtained demonstrated no evidence of mass effect, midline shift or intracranial hemorrhage. The patient's general anesthesia was then reversed with patient extubated without much difficulty. Upon recovery, the patient was able to obey simple commands and move all fours spontaneously and to command. He was then transferred to the neuro ICU for post thrombectomy management. IMPRESSION: Status post endovascular complete revascularization of the distal basilar artery, the posterior cerebral arteries, the superior cerebellar arteries with 1 pass with the 4 mm x 40 mm Solitaire X retrieval device and Penumbra aspiration achieving a TICI 3 revascularization. PLAN: Follow-up in the clinic 4 weeks post discharge. Electronically Signed   By: Luanne Bras M.D.   On: 11/19/2019 16:05   CT HEAD  CODE STROKE WO CONTRAST 11/19/2019 CLINICAL DATA:  Code stroke.  Ataxia with stroke suspected EXAM: CT HEAD WITHOUT CONTRAST TECHNIQUE: Contiguous axial images were obtained from the base of the skull through the vertex without intravenous contrast. COMPARISON:  Brain MRI 07/11/2013 FINDINGS: Brain: No evidence of acute infarction, hemorrhage, hydrocephalus, extra-axial collection or mass lesion/mass effect. Generalized atrophy.  Chronic ischemic injury about the left basal ganglia in the lateral lenticulostriate distribution. Small vessel ischemic gliosis in the white matter is mild. Cerebral volume loss in keeping with age. Vascular: Atherosclerotic calcification that is prominent. No hyperdense vessel. Skull: Normal. Negative for fracture or focal lesion. Sinuses/Orbits: Bilateral cataract resection. Other: These results were communicated to Dr. Leonel Ramsay at 4:54 amon 12/14/2020by text page via the [facility] messaging system. ASPECTS [facility] Stroke Program Early CT Score) Not scored with this history IMPRESSION: Aging brain without acute or reversible finding. Electronically Signed   By: Monte Fantasia M.D.   On: 11/19/2019 04:57   CT Angio Abd/Pel w/ and/or w/o 11/13/2019 CLINICAL DATA:  Bright red stools yesterday, tarry stools overnight EXAM: CTA ABDOMEN AND PELVIS WITHOUT AND WITH CONTRAST TECHNIQUE: Multidetector CT imaging of the abdomen and pelvis was performed using the standard protocol during bolus administration of intravenous contrast. Multiplanar reconstructed images and MIPs were obtained and reviewed to evaluate the vascular anatomy. CONTRAST:  143mL OMNIPAQUE IOHEXOL 350 MG/ML SOLN COMPARISON:  11/02/2019 FINDINGS: VASCULAR Aorta: Moderate calcified atheromatous plaque. No aneurysm, dissection, or stenosis. Celiac: Calcified ostial plaque without high-grade stenosis. Patent without evidence of aneurysm, dissection, vasculitis or significant stenosis. SMA: Calcified ostial plaque without  high-grade stenosis. Patent distally with classic distal branch anatomy. Renals: Single left, with calcified plaque extending from the origin 1.6 cm resulting in at least moderate stenosis. Duplicated right, inferior dominant with calcified ostial plaque resulting in short segment high-grade stenosis. IMA: Atheromatous, patent. Inflow: Eccentric calcified plaque in the proximal left common iliac artery resulting in stenosis of probable hemodynamic significance, patent distally. Scattered nonocclusive plaque through the right iliac arterial system. Proximal Outflow: Atheromatous, patent. Veins: Patent hepatic veins, portal vein, SMV, splenic vein, bilateral renal veins, IVC. No venous pathology identified. Review of the MIP images confirms the above findings. NON-VASCULAR Lower chest: Large hiatal hernia involving most of the stomach. Tiny bilateral pleural effusions. Calcified subpleural granuloma in right middle lobe (Im3,Se9) . coronary calcifications. Previous median sternotomy. Hepatobiliary: Stable cyst in hepatic segment 6. No new liver lesion. Gallbladder incompletely distended. No biliary ductal dilatation. Pancreas: Mild atrophy. Scattered coarse parenchymal calcifications suggesting chronic pancreatitis. No regional inflammatory changes or fluid collections. Spleen: Normal in size without focal abnormality. Small accessory splenule. Adrenals/Urinary Tract: Normal adrenal glands. Bilateral nephrolithiasis, largest stone 8 mm in the lower pole right renal collecting system. No hydronephrosis. Symmetric renal parenchymal enhancement. Stomach/Bowel: Stomach is nondilated, largely within hiatal hernia. Hyperdense material in the gastric fundus. Small bowel is nondistended. A knuckle of small bowel protrudes into a periumbilical hernia, without obstruction or strangulation. Appendix not identified. The colon is nondilated., with multiple descending diverticula. No adjacent inflammatory/edematous change or  abscess. No active extravasation. Lymphatic: No abdominal or pelvic adenopathy. Reproductive: Prostate is unremarkable. Other: No ascites. No free air. Musculoskeletal: Small periumbilical hernia containing a knuckle of small bowel, without obstruction or strangulation. Mild spondylitic changes in the lower lumbar spine. No fracture or worrisome bone lesion. IMPRESSION: 1. No active GI bleed identified. 2. Large hiatal hernia involving most of the stomach, incompletely included on the scan. 3. Small periumbilical hernia involving small bowel, without obstruction or strangulation. 4. Descending colon diverticulosis. 5. Right inferior renal artery ostial stenosis of probable hemodynamic significance. 6. Proximal left common iliac artery calcified stenosis; correlate with clinical symptomatology and ABIs. 7. Bilateral nephrolithiasis without hydronephrosis. 8. Tiny bilateral pleural effusions. 9. Coronary and Aortic Atherosclerosis (ICD10-170.0). Electronically Signed   By: Eden Emms.D.  On: 11/13/2019 12:28      HISTORY OF PRESENT ILLNESS RICKO MACDONELL is a 85 y.o. male who was recently admitted for STEMI with stenting of the venous graft supplying the PDA.  He had just recently been discharged and then went to bed last night normally. His wife called his daughter and stated that he was having trouble getting out of bed and needed to go to the bathroom because he could not move his left side.  His daughter then went over and in the interim the patient's wife had tried to get the patient out of bed and he had fallen.  She noticed that he could not move his left side and called 911.   Prior to his MI, he is able to do everything that he typically would want to do.  He lives independently and took care of his wife with dementia.  Since the heart attack, his daughter had been helping him out some, but he was progressing rapidly with physical therapy.    He was LKW at 8 PM on 11/19/2019. He was not a tpa  candidate as he was outside of the window . Premorbid modified rankin scale: 1 NIHSS: [place] Mr. JAHKING SOHAL is a 85 y.o. male with history of recent STEMI s/p stenting who was d/c home 12/11, developing L sided weakness. Found to have a BA occlusion and sent to IR.     Stroke: small scattered B cerebellar, right PCA, left MCA and MCA/PCA, right thalamus in setting of BA and right PCA occlusion s/p IR with TICI3 reperfusion and intra/extracranial stenosis, likely secondary to large vessel disease with hypotension and CHF, vs. cardioembolic source with low EF and possible occult afib Code Stroke CT head No acute abnormality. CTA head & neck distal BA occlusion w/ thready flow R PCA w/ 18cc penumbra. Severe R supraclinoid ICA stenosis w/ R MCA branch underfilling (chronic). High-grade narrowing L VBJ. Heavily calcified ICA bifurcations, L 75%. CT perfusion R PCA w/ 18cc penumbra Cerebral angio TICI 3 revascularization of BA. 50-60% stenosis proximal BA. Post IR CT NO ICH or mass effect.  MRI  Several small foci acute infarct as above. Small vessel disease. MRA  Improved flow intracranial R VA, distal BA, R PCA 2D Echo  EF 20-25% with no SOE, unchanged since 11/12/19 LDL 36 HgbA1c 6.9 SCDs for VTE prophylaxis Brilinta (ticagrelor) 90 mg bid prior to admission, now on plavix 75 following load. Off ASA due to hx of severe GIB. Pt not good anticoagulation at this time due to hx of severe GIB, anemia and need of plavix for cardiac stent. Off Brilinta d/t SOB following administration. Therapy recommendations:  HH PT & OT ->SNF at request of daughter  Disposition:  pending (caregiver for wife who has dementia) Plan for DC in am  SARS Coronavirus 2 by RT PCR 11/23/19 - negative   Recent STEMI s/p stenting 11/12/19 admitted by cardiology Cardiac cath - severe multivessle CAD S/p stenting RCA On plavix only due to hx of GIB (changed from Brilinta d/t SOB, no ASA) Hx of CABG  1993 EKG unchanged Trop 903 - much improved Cardiology consult appreciated   Severe intracranial atherosclerosis CTA head and neck showed Severe R supraclinoid ICA stenosis w/ R MCA branch underfilling (chronic). Severe left ICA siphon high grade stenosis. High-grade narrowing L VBJ. Heavily calcified ICA bifurcations bilaterally L>R, with L 75%. On DAPT Avoid low BP Long-term BP goal 120-150 given severe intracranial stenosis  Cardiomyopathy  Chronic Systolic HF EF [id number] Current TTE EF 20-25% Cardiology consult appreciate Brilinta changed to plavix as SOB following Brilinta administration Hold lasix  Resume imdur, metoprolol, and low dose losartran CXR no acute disease 11/20/19 BNP 2,174     Hx of GIB with anemia Recent diverticulitis and large HH w/ hx colectomy 2-3 yrs ago, treated with cipro and flagyl. d/c 11/04/2019 Hx remote GIB on ASA Hb 8.5 on 11/14/19 No ASA as per cardiology post stenting due to hx of GIB Hb 11.2->7.7->8.9->8.8->8.3->9.2  Guaiac stool negative  Transfuse if Hb < 7.0   Hx if hypertension Hypotension  Home meds:  imdur 60, losartan 12.5, metoprolol 25 Little on the low side - 100-120s Resume home meds - imdur, losartan and metoprolol Decrease metoprolol to 12.5 due to BP at lower end Hold off lasix Long-term BP goal 120-150 given severe intracranial stenosis   Hyperlipidemia Home meds:  crestor 20, resumed in hospital LDL 50, goal < 70 Continue statin at discharge   Diabetes type II Controlled Home meds:  Metformin 500 acb HgbA1c 6.8, goal < 7.0 CBGs SSI Glucose stable without metformin   Other Stroke Risk Factors Advanced age Former Cigarette smoker ETOH use, alcohol level <10, advised to drink no more than 2 drink(s) a day   Other Active Problems Hypothyroid on synthroid C-collar cleared after CT C-spine and pain free neck movement  Leukocytosis 8.3->11.4->7.5 - continue monitoring  Thrombocytosis PLT  518->569->508->468 Anxiety, xanax prn Foul smelling stool x 4 yesterday, cdiff negative, isolation d/c'd Hypokalemia - treated and resolved   DISCHARGE EXAM Blood pressure (!) 91/57, pulse 73, temperature 98.2 F (36.8 C), temperature source Oral, resp. rate 20, height 5\' 10"  (1.778 m), weight 68 kg, SpO2 98 %.   General - Well nourished, well developed, not in acute distress.   Ophthalmologic - fundi not visualized due to noncooperation.   Cardiovascular - irregular heart rate and rhythm with frequent PCAs and PVCs.   Mental Status -  Level of arousal and orientation to time, place, and person were intact. Language including expression, naming, repetition, comprehension was assessed and found intact.   Cranial Nerves II - XII - II - Visual field intact OU. III, IV, VI - Extraocular movements intact. V - Facial sensation intact bilaterally. VII - Facial movement intact bilaterally. VIII - Hearing & vestibular intact bilaterally. X - Palate elevates symmetrically. XI - Chin turning & shoulder shrug intact bilaterally. XII - Tongue protrusion intact.   Motor Strength - The patient's strength was symmetrical in all extremities and pronator drift was absent.  Bulk was normal and fasciculations were absent.   Motor Tone - Muscle tone was assessed at the neck and appendages and was normal.   Reflexes - The patient's reflexes were symmetrical in all extremities and he had no pathological reflexes.   Sensory - Light touch, temperature/pinprick were assessed and were symmetrical.     Coordination - The patient had normal movements in the hands with no ataxia or dysmetria.  Tremor was absent.   Gait and Station - deferred.    Discharge Diet   Dysphagia 3 thin liquids  DISCHARGE PLAN Disposition:  Skilled nursing facility for ongoing PT, OT and ST.  clopidogrel 75 mg daily for secondary stroke prevention. Ongoing stroke risk factor control by Primary Care Physician at time of  discharge Follow-up PCP Lavone Orn, MD in 2 weeks. Follow-up in [facility] in 4 weeks, office to schedule an appointment.  Follow-up Cardiology - Isaiah Serge, NP Follow up on 11/28/2019.   Specialties: Cardiology, Radiology  Why: at 11:30am for your follow up appt.   Contact information:  [address]  [address]  [place] [address]  [phone]  PLEASE CALL TO CONFIRM APPOINTMENT WITH CARDIOLOGY  60 minutes were spent preparing discharge.  Mikey Bussing PA-C [facility] Pager [phone] 11/24/2019, 7:31 AM I have personally obtained history,examined this patient, reviewed notes, independently viewed imaging studies, participated in medical decision making and plan of care.ROS completed by me personally and pertinent positives fully documented  I have made any additions or clarifications directly to the above note. Agree with note above.    Antony Contras, MD Medical Director [facility] Pager: [phone] 11/24/2019 2:33 PM

## 2019-11-24 LAB — CBC
HCT: 30.2 % — ABNORMAL LOW (ref 39.0–52.0)
Hemoglobin: 9.2 g/dL — ABNORMAL LOW (ref 13.0–17.0)
MCH: 30.8 pg (ref 26.0–34.0)
MCHC: 30.5 g/dL (ref 30.0–36.0)
MCV: 101 fL — ABNORMAL HIGH (ref 80.0–100.0)
Platelets: 451 10*3/uL — ABNORMAL HIGH (ref 150–400)
RBC: 2.99 MIL/uL — ABNORMAL LOW (ref 4.22–5.81)
RDW: 14 % (ref 11.5–15.5)
WBC: 8.1 10*3/uL (ref 4.0–10.5)
nRBC: 0 % (ref 0.0–0.2)

## 2019-11-24 LAB — BASIC METABOLIC PANEL
Anion gap: 9 (ref 5–15)
BUN: 14 mg/dL (ref 8–23)
CO2: 20 mmol/L — ABNORMAL LOW (ref 22–32)
Calcium: 8.2 mg/dL — ABNORMAL LOW (ref 8.9–10.3)
Chloride: 110 mmol/L (ref 98–111)
Creatinine, Ser: 0.75 mg/dL (ref 0.61–1.24)
GFR calc Af Amer: >60 mL/min (ref >60)
GFR calc non Af Amer: >60 mL/min (ref >60)
Glucose, Bld: 126 mg/dL — ABNORMAL HIGH (ref 70–99)
Potassium: 3.9 mmol/L (ref 3.5–5.1)
Sodium: 139 mmol/L (ref 135–145)

## 2019-11-24 LAB — GLUCOSE, CAPILLARY: Glucose-Capillary: 105 mg/dL — ABNORMAL HIGH (ref 70–99)

## 2019-11-24 MED ORDER — ALPRAZOLAM 0.5 MG PO TABS: 0.5000 mg | ORAL_TABLET | Freq: Three times a day (TID) | ORAL | 1 refills | Status: DC | PRN

## 2019-11-24 MED ORDER — LEVOTHYROXINE SODIUM 50 MCG PO TABS: 50.0000 ug | ORAL_TABLET | Freq: Every day | ORAL | 1 refills | Status: DC

## 2019-11-24 MED ORDER — PANTOPRAZOLE SODIUM 40 MG PO TBEC: 40.0000 mg | DELAYED_RELEASE_TABLET | Freq: Two times a day (BID) | ORAL | 1 refills | Status: DC

## 2019-11-24 MED ORDER — CLOPIDOGREL BISULFATE 75 MG PO TABS: 75.0000 mg | ORAL_TABLET | Freq: Every day | ORAL | 1 refills | Status: DC

## 2019-11-24 MED ORDER — METOPROLOL SUCCINATE ER 25 MG PO TB24: 12.5000 mg | ORAL_TABLET | Freq: Every day | ORAL | 1 refills | Status: DC

## 2019-11-24 NOTE — TOC Transition Note (Signed)
Transition of Care [facility]) - CM/SW Discharge Note   Patient Details  Name: Kenneth [name] MRN: [id number] Date of Birth: February 09, 1928  Transition of Care [facility]) CM/SW Contact:  Gelene Mink, [place] Phone Number: 11/24/2019, 9:06 AM   Clinical Narrative:     Patient will DC to: [facility] Anticipated DC date: 11/24/2019 Family notified: Yes Transport by: Corey Harold   Per MD patient ready for DC to . RN, patient, patient's family, and facility notified of DC. Discharge Summary and FL2 sent to facility. RN to call report prior to discharge [phone], Room 304). DC packet on chart. Ambulance transport requested for patient.   CSW will sign off for now as social work intervention is no [name] needed. Please consult [place] again if new needs arise.  Niels Cranshaw, LCSW-A [place]/Clinical Social Work Department Cell: [phone]     Final next level of care: [place] Barriers to Discharge: No Barriers Identified   Patient Goals and CMS Choice Patient states their goals for this hospitalization and ongoing recovery are:: Patient will be discharging to SNF CMS Medicare.gov Compare Post Acute Care list provided to:: Patient Represenative (must comment) Choice offered to / list presented to : Adult Children  Discharge Placement   Existing PASRR number confirmed : 11/22/19          Patient chooses bed at: [facility] Patient to be transferred to facility by: [facility] Name of family member notified: Daleen Snook Patient and family notified of of transfer: 11/24/19  Discharge Plan and Services In-house Referral: Clinical Social Work Discharge Planning Services: [employer] Consult Post Acute Care Choice: [place]                               Social Determinants of Health (SDOH) Interventions     Readmission Risk Interventions No flowsheet data found.

## 2019-11-26 ENCOUNTER — Telehealth (HOSPITAL_COMMUNITY): Payer: Self-pay

## 2019-11-26 NOTE — Telephone Encounter (Signed)
Called to schedule 4 f/u, no answer, no vm. AW

## 2019-11-27 NOTE — Progress Notes (Deleted)
Cardiology Office Note   Date:  11/27/2019   ID:  Kenneth [name] [birth year], MRN [id number]  PCP:  Lavone Orn, MD  Cardiologist:  Dr. Tamala Julian     No chief complaint on file.     History of Present Illness: Kenneth [name] is a 85 y.o. male who presents for post hospitalization.   hx of CAD status post remote CABG with recent stenting of SVG to PDA (12/08), hypothyroidism, hypertension, hyperlipidemia, diabetes and GI bleed   admitted 11/27-11/29/20with diverticulitis and large hiatal hernia.Kenneth [name] He initially presented with left lower quadrant pain, anorexia and vomiting. Prior history of colectomy 2 to 3 years ago. He was treated conservatively with antibiotic. Plan for surgical consultation versus GI referral as outpatient if reoccurrence. He was doing relatively well but not able to tolerate his antibiotic (metronidazole and ofloxacin). He hasintermittent nausea and abdominal pain. Per daughter he stopped his antibiotic 3 days ago 12/4.  11/12/19  patient had severe fatigue, nausea and intermittent shortness of breath. His symptoms worsened today and EMS was called and found to have inferior STEMI. He was brought directly to Cath Lab. Underwent stenting to his RCA. There was a delay in the PCI as Dr. Claiborne Billings needed to discuss goal of care with family and high decision making with his regular cardiologist Dr. Tamala Julian. EF 20-25%.  On toprol XL and low dose losartan and high dose statin.  D/c'd 11/16/19    On 11/19/19 Admitted for right brain stroke small scattered bilateral cerebellar, right PCA, left MCA and MCA/PCA, right thalamus in setting of BA and right PCA occlusions. treated with extraction thrombectomy with dramatic improvement in left hemiparesis.  Noted to be dyspneic concern was for CHF or STEMI.  After evaluation neither was felt to be present.  Hyperpnea felt likely related to [place]. Changed to plavix.  No asa due to recent GI bleed.   hgb at discharge  9.2, discharged 11/24/19   Today *** Past Medical History:  Diagnosis Date  . Anemia    on iron  . Anxiety   . Arthritis   . Basal cell carcinoma    left ear  . CAD (coronary artery disease)    a. CAD s/p CABG 1993.  Kenneth [name] Chronic cough   . Chronic rhinitis   . Complication of anesthesia   . Diabetes ([place])   . Diverticulosis of colon   . GERD (gastroesophageal reflux disease)   . Glaucoma   . H/O hiatal hernia   . Hernia    left side  . History of gout   . History of kidney stones   . HTN (hypertension)   . Hyperlipidemia `  . Melanoma of skin, site unspecified   . PONV (postoperative nausea and vomiting)   . [facility] spotted fever 2-3 yrs ago  . Thyroid disease     Past Surgical History:  Procedure Laterality Date  . COLONOSCOPY WITH PROPOFOL N/A 11/27/2013   Procedure: COLONOSCOPY WITH PROPOFOL;  Surgeon: Garlan Fair, MD;  Location: [facility];  Service: Endoscopy;  Laterality: N/A;  . COLOSTOMY TAKEDOWN N/A 03/16/2016   Procedure: COLOSTOMY TAKEDOWN;  Surgeon: Excell Seltzer, MD;  Location: [facility];  Service: General;  Laterality: N/A;  . CORONARY ARTERY BYPASS GRAFT  1990's   x 3  . CORONARY/GRAFT ACUTE MI REVASCULARIZATION N/A 11/12/2019   Procedure: CORONARY/GRAFT ACUTE MI REVASCULARIZATION;  Surgeon: Troy Sine, MD;  Location: [facility];  Service: Cardiovascular;  Laterality: N/A;  . HERNIA  REPAIR  1970's,15   lft ing hernia  . INGUINAL HERNIA REPAIR Left 06/28/2014   Procedure: Left  INGUINAL HERNIA REPAIR ;  Surgeon: Merrie Roof, MD;  Location: [facility];  Service: General;  Laterality: Left;  . INSERTION OF MESH Left 06/28/2014   Procedure: INSERTION OF MESH;  Surgeon: Merrie Roof, MD;  Location: [facility];  Service: General;  Laterality: Left;  . INSERTION OF MESH N/A 10/11/2014  . IR CT HEAD LTD  11/19/2019  . IR PERCUTANEOUS ART THROMBECTOMY/INFUSION INTRACRANIAL INC DIAG ANGIO  11/19/2019  . LAPAROSCOPY  10/11/2014  . LAPAROTOMY  N/A 09/11/2015   Procedure: EXPLORATORY LAPAROTOMY;  Surgeon: Excell Seltzer, MD;  Location: [facility];  Service: General;  Laterality: N/A;  . MASS EXCISION Left 03/21/2017   Procedure: EXCISION BASAL CELL CARCINOMA LEFT EAR;  Surgeon: Leta Baptist, MD;  Location: [facility];  Service: ENT;  Laterality: Left;  . PARTIAL COLECTOMY N/A 09/11/2015   Procedure: SIGMOID COLECTOMY AND COLOSTOMY;  Surgeon: Excell Seltzer, MD;  Location: [facility];  Service: General;  Laterality: N/A;  . RADIOLOGY WITH ANESTHESIA N/A 11/19/2019   Procedure: RADIOLOGY WITH ANESTHESIA;  Surgeon: Luanne Bras, MD;  Location: [facility];  Service: Radiology;  Laterality: N/A;  . right leg fracture age 62    . SKIN FULL THICKNESS GRAFT Left 03/21/2017   Procedure: FULL THICKNESS SKIN GRAFT TO LEFT EAR;  Surgeon: Leta Baptist, MD;  Location: [facility];  Service: ENT;  Laterality: Left;  . TONSILLECTOMY  age 18 or 48  . VENTRAL HERNIA REPAIR  10/11/2014   Procedure: HERNIA REPAIR VENTRAL ADULT;  Surgeon: Autumn Messing III, MD;  Location: [facility];  Service: General;;     Current Outpatient Medications  Medication Sig Dispense Refill  . acetaminophen (TYLENOL) 325 MG tablet Take 325 mg by mouth every 6 (six) hours as needed for mild pain.     Kenneth [name] ALPRAZolam (XANAX) 0.5 MG tablet Take 1 tablet (0.5 mg total) by mouth 3 (three) times daily as needed for anxiety. 90 tablet 1  . Artificial Tear Solution (SOOTHE XP OP) Apply 1 drop to eye as needed (dry eyes).     . clopidogrel (PLAVIX) 75 MG tablet Take 1 tablet (75 mg total) by mouth daily. 30 tablet 1  . Cyanocobalamin (VITAMIN B-12 PO) Take 1 tablet by mouth daily.    . ferrous sulfate 325 (65 FE) MG tablet Take 325 mg by mouth daily with breakfast.    . isosorbide mononitrate (IMDUR) 60 MG 24 hr tablet Take 60 mg by mouth every evening.     . latanoprost (XALATAN) 0.005 % ophthalmic solution Place 1 drop into both eyes at bedtime.    Kenneth [name] levothyroxine (SYNTHROID) 50  MCG tablet Take 1 tablet (50 mcg total) by mouth daily at 6 (six) AM. 30 tablet 1  . losartan (COZAAR) 25 MG tablet Take 0.5 tablets (12.5 mg total) by mouth daily. 30 tablet 1  . metoprolol succinate (TOPROL-XL) 25 MG 24 hr tablet Take 0.5 tablets (12.5 mg total) by mouth daily. 16 tablet 1  . Multiple Vitamin (MULTIVITAMIN WITH MINERALS) TABS tablet Take 1 tablet by mouth daily.    . nitroGLYCERIN (NITROSTAT) 0.4 MG SL tablet Place 1 tablet (0.4 mg total) under the tongue every 5 (five) minutes as needed. 25 tablet 2  . ondansetron (ZOFRAN) 4 MG tablet Take 1 tablet (4 mg total) by mouth every 6 (six) hours as needed for nausea. [place]  tablet 0  . pantoprazole (PROTONIX) 40 MG tablet Take 1 tablet (40 mg total) by mouth 2 (two) times daily. 60 tablet 1  . potassium chloride (K-DUR,KLOR-CON) 10 MEQ tablet Take 10 mEq by mouth every Monday, Wednesday, and Friday.     . rosuvastatin (CRESTOR) 20 MG tablet Take 1 tablet (20 mg total) by mouth daily at 6 PM. 90 tablet 1   No current facility-administered medications for this visit.    Allergies:   Lasix [furosemide], Spironolactone, Aspirin, Lipitor [atorvastatin], Lopressor [metoprolol tartrate], and Penicillins    Social History:  The patient  reports that he has quit smoking. His smoking use included cigars. He has never used smokeless tobacco. He reports current alcohol use. He reports that he does not use drugs.   Family History:  The patient's ***family history includes CAD in his father; Cancer in his mother; Hyperlipidemia in his father.    ROS:  General:no colds or fevers, no weight changes Skin:no rashes or ulcers HEENT:no blurred vision, no congestion CV:see HPI PUL:see HPI GI:no diarrhea constipation or melena, no indigestion GU:no hematuria, no dysuria MS:no joint pain, no claudication Neuro:no syncope, no lightheadedness Endo:no diabetes, no thyroid disease Wt Readings from Last 3 Encounters:  11/19/19 149 lb 14.6 oz (68 kg)    11/16/19 148 lb 1.9 oz (67.2 kg)  11/02/19 147 lb 11.3 oz (67 kg)     PHYSICAL EXAM: VS:  There were no vitals taken for this visit. , BMI There is no height or weight on file to calculate BMI. General:Pleasant affect, NAD Skin:Warm and dry, brisk capillary refill HEENT:normocephalic, sclera clear, mucus membranes moist Neck:supple, no JVD, no bruits  Heart:S1S2 RRR without murmur, [place], rub or click Lungs:clear without rales, rhonchi, or wheezes [id number], non tender, + BS, do not palpate liver spleen or masses Ext:no lower ext edema, 2+ pedal pulses, 2+ radial pulses Neuro:alert and oriented, MAE, follows commands, + facial symmetry    EKG:  EKG is ordered today. The ekg ordered today demonstrates ***   Recent Labs: 11/02/2019: TSH 2.833 11/19/2019: ALT 14 11/21/2019: B Natriuretic Peptide 2,174.4 11/24/2019: BUN 14; Creatinine, Ser 0.75; Hemoglobin 9.2; Platelets 451; Potassium 3.9; Sodium 139    Lipid Panel    Component Value Date/Time   CHOL 81 11/20/2019 0500   TRIG 67 11/20/2019 0500   HDL 32 (L) 11/20/2019 0500   CHOLHDL 2.5 11/20/2019 0500   VLDL 13 11/20/2019 0500   LDLCALC 36 11/20/2019 0500       Other studies Reviewed: Additional studies/ records that were reviewed today include: ***.   ASSESSMENT AND PLAN:  1.  ***   Current medicines are reviewed with the patient today.  The patient Has no concerns regarding medicines.  The following changes have been made:  See above Labs/ tests ordered today include:see above  Disposition:   FU:  see above  Signed, Cecilie Kicks, NP  11/27/2019 10:15 PM    [address], [place], [place], [place] Phone: [phone]; Fax: [phone]

## 2019-11-28 ENCOUNTER — Inpatient Hospital Stay (HOSPITAL_COMMUNITY)
Admission: EM | Admit: 2019-11-28 | Discharge: 2019-12-05 | DRG: 175 | Disposition: A | Discharge status: Hospice | Payer: Medicare Other | Source: Other Acute Inpatient Hospital | Attending: Internal Medicine | Admitting: Internal Medicine

## 2019-11-28 ENCOUNTER — Emergency Department (HOSPITAL_COMMUNITY): Payer: Medicare Other

## 2019-11-28 ENCOUNTER — Other Ambulatory Visit: Payer: Self-pay

## 2019-11-28 ENCOUNTER — Ambulatory Visit: Payer: Medicare Other | Admitting: Cardiology

## 2019-11-28 ENCOUNTER — Encounter (HOSPITAL_COMMUNITY): Payer: Self-pay

## 2019-11-28 DIAGNOSIS — K219 Gastro-esophageal reflux disease without esophagitis: Secondary | ICD-10-CM | POA: Diagnosis present

## 2019-11-28 DIAGNOSIS — I2510 Atherosclerotic heart disease of native coronary artery without angina pectoris: Secondary | ICD-10-CM | POA: Diagnosis present

## 2019-11-28 DIAGNOSIS — E860 Dehydration: Secondary | ICD-10-CM | POA: Diagnosis not present

## 2019-11-28 DIAGNOSIS — I130 Hypertensive heart and chronic kidney disease with heart failure and stage 1 through stage 4 chronic kidney disease, or unspecified chronic kidney disease: Secondary | ICD-10-CM | POA: Diagnosis present

## 2019-11-28 DIAGNOSIS — N182 Chronic kidney disease, stage 2 (mild): Secondary | ICD-10-CM | POA: Diagnosis present

## 2019-11-28 DIAGNOSIS — Z7902 Long term (current) use of antithrombotics/antiplatelets: Secondary | ICD-10-CM

## 2019-11-28 DIAGNOSIS — N179 Acute kidney failure, unspecified: Secondary | ICD-10-CM | POA: Diagnosis present

## 2019-11-28 DIAGNOSIS — Z79899 Other long term (current) drug therapy: Secondary | ICD-10-CM

## 2019-11-28 DIAGNOSIS — I480 Paroxysmal atrial fibrillation: Secondary | ICD-10-CM | POA: Diagnosis present

## 2019-11-28 DIAGNOSIS — I10 Essential (primary) hypertension: Secondary | ICD-10-CM | POA: Diagnosis present

## 2019-11-28 DIAGNOSIS — I255 Ischemic cardiomyopathy: Secondary | ICD-10-CM | POA: Diagnosis present

## 2019-11-28 DIAGNOSIS — I252 Old myocardial infarction: Secondary | ICD-10-CM

## 2019-11-28 DIAGNOSIS — Z8249 Family history of ischemic heart disease and other diseases of the circulatory system: Secondary | ICD-10-CM

## 2019-11-28 DIAGNOSIS — I2119 ST elevation (STEMI) myocardial infarction involving other coronary artery of inferior wall: Secondary | ICD-10-CM | POA: Diagnosis present

## 2019-11-28 DIAGNOSIS — R638 Other symptoms and signs concerning food and fluid intake: Secondary | ICD-10-CM

## 2019-11-28 DIAGNOSIS — J189 Pneumonia, unspecified organism: Secondary | ICD-10-CM

## 2019-11-28 DIAGNOSIS — F419 Anxiety disorder, unspecified: Secondary | ICD-10-CM | POA: Diagnosis present

## 2019-11-28 DIAGNOSIS — I2699 Other pulmonary embolism without acute cor pulmonale: Secondary | ICD-10-CM | POA: Diagnosis not present

## 2019-11-28 DIAGNOSIS — R627 Adult failure to thrive: Secondary | ICD-10-CM | POA: Diagnosis present

## 2019-11-28 DIAGNOSIS — Z6821 Body mass index (BMI) 21.0-21.9, adult: Secondary | ICD-10-CM

## 2019-11-28 DIAGNOSIS — D649 Anemia, unspecified: Secondary | ICD-10-CM | POA: Diagnosis present

## 2019-11-28 DIAGNOSIS — Z85820 Personal history of malignant melanoma of skin: Secondary | ICD-10-CM

## 2019-11-28 DIAGNOSIS — Z515 Encounter for palliative care: Secondary | ICD-10-CM | POA: Diagnosis not present

## 2019-11-28 DIAGNOSIS — Z66 Do not resuscitate: Secondary | ICD-10-CM | POA: Diagnosis present

## 2019-11-28 DIAGNOSIS — Z20828 Contact with and (suspected) exposure to other viral communicable diseases: Secondary | ICD-10-CM | POA: Diagnosis present

## 2019-11-28 DIAGNOSIS — Z955 Presence of coronary angioplasty implant and graft: Secondary | ICD-10-CM

## 2019-11-28 DIAGNOSIS — H9190 Unspecified hearing loss, unspecified ear: Secondary | ICD-10-CM | POA: Diagnosis present

## 2019-11-28 DIAGNOSIS — E876 Hypokalemia: Secondary | ICD-10-CM | POA: Diagnosis not present

## 2019-11-28 DIAGNOSIS — Z87891 Personal history of nicotine dependence: Secondary | ICD-10-CM

## 2019-11-28 DIAGNOSIS — I5022 Chronic systolic (congestive) heart failure: Secondary | ICD-10-CM | POA: Diagnosis present

## 2019-11-28 DIAGNOSIS — R9431 Abnormal electrocardiogram [ECG] [EKG]: Secondary | ICD-10-CM | POA: Diagnosis present

## 2019-11-28 DIAGNOSIS — E785 Hyperlipidemia, unspecified: Secondary | ICD-10-CM | POA: Diagnosis present

## 2019-11-28 DIAGNOSIS — I959 Hypotension, unspecified: Secondary | ICD-10-CM | POA: Diagnosis present

## 2019-11-28 DIAGNOSIS — K567 Ileus, unspecified: Secondary | ICD-10-CM

## 2019-11-28 DIAGNOSIS — E870 Hyperosmolality and hypernatremia: Secondary | ICD-10-CM | POA: Diagnosis present

## 2019-11-28 DIAGNOSIS — Z8673 Personal history of transient ischemic attack (TIA), and cerebral infarction without residual deficits: Secondary | ICD-10-CM

## 2019-11-28 DIAGNOSIS — I5042 Chronic combined systolic (congestive) and diastolic (congestive) heart failure: Secondary | ICD-10-CM | POA: Diagnosis present

## 2019-11-28 DIAGNOSIS — E1122 Type 2 diabetes mellitus with diabetic chronic kidney disease: Secondary | ICD-10-CM | POA: Diagnosis present

## 2019-11-28 DIAGNOSIS — I25810 Atherosclerosis of coronary artery bypass graft(s) without angina pectoris: Secondary | ICD-10-CM | POA: Diagnosis present

## 2019-11-28 DIAGNOSIS — Z79890 Hormone replacement therapy (postmenopausal): Secondary | ICD-10-CM

## 2019-11-28 DIAGNOSIS — R531 Weakness: Secondary | ICD-10-CM

## 2019-11-28 DIAGNOSIS — N189 Chronic kidney disease, unspecified: Secondary | ICD-10-CM | POA: Diagnosis present

## 2019-11-28 DIAGNOSIS — K529 Noninfective gastroenteritis and colitis, unspecified: Secondary | ICD-10-CM | POA: Diagnosis present

## 2019-11-28 DIAGNOSIS — E039 Hypothyroidism, unspecified: Secondary | ICD-10-CM | POA: Diagnosis present

## 2019-11-28 DIAGNOSIS — E43 Unspecified severe protein-calorie malnutrition: Secondary | ICD-10-CM | POA: Diagnosis present

## 2019-11-28 LAB — COMPREHENSIVE METABOLIC PANEL
ALT: 14 U/L (ref 0–44)
AST: 16 U/L (ref 15–41)
Albumin: 2.9 g/dL — ABNORMAL LOW (ref 3.5–5.0)
Alkaline Phosphatase: 82 U/L (ref 38–126)
Anion gap: 20 — ABNORMAL HIGH (ref 5–15)
BUN: 32 mg/dL — ABNORMAL HIGH (ref 8–23)
CO2: 18 mmol/L — ABNORMAL LOW (ref 22–32)
Calcium: 9.1 mg/dL (ref 8.9–10.3)
Chloride: 109 mmol/L (ref 98–111)
Creatinine, Ser: 1.28 mg/dL — ABNORMAL HIGH (ref 0.61–1.24)
GFR calc Af Amer: 57 mL/min — ABNORMAL LOW (ref >60)
GFR calc non Af Amer: 49 mL/min — ABNORMAL LOW (ref >60)
Glucose, Bld: 170 mg/dL — ABNORMAL HIGH (ref 70–99)
Potassium: 3.5 mmol/L (ref 3.5–5.1)
Sodium: 147 mmol/L — ABNORMAL HIGH (ref 135–145)
Total Bilirubin: 1.2 mg/dL (ref 0.3–1.2)
Total Protein: 6.5 g/dL (ref 6.5–8.1)

## 2019-11-28 LAB — CBC WITH DIFFERENTIAL/PLATELET
Abs Immature Granulocytes: 0.07 10*3/uL (ref 0.00–0.07)
Basophils Absolute: 0 10*3/uL (ref 0.0–0.1)
Basophils Relative: 0 %
Eosinophils Absolute: 0 10*3/uL (ref 0.0–0.5)
Eosinophils Relative: 0 %
HCT: 39.2 % (ref 39.0–52.0)
Hemoglobin: 12 g/dL — ABNORMAL LOW (ref 13.0–17.0)
Immature Granulocytes: 1 %
Lymphocytes Relative: 7 %
Lymphs Abs: 0.8 10*3/uL (ref 0.7–4.0)
MCH: 30.5 pg (ref 26.0–34.0)
MCHC: 30.6 g/dL (ref 30.0–36.0)
MCV: 99.5 fL (ref 80.0–100.0)
Monocytes Absolute: 1.2 10*3/uL — ABNORMAL HIGH (ref 0.1–1.0)
Monocytes Relative: 11 %
Neutro Abs: 8.7 10*3/uL — ABNORMAL HIGH (ref 1.7–7.7)
Neutrophils Relative %: 81 %
Platelets: 370 10*3/uL (ref 150–400)
RBC: 3.94 MIL/uL — ABNORMAL LOW (ref 4.22–5.81)
RDW: 14.2 % (ref 11.5–15.5)
WBC: 10.7 10*3/uL — ABNORMAL HIGH (ref 4.0–10.5)
nRBC: 0 % (ref 0.0–0.2)

## 2019-11-28 LAB — TROPONIN I (HIGH SENSITIVITY)
Troponin I (High Sensitivity): 235 ng/L (ref <18)
Troponin I (High Sensitivity): 220 ng/L (ref <18)

## 2019-11-28 LAB — SARS CORONAVIRUS 2 (TAT 6-24 HRS): SARS Coronavirus 2: NEGATIVE

## 2019-11-28 LAB — GLUCOSE, CAPILLARY: Glucose-Capillary: 138 mg/dL — ABNORMAL HIGH (ref 70–99)

## 2019-11-28 LAB — BRAIN NATRIURETIC PEPTIDE: B Natriuretic Peptide: 1428.2 pg/mL — ABNORMAL HIGH (ref 0.0–100.0)

## 2019-11-28 MED ORDER — POLYVINYL ALCOHOL 1.4 % OP SOLN
1.0000 [drp] | OPHTHALMIC | Status: DC | PRN
  Filled 2019-11-28: qty 15

## 2019-11-28 MED ORDER — ENOXAPARIN SODIUM 40 MG/0.4ML ~~LOC~~ SOLN
40.0000 mg | SUBCUTANEOUS | Status: DC
  Administered 2019-11-28: 21:00:00 40 mg via SUBCUTANEOUS
  Filled 2019-11-28 (×2): qty 0.4

## 2019-11-28 MED ORDER — ACETAMINOPHEN 650 MG RE SUPP: 650.0000 mg | Freq: Four times a day (QID) | RECTAL | Status: DC | PRN

## 2019-11-28 MED ORDER — LOSARTAN POTASSIUM 50 MG PO TABS: 25.0000 mg | ORAL_TABLET | Freq: Two times a day (BID) | ORAL | Status: DC

## 2019-11-28 MED ORDER — ONDANSETRON HCL 4 MG PO TABS: 4.0000 mg | ORAL_TABLET | Freq: Four times a day (QID) | ORAL | Status: DC | PRN

## 2019-11-28 MED ORDER — ESCITALOPRAM OXALATE 10 MG PO TABS: 5.0000 mg | ORAL_TABLET | Freq: Every day | ORAL | Status: DC

## 2019-11-28 MED ORDER — LATANOPROST 0.005 % OP SOLN
1.0000 [drp] | Freq: Every day | OPHTHALMIC | Status: DC
  Administered 2019-11-28 – 2019-12-03 (×6): 1 [drp] via OPHTHALMIC
  Filled 2019-11-28: qty 2.5

## 2019-11-28 MED ORDER — LEVOTHYROXINE SODIUM 50 MCG PO TABS
50.0000 ug | ORAL_TABLET | Freq: Every day | ORAL | Status: DC
  Administered 2019-11-29 – 2019-12-03 (×5): 50 ug via ORAL
  Filled 2019-11-28 (×5): qty 1

## 2019-11-28 MED ORDER — ONDANSETRON HCL 4 MG/2ML IJ SOLN: 4.0000 mg | Freq: Four times a day (QID) | INTRAMUSCULAR | Status: DC | PRN

## 2019-11-28 MED ORDER — CLOPIDOGREL BISULFATE 75 MG PO TABS
75.0000 mg | ORAL_TABLET | Freq: Every day | ORAL | Status: DC
  Administered 2019-11-29 – 2019-12-01 (×3): 75 mg via ORAL
  Filled 2019-11-28 (×5): qty 1

## 2019-11-28 MED ORDER — PANTOPRAZOLE SODIUM 40 MG PO TBEC: 40.0000 mg | DELAYED_RELEASE_TABLET | Freq: Two times a day (BID) | ORAL | Status: DC

## 2019-11-28 MED ORDER — FERROUS SULFATE 325 (65 FE) MG PO TABS
325.0000 mg | ORAL_TABLET | Freq: Every day | ORAL | Status: DC
  Administered 2019-11-29 – 2019-12-01 (×3): 325 mg via ORAL
  Filled 2019-11-28 (×6): qty 1

## 2019-11-28 MED ORDER — ISOSORBIDE MONONITRATE ER 30 MG PO TB24: 60.0000 mg | ORAL_TABLET | Freq: Every evening | ORAL | Status: DC

## 2019-11-28 MED ORDER — VITAMIN B-12 500 MCG PO TABS: 500.0000 ug | ORAL_TABLET | Freq: Every day | ORAL | Status: DC

## 2019-11-28 MED ORDER — VITAMIN B-12 1000 MCG PO TABS
500.0000 ug | ORAL_TABLET | Freq: Every day | ORAL | Status: DC
  Administered 2019-11-29 – 2019-12-01 (×3): 500 ug via ORAL
  Filled 2019-11-28 (×5): qty 1

## 2019-11-28 MED ORDER — ACETAMINOPHEN 325 MG PO TABS
650.0000 mg | ORAL_TABLET | Freq: Four times a day (QID) | ORAL | Status: DC | PRN
  Administered 2019-12-02: 650 mg via ORAL
  Filled 2019-11-28: qty 2

## 2019-11-28 MED ORDER — ROSUVASTATIN CALCIUM 20 MG PO TABS
20.0000 mg | ORAL_TABLET | Freq: Every day | ORAL | Status: DC
  Administered 2019-11-28 – 2019-12-01 (×3): 20 mg via ORAL
  Filled 2019-11-28 (×5): qty 1

## 2019-11-28 MED ORDER — ALPRAZOLAM 0.25 MG PO TABS
0.5000 mg | ORAL_TABLET | Freq: Three times a day (TID) | ORAL | Status: DC | PRN
  Administered 2019-11-30 – 2019-12-02 (×3): 0.5 mg via ORAL
  Filled 2019-11-28 (×3): qty 2

## 2019-11-28 MED ORDER — SODIUM CHLORIDE 0.9% FLUSH
3.0000 mL | Freq: Two times a day (BID) | INTRAVENOUS | Status: DC
  Administered 2019-11-30 – 2019-12-05 (×10): 3 mL via INTRAVENOUS

## 2019-11-28 MED ORDER — SODIUM CHLORIDE 0.9 % IV BOLUS
500.0000 mL | Freq: Once | INTRAVENOUS | Status: AC
  Administered 2019-11-28: 500 mL via INTRAVENOUS

## 2019-11-28 MED ORDER — ALBUTEROL SULFATE (2.5 MG/3ML) 0.083% IN NEBU
2.5000 mg | INHALATION_SOLUTION | Freq: Four times a day (QID) | RESPIRATORY_TRACT | Status: DC | PRN
  Administered 2019-12-03: 2.5 mg via RESPIRATORY_TRACT
  Filled 2019-11-28: qty 3

## 2019-11-28 MED ORDER — DEXTROSE 5 % IV SOLN: INTRAVENOUS | Status: DC

## 2019-11-28 MED ORDER — SODIUM CHLORIDE 0.9 % IV SOLN: INTRAVENOUS | Status: DC

## 2019-11-28 MED ORDER — SOOTHE XP OP SOLN: 1.0000 [drp] | Freq: Every day | OPHTHALMIC | Status: DC | PRN

## 2019-11-28 NOTE — Consult Note (Addendum)
Cardiology Consultation:   Patient ID: Kenneth [name] MRN: [id number]; DOB: [birth year]  Admit date: 11/28/2019 Date of Consult: 11/28/2019  Primary Care Provider: Lavone Orn, MD Primary Cardiologist: Sinclair Grooms, MD  Primary Electrophysiologist:  None    Patient Profile:   Kenneth [name] is a 85 y.o. male with a hx of CAD s/p remote CABG, hypothyroidism, hypertension, hyperlipidemia, DM2, GIB, and recent STEMI treated with PCI/DES to the SVG-PDA who is being seen today for the evaluation of elevated troponin at the request of Dr. Tamala [facility].  History of Present Illness:   History obtained from chart review. Patient unable to answer questions.  [name] is followed by Dr. Claiborne Billings. Echo in 2019 showed preserved EF with G1DD. Patient was seen in the office August 2020 and was doing relatively well. The patient was admitted 11/27-22/29 for diverticulitis and was treated conservatively. The patient was admitted 12/7 - 12/11 for STEMI. He was brought to the cath lab and underwent successful PCI/DES to the SVG-PDA which required extensive thrombectomy. He had severe LV systolic dysfunction post MI, EF 20-25%. HS troponin peaked at 10,700. He was started on Brilinta and a statin. ASA was stopped due to GI bleeding risk. The patient was re-admitted 12/14 - 12/19 for acute stroke of the right brain with extraction thrombectomy with dramatic improvement. Cardiology was consulted for possible STEMI or CHF which neither was felt to be present. EKG was unchanged. Patient became a little anemic during the stay and he was switched from Brilinta to plavix. He was discharged to a nursing home.   The patient presented to the ED 11/28/19 for progressive weakness that started the night before. They also reported nausea and lethargy. EMS was called to take him to the ED and EKG was reporting code STEMI, but after review of the EKG seemed to be unchanged and CODE STEMI was canceled.   In the ED BP 98/76,  pulse 117, afebrile, RR 28, 98% O2. Labs revealed sodium 147, CO2 18, glucose 170, creatinine 1.29, BUN 32, albumin 2.9, GFR 49. WBC 10.7, Hgb 12.0. BNP 1,428. HS troponin 235. EKG with no worrisome changes. CXR showed no acute disease. Patient denies chest pain. The patient was admitted for further work-up.   On exam the patient is oriented x 1. He keeps asking for water. He denies chest pain and says his breathing is OK.   Heart Pathway Score:     Past Medical History:  Diagnosis Date   Anemia    on iron   Anxiety    Arthritis    Basal cell carcinoma    left ear   CAD (coronary artery disease)    a. CAD s/p CABG 1993.   Chronic cough    Chronic rhinitis    Complication of anesthesia    Diabetes ([place])    Diverticulosis of colon    GERD (gastroesophageal reflux disease)    Glaucoma    H/O hiatal hernia    Hernia    left side   History of gout    History of kidney stones    HTN (hypertension)    Hyperlipidemia `   Melanoma of skin, site unspecified    PONV (postoperative nausea and vomiting)    [facility] spotted fever 2-3 yrs ago   Thyroid disease     Past Surgical History:  Procedure Laterality Date   COLONOSCOPY WITH PROPOFOL N/A 11/27/2013   Procedure: COLONOSCOPY WITH PROPOFOL;  Surgeon: Garlan Fair, MD;  Location: [facility];  Service: Endoscopy;  Laterality: N/A;   COLOSTOMY TAKEDOWN N/A 03/16/2016   Procedure: COLOSTOMY TAKEDOWN;  Surgeon: Excell Seltzer, MD;  Location: [facility];  Service: General;  Laterality: N/A;   CORONARY ARTERY BYPASS GRAFT  1990's   x 3   CORONARY/GRAFT ACUTE MI REVASCULARIZATION N/A 11/12/2019   Procedure: CORONARY/GRAFT ACUTE MI REVASCULARIZATION;  Surgeon: Troy Sine, MD;  Location: [facility];  Service: Cardiovascular;  Laterality: N/A;   HERNIA REPAIR  1970's,15   lft ing hernia   INGUINAL HERNIA REPAIR Left 06/28/2014   Procedure: Left  INGUINAL HERNIA REPAIR ;  Surgeon: Merrie Roof, MD;  [facility];   Service: General;  Laterality: Left;   INSERTION OF MESH Left 06/28/2014   Procedure: INSERTION OF MESH;  Surgeon: Merrie Roof, MD;  [facility];  Service: General;  Laterality: Left;   INSERTION OF MESH N/A 10/11/2014   IR CT HEAD LTD  11/19/2019   IR PERCUTANEOUS ART THROMBECTOMY/INFUSION INTRACRANIAL INC DIAG ANGIO  11/19/2019   LAPAROSCOPY  10/11/2014   LAPAROTOMY N/A 09/11/2015   Procedure: EXPLORATORY LAPAROTOMY;  Surgeon: Excell Seltzer, MD;  [facility];  Service: General;  Laterality: N/A;   MASS EXCISION Left 03/21/2017   Procedure: EXCISION BASAL CELL CARCINOMA LEFT EAR;  Surgeon: Leta Baptist, MD;  Location: [facility];  Service: ENT;  Laterality: Left;   PARTIAL COLECTOMY N/A 09/11/2015   Procedure: SIGMOID COLECTOMY AND COLOSTOMY;  Surgeon: Excell Seltzer, MD;  [facility];  Service: General;  Laterality: N/A;   RADIOLOGY WITH ANESTHESIA N/A 11/19/2019   Procedure: RADIOLOGY WITH ANESTHESIA;  Surgeon: Luanne Bras, MD;  Location: [facility];  Service: Radiology;  Laterality: N/A;   right leg fracture age 8     SKIN FULL THICKNESS GRAFT Left 03/21/2017   Procedure: FULL THICKNESS SKIN GRAFT TO LEFT EAR;  Surgeon: Leta Baptist, MD;  Location: [place];  Service: ENT;  Laterality: Left;   TONSILLECTOMY  age 12 or [place]  10/11/2014   Procedure: HERNIA REPAIR VENTRAL ADULT;  Surgeon: Autumn Messing III, MD;  Location: [place];  Service: General;;     Home Medications:  Prior to Admission medications   Medication Sig Start Date End Date Taking? Authorizing Provider  acetaminophen (TYLENOL) 325 MG tablet Take 325 mg by mouth every 6 (six) hours as needed for mild pain.     [provider]  ALPRAZolam Duanne Moron) 0.5 MG tablet Take 1 tablet (0.5 mg total) by mouth 3 (three) times daily as needed for anxiety. 11/24/19   Rinehuls, Early Chars, PA-C  Artificial Tear Solution (SOOTHE XP OP) Apply 1 drop to eye as needed (dry eyes).      [provider]  clopidogrel (PLAVIX) 75 MG tablet Take 1 tablet (75 mg total) by mouth daily. 11/24/19   Rinehuls, Early Chars, PA-C  Cyanocobalamin (VITAMIN B-12 PO) Take 1 tablet by mouth daily.    [provider]  ferrous sulfate 325 (65 FE) MG tablet Take 325 mg by mouth daily with breakfast.    [provider]  isosorbide mononitrate (IMDUR) 60 MG 24 hr tablet Take 60 mg by mouth every evening.     [provider]  latanoprost (XALATAN) 0.005 % ophthalmic solution Place 1 drop into both eyes at bedtime.    [provider]  levothyroxine (SYNTHROID) 50 MCG tablet Take 1 tablet (50 mcg total) by mouth daily at  6 (six) AM. 11/25/19   Rinehuls, Early Chars, PA-C  losartan (COZAAR) 25 MG tablet Take 0.5 tablets (12.5 mg total) by mouth daily. 11/16/19   Cheryln Manly, NP  metoprolol succinate (TOPROL-XL) 25 MG 24 hr tablet Take 0.5 tablets (12.5 mg total) by mouth daily. 11/24/19   Rinehuls, Early Chars, PA-C  Multiple Vitamin (MULTIVITAMIN WITH MINERALS) TABS tablet Take 1 tablet by mouth daily.    [provider]  nitroGLYCERIN (NITROSTAT) 0.4 MG SL tablet Place 1 tablet (0.4 mg total) under the tongue every 5 (five) minutes as needed. 11/16/19   Cheryln Manly, NP  ondansetron (ZOFRAN) 4 MG tablet Take 1 tablet (4 mg total) by mouth every 6 (six) hours as needed for nausea. 11/04/19   Alma Friendly, MD  pantoprazole (PROTONIX) 40 MG tablet Take 1 tablet (40 mg total) by mouth 2 (two) times daily. 11/24/19   Rinehuls, Early Chars, PA-C  potassium chloride (K-DUR,KLOR-CON) 10 MEQ tablet Take 10 mEq by mouth every Monday, Wednesday, and Friday.  06/27/18   [provider]  rosuvastatin (CRESTOR) 20 MG tablet Take 1 tablet (20 mg total) by mouth daily at 6 PM. 11/16/19   Cheryln Manly, NP    Inpatient Medications: Scheduled Meds:  Continuous Infusions:  PRN Meds:   Allergies:    Allergies  Allergen Reactions   Lasix  [Furosemide] Other (See Comments)    aches   Spironolactone Other (See Comments)    Red, puffy, eyes, almost hospitalized   Aspirin Other (See Comments)    Bleeding.     Lipitor [Atorvastatin] Other (See Comments)    tired   Lopressor [Metoprolol Tartrate] Other (See Comments)    Sleepy all the time 11.27.2020 Patient is currently taking Lopressor.   Penicillins Hives and Rash    Has patient had a PCN reaction causing immediate rash, facial/tongue/throat swelling, SOB or lightheadedness with hypotension: No Has patient had a PCN reaction causing severe rash involving mucus membranes or skin necrosis: No Has patient had a PCN reaction that required hospitalization No Has patient had a PCN reaction occurring within the last 10 years: Yes If all of the above answers are "NO", then may proceed with Cephalosporin use.     Social History:   Social History   Socioeconomic History   Marital status: Married    Spouse name: Not on file   Number of children: Not on file   Years of education: Not on file   Highest education level: Not on file  Occupational History   Not on file  Tobacco Use   Smoking status: Former Smoker    Types: Cigars   Smokeless tobacco: Never Used  Substance and Sexual Activity   Alcohol use: Yes    Comment: RARE   Drug use: No    Comment: occ cigars   Sexual activity: Not on file  Other Topics Concern   Not on file  Social History Narrative   Not on file   Social Determinants of Health   Financial Resource Strain:    Difficulty of Paying Living Expenses: Not on file  Food Insecurity:    Worried About [place] in the Last Year: Not on file   [employer] of Food in the Last Year: Not on file  Transportation Needs:    Lack of Transportation (Medical): Not on file   Lack of Transportation (Non-Medical): Not on file  Physical Activity:    Days of Exercise per Week: Not on  file   Minutes of Exercise per Session: Not on file  Stress:    Feeling  of Stress : Not on file  Social Connections:    Frequency of Communication with Friends and Family: Not on file   Frequency of Social Gatherings with Friends and Family: Not on file   Attends Religious Services: Not on file   Active Member of Clubs or Organizations: Not on file   Attends Archivist Meetings: Not on file   Marital Status: Not on file  Intimate Partner Violence:    Fear of Current or Ex-Partner: Not on file   Emotionally Abused: Not on file   Physically Abused: Not on file   Sexually Abused: Not on file    Family History:   Family History  Problem Relation Age of Onset   CAD Father    Hyperlipidemia Father    Cancer Mother      ROS:  Please see the history of present illness.  All other ROS reviewed and negative.     Physical Exam/Data:   Vitals:   11/28/19 1430 11/28/19 1445 11/28/19 1500 11/28/19 1530  BP: 117/75  98/76 117/83  Pulse:  (!) 124 (!) 117 (!) 125  Resp: (!) 43 20 (!) 28 16  Temp:      TempSrc:      SpO2:  97% 98% 95%  Weight:      Height:       No intake or output data in the 24 hours ending 11/28/19 1541 Last 3 Weights 11/28/2019 11/19/2019 11/16/2019  Weight (lbs) 140 lb 149 lb 14.6 oz 148 lb 1.9 oz  Weight (kg) 63.504 kg 68 kg 67.187 kg     Body mass index is 22.6 kg/m.  General: Frail WM in no acute distress HEENT: normal Lymph: no adenopathy Neck: no JVD Endocrine:  No thryomegaly Vascular: No carotid bruits; FA pulses 2+ bilaterally without bruits  Cardiac:  normal S1, S2; sinus tachycardia; no murmur  Lungs:  clear to auscultation bilaterally, no wheezing, rhonchi or rales  Abd: soft, nontender, no hepatomegaly  Ext: no edema Musculoskeletal:  No deformities, BUE and BLE strength normal and equal Skin: warm and dry  Neuro:  Oriented x 1 Psych:  Normal affect   EKG:  The EKG was personally reviewed and demonstrates:  Sinus tachycardia 126 bpm, RBBB, ST depression V3, nonspecific St/T wave changes inferior  leads, minimal ST III Telemetry:  Telemetry was personally reviewed and demonstrates:  Sinus tachycardia, HR in the 120s, PACs and PVCs  Relevant CV Studies:  Cath: 11/12/19   Ost RCA to Prox RCA lesion is 100% stenosed. Ost LM to Mid LM lesion is 100% stenosed. Prox Graft to Mid Graft lesion is 95% stenosed. A stent was successfully placed. Post intervention, there is a 0% residual stenosis. Origin to Prox Graft lesion is 99% stenosed. Post intervention, there is a 0% residual stenosis. Dist LM to Prox LAD lesion is 100% stenosed with 100% stenosed side branch in Ost Cx to Dist Cx. RPDA lesion is 100% stenosed.   Severe native CAD with total occlusion of the left main and ostial RCA.   Patent LIMA graft supplying the LAD   Patent SVG supplying the obtuse marginal vessel with collateralization to the distal diagonal vessel   Stents of thrombotic occlusion of the vein graft supplying a large dominant RCA PDA system.   Successful PCI to the SVG supplying the PDA requiring extensive percutaneous thrombectomy ultimate DES stenting with a 3.5  x 38 mm Resolute stent with the stenoses being reduced to 0%.  There is abrupt cut off of the very distal PDA most likely due to embolic occlusion.   Hand-injection left ventriculography revealed an EF estimate at 35 to 40%.  There is moderate inferior hypocontractility and a small focal region of apical akinesis.  LVEDP is 10 mm.   RECOMMENDATION; The patient received ticagrelor upon leaving the lab with plans to discontinue cangrelor once in the ICU.  Plan to discontinue Aggrastat after 2 to 4 hours and bivalirudin upon completion of his current bag.  The case was discussed at length with Dr. Tamala [facility] who follows the patient.  With his GI history other than 12 months of DAPT, will plan for short-term antiplatelet therapy per Resolute Onyx stent data.   Diagnostic Dominance: Right  Intervention    TTE: 11/12/19   IMPRESSIONS      1. Severely  reduce LVEF, with akinesis and hypokinesis in septal and inferior walls. Changed from prior echo in 2019.  2. Left ventricular ejection fraction, by visual estimation, is 20 to 25%. The left ventricle has severely decreased function. There is no left ventricular hypertrophy.  3. Multiple segmental abnormalities exist. See findings.  4. Definity contrast agent was given IV to delineate the left ventricular endocardial borders.  5. Left ventricular diastolic parameters are indeterminate.  6. The left ventricle demonstrates regional wall motion abnormalities.  7. Global right ventricle has moderately reduced systolic function.The right ventricular size is normal. No increase in right ventricular wall thickness.  8. Left atrial size was mildly dilated.  9. Right atrial size was mildly dilated. 10. The mitral valve is grossly normal. Mild mitral valve regurgitation. 11. The tricuspid valve is normal in structure. Tricuspid valve regurgitation is trivial. 12. The aortic valve is tricuspid. Aortic valve regurgitation is not visualized. Mild aortic valve sclerosis without stenosis. 13. The pulmonic valve was not well visualized. Pulmonic valve regurgitation NA.  TEE 11/19/19 IMPRESSIONS      1. Left ventricular ejection fraction, by visual estimation, is 20 to 25%. The left ventricle has normal function. There is no left ventricular hypertrophy.  2. Left ventricular diastolic parameters are indeterminate.  3. The left ventricle demonstrates global hypokinesis.  4. COmpared to previous echo on 11/12/19, LVEF is unchanged.  5. Global right ventricle mild to moderately reduced..The right ventricular size is normal. No increase in right ventricular wall thickness.  6. Left atrial size was moderately dilated.  7. Right atrial size was normal.  8. The mitral valve is grossly normal. Moderate mitral valve regurgitation.  9. The tricuspid valve is normal in structure. Tricuspid valve regurgitation is  trivial. 10. The aortic valve is tricuspid. Aortic valve regurgitation is not visualized. Mild aortic valve stenosis. 11. The pulmonic valve was normal in structure. Pulmonic valve regurgitation is not visualized. 12. The aortic root was not well visualized. 13. The inferior vena cava is normal in size with greater than 50% respiratory variability, suggesting right atrial pressure of 3 mmHg.  Laboratory Data:  High Sensitivity Troponin:   Recent Labs  Lab 11/12/19 1045 11/12/19 1327 11/12/19 1448 11/20/19 1411 11/28/19 1320  TROPONINIHS 8,449* 10,700* 9,411* 903* 235*     Chemistry Recent Labs  Lab 11/23/19 0224 11/24/19 0301 11/28/19 1320  NA 141 139 147*  K 3.4* 3.9 3.5  CL 109 110 109  CO2 21* 20* 18*  GLUCOSE 153* 126* 170*  BUN 20 14 32*  CREATININE 0.79 0.75 1.28*  CALCIUM 8.4*  8.2* 9.1  GFRNONAA >60 >60 49*  GFRAA >60 >60 57*  ANIONGAP 11 9 20*    Recent Labs  Lab 11/28/19 1320  PROT 6.5  ALBUMIN 2.9*  AST 16  ALT 14  ALKPHOS 82  BILITOT 1.2   Hematology Recent Labs  Lab 11/23/19 0224 11/24/19 0301 11/28/19 1320  WBC 7.5 8.1 10.7*  RBC 3.00* 2.99* 3.94*  HGB 9.2* 9.2* 12.0*  HCT 30.0* 30.2* 39.2  MCV 100.0 101.0* 99.5  MCH 30.7 30.8 30.5  MCHC 30.7 30.5 30.6  RDW 13.9 14.0 14.2  PLT 468* 451* 370   BNP Recent Labs  Lab 11/28/19 1320  BNP 1,428.2*    DDimer No results for input(s): DDIMER in the last 168 hours.   Radiology/Studies:  DG Chest Port 1 View  Result Date: 11/28/2019 CLINICAL DATA:  Weakness, nausea and lethargy since last night. EXAM: PORTABLE CHEST 1 VIEW COMPARISON:  Single-view of the chest 11/20/2019 and 09/10/2017. FINDINGS: Scattered punctate calcified granulomata are seen bilaterally, unchanged. Lungs otherwise clear. No pneumothorax or pleural effusion. Heart size is upper normal. Atherosclerosis is seen. Large hiatal hernia is identified. No acute or focal bony abnormality. IMPRESSION: No acute disease. Hiatal  hernia. Electronically Signed   By: Inge Rise M.D.   On: 11/28/2019 12:52   {  Assessment and Plan:   Weakness Presented from nursing home with weakness and lethargy. Patient had recent stroke and was discharged 12/19.  - Patient is afebrile, soft pressures, and tachycardic -  Sodium 147 - Creatinine 1.29, BUN 32. 12/19 Scr/BUN ratio 0.75/14 - BNP 1,428. CXR normal - Weakness possibly from dehydration - Unsure of his baseline mental status, but could consider repeat imaging of the head - further work-up per IM  Elevated troponin/CAD s/p PCI SVG-PDA Patient had recent STEMI with successful PCI/DES to the SVG-PDA which required extensive thrombectomy. He was initially placed on Brilinta. Aspirin was stopped due to GIB risk. During his last hospital admission he was anemic and Brilinta was switched to Plavix. There is no reason to think patient has missed doses of his antiplatelet therapy.  - Patient denies chest pain - HS troponin 235 > 220, not consistent with ACS - EKG wit no worrisome changes - Given recent cath and no chest pain do not suspect any further ischemic work-up will be done.  - continue Plavix  Chronic systolic and diastolic HF/Ischemic cardiomyopathy - Last EF was 20-25% - BNP 1,428 - CXR normal - was previously on lasix, but this was held for hypotension - Patient is euvolemic on exam - continue Toprol - Recommend gentle hydration for low EF  HLD - on crestor at baseline   Hypertension - home meds include Imdur 60 mg daily, losartan 12.5 mg daily, Toprol 12.5 mg daily - Pressures on admission were low, latest was 111/75 - Hold losartan for kidney function  AKI - 1. 29 on admission, was 0.75 on 12/19 - hold losartan as above - Gentle hydration  Hypernatremia - 147 on admission - management as above   For questions or updates, please contact [facility] Please consult [email or web address] for contact info under     Signed, Cadence Ninfa Meeker, PA-C   11/28/2019 3:41 PM  Agree with note by Cadence Kathlen Mody, PA-C  Mr. Mcqueary is an 85 year old gentleman patient of Dr. Tam[facility] who had CABG in 1993.  He had an inferior STEMI earlier this month and underwent emergency catheterization by Dr. Claiborne Billings revealing a patent LIMA to the LAD, patent vein  to the obtuse marginal branch and a thrombotically subtotally occluded vein to the PDA.  All his native vessels were occluded.  His EF was in the 20% range.  He underwent aspiration thrombectomy, PCI drug-eluting stenting to his PDA vein graft.  He was readmitted 4 days after discharge with a stroke and his Brilinta was changed to Plavix.  He was discharged home to a rehab facility and presents today with weakness.  He denies chest pain.  He is disoriented.  His troponins are in the 200 range but flat.  His EKG shows sinus tach with nonspecific ST and T wave changes.  His hemoglobin is mildly reduced.  His exam is benign.  His blood pressure is soft with systolic in the [id number] range.  He does not appear to be in heart failure.  At this point, there is no evidence of ACS.  I do not see a need to systemically anticoagulate him other than DVT prophylaxis.  No further cardiology evaluation is indicated at this time.   Lorretta Harp, M.D., [place], [facility], [place] [address] [place], [place]  [id number]  [phone] 11/28/2019 6:05 PM

## 2019-11-28 NOTE — ED Provider Notes (Signed)
[facility] EMERGENCY DEPARTMENT Provider Note   CSN: [id number] Arrival date & time: 11/28/19  1210     History Chief Complaint  Patient presents with  . Weakness    Kenneth [name] is a 85 y.o. male.  Patient was recently discharged from the hospital with a stroke and also this month has had an MRI.  He was sent to the emergency department for progressive weakness over the last 24 hours  The history is provided by the nursing home and medical records. No language interpreter was used.  Weakness Severity:  Moderate Onset quality:  Sudden Timing:  Constant Progression:  Worsening Chronicity:  Recurrent Context: not alcohol use   Relieved by:  Nothing Worsened by:  Nothing Ineffective treatments:  None tried Associated symptoms: no abdominal pain, no chest pain, no cough, no diarrhea, no frequency, no headaches and no seizures   Risk factors: no anemia        Past Medical History:  Diagnosis Date  . Anemia    on iron  . Anxiety   . Arthritis   . Basal cell carcinoma    left ear  . CAD (coronary artery disease)    a. CAD s/p CABG 1993.  [name] Chronic cough   . Chronic rhinitis   . Complication of anesthesia   . Diabetes ([place])   . Diverticulosis of colon   . GERD (gastroesophageal reflux disease)   . Glaucoma   . H/O hiatal hernia   . Hernia    left side  . History of gout   . History of kidney stones   . HTN (hypertension)   . Hyperlipidemia `  . Melanoma of skin, site unspecified   . PONV (postoperative nausea and vomiting)   . [facility] spotted fever 2-3 yrs ago  . Thyroid disease     Patient Active Problem List   Diagnosis Date Noted  . Hx of CABG   . Pure hypercholesterolemia   . Acute embolic stroke ([place]) 0000000  . 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 Acute systolic heart failure ([place]) 11/16/2019  . Acute ST elevation myocardial infarction (STEMI) of posterior wall ([place]) 11/12/2019  . ST elevation myocardial  infarction involving right coronary artery ([place])   . Diverticulitis 11/02/2019  . Colostomy status ([place]) 03/16/2016  . Elevated troponin 09/13/2015  . Hemoptysis   . Nausea and vomiting 09/09/2015  . Small bowel obstruction ([place]) 09/09/2015  . Hypertensive urgency 09/09/2015  . Diabetes mellitus ([place]) 09/09/2015  . Colonic mass   . Intestinal obstruction ([place])   . Preop cardiovascular exam 06/27/2014  . Left inguinal hernia 06/26/2014  . Dysphagia 09/28/2013    Class: Chronic  . Abnormal weight loss 09/28/2013    Class: Chronic  . HTN (hypertension)   . Coronary artery disease involving coronary bypass graft of native heart without angina pectoris   . Hyperlipidemia with target LDL less than 70   . GERD (gastroesophageal reflux disease)   . Nephrolithiasis   . Melanoma of skin, site unspecified   . Glaucoma     Past Surgical History:  Procedure Laterality Date  . COLONOSCOPY WITH PROPOFOL N/A 11/27/2013   Procedure: COLONOSCOPY WITH PROPOFOL;  Surgeon: Garlan Fair, MD;  [facility];  Service: Endoscopy;  Laterality: N/A;  . COLOSTOMY TAKEDOWN N/A 03/16/2016   Procedure: COLOSTOMY TAKEDOWN;  Surgeon: Excell Seltzer, MD;  [facility];  Service: General;  Laterality: N/A;  . CORONARY ARTERY BYPASS GRAFT  1990's  x 3  . CORONARY/GRAFT ACUTE MI REVASCULARIZATION N/A 11/12/2019   Procedure: CORONARY/GRAFT ACUTE MI REVASCULARIZATION;  Surgeon: Troy Sine, MD;  Location: [place];  Service: Cardiovascular;  Laterality: N/A;  . HERNIA REPAIR  1970's,15   lft ing hernia  . INGUINAL HERNIA REPAIR Left 06/28/2014   Procedure: Left  INGUINAL HERNIA REPAIR ;  Surgeon: Merrie Roof, MD;  [facility];  Service: General;  Laterality: Left;  . INSERTION OF MESH Left 06/28/2014   Procedure: INSERTION OF MESH;  Surgeon: Merrie Roof, MD;  [facility];  Service: General;  Laterality: Left;  . INSERTION OF MESH N/A 10/11/2014  . IR CT HEAD LTD   11/19/2019  . IR PERCUTANEOUS ART THROMBECTOMY/INFUSION INTRACRANIAL INC DIAG ANGIO  11/19/2019  . LAPAROSCOPY  10/11/2014  . LAPAROTOMY N/A 09/11/2015   Procedure: EXPLORATORY LAPAROTOMY;  Surgeon: Excell Seltzer, MD;  [facility];  Service: General;  Laterality: N/A;  . MASS EXCISION Left 03/21/2017   Procedure: EXCISION BASAL CELL CARCINOMA LEFT EAR;  Surgeon: Leta Baptist, MD;  Location: [facility];  Service: ENT;  Laterality: Left;  . PARTIAL COLECTOMY N/A 09/11/2015   Procedure: SIGMOID COLECTOMY AND COLOSTOMY;  Surgeon: Excell Seltzer, MD;  [facility];  Service: General;  Laterality: N/A;  . RADIOLOGY WITH ANESTHESIA N/A 11/19/2019   Procedure: RADIOLOGY WITH ANESTHESIA;  Surgeon: Luanne Bras, MD;  Location: [facility];  Service: Radiology;  Laterality: N/A;  . right leg fracture age 15    . SKIN FULL THICKNESS GRAFT Left 03/21/2017   Procedure: FULL THICKNESS SKIN GRAFT TO LEFT EAR;  Surgeon: Leta Baptist, MD;  Location: [facility];  Service: ENT;  Laterality: Left;  . TONSILLECTOMY  age 56 or 20  . VENTRAL HERNIA REPAIR  10/11/2014   Procedure: HERNIA REPAIR VENTRAL ADULT;  Surgeon: Autumn Messing III, MD;  Location: [place] [facility];  Service: General;;       Family History  Problem Relation Age of Onset  . CAD Father   . Hyperlipidemia Father   . Cancer Mother     Social History   Tobacco Use  . Smoking status: Former Smoker    Types: Cigars  . Smokeless tobacco: Never Used  Substance Use Topics  . Alcohol use: Yes    Comment: RARE  . Drug use: No    Comment: occ cigars    Home Medications Prior to Admission medications   Medication Sig Start Date End Date Taking? Authorizing Provider  acetaminophen (TYLENOL) 325 MG tablet Take 325 mg by mouth every 6 (six) hours as needed for mild pain.     [provider]  ALPRAZolam Duanne Moron) 0.5 MG tablet Take 1 tablet (0.5 mg total) by mouth 3 (three) times daily as needed for anxiety. 11/24/19    Rinehuls, Early Chars, PA-C  Artificial Tear Solution (SOOTHE XP OP) Apply 1 drop to eye as needed (dry eyes).     [provider]  clopidogrel (PLAVIX) 75 MG tablet Take 1 tablet (75 mg total) by mouth daily. 11/24/19   Rinehuls, Early Chars, PA-C  Cyanocobalamin (VITAMIN B-12 PO) Take 1 tablet by mouth daily.    [provider]  ferrous sulfate 325 (65 FE) MG tablet Take 325 mg by mouth daily with breakfast.    [provider]  isosorbide mononitrate (IMDUR) 60 MG 24 hr tablet Take 60 mg by mouth every evening.     [provider]  latanoprost (  XALATAN) 0.005 % ophthalmic solution Place 1 drop into both eyes at bedtime.    [provider]  levothyroxine (SYNTHROID) 50 MCG tablet Take 1 tablet (50 mcg total) by mouth daily at 6 (six) AM. 11/25/19   Rinehuls, Early Chars, PA-C  losartan (COZAAR) 25 MG tablet Take 0.5 tablets (12.5 mg total) by mouth daily. 11/16/19   Cheryln Manly, NP  metoprolol succinate (TOPROL-XL) 25 MG 24 hr tablet Take 0.5 tablets (12.5 mg total) by mouth daily. 11/24/19   Rinehuls, Early Chars, PA-C  Multiple Vitamin (MULTIVITAMIN WITH MINERALS) TABS tablet Take 1 tablet by mouth daily.    [provider]  nitroGLYCERIN (NITROSTAT) 0.4 MG SL tablet Place 1 tablet (0.4 mg total) under the tongue every 5 (five) minutes as needed. 11/16/19   Cheryln Manly, NP  ondansetron (ZOFRAN) 4 MG tablet Take 1 tablet (4 mg total) by mouth every 6 (six) hours as needed for nausea. 11/04/19   Alma Friendly, MD  pantoprazole (PROTONIX) 40 MG tablet Take 1 tablet (40 mg total) by mouth 2 (two) times daily. 11/24/19   Rinehuls, Early Chars, PA-C  potassium chloride (K-DUR,KLOR-CON) 10 MEQ tablet Take 10 mEq by mouth every Monday, Wednesday, and Friday.  06/27/18   [provider]  rosuvastatin (CRESTOR) 20 MG tablet Take 1 tablet (20 mg total) by mouth daily at 6 PM. 11/16/19   Reino Bellis B, NP    Allergies    Lasix  [furosemide], Spironolactone, Aspirin, Lipitor [atorvastatin], Lopressor [metoprolol tartrate], and Penicillins  Review of Systems   Review of Systems  Unable to perform ROS: Mental status change  Constitutional: Negative for appetite change and fatigue.  HENT: Negative for congestion, ear discharge and sinus pressure.   Eyes: Negative for discharge.  Respiratory: Negative for cough.   Cardiovascular: Negative for chest pain.  Gastrointestinal: Negative for abdominal pain and diarrhea.  Genitourinary: Negative for frequency and hematuria.  Musculoskeletal: Negative for back pain.  Skin: Negative for rash.  Neurological: Positive for weakness. Negative for seizures and headaches.  Psychiatric/Behavioral: Negative for hallucinations.    Physical Exam Updated Vital Signs BP 98/76   Pulse (!) 117   Temp (!) 97.4 F (36.3 C) (Axillary)   Resp (!) 28   Ht 5\' 6"  (1.676 m)   Wt 63.5 kg   SpO2 98%   BMI 22.60 kg/m   Physical Exam Vitals and nursing note reviewed.  Constitutional:      Appearance: He is well-developed.  HENT:     Head: Normocephalic.     Comments: Mucous membranes dry    Nose: Nose normal.  Eyes:     General: No scleral icterus.    Conjunctiva/sclera: Conjunctivae normal.  Neck:     Thyroid: No thyromegaly.  Cardiovascular:     Rate and Rhythm: Normal rate and regular rhythm.     Heart sounds: No murmur. No friction rub. No gallop.   Pulmonary:     Breath sounds: No stridor. No wheezing or rales.  Chest:     Chest wall: No tenderness.  Abdominal:     General: There is no distension.     Tenderness: There is no abdominal tenderness. There is no rebound.  Musculoskeletal:        General: Normal range of motion.     Cervical back: Neck supple.  Lymphadenopathy:     Cervical: No cervical adenopathy.  Skin:    Findings: No erythema or rash.  Neurological:     Mental  Status: He is oriented to person, place, and time.     Motor: No abnormal muscle tone.      Coordination: Coordination normal.  Psychiatric:        Behavior: Behavior normal.     ED Results / Procedures / Treatments   Labs (all labs ordered are listed, but only abnormal results are displayed) Labs Reviewed  COMPREHENSIVE METABOLIC PANEL - Abnormal; Notable for the following components:      Result Value   Sodium 147 (*)    CO2 18 (*)    Glucose, Bld 170 (*)    BUN 32 (*)    Creatinine, Ser 1.28 (*)    Albumin 2.9 (*)    GFR calc non Af Amer 49 (*)    GFR calc Af Amer 57 (*)    Anion gap 20 (*)    All other components within normal limits  CBC WITH DIFFERENTIAL/PLATELET - Abnormal; Notable for the following components:   WBC 10.7 (*)    RBC 3.94 (*)    Hemoglobin 12.0 (*)    Neutro Abs 8.7 (*)    Monocytes Absolute 1.2 (*)    All other components within normal limits  BRAIN NATRIURETIC PEPTIDE - Abnormal; Notable for the following components:   B Natriuretic Peptide 1,428.2 (*)    All other components within normal limits  TROPONIN I (HIGH SENSITIVITY) - Abnormal; Notable for the following components:   Troponin I (High Sensitivity) 235 (*)    All other components within normal limits  URINALYSIS, ROUTINE W REFLEX MICROSCOPIC  TROPONIN I (HIGH SENSITIVITY)    EKG None  Radiology DG Chest Port 1 View  Result Date: 11/28/2019 CLINICAL DATA:  Weakness, nausea and lethargy since last night. EXAM: PORTABLE CHEST 1 VIEW COMPARISON:  Single-view of the chest 11/20/2019 and 09/10/2017. FINDINGS: Scattered punctate calcified granulomata are seen bilaterally, unchanged. Lungs otherwise clear. No pneumothorax or pleural effusion. Heart size is upper normal. Atherosclerosis is seen. Large hiatal hernia is identified. No acute or focal bony abnormality. IMPRESSION: No acute disease. Hiatal hernia. Electronically Signed   By: Inge Rise M.D.   On: 11/28/2019 12:52    Procedures Procedures (including critical care time)  Medications Ordered in ED Medications   sodium chloride 0.9 % bolus 500 mL (500 mLs Intravenous New Bag/Given 11/28/19 1516)    ED Course  I have reviewed the triage vital signs and the nursing notes.  Pertinent labs & imaging results that were available during my care of the patient were reviewed by me and considered in my medical decision making (see chart for details).    MDM Rules/Calculators/A&P                      Patient with weakness dehydration AKI.  Patient will be admitted to medicine Final Clinical Impression(s) / ED Diagnoses Final diagnoses:  Dehydration  Weakness    Rx / DC Orders ED Discharge Orders    None       Milton Ferguson, MD 11/29/19 [phone]

## 2019-11-28 NOTE — ED Notes (Signed)
Updated pt's daughter on pt's condition

## 2019-11-28 NOTE — ED Notes (Signed)
Report given to Trixie, Therapist, sports on 3E

## 2019-11-28 NOTE — ED Notes (Signed)
ED TO INPATIENT HANDOFF REPORT  ED Nurse Name and Phone #: Lorrin Goodell [phone]  S Name/Age/Gender Kenneth [name] 85 y.o. male Room/Bed: 046C/046C  Code Status   Code Status: DNR  Home/SNF/Other Nursing Home Patient oriented to: self Is this baseline? Yes   Triage Complete: Triage complete  Chief Complaint Acute kidney injury ([place]) [N17.9] AKI (acute kidney injury) ([place]) [N17.9]  Triage Note Pt arrived via [facility] from [facility] for generalized weakness, nausea and lethargy that started last night. Pt had a recent MI and recently had 2 strokes. Per EMS 12 lead showed ST elevation in V4, v5, and V6r, STEMI canceled by cardiology. Pt is A&Ox2 to self and place. Pt is sinus tach on monitor. VS WNL.    Allergies Allergies  Allergen Reactions  . Lasix [Furosemide] Other (See Comments)    aches  . Spironolactone Other (See Comments)    Red, puffy, eyes, almost hospitalized  . Aspirin Other (See Comments)    Bleeding.    . Lipitor [Atorvastatin] Other (See Comments)    tired  . Lopressor [Metoprolol Tartrate] Other (See Comments)    Sleepy all the time 11.27.2020 Patient is currently taking Lopressor.  [name] Penicillins Hives and Rash    Has patient had a PCN reaction causing immediate rash, facial/tongue/throat swelling, SOB or lightheadedness with hypotension: No Has patient had a PCN reaction causing severe rash involving mucus membranes or skin necrosis: No Has patient had a PCN reaction that required hospitalization No Has patient had a PCN reaction occurring within the last 10 years: Yes If all of the above answers are "NO", then may proceed with Cephalosporin use.     Level of Care/Admitting Diagnosis ED Disposition    ED Disposition Condition Comment   Admit  Hospital Area: [facility] [[id number]]  Level of Care: Telemetry Cardiac [103]  I expect the patient will be discharged within 24 hours: No (not a candidate for 5C-Observation unit)  Covid Evaluation:  Asymptomatic Screening Protocol (No Symptoms)  Diagnosis: AKI (acute kidney injury) [facility]) [id number]  Admitting Physician: Norval Morton [id number]  Attending Physician: Norval Morton [id number]       B Medical/Surgery History Past Medical History:  Diagnosis Date  . Anemia    on iron  . Anxiety   . Arthritis   . Basal cell carcinoma    left ear  . CAD (coronary artery disease)    a. CAD s/p CABG 1993.  [name] Chronic cough   . Chronic rhinitis   . Complication of anesthesia   . Diabetes ([place])   . Diverticulosis of colon   . GERD (gastroesophageal reflux disease)   . Glaucoma   . H/O hiatal hernia   . Hernia    left side  . History of gout   . History of kidney stones   . HTN (hypertension)   . Hyperlipidemia `  . Melanoma of skin, site unspecified   . PONV (postoperative nausea and vomiting)   . [facility] spotted fever 2-3 yrs ago  . Thyroid disease    Past Surgical History:  Procedure Laterality Date  . COLONOSCOPY WITH PROPOFOL N/A 11/27/2013   Procedure: COLONOSCOPY WITH PROPOFOL;  Surgeon: Garlan Fair, MD;  [facility];  Service: Endoscopy;  Laterality: N/A;  . COLOSTOMY TAKEDOWN N/A 03/16/2016   Procedure: COLOSTOMY TAKEDOWN;  Surgeon: Excell Seltzer, MD;  [facility];  Service: General;  Laterality: N/A;  . CORONARY ARTERY BYPASS GRAFT  1990's   x 3  .  CORONARY/GRAFT ACUTE MI REVASCULARIZATION N/A 11/12/2019   Procedure: CORONARY/GRAFT ACUTE MI REVASCULARIZATION;  Surgeon: Troy Sine, MD;  Location: [facility];  Service: Cardiovascular;  Laterality: N/A;  . HERNIA REPAIR  1970's,15   lft ing hernia  . INGUINAL HERNIA REPAIR Left 06/28/2014   Procedure: Left  INGUINAL HERNIA REPAIR ;  Surgeon: Merrie Roof, MD;  [facility];  Service: General;  Laterality: Left;  . INSERTION OF MESH Left 06/28/2014   Procedure: INSERTION OF MESH;  Surgeon: Merrie Roof, MD;  [facility];  Service: General;  Laterality:  Left;  . INSERTION OF MESH N/A 10/11/2014  . IR CT HEAD LTD  11/19/2019  . IR PERCUTANEOUS ART THROMBECTOMY/INFUSION INTRACRANIAL INC DIAG ANGIO  11/19/2019  . LAPAROSCOPY  10/11/2014  . LAPAROTOMY N/A 09/11/2015   Procedure: EXPLORATORY LAPAROTOMY;  Surgeon: Excell Seltzer, MD;  [facility];  Service: General;  Laterality: N/A;  . MASS EXCISION Left 03/21/2017   Procedure: EXCISION BASAL CELL CARCINOMA LEFT EAR;  Surgeon: Leta Baptist, MD;  Location: [facility];  Service: ENT;  Laterality: Left;  . PARTIAL COLECTOMY N/A 09/11/2015   Procedure: SIGMOID COLECTOMY AND COLOSTOMY;  Surgeon: Excell Seltzer, MD;  [facility];  Service: General;  Laterality: N/A;  . RADIOLOGY WITH ANESTHESIA N/A 11/19/2019   Procedure: RADIOLOGY WITH ANESTHESIA;  Surgeon: Luanne Bras, MD;  Location: [facility];  Service: Radiology;  Laterality: N/A;  . right leg fracture age 49    . SKIN FULL THICKNESS GRAFT Left 03/21/2017   Procedure: FULL THICKNESS SKIN GRAFT TO LEFT EAR;  Surgeon: Leta Baptist, MD;  Location: [facility];  Service: ENT;  Laterality: Left;  . TONSILLECTOMY  age 31 or 39  . VENTRAL HERNIA REPAIR  10/11/2014   Procedure: HERNIA REPAIR VENTRAL ADULT;  Surgeon: Autumn Messing III, MD;  Location: [facility];  Service: General;;     A IV Location/Drains/Wounds Patient Lines/Drains/Airways Status   Active Line/Drains/Airways    Name:   Placement date:   Placement time:   Site:   Days:   Peripheral IV 11/28/19 Left;Anterior Forearm   11/28/19    1512    Forearm   less than 1   Colostomy Other (comment)   09/11/15    1218    Other (comment)   1539   External Urinary Catheter   11/20/19    1900    --   8   Incision (Closed) 03/21/17 Abdomen Left   03/21/17    1037     982   Incision (Closed) 03/21/17 Ear Left   03/21/17    1037     982   Incision (Closed) 11/19/19 Groin Right   11/19/19    0700     9   Incision - 1 Port Abdomen 1: Right;Upper;Lateral   10/11/14    --     1874           Intake/Output Last 24 hours  Intake/Output Summary (Last 24 hours) at 11/28/2019 1806 Last data filed at 11/28/2019 1611 Gross per 24 hour  Intake 500 ml  Output --  Net 500 ml    Labs/Imaging Results for orders placed or performed during the hospital encounter of 11/28/19 (from the past 48 hour(s))  Comprehensive metabolic panel     Status: Abnormal   Collection Time: 11/28/19  1:20 PM  Result Value Ref Range   Sodium 147 (H) 135 - 145 mmol/L  Potassium 3.5 3.5 - 5.1 mmol/L   Chloride 109 98 - 111 mmol/L   CO2 18 (L) 22 - 32 mmol/L   Glucose, Bld 170 (H) 70 - 99 mg/dL   BUN 32 (H) 8 - 23 mg/dL   Creatinine, Ser 1.28 (H) 0.61 - 1.24 mg/dL   Calcium 9.1 8.9 - 10.3 mg/dL   Total Protein 6.5 6.5 - 8.1 g/dL   Albumin 2.9 (L) 3.5 - 5.0 g/dL   AST 16 15 - 41 U/L   ALT 14 0 - 44 U/L   Alkaline Phosphatase 82 38 - 126 U/L   Total Bilirubin 1.2 0.3 - 1.2 mg/dL   GFR calc non Af Amer 49 (L) >60 mL/min   GFR calc Af Amer 57 (L) >60 mL/min   Anion gap 20 (H) 5 - 15    Comment: Performed at [facility], [address] [place], [place] [address]  CBC with Differential     Status: Abnormal   Collection Time: 11/28/19  1:20 PM  Result Value Ref Range   WBC 10.7 (H) 4.0 - 10.5 K/uL   RBC 3.94 (L) 4.22 - 5.81 MIL/uL   Hemoglobin 12.0 (L) 13.0 - 17.0 g/dL   HCT 39.2 39.0 - 52.0 %   MCV 99.5 80.0 - 100.0 fL   MCH 30.5 26.0 - 34.0 pg   MCHC 30.6 30.0 - 36.0 g/dL   RDW 14.2 11.5 - 15.5 %   Platelets 370 150 - 400 K/uL   nRBC 0.0 0.0 - 0.2 %   Neutrophils Relative % 81 %   Neutro Abs 8.7 (H) 1.7 - 7.7 K/uL   Lymphocytes Relative 7 %   Lymphs Abs 0.8 0.7 - 4.0 K/uL   Monocytes Relative 11 %   Monocytes Absolute 1.2 (H) 0.1 - 1.0 K/uL   Eosinophils Relative 0 %   Eosinophils Absolute 0.0 0.0 - 0.5 K/uL   Basophils Relative 0 %   Basophils Absolute 0.0 0.0 - 0.1 K/uL   Immature Granulocytes 1 %   Abs Immature Granulocytes 0.07 0.00 - 0.07 K/uL    Comment:  Performed at [facility], [place] [address], [place], [place] [address]  Troponin I (High Sensitivity)     Status: Abnormal   Collection Time: 11/28/19  1:20 PM  Result Value Ref Range   Troponin I (High Sensitivity) 235 (HH) <18 ng/L    Comment: CRITICAL RESULT CALLED TO, READ BACK BY AND VERIFIED WITHGareth Eagle RN [id number] BY A BENNETT (NOTE) Elevated high sensitivity troponin I (hsTnI) values and significant  changes across serial measurements may suggest ACS but many other  chronic and acute conditions are known to elevate hsTnI results.  Refer to the Links section for chest pain algorithms and additional  guidance. Performed at [facility], [place] [address] [place], [place] [address]   Brain natriuretic peptide     Status: Abnormal   Collection Time: 11/28/19  1:20 PM  Result Value Ref Range   B Natriuretic Peptide 1,428.2 (H) 0.0 - 100.0 pg/mL    Comment: Performed at [facility] [address] [place], [place] [address]  Troponin I (High Sensitivity)     Status: Abnormal   Collection Time: 11/28/19  3:00 PM  Result Value Ref Range   Troponin I (High Sensitivity) 220 (HH) <18 ng/L    Comment: CRITICAL VALUE NOTED.  VALUE IS CONSISTENT WITH PREVIOUSLY REPORTED AND CALLED VALUE. (NOTE) Elevated high sensitivity troponin  I (hsTnI) values and significant  changes across serial measurements may suggest ACS but many other  chronic and acute conditions are known to elevate hsTnI results.  Refer to the Links section for chest pain algorithms and additional  guidance. Performed at [facility], [place] [address], [place], [place] [address]    DG Chest Port 1 View  Result Date: 11/28/2019 CLINICAL DATA:  Weakness, nausea and lethargy since last night. EXAM: PORTABLE CHEST 1 VIEW COMPARISON:  Single-view of the chest 11/20/2019 and 09/10/2017. FINDINGS: Scattered punctate calcified granulomata are seen bilaterally, unchanged. Lungs otherwise clear. No  pneumothorax or pleural effusion. Heart size is upper normal. Atherosclerosis is seen. Large hiatal hernia is identified. No acute or focal bony abnormality. IMPRESSION: No acute disease. Hiatal hernia. Electronically Signed   By: Inge Rise M.D.   On: 11/28/2019 12:52    Pending Labs Unresulted Labs (From admission, onward)    Start     Ordered   11/29/19 0500  Magnesium  Tomorrow morning,   R     11/28/19 1748   11/28/19 1230  Urinalysis, Routine w reflex microscopic  Once,   STAT     11/28/19 1229          Vitals/Pain Today's Vitals   11/28/19 1600 11/28/19 1630 11/28/19 1645 11/28/19 1700  BP: 111/75 112/71  101/69  Pulse: (!) 102  (!) 124 71  Resp: 15 (!) 25 (!) 21 (!) 25  Temp:      TempSrc:      SpO2: 96%  98% 98%  Weight:      Height:      PainSc:        Isolation Precautions No active isolations  Medications Medications  rosuvastatin (CRESTOR) tablet 20 mg (has no administration in time range)  ALPRAZolam (XANAX) tablet 0.5 mg (has no administration in time range)  levothyroxine (SYNTHROID) tablet 50 mcg (has no administration in time range)  clopidogrel (PLAVIX) tablet 75 mg (has no administration in time range)  ferrous sulfate tablet 325 mg (has no administration in time range)  vitamin B-12 (CYANOCOBALAMIN) tablet 500 mcg (has no administration in time range)  Soothe XP SOLN 1-2 drop (has no administration in time range)  latanoprost (XALATAN) 0.005 % ophthalmic solution 1 drop (has no administration in time range)  enoxaparin (LOVENOX) injection 40 mg (has no administration in time range)  sodium chloride flush (NS) 0.9 % injection 3 mL (has no administration in time range)  dextrose 5 % solution (has no administration in time range)  acetaminophen (TYLENOL) tablet 650 mg (has no administration in time range)    Or  acetaminophen (TYLENOL) suppository 650 mg (has no administration in time range)  albuterol (PROVENTIL) (2.5 MG/3ML) 0.083% nebulizer  solution 2.5 mg (has no administration in time range)  sodium chloride 0.9 % bolus 500 mL (0 mLs Intravenous Stopped 11/28/19 1611)    Mobility walks with device     Focused Assessments    R Recommendations: See Admitting Provider Note  Report given to:   Additional Notes:

## 2019-11-28 NOTE — H&P (Addendum)
History and Physical    Kenneth [name] [id number] DOB: October 11, 1928 DOA: 11/28/2019  Referring MD/NP/PA: Milton Ferguson, MD PCP: Lavone Orn, MD  Patient coming from: [facility] via EMS  Chief Complaint: Weakness  I have personally briefly reviewed patient's old medical records in [place]   HPI: Kenneth [name] is a 85 y.o. 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  Patient presents from rehab for generalized weakness, nausea, and lethargy started last night.  Patient has had 2 hospitalizations at this month after suffering a STEMI and thereafter readmitted after having a stroke.  History is limited from the patient as he continues to stay that he needs water otherwise he will die.  Patient denies of having a dry mouth and requests water.  ED Course: Upon admission to the emergency department patient was seen to be afebrile, heart rate 71-127, respirations 15-43, blood pressures 98/76 ~124/89, and O2 saturations maintained on room air.  Labs significant for WBC 10.7, hemoglobin 12, sodium 147, BUN 32, creatinine 1.28, glucose 170, troponin II 35, and BNP 1428.2.  COVID-19 screening was negative on 12/18.   Initial EKG gave concern for ST wave elevations and cardiology was consulted.  Patient was given 500 mL bolus of normal saline IV fluids. TRH called to admit.  Review of Systems  Unable to perform ROS: Acuity of condition  Constitutional: Negative for fever.  Musculoskeletal: Negative for falls.  Neurological: Positive for weakness.    Past Medical History:  Diagnosis Date  . Anemia    on iron  . Anxiety   . Arthritis   . Basal cell carcinoma    left ear  . CAD (coronary artery disease)    a. CAD s/p CABG 1993.  [name] Chronic cough   . Chronic rhinitis   . Complication of anesthesia   .  Diabetes ([place])   . Diverticulosis of colon   . GERD (gastroesophageal reflux disease)   . Glaucoma   . H/O hiatal hernia   . Hernia    left side  . History of gout   . History of kidney stones   . HTN (hypertension)   . Hyperlipidemia `  . Melanoma of skin, site unspecified   . PONV (postoperative nausea and vomiting)   . [facility] spotted fever 2-3 yrs ago  . Thyroid disease     Past Surgical History:  Procedure Laterality Date  . COLONOSCOPY WITH PROPOFOL N/A 11/27/2013   Procedure: COLONOSCOPY WITH PROPOFOL;  Surgeon: Garlan Fair, MD;  Location: [facility];  Service: Endoscopy;  Laterality: N/A;  . COLOSTOMY TAKEDOWN N/A 03/16/2016   Procedure: COLOSTOMY TAKEDOWN;  Surgeon: Excell Seltzer, MD;  Location: [facility];  Service: General;  Laterality: N/A;  . CORONARY ARTERY BYPASS GRAFT  1990's   x 3  . CORONARY/GRAFT ACUTE MI REVASCULARIZATION N/A 11/12/2019   Procedure: CORONARY/GRAFT ACUTE MI REVASCULARIZATION;  Surgeon: Troy Sine, MD;  Location: [place] [facility];  Service: Cardiovascular;  Laterality: N/A;  . HERNIA REPAIR  1970's,15   lft ing hernia  . INGUINAL HERNIA REPAIR Left 06/28/2014   Procedure: Left  INGUINAL HERNIA REPAIR ;  Surgeon: Merrie Roof, MD;  Location: [facility];  Service: General;  Laterality: Left;  . INSERTION OF MESH Left 06/28/2014   Procedure: INSERTION OF MESH;  Surgeon:  [name] III, MD;  Location: [facility];  Service: General;  Laterality: Left;  . INSERTION OF MESH N/A 10/11/2014  . IR CT HEAD LTD  11/19/2019  . IR PERCUTANEOUS ART THROMBECTOMY/INFUSION INTRACRANIAL INC DIAG ANGIO  11/19/2019  . LAPAROSCOPY  10/11/2014  . LAPAROTOMY N/A 09/11/2015   Procedure: EXPLORATORY LAPAROTOMY;  Surgeon: Excell Seltzer, MD;  Location: [facility];  Service: General;  Laterality: N/A;  . MASS EXCISION Left 03/21/2017   Procedure: EXCISION BASAL CELL CARCINOMA LEFT EAR;  Surgeon: Leta Baptist, MD;  Location: [place];  Service: ENT;   Laterality: Left;  . PARTIAL COLECTOMY N/A 09/11/2015   Procedure: SIGMOID COLECTOMY AND COLOSTOMY;  Surgeon: Excell Seltzer, MD;  Location[facility];  Service: General;  Laterality: N/A;  . RADIOLOGY WITH ANESTHESIA N/A 11/19/2019   Procedure: RADIOLOGY WITH ANESTHESIA;  Surgeon: Luanne Bras, MD;  Location: [place];  Service: Radiology;  Laterality: N/A;  . right leg fracture age 52    . SKIN FULL THICKNESS GRAFT Left 03/21/2017   Procedure: FULL THICKNESS SKIN GRAFT TO LEFT EAR;  Surgeon: Leta Baptist, MD;  Location: [place];  Service: ENT;  Laterality: Left;  . TONSILLECTOMY  age 65 or 74  . VENTRAL HERNIA REPAIR  10/11/2014   Procedure: HERNIA REPAIR VENTRAL ADULT;  Surgeon: Autumn Messing III, MD;  Location: [place];  Service: General;;     reports that he has quit smoking. His smoking use included cigars. He has never used smokeless tobacco. He reports current alcohol use. He reports that he does not use drugs.  Allergies  Allergen Reactions  . Lasix [Furosemide] Other (See Comments)    aches  . Spironolactone Other (See Comments)    Red, puffy, eyes, almost hospitalized  . Aspirin Other (See Comments)    Bleeding.    . Lipitor [Atorvastatin] Other (See Comments)    tired  . Lopressor [Metoprolol Tartrate] Other (See Comments)    Sleepy all the time 11.27.2020 Patient is currently taking Lopressor.  [name] Penicillins Hives and Rash    Has patient had a PCN reaction causing immediate rash, facial/tongue/throat swelling, SOB or lightheadedness with hypotension: No Has patient had a PCN reaction causing severe rash involving mucus membranes or skin necrosis: No Has patient had a PCN reaction that required hospitalization No Has patient had a PCN reaction occurring within the last 10 years: Yes If all of the above answers are "NO", then may proceed with Cephalosporin use.     Family History  Problem Relation Age of Onset  . CAD Father   . Hyperlipidemia Father   .  Cancer Mother     Prior to Admission medications   Medication Sig Start Date End Date Taking? Authorizing Provider  acetaminophen (TYLENOL) 325 MG tablet Take 325 mg by mouth every 6 (six) hours as needed for mild pain.     [provider]  ALPRAZolam Duanne Moron) 0.5 MG tablet Take 1 tablet (0.5 mg total) by mouth 3 (three) times daily as needed for anxiety. 11/24/19   Rinehuls, Early Chars, PA-C  Artificial Tear Solution (SOOTHE XP OP) Apply 1 drop to eye as needed (dry eyes).     [provider]  clopidogrel (PLAVIX) 75 MG tablet Take 1 tablet (75 mg total) by mouth daily. 11/24/19   Rinehuls, Early Chars, PA-C  Cyanocobalamin (VITAMIN B-12 PO) Take 1 tablet by mouth daily.    [provider]  ferrous sulfate 325 (65 FE) MG tablet Take  325 mg by mouth daily with breakfast.    [provider]  isosorbide mononitrate (IMDUR) 60 MG 24 hr tablet Take 60 mg by mouth every evening.     [provider]  latanoprost (XALATAN) 0.005 % ophthalmic solution Place 1 drop into both eyes at bedtime.    [provider]  levothyroxine (SYNTHROID) 50 MCG tablet Take 1 tablet (50 mcg total) by mouth daily at 6 (six) AM. 11/25/19   Rinehuls, Early Chars, PA-C  losartan (COZAAR) 25 MG tablet Take 0.5 tablets (12.5 mg total) by mouth daily. 11/16/19   Cheryln Manly, NP  metoprolol succinate (TOPROL-XL) 25 MG 24 hr tablet Take 0.5 tablets (12.5 mg total) by mouth daily. 11/24/19   Rinehuls, Early Chars, PA-C  Multiple Vitamin (MULTIVITAMIN WITH MINERALS) TABS tablet Take 1 tablet by mouth daily.    [provider]  nitroGLYCERIN (NITROSTAT) 0.4 MG SL tablet Place 1 tablet (0.4 mg total) under the tongue every 5 (five) minutes as needed. 11/16/19   Cheryln Manly, NP  ondansetron (ZOFRAN) 4 MG tablet Take 1 tablet (4 mg total) by mouth every 6 (six) hours as needed for nausea. 11/04/19   Alma Friendly, MD  pantoprazole (PROTONIX) 40 MG tablet Take 1 tablet (40  mg total) by mouth 2 (two) times daily. 11/24/19   Rinehuls, Early Chars, PA-C  potassium chloride (K-DUR,KLOR-CON) 10 MEQ tablet Take 10 mEq by mouth every Monday, Wednesday, and Friday.  06/27/18   [provider]  rosuvastatin (CRESTOR) 20 MG tablet Take 1 tablet (20 mg total) by mouth daily at 6 PM. 11/16/19   Cheryln Manly, NP    Physical Exam:  Constitutional: Thin and frail elderly male who appears to be in discomfort Vitals:   11/28/19 1445 11/28/19 1500 11/28/19 1530 11/28/19 1600  BP:  98/76 117/83 111/75  Pulse: (!) 124 (!) 117 (!) 125 (!) 102  Resp: 20 (!) 28 16 15   Temp:      TempSrc:      SpO2: 97% 98% 95% 96%  Weight:      Height:       Eyes: PERRL, lids and conjunctivae normal ENMT: Mucous membranes are dry. Posterior pharynx clear of any exudate or lesions.  Neck: normal, supple, no masses, no thyromegaly Respiratory: clear to auscultation bilaterally, no wheezing, no crackles. Normal respiratory effort. No accessory muscle use.  Cardiovascular: Tachycardic, no murmurs / rubs / gallops. No extremity edema. 2+ pedal pulses. No carotid bruits.  Abdomen: no tenderness, no masses palpated. No hepatosplenomegaly. Bowel sounds positive.  Musculoskeletal: no clubbing / cyanosis. No joint deformity upper and lower extremities. Good ROM, no contractures. Normal muscle tone.  Skin: no rashes, lesions, ulcers. No induration.  Poor skin turgor. Neurologic: CN 2-12 grossly intact. Sensation intact, DTR normal. Strength 5/5 in all 4.  Psychiatric: Normal judgment and insight. Alert and oriented x person and place. Normal mood.     Labs on Admission: I have personally reviewed following labs and imaging studies  CBC: Recent Labs  Lab 11/22/19 0243 11/23/19 0224 11/24/19 0301 11/28/19 1320  WBC 8.8 7.5 8.1 10.7*  NEUTROABS  --   --   --  8.7*  HGB 8.3* 9.2* 9.2* 12.0*  HCT 26.5* 30.0* 30.2* 39.2  MCV 100.8* 100.0 101.0* 99.5  PLT 508* 468* 451* 0000000   Basic  Metabolic Panel: Recent Labs  Lab 11/22/19 0243 11/23/19 0224 11/24/19 0301 11/28/19 1320  NA 141 141 139 147*  K 3.9  3.4* 3.9 3.5  CL 109 109 110 109  CO2 23 21* 20* 18*  GLUCOSE 149* 153* 126* 170*  BUN 22 20 14  32*  CREATININE 0.89 0.79 0.75 1.28*  CALCIUM 8.4* 8.4* 8.2* 9.1   GFR: Estimated Creatinine Clearance: 35.1 mL/min (A) (by C-G formula based on SCr of 1.28 mg/dL (H)). Liver Function Tests: Recent Labs  Lab 11/28/19 1320  AST 16  ALT 14  ALKPHOS 82  BILITOT 1.2  PROT 6.5  ALBUMIN 2.9*   No results for input(s): LIPASE, AMYLASE in the last 168 hours. No results for input(s): AMMONIA in the last 168 hours. Coagulation Profile: No results for input(s): INR, PROTIME in the last 168 hours. Cardiac Enzymes: No results for input(s): CKTOTAL, CKMB, CKMBINDEX, TROPONINI in the last 168 hours. BNP (last 3 results) No results for input(s): PROBNP in the last 8760 hours. HbA1C: No results for input(s): HGBA1C in the last 72 hours. CBG: Recent Labs  Lab 11/23/19 0638 11/23/19 1242 11/23/19 1633 11/23/19 2141 11/24/19 0621  GLUCAP 125* 152* 95 120* 105*   Lipid Profile: No results for input(s): CHOL, HDL, LDLCALC, TRIG, CHOLHDL, LDLDIRECT in the last 72 hours. Thyroid Function Tests: No results for input(s): TSH, T4TOTAL, FREET4, T3FREE, THYROIDAB in the last 72 hours. Anemia Panel: No results for input(s): VITAMINB12, FOLATE, FERRITIN, TIBC, IRON, RETICCTPCT in the last 72 hours. Urine analysis:    Component Value Date/Time   COLORURINE YELLOW 11/19/2019 0900   APPEARANCEUR CLEAR 11/19/2019 0900   LABSPEC >1.046 (H) 11/19/2019 0900   PHURINE 5.0 11/19/2019 0900   GLUCOSEU NEGATIVE 11/19/2019 0900   HGBUR NEGATIVE 11/19/2019 0900   BILIRUBINUR NEGATIVE 11/19/2019 0900   KETONESUR 20 (A) 11/19/2019 0900   PROTEINUR NEGATIVE 11/19/2019 0900   UROBILINOGEN 1.0 09/09/2015 1301   NITRITE NEGATIVE 11/19/2019 0900   LEUKOCYTESUR NEGATIVE 11/19/2019 0900    Sepsis Labs: Recent Results (from the past 240 hour(s))  Respiratory Panel by RT PCR (Flu A&B, Covid) - Nasopharyngeal Swab     Status: None   Collection Time: 11/19/19  5:29 AM   Specimen: Nasopharyngeal Swab  Result Value Ref Range Status   SARS Coronavirus 2 by RT PCR NEGATIVE NEGATIVE Final    Comment: (NOTE) SARS-CoV-2 target nucleic acids are NOT DETECTED. The SARS-CoV-2 RNA is generally detectable in upper respiratoy specimens during the acute phase of infection. The lowest concentration of SARS-CoV-2 viral copies this assay can detect is 131 copies/mL. A negative result does not preclude SARS-Cov-2 infection and should not be used as the sole basis for treatment or other patient management decisions. A negative result may occur with  improper specimen collection/handling, submission of specimen other than nasopharyngeal swab, presence of viral mutation(s) within the areas targeted by this assay, and inadequate number of viral copies (<131 copies/mL). A negative result must be combined with clinical observations, patient history, and epidemiological information. The expected result is Negative. Fact Sheet for Patients:  PinkCheek.be Fact Sheet for Healthcare Providers:  GravelBags.it This test is not yet ap proved or cleared by the [place] FDA and  has been authorized for detection and/or diagnosis of SARS-CoV-2 by FDA under an Emergency Use Authorization (EUA). This EUA will remain  in effect (meaning this test can be used) for the duration of the COVID-19 declaration under Section 564(b)(1) of the Act, 21 U.S.C. section 360bbb-3(b)(1), unless the authorization is terminated or revoked sooner.    Influenza A by PCR NEGATIVE NEGATIVE Final   Influenza B by PCR NEGATIVE  NEGATIVE Final    Comment: (NOTE) The Xpert Xpress SARS-CoV-2/FLU/RSV assay is intended as an aid in  the diagnosis of influenza from  Nasopharyngeal swab specimens and  should not be used as a sole basis for treatment. Nasal washings and  aspirates are unacceptable for Xpert Xpress SARS-CoV-2/FLU/RSV  testing. Fact Sheet for Patients: PinkCheek.be Fact Sheet for Healthcare Providers: GravelBags.it This test is not yet approved or cleared by the [place] FDA and  has been authorized for detection and/or diagnosis of SARS-CoV-2 by  FDA under an Emergency Use Authorization (EUA). This EUA will remain  in effect (meaning this test can be used) for the duration of the  Covid-19 declaration under Section 564(b)(1) of the Act, 21  U.S.C. section 360bbb-3(b)(1), unless the authorization is  terminated or revoked. Performed at [facility], [place] [address], [place], [place] [address]   MRSA PCR Screening     Status: None   Collection Time: 11/19/19  8:57 AM   Specimen: Nasal Mucosa; Nasopharyngeal  Result Value Ref Range Status   MRSA by PCR NEGATIVE NEGATIVE Final    Comment:        The GeneXpert MRSA Assay (FDA approved for NASAL specimens only), is one component of a comprehensive MRSA colonization surveillance program. It is not intended to diagnose MRSA infection nor to guide or monitor treatment for MRSA infections. Performed at [facility], [place] [address] [place], [place] [address]   C difficile quick scan w PCR reflex     Status: None   Collection Time: 11/23/19  6:10 AM   Specimen: STOOL  Result Value Ref Range Status   C Diff antigen NEGATIVE NEGATIVE Final   C Diff toxin NEGATIVE NEGATIVE Final   C Diff interpretation No C. difficile detected.  Final    Comment: Performed at [facility], [place] [address] [place], [place] [address]  Respiratory Panel by RT PCR (Flu A&B, Covid) -     Status: None   Collection Time: 11/23/19  1:18 PM  Result Value Ref Range Status   SARS Coronavirus 2 by RT PCR NEGATIVE NEGATIVE Final     Comment: (NOTE) SARS-CoV-2 target nucleic acids are NOT DETECTED. The SARS-CoV-2 RNA is generally detectable in upper respiratoy specimens during the acute phase of infection. The lowest concentration of SARS-CoV-2 viral copies this assay can detect is 131 copies/mL. A negative result does not preclude SARS-Cov-2 infection and should not be used as the sole basis for treatment or other patient management decisions. A negative result may occur with  improper specimen collection/handling, submission of specimen other than nasopharyngeal swab, presence of viral mutation(s) within the areas targeted by this assay, and inadequate number of viral copies (<131 copies/mL). A negative result must be combined with clinical observations, patient history, and epidemiological information. The expected result is Negative. Fact Sheet for Patients:  PinkCheek.be Fact Sheet for Healthcare Providers:  GravelBags.it This test is not yet ap proved or cleared by the [place] FDA and  has been authorized for detection and/or diagnosis of SARS-CoV-2 by FDA under an Emergency Use Authorization (EUA). This EUA will remain  in effect (meaning this test can be used) for the duration of the COVID-19 declaration under Section 564(b)(1) of the Act, 21 U.S.C. section 360bbb-3(b)(1), unless the authorization is terminated or revoked sooner.    Influenza A by PCR NEGATIVE NEGATIVE Final   Influenza B by PCR NEGATIVE NEGATIVE Final    Comment: (NOTE) The Xpert  Xpress SARS-CoV-2/FLU/RSV assay is intended as an aid in  the diagnosis of influenza from Nasopharyngeal swab specimens and  should not be used as a sole basis for treatment. Nasal washings and  aspirates are unacceptable for Xpert Xpress SARS-CoV-2/FLU/RSV  testing. Fact Sheet for Patients: PinkCheek.be Fact Sheet for Healthcare  Providers: GravelBags.it This test is not yet approved or cleared by the [place] FDA and  has been authorized for detection and/or diagnosis of SARS-CoV-2 by  FDA under an Emergency Use Authorization (EUA). This EUA will remain  in effect (meaning this test can be used) for the duration of the  Covid-19 declaration under Section 564(b)(1) of the Act, 21  U.S.C. section 360bbb-3(b)(1), unless the authorization is  terminated or revoked. Performed at [facility], [place] [address] [place],  [address]      Radiological Exams on Admission: DG Chest Port 1 View  Result Date: 11/28/2019 CLINICAL DATA:  Weakness, nausea and lethargy since last night. EXAM: PORTABLE CHEST 1 VIEW COMPARISON:  Single-view of the chest 11/20/2019 and 09/10/2017. FINDINGS: Scattered punctate calcified granulomata are seen bilaterally, unchanged. Lungs otherwise clear. No pneumothorax or pleural effusion. Heart size is upper normal. Atherosclerosis is seen. Large hiatal hernia is identified. No acute or focal bony abnormality. IMPRESSION: No acute disease. Hiatal hernia. Electronically Signed   By: Inge Rise M.D.   On: 11/28/2019 12:52    EKG: Independently reviewed.  Sinus tachycardia 126 bpm with QTc 538  Assessment/Plan Generalized weakness secondary to dehydration: Patient presents with generalized weakness after recently being discharged from the hospital.  He appears acutely dehydrated based on poor skin turgor on physical exam in addition to lab work. -Admit to a medical telemetry bed  -PT/OT to evaluate and treat  Acute kidney injury: Patient's creatinine noted to be 1.28, but previously was 0.75 at discharge on the 19th.  Suspect prerenal cause given elevated BUN to creatinine ratio -Strict intake and output -Gentle IV fluids D5 W at 50 mL/h overnight -Recheck BMP in a.m.  CAD, elevated troponin: Patient just recently hospitalized with a STEMI  requiring PCI with DES to the SVG-PDA on 12/7.  Troponins 903 on 12/15, but repeat today 235->230 which appear to be tracking down appropriately.  EKG without any significant changes and patient not complaining of any chest pain. -Continue Plavix  Hypernatremia: Acute.  On admission sodium 147.  Given patient's acute kidney injury was suspect patient likely is dehydrated. -Continue IV fluids as seen above -Recheck sodium levels in a.m.  Prolonged QT interval: Acute.  QTC elevated at 538. -Recheck EKG in a.m. -Hold QT prolonging medication  Systolic congestive heart failure: Patient appears to be hypovolemic at this time.  EF noted to be 20-25% on 12/7.  BMP initially had been 2174. -Strict intake and output -Daily weight -Hold furosemide due to AKI  Essential hypertension: On admission patient's blood pressures were noted to be soft 90/72.  It appears after being discharged from the hospital last there were several changes made including discontinuation of 12.5 mg of metoprolol succinate and losartan changed to 25 mg twice daily. -Held all blood pressure medications due to potential soft pressures, restart when medically appropriate  Leukocytosis: WBC mildly elevated at 10.7.  Patient is otherwise afebrile and chest x-ray did not appear to show any acute abnormalities. -Follow-up urinalysis -Recheck CBC in a.m.  Recent CVA: Patient was recently hospitalized for distal basal artery occlusion with thready flow to the right PCA requiring interventional radiology for thrombectomy. -  Continue Plavix  -Dysphagia 3 diet -Aspiration precautions  Normocytic anemia: Hemoglobin 12 which appears near patient's baseline.  No reports of bleeding. -Continue to monitor  Anxiety -Held Lexapro due to prolonged QT  -Continue Xanax as needed  Hypothyroidism: TSH 2.833 on 11/27. -Continue levothyroxine  Hyperlipidemia -Continue Crestor  DNR: Present on admission  DVT prophylaxis: Lovenox Code  Status: DNR Family Communication: No family present at bedside Disposition Plan: Likely discharge back to rehab when medically appropriate Consults called: Cardiology Admission status: Observation  Norval Morton MD [facility] Pager [phone]   If 7PM-7AM, please contact night-coverage [email or web address] Password [facility]  11/28/2019, 4:43 PM

## 2019-11-28 NOTE — ED Triage Notes (Signed)
Pt arrived via [facility] from [facility] for generalized weakness, nausea and lethargy that started last night. Pt had a recent MI and recently had 2 strokes. Per EMS 12 lead showed ST elevation in V4, v5, and V6r, STEMI canceled by cardiology. Pt is A&Ox2 to self and place. Pt is sinus tach on monitor. VS WNL.

## 2019-11-29 ENCOUNTER — Other Ambulatory Visit (HOSPITAL_COMMUNITY): Payer: Medicare Other

## 2019-11-29 ENCOUNTER — Inpatient Hospital Stay (HOSPITAL_COMMUNITY): Payer: Medicare Other

## 2019-11-29 ENCOUNTER — Encounter (HOSPITAL_COMMUNITY): Payer: Self-pay | Admitting: Internal Medicine

## 2019-11-29 ENCOUNTER — Other Ambulatory Visit: Payer: Self-pay

## 2019-11-29 ENCOUNTER — Observation Stay (HOSPITAL_COMMUNITY): Payer: Medicare Other

## 2019-11-29 DIAGNOSIS — Z85820 Personal history of malignant melanoma of skin: Secondary | ICD-10-CM | POA: Diagnosis not present

## 2019-11-29 DIAGNOSIS — E860 Dehydration: Secondary | ICD-10-CM | POA: Diagnosis present

## 2019-11-29 DIAGNOSIS — I130 Hypertensive heart and chronic kidney disease with heart failure and stage 1 through stage 4 chronic kidney disease, or unspecified chronic kidney disease: Secondary | ICD-10-CM | POA: Diagnosis present

## 2019-11-29 DIAGNOSIS — E43 Unspecified severe protein-calorie malnutrition: Secondary | ICD-10-CM | POA: Diagnosis present

## 2019-11-29 DIAGNOSIS — I2119 ST elevation (STEMI) myocardial infarction involving other coronary artery of inferior wall: Secondary | ICD-10-CM | POA: Diagnosis present

## 2019-11-29 DIAGNOSIS — Z8673 Personal history of transient ischemic attack (TIA), and cerebral infarction without residual deficits: Secondary | ICD-10-CM | POA: Diagnosis not present

## 2019-11-29 DIAGNOSIS — Z515 Encounter for palliative care: Secondary | ICD-10-CM | POA: Diagnosis not present

## 2019-11-29 DIAGNOSIS — Z7902 Long term (current) use of antithrombotics/antiplatelets: Secondary | ICD-10-CM | POA: Diagnosis not present

## 2019-11-29 DIAGNOSIS — F419 Anxiety disorder, unspecified: Secondary | ICD-10-CM | POA: Diagnosis present

## 2019-11-29 DIAGNOSIS — N179 Acute kidney failure, unspecified: Secondary | ICD-10-CM | POA: Diagnosis present

## 2019-11-29 DIAGNOSIS — N189 Chronic kidney disease, unspecified: Secondary | ICD-10-CM | POA: Diagnosis not present

## 2019-11-29 DIAGNOSIS — I2510 Atherosclerotic heart disease of native coronary artery without angina pectoris: Secondary | ICD-10-CM | POA: Diagnosis present

## 2019-11-29 DIAGNOSIS — K219 Gastro-esophageal reflux disease without esophagitis: Secondary | ICD-10-CM | POA: Diagnosis present

## 2019-11-29 DIAGNOSIS — I5042 Chronic combined systolic (congestive) and diastolic (congestive) heart failure: Secondary | ICD-10-CM | POA: Diagnosis present

## 2019-11-29 DIAGNOSIS — Z66 Do not resuscitate: Secondary | ICD-10-CM | POA: Diagnosis present

## 2019-11-29 DIAGNOSIS — I252 Old myocardial infarction: Secondary | ICD-10-CM | POA: Diagnosis not present

## 2019-11-29 DIAGNOSIS — Z20828 Contact with and (suspected) exposure to other viral communicable diseases: Secondary | ICD-10-CM | POA: Diagnosis present

## 2019-11-29 DIAGNOSIS — E039 Hypothyroidism, unspecified: Secondary | ICD-10-CM | POA: Diagnosis present

## 2019-11-29 DIAGNOSIS — Z8249 Family history of ischemic heart disease and other diseases of the circulatory system: Secondary | ICD-10-CM | POA: Diagnosis not present

## 2019-11-29 DIAGNOSIS — Z955 Presence of coronary angioplasty implant and graft: Secondary | ICD-10-CM | POA: Diagnosis not present

## 2019-11-29 DIAGNOSIS — I2699 Other pulmonary embolism without acute cor pulmonale: Secondary | ICD-10-CM | POA: Diagnosis present

## 2019-11-29 DIAGNOSIS — E1122 Type 2 diabetes mellitus with diabetic chronic kidney disease: Secondary | ICD-10-CM | POA: Diagnosis present

## 2019-11-29 DIAGNOSIS — I25810 Atherosclerosis of coronary artery bypass graft(s) without angina pectoris: Secondary | ICD-10-CM | POA: Diagnosis present

## 2019-11-29 DIAGNOSIS — E785 Hyperlipidemia, unspecified: Secondary | ICD-10-CM | POA: Diagnosis present

## 2019-11-29 DIAGNOSIS — R627 Adult failure to thrive: Secondary | ICD-10-CM | POA: Diagnosis not present

## 2019-11-29 DIAGNOSIS — E870 Hyperosmolality and hypernatremia: Secondary | ICD-10-CM | POA: Diagnosis present

## 2019-11-29 LAB — GLUCOSE, CAPILLARY: Glucose-Capillary: 159 mg/dL — ABNORMAL HIGH (ref 70–99)

## 2019-11-29 LAB — CBC WITH DIFFERENTIAL/PLATELET
Abs Immature Granulocytes: 0.1 10*3/uL — ABNORMAL HIGH (ref 0.00–0.07)
Basophils Absolute: 0 10*3/uL (ref 0.0–0.1)
Basophils Relative: 0 %
Eosinophils Absolute: 0 10*3/uL (ref 0.0–0.5)
Eosinophils Relative: 0 %
HCT: 39 % (ref 39.0–52.0)
Hemoglobin: 11.8 g/dL — ABNORMAL LOW (ref 13.0–17.0)
Immature Granulocytes: 1 %
Lymphocytes Relative: 9 %
Lymphs Abs: 1.1 10*3/uL (ref 0.7–4.0)
MCH: 29.8 pg (ref 26.0–34.0)
MCHC: 30.3 g/dL (ref 30.0–36.0)
MCV: 98.5 fL (ref 80.0–100.0)
Monocytes Absolute: 1.3 10*3/uL — ABNORMAL HIGH (ref 0.1–1.0)
Monocytes Relative: 10 %
Neutro Abs: 9.9 10*3/uL — ABNORMAL HIGH (ref 1.7–7.7)
Neutrophils Relative %: 80 %
Platelets: 370 10*3/uL (ref 150–400)
RBC: 3.96 MIL/uL — ABNORMAL LOW (ref 4.22–5.81)
RDW: 14.4 % (ref 11.5–15.5)
WBC: 12.3 10*3/uL — ABNORMAL HIGH (ref 4.0–10.5)
nRBC: 0 % (ref 0.0–0.2)

## 2019-11-29 LAB — URINALYSIS, ROUTINE W REFLEX MICROSCOPIC
Bacteria, UA: NONE SEEN
Bilirubin Urine: NEGATIVE
Glucose, UA: NEGATIVE mg/dL
Hgb urine dipstick: NEGATIVE
Ketones, ur: 80 mg/dL — AB
Leukocytes,Ua: NEGATIVE
Nitrite: NEGATIVE
Protein, ur: 30 mg/dL — AB
Specific Gravity, Urine: 1.026 (ref 1.005–1.030)
pH: 5 (ref 5.0–8.0)

## 2019-11-29 LAB — BASIC METABOLIC PANEL
Anion gap: 17 — ABNORMAL HIGH (ref 5–15)
Anion gap: 15 (ref 5–15)
BUN: 24 mg/dL — ABNORMAL HIGH (ref 8–23)
BUN: 23 mg/dL (ref 8–23)
CO2: 21 mmol/L — ABNORMAL LOW (ref 22–32)
CO2: 23 mmol/L (ref 22–32)
Calcium: 9 mg/dL (ref 8.9–10.3)
Calcium: 8.9 mg/dL (ref 8.9–10.3)
Chloride: 106 mmol/L (ref 98–111)
Chloride: 104 mmol/L (ref 98–111)
Creatinine, Ser: 1.46 mg/dL — ABNORMAL HIGH (ref 0.61–1.24)
Creatinine, Ser: 0.97 mg/dL (ref 0.61–1.24)
GFR calc Af Amer: 49 mL/min — ABNORMAL LOW (ref >60)
GFR calc Af Amer: >60 mL/min (ref >60)
GFR calc non Af Amer: 42 mL/min — ABNORMAL LOW (ref >60)
GFR calc non Af Amer: >60 mL/min (ref >60)
Glucose, Bld: 173 mg/dL — ABNORMAL HIGH (ref 70–99)
Glucose, Bld: 158 mg/dL — ABNORMAL HIGH (ref 70–99)
Potassium: 3.6 mmol/L (ref 3.5–5.1)
Potassium: 3.2 mmol/L — ABNORMAL LOW (ref 3.5–5.1)
Sodium: 144 mmol/L (ref 135–145)
Sodium: 142 mmol/L (ref 135–145)

## 2019-11-29 LAB — LACTIC ACID, PLASMA: Lactic Acid, Venous: 1.5 mmol/L (ref 0.5–1.9)

## 2019-11-29 LAB — D-DIMER, QUANTITATIVE: D-Dimer, Quant: 2.39 ug/mL-FEU — ABNORMAL HIGH (ref 0.00–0.50)

## 2019-11-29 LAB — MAGNESIUM: Magnesium: 1.8 mg/dL (ref 1.7–2.4)

## 2019-11-29 LAB — TROPONIN I (HIGH SENSITIVITY): Troponin I (High Sensitivity): 197 ng/L (ref <18)

## 2019-11-29 MED ORDER — ALUM & MAG HYDROXIDE-SIMETH 200-200-20 MG/5ML PO SUSP
30.0000 mL | Freq: Once | ORAL | Status: DC
  Filled 2019-11-29: qty 30

## 2019-11-29 MED ORDER — HEPARIN (PORCINE) 25000 UT/250ML-% IV SOLN
1000.0000 [IU]/h | INTRAVENOUS | Status: DC
  Administered 2019-11-29: 900 [IU]/h via INTRAVENOUS
  Administered 2019-11-30: 20:00:00 950 [IU]/h via INTRAVENOUS
  Filled 2019-11-29 (×2): qty 250

## 2019-11-29 MED ORDER — IOHEXOL 350 MG/ML SOLN
100.0000 mL | Freq: Once | INTRAVENOUS | Status: AC | PRN
  Administered 2019-11-29: 100 mL via INTRAVENOUS

## 2019-11-29 MED ORDER — PROCHLORPERAZINE EDISYLATE 10 MG/2ML IJ SOLN
5.0000 mg | Freq: Once | INTRAMUSCULAR | Status: AC
  Administered 2019-11-29: 5 mg via INTRAVENOUS
  Filled 2019-11-29: qty 1

## 2019-11-29 MED ORDER — HEPARIN BOLUS VIA INFUSION
1600.0000 [IU] | Freq: Once | INTRAVENOUS | Status: AC
  Administered 2019-11-29: 1600 [IU] via INTRAVENOUS
  Filled 2019-11-29: qty 1600

## 2019-11-29 MED ORDER — METOCLOPRAMIDE HCL 5 MG/ML IJ SOLN
5.0000 mg | Freq: Once | INTRAMUSCULAR | Status: AC
  Administered 2019-11-29: 5 mg via INTRAVENOUS
  Filled 2019-11-29: qty 2

## 2019-11-29 MED ORDER — HYDROCODONE-ACETAMINOPHEN 5-325 MG PO TABS
1.0000 | ORAL_TABLET | Freq: Four times a day (QID) | ORAL | Status: AC | PRN
  Administered 2019-11-29 – 2019-11-30 (×3): 1 via ORAL
  Filled 2019-11-29 (×4): qty 1

## 2019-11-29 MED ORDER — POTASSIUM CHLORIDE 10 MEQ/100ML IV SOLN
10.0000 meq | INTRAVENOUS | Status: AC
  Administered 2019-11-29 (×4): 10 meq via INTRAVENOUS
  Filled 2019-11-29 (×4): qty 100

## 2019-11-29 NOTE — Care Management Obs Status (Signed)
[place] NOTIFICATION   Patient Details  Name: Kenneth [name] MRN: [id number] Date of Birth: 1928/02/12   Medicare Observation Status Notification Given:  Yes    Marilu Favre, RN 11/29/2019, 12:27 PM

## 2019-11-29 NOTE — Progress Notes (Signed)
Notified the provider on-call about patient's CBG result and request of CBG order. Pt verbalized that he takes Metformin prior to hospitalization.

## 2019-11-29 NOTE — Progress Notes (Signed)
PROGRESS NOTE    Kenneth [name]  [id number] DOB: 08-20-1928 DOA: 11/28/2019 PCP: Lavone Orn, MD   Brief Narrative:  HPI: Kenneth [name] is a 85 y.o. 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  Patient presents from rehab for generalized weakness, nausea, and lethargy started last night.  Patient has had 2 hospitalizations at this month after suffering a STEMI and thereafter readmitted after having a stroke.  History is limited from the patient as he continues to stay that he needs water otherwise he will die.  Patient denies of having a dry mouth and requests water.  ED Course: Upon admission to the emergency department patient was seen to be afebrile, heart rate 71-127, respirations 15-43, blood pressures 98/76 ~124/89, and O2 saturations maintained on room air.  Labs significant for WBC 10.7, hemoglobin 12, sodium 147, BUN 32, creatinine 1.28, glucose 170, troponin II 35, and BNP 1428.2.  COVID-19 screening was negative on 12/18.   Initial EKG gave concern for ST wave elevations and cardiology was consulted.  Patient was given 500 mL bolus of normal saline IV fluids. TRH called to admit.  Assessment & Plan:   Active Problems:   HTN (hypertension)   Coronary artery disease involving coronary bypass graft of native heart without angina pectoris   Acute kidney injury (HCC)   Prolonged QT interval   Chronic systolic CHF (congestive heart failure) (HCC)   History of CVA (cerebrovascular accident)   Normocytic anemia   DNR (do not resuscitate)   Generalized weakness   Dehydration   Acute kidney injury superimposed on chronic kidney disease (HCC)   Generalized weakness secondary to dehydration: Patient presents with generalized weakness after recently being discharged from the hospital.  He was dehydrated and  he still looks slightly dehydrated.  Continue IV fluids.  PT OT to assess.   Acute kidney injury: Patient's creatinine noted to be 1.28, but previously was 0.75 at discharge on the 19th.  Likely due to dehydration.  Now resolved.  CAD, elevated troponin: Patient just recently hospitalized with a STEMI requiring PCI with DES to the SVG-PDA on 12/7.  Troponins 903 on 12/15, but repeat today 235->230 which appear to be tracking down appropriately.  EKG without any significant changes and patient not complaining of any chest pain.  Seen by cardiology and they are not concerned about this.  When I saw him this morning, he did not have any chest pain.  However I was called later by nurse that he was having some chest pain.  EKG repeated did not show any changes.  We will pursue 2 more troponins.  Was trying to order transthoracic echo but that was canceled by cardiology.  Abdominal tenderness: He was not able to provide much history this morning.  I was concerned about ileus.  Obtained x-ray abdomen which ruled out any obstruction but it shows nephrolithiasis.  Due to his overall sick looking clinical scenario, I will proceed with CT abdomen and pelvis to rule out any other unknown intra-abdominal pathology which could be the cause of his current scenario.  Elevated D-dimer/ ?  Chest pain earlier: D-dimer elevated.  Now that his renal function is back to normal.  Will proceed with CT angiogram of the chest to rule out PE.  Hypernatremia: Acute but mild and resolved.  Prolonged QT interval: Acute.  QTC elevated at 538. -Recheck EKG in a.m. -Hold QT prolonging medication  Systolic congestive heart failure: Patient appears to be hypovolemic at this time.  EF noted to be 20-25% on 12/7.  BMP initially had been 2174.  Seen by cardiology.  No concerns expressed.  Looks dry.  Holding furosemide due to AKI.  Essential hypertension: On admission patient's blood pressures were noted to be soft 90/72.  It  appears after being discharged from the hospital last there were several changes made including discontinuation of 12.5 mg of metoprolol succinate and losartan changed to 25 mg twice daily.  His blood pressure is much better now.  Continue to hold his home medications which are Imdur, metoprolol, torsemide and losartan.  -Held all blood pressure medications due to potential soft pressures, restart when medically appropriate  Leukocytosis: WBC mildly elevated at 10.7, further increased to 12.3 today.  No clear indication of infection.  Pursuing CT abdomen.  UA negative.  Recent CVA: Patient was recently hospitalized for distal basal artery occlusion with thready flow to the right PCA requiring interventional radiology for thrombectomy. -Continue Plavix  -Dysphagia 3 diet -Aspiration precautions  Normocytic anemia: Hemoglobin stable.  Anxiety -Held Lexapro due to prolonged QT  -Continue Xanax as needed  Hypothyroidism: TSH 2.833 on 11/27. -Continue levothyroxine  Hyperlipidemia -Continue Crestor   DVT prophylaxis: Lovenox Code Status: DNR present on admission Family Communication:  None present at bedside.  Daughter came to the bedside later.  I called and discussed with her.  Answered several questions.  Updated her about current management and further plan and investigation. Disposition Plan: To be determined.  Estimated body mass index is 22.55 kg/m as calculated from the following:   Height as of this encounter: 5\' 6"  (1.676 m).   Weight as of this encounter: 63.4 kg.      Nutritional status:               Consultants:   Cardiology  Procedures:   None  Antimicrobials:   None   Subjective: Seen and examined earlier.  He was groggy and looked lethargic.  Complained of some abdominal pain.  Objective: Vitals:   11/28/19 1944 11/29/19 0426 11/29/19 0831 11/29/19 1014  BP: 116/78 104/65 132/75 118/61  Pulse: 89 96 91 94  Resp: 18 16 18 18    Temp: 97.7 F (36.5 C) 97.8 F (36.6 C) (!) 97.5 F (36.4 C) 98.1 F (36.7 C)  TempSrc: Oral  Oral Oral  SpO2: 99% 100%  98%  Weight:  63.4 kg    Height:        Intake/Output Summary (Last 24 hours) at 11/29/2019 1441 Last data filed at 11/29/2019 0300 Gross per 24 hour  Intake 726.11 ml  Output 200 ml  Net 526.11 ml   Filed Weights   11/28/19 1238 11/29/19 0426  Weight: 63.5 kg 63.4 kg    Examination:  General exam: Appears groggy and dehydrated Respiratory system: Clear to auscultation. Respiratory effort normal. Cardiovascular system: S1 & S2 heard, RRR. No JVD, murmurs, rubs, gallops or clicks. No pedal edema. Gastrointestinal system: Abdomen is nondistended, soft and generalized abdominal tenderness. No organomegaly or masses felt. Normal bowel sounds heard. Central nervous system: Groggy but oriented. No focal neurological deficits. Extremities: Symmetric 5 x 5 power. Skin: No rashes, lesions or ulcers   Data Reviewed: I have personally reviewed following labs and imaging studies  CBC: Recent Labs  Lab 11/23/19 0224 11/24/19 0301 11/28/19 1320 11/29/19 0930  WBC 7.5 8.1 10.7*  12.3*  NEUTROABS  --   --  8.7* 9.9*  HGB 9.2* 9.2* 12.0* 11.8*  HCT 30.0* 30.2* 39.2 39.0  MCV 100.0 101.0* 99.5 98.5  PLT 468* 451* 370 0000000   Basic Metabolic Panel: Recent Labs  Lab 11/23/19 0224 11/24/19 0301 11/28/19 1320 11/29/19 0439 11/29/19 0930  NA 141 139 147*  --  144  K 3.4* 3.9 3.5  --  3.6  CL 109 110 109  --  106  CO2 21* 20* 18*  --  21*  GLUCOSE 153* 126* 170*  --  173*  BUN 20 14 32*  --  24*  CREATININE 0.79 0.75 1.28*  --  1.46*  CALCIUM 8.4* 8.2* 9.1  --  9.0  MG  --   --   --  1.8  --    GFR: Estimated Creatinine Clearance: 30.8 mL/min (A) (by C-G formula based on SCr of 1.46 mg/dL (H)). Liver Function Tests: Recent Labs  Lab 11/28/19 1320  AST 16  ALT 14  ALKPHOS 82  BILITOT 1.2  PROT 6.5  ALBUMIN 2.9*   No results for input(s):  LIPASE, AMYLASE in the last 168 hours. No results for input(s): AMMONIA in the last 168 hours. Coagulation Profile: No results for input(s): INR, PROTIME in the last 168 hours. Cardiac Enzymes: No results for input(s): CKTOTAL, CKMB, CKMBINDEX, TROPONINI in the last 168 hours. BNP (last 3 results) No results for input(s): PROBNP in the last 8760 hours. HbA1C: No results for input(s): HGBA1C in the last 72 hours. CBG: Recent Labs  Lab 11/23/19 1633 11/23/19 2141 11/24/19 0621 11/28/19 2216 11/29/19 0622  GLUCAP 95 120* 105* 138* 159*   Lipid Profile: No results for input(s): CHOL, HDL, LDLCALC, TRIG, CHOLHDL, LDLDIRECT in the last 72 hours. Thyroid Function Tests: No results for input(s): TSH, T4TOTAL, FREET4, T3FREE, THYROIDAB in the last 72 hours. Anemia Panel: No results for input(s): VITAMINB12, FOLATE, FERRITIN, TIBC, IRON, RETICCTPCT in the last 72 hours. Sepsis Labs: Recent Labs  Lab 11/29/19 1316  LATICACIDVEN 1.5    Recent Results (from the past 240 hour(s))  C difficile quick scan w PCR reflex     Status: None   Collection Time: 11/23/19  6:10 AM   Specimen: STOOL  Result Value Ref Range Status   C Diff antigen NEGATIVE NEGATIVE Final   C Diff toxin NEGATIVE NEGATIVE Final   C Diff interpretation No C. difficile detected.  Final    Comment: Performed at [facility], [place] [address], [place], [place] [address]  Respiratory Panel by RT PCR (Flu A&B, Covid) -     Status: None   Collection Time: 11/23/19  1:18 PM  Result Value Ref Range Status   SARS Coronavirus 2 by RT PCR NEGATIVE NEGATIVE Final    Comment: (NOTE) SARS-CoV-2 target nucleic acids are NOT DETECTED. The SARS-CoV-2 RNA is generally detectable in upper respiratoy specimens during the acute phase of infection. The lowest concentration of SARS-CoV-2 viral copies this assay can detect is 131 copies/mL. A negative result does not preclude SARS-Cov-2 infection and should not be used as the sole  basis for treatment or other patient management decisions. A negative result may occur with  improper specimen collection/handling, submission of specimen other than nasopharyngeal swab, presence of viral mutation(s) within the areas targeted by this assay, and inadequate number of viral copies (<131 copies/mL). A negative result must be combined with clinical observations, patient history, and epidemiological information. The expected result is Negative. Fact  Sheet for Patients:  PinkCheek.be Fact Sheet for Healthcare Providers:  GravelBags.it This test is not yet ap proved or cleared by the [place] FDA and  has been authorized for detection and/or diagnosis of SARS-CoV-2 by FDA under an Emergency Use Authorization (EUA). This EUA will remain  in effect (meaning this test can be used) for the duration of the COVID-19 declaration under Section 564(b)(1) of the Act, 21 U.S.C. section 360bbb-3(b)(1), unless the authorization is terminated or revoked sooner.    Influenza A by PCR NEGATIVE NEGATIVE Final   Influenza B by PCR NEGATIVE NEGATIVE Final    Comment: (NOTE) The Xpert Xpress SARS-CoV-2/FLU/RSV assay is intended as an aid in  the diagnosis of influenza from Nasopharyngeal swab specimens and  should not be used as a sole basis for treatment. Nasal washings and  aspirates are unacceptable for Xpert Xpress SARS-CoV-2/FLU/RSV  testing. Fact Sheet for Patients: PinkCheek.be Fact Sheet for Healthcare Providers: GravelBags.it This test is not yet approved or cleared by the [place] FDA and  has been authorized for detection and/or diagnosis of SARS-CoV-2 by  FDA under an Emergency Use Authorization (EUA). This EUA will remain  in effect (meaning this test can be used) for the duration of the  Covid-19 declaration under Section 564(b)(1) of the Act, 21  U.S.C.  section 360bbb-3(b)(1), unless the authorization is  terminated or revoked. Performed at [facility], [place] [address], [place], [place] [address]   SARS CORONAVIRUS 2 (TAT 6-24 HRS) Nasopharyngeal Nasopharyngeal Swab     Status: None   Collection Time: 11/28/19  6:30 PM   Specimen: Nasopharyngeal Swab  Result Value Ref Range Status   SARS Coronavirus 2 NEGATIVE NEGATIVE Final    Comment: (NOTE) SARS-CoV-2 target nucleic acids are NOT DETECTED. The SARS-CoV-2 RNA is generally detectable in upper and lower respiratory specimens during the acute phase of infection. Negative results do not preclude SARS-CoV-2 infection, do not rule out co-infections with other pathogens, and should not be used as the sole basis for treatment or other patient management decisions. Negative results must be combined with clinical observations, patient history, and epidemiological information. The expected result is Negative. Fact Sheet for Patients: SugarRoll.be Fact Sheet for Healthcare Providers: https://www.woods-mathews.com/ This test is not yet approved or cleared by the [place] FDA and  has been authorized for detection and/or diagnosis of SARS-CoV-2 by FDA under an Emergency Use Authorization (EUA). This EUA will remain  in effect (meaning this test can be used) for the duration of the COVID-19 declaration under Section 56 4(b)(1) of the Act, 21 U.S.C. section 360bbb-3(b)(1), unless the authorization is terminated or revoked sooner. Performed at [facility], [place] [address] [place], [place] [address]       Radiology Studies: DG Abd 1 View  Result Date: 11/29/2019 CLINICAL DATA:  Nausea, diarrhea, left upper quadrant pain EXAM: ABDOMEN - 1 VIEW COMPARISON:  CT 11/13/2019 FINDINGS: Nonobstructive bowel gas pattern. No organomegaly or free air. Right lower pole nephrolithiasis. Aortic and vascular calcifications noted. IMPRESSION: No  obstruction or free air. Right lower pole nephrolithiasis. Electronically Signed   By: Rolm Baptise M.D.   On: 11/29/2019 10:45   DG Chest Port 1 View  Result Date: 11/28/2019 CLINICAL DATA:  Weakness, nausea and lethargy since last night. EXAM: PORTABLE CHEST 1 VIEW COMPARISON:  Single-view of the chest 11/20/2019 and 09/10/2017. FINDINGS: Scattered punctate calcified granulomata are seen bilaterally, unchanged. Lungs otherwise clear. No pneumothorax or pleural effusion. Heart size  is upper normal. Atherosclerosis is seen. Large hiatal hernia is identified. No acute or focal bony abnormality. IMPRESSION: No acute disease. Hiatal hernia. Electronically Signed   By: Inge Rise M.D.   On: 11/28/2019 12:52    Scheduled Meds: . alum & mag hydroxide-simeth  30 mL Oral Once  . clopidogrel  75 mg Oral Daily  . enoxaparin (LOVENOX) injection  40 mg Subcutaneous Q24H  . ferrous sulfate  325 mg Oral Q breakfast  . latanoprost  1 drop Both Eyes QHS  . levothyroxine  50 mcg Oral Q0600  . rosuvastatin  20 mg Oral q1800  . sodium chloride flush  3 mL Intravenous Q12H  . vitamin B-12  500 mcg Oral Daily   Continuous Infusions: . dextrose 50 mL/hr at 11/28/19 2051     LOS: 0 days   Time spent: 37 minutes   Darliss Cheney, MD [facility]  11/29/2019, 2:41 PM   To contact the attending provider between 7A-7P or the covering provider during after hours 7P-7A, please log into the web site [email or web address] and use password [email or web address].

## 2019-11-29 NOTE — Progress Notes (Signed)
Urine specimen has been sent to the lab

## 2019-11-29 NOTE — Evaluation (Signed)
Physical Therapy Evaluation Patient Details Name: Kenneth [name] MRN: [id number] DOB: 1928-08-19 Today's Date: 11/29/2019   History of Present Illness  85 y.o. 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  Patient presents from rehab for generalized weakness, nausea, and lethargy started last night.  Clinical Impression  Pt presents to PT with deficits in functional mobility, gait, balance, strength, endurance, power, and cognition. Pt is generally weak, requiring physical assistance for all mobility at this time. Pt perseverating on wanting water but not being able to drink due to nausea. Pt also expressing desire to see his wife, may be demonstrating some signs of depression after multiple hospitalizations and prolong recovery. Pt will benefit from acute PT POC to improve strength and reduce falls risk.    Follow Up Recommendations SNF;Supervision/Assistance - 24 hour    Equipment Recommendations  None recommended by PT(defer to post-acute)    Recommendations for Other Services       Precautions / Restrictions Precautions Precautions: Fall Precaution Comments: HOH Restrictions Weight Bearing Restrictions: No      Mobility  Bed Mobility Overal bed mobility: Needs Assistance Bed Mobility: Supine to Sit     Supine to sit: Max assist;+2 for physical assistance;HOB elevated     General bed mobility comments: increased time and assist to mobilize LEs to EOB, max A for trunk support   Transfers Overall transfer level: Needs assistance Equipment used: 2 person hand held assist Transfers: Sit to/from Stand Sit to Stand: Min assist;+2 physical assistance         General transfer comment: min x2 HHA for sit to stand, cues for body mechanics  Ambulation/Gait Ambulation/Gait assistance: Min assist Gait  Distance (Feet): 3 Feet Assistive device: 2 person hand held assist Gait Pattern/deviations: Step-to pattern;Shuffle Gait velocity: reduced Gait velocity interpretation: <1.31 ft/sec, indicative of household ambulator General Gait Details: pt with short shuffling gait with reduced foot clearance bilaterally  Stairs            Wheelchair Mobility    Modified Rankin (Stroke Patients Only) Modified Rankin (Stroke Patients Only) Pre-Morbid Rankin Score: No significant disability Modified Rankin: Moderately severe disability     Balance Overall balance assessment: Needs assistance Sitting-balance support: Bilateral upper extremity supported;Feet supported Sitting balance-Leahy Scale: Fair Sitting balance - Comments: minG   Standing balance support: Bilateral upper extremity supported Standing balance-Leahy Scale: Poor Standing balance comment: minA of 2                             Pertinent Vitals/Pain Pain Assessment: Faces Faces Pain Scale: Hurts even more Pain Location: L flank, chest, buttocks Pain Descriptors / Indicators: Aching;Sore Pain Intervention(s): Limited activity within patient's tolerance    Home Living Family/patient expects to be discharged to:: Private residence Living Arrangements: Spouse/significant other Available Help at Discharge: Family;Available 24 hours/day Type of Home: House Home Access: Stairs to enter Entrance Stairs-Rails: None Entrance Stairs-Number of Steps: 2 steps from carport; 8 steps from back porch Home Layout: One level Home Equipment: Walker - 2 wheels Additional Comments: walker but does not use it, per pt report his wife has the beginning of dementia    Prior Function Level of Independence: Independent         Comments: prior to this month's hospitalizations patient was independent with  self care and mobility     Hand Dominance   Dominant Hand: Right    Extremity/Trunk Assessment   Upper Extremity  Assessment Upper Extremity Assessment: Generalized weakness    Lower Extremity Assessment Lower Extremity Assessment: Generalized weakness    Cervical / Trunk Assessment Cervical / Trunk Assessment: Kyphotic  Communication   Communication: HOH  Cognition Arousal/Alertness: Awake/alert Behavior During Therapy: Flat affect Overall Cognitive Status: Impaired/Different from baseline Area of Impairment: Following commands;Safety/judgement                       Following Commands: Follows one step commands with increased time Safety/Judgement: Decreased awareness of safety     General Comments: initially patient stating he can't lift his arms but of own volition will scratch his head      General Comments General comments (skin integrity, edema, etc.): pt perseverating on not being able to drink water    Exercises     Assessment/Plan    PT Assessment Patient needs continued PT services  PT Problem List Decreased strength;Decreased activity tolerance;Decreased balance;Decreased mobility;Decreased knowledge of use of DME;Decreased safety awareness;Decreased knowledge of precautions;Pain       PT Treatment Interventions DME instruction;Gait training;Stair training;Functional mobility training;Therapeutic activities;Balance training;Therapeutic exercise;Neuromuscular re-education;Patient/family education    PT Goals (Current goals can be found in the Care Plan section)  Acute Rehab PT Goals Patient Stated Goal: To improve mobility PT Goal Formulation: With patient Time For Goal Achievement: 12/13/19 Potential to Achieve Goals: Fair    Frequency Min 2X/week   Barriers to discharge        Co-evaluation PT/OT/SLP Co-Evaluation/Treatment: Yes Reason for Co-Treatment: Complexity of the patient's impairments (multi-system involvement);Necessary to address cognition/behavior during functional activity;For patient/therapist safety;To address functional/ADL transfers PT  goals addressed during session: Mobility/safety with mobility;Balance;Strengthening/ROM OT goals addressed during session: ADL's and self-care       AM-PAC PT "6 Clicks" Mobility  Outcome Measure Help needed turning from your back to your side while in a flat bed without using bedrails?: Total Help needed moving from lying on your back to sitting on the side of a flat bed without using bedrails?: Total Help needed moving to and from a bed to a chair (including a wheelchair)?: A Lot Help needed standing up from a chair using your arms (e.g., wheelchair or bedside chair)?: A Lot Help needed to walk in hospital room?: A Lot Help needed climbing 3-5 steps with a railing? : Total 6 Click Score: 9    End of Session Equipment Utilized During Treatment: (none) Activity Tolerance: Patient limited by fatigue;Treatment limited secondary to medical complications (Comment)(nausea) Patient left: in chair;with call bell/phone within reach;with chair alarm set Nurse Communication: Mobility status PT Visit Diagnosis: Unsteadiness on feet (R26.81);Muscle weakness (generalized) (M62.81)    Time: [id number] PT Time Calculation (min) (ACUTE ONLY): 15 min   Charges:   PT Evaluation $PT Eval Moderate Complexity: 1 Mod          Zenaida Niece, PT, DPT Acute Rehabilitation Pager: [phone]   Zenaida Niece 11/29/2019, 9:53 AM

## 2019-11-29 NOTE — Progress Notes (Addendum)
ANTICOAGULATION CONSULT NOTE - Initial Consult  Pharmacy Consult for Heparin Indication: Pulmonary Embolus  Allergies  Allergen Reactions  . Lasix [Furosemide] Other (See Comments)    aches  . Spironolactone Other (See Comments)    Red, puffy, eyes, almost hospitalized  . Aspirin Other (See Comments)    Bleeding.    . Lipitor [Atorvastatin] Other (See Comments)    tired  . Lopressor [Metoprolol Tartrate] Other (See Comments)    Sleepy all the time 11.27.2020 Patient is currently taking Lopressor.  [name] Penicillins Hives and Rash    Has patient had a PCN reaction causing immediate rash, facial/tongue/throat swelling, SOB or lightheadedness with hypotension: No Has patient had a PCN reaction causing severe rash involving mucus membranes or skin necrosis: No Has patient had a PCN reaction that required hospitalization No Has patient had a PCN reaction occurring within the last 10 years: Yes If all of the above answers are "NO", then may proceed with Cephalosporin use.     Patient Measurements: Height: 5\' 6"  (167.6 cm) Weight: 139 lb 11.2 oz (63.4 kg) IBW/kg (Calculated) : 63.8 Heparin Dosing Weight: 63.4 kg  Vital Signs: Temp: 98.1 F (36.7 C) (12/24 1014) Temp Source: Oral (12/24 1014) BP: 118/61 (12/24 1014) Pulse Rate: 94 (12/24 1014)  Labs: Recent Labs    11/28/19 1320 11/28/19 1500 11/29/19 0930 11/29/19 1316  HGB 12.0*  --  11.8*  --   HCT 39.2  --  39.0  --   PLT 370  --  370  --   CREATININE 1.28*  --  1.46* 0.97  TROPONINIHS 235* 220*  --  197*    Estimated Creatinine Clearance: 46.3 mL/min (by C-G formula based on SCr of 0.97 mg/dL).   Assessment: 85 yr old male admitted on 11/28/19 from rehab for generalized weakness, nausea and lethargy. 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 Pt was also recently hospitalized (11/19/19) for CVA with distal basal artery occlusion, requiring IR for thrombectomy.  CT this  afternoon was positive for acute pulmonary embolism with a large occlusive thrombus within the left main pulmonary artery occluding the left lower lobe pulmonary arterial branches, small nonocclusive thrombus in the right middle lobe segmental branches; no evidence of right heart strain. CT also showed possible nonocclusive thrombus in the right femoral vein. D-dimer is elevated at 2.39; H/H 11.8/39.0, platelets 370 (all stable).  Patient was on no anticoagulants PTA. He has been receiving Lovenox 40 mg SQ daily this hospitalization (last dose at 2052 on 11/28/19.  Pharmacy is consulted to dose heparin for PE.  Goal of Therapy:  Heparin level 0.3-0.5 units/ml (recent CVA) Monitor platelets by anticoagulation protocol: Yes   Plan:  With diagnosis of new PE (and recent CVA ~2 wks ago), will give 1/2 bolus of heparin (1600 units IV X 1), then initiate heparin infusion at 900 units/hr Check 8-hr heparin level Monitor daily heparin level, CBC Monitor for signs/symptoms of bleeding  Gillermina Hu, PharmD, BCPS, [facility] Clinical Pharmacist 11/29/2019,5:54 PM

## 2019-11-29 NOTE — Evaluation (Signed)
Occupational Therapy Evaluation Patient Details Name: Kenneth [name] MRN: [id number] DOB: 1928-11-19 Today's Date: 11/29/2019    History of Present Illness 85 y.o. 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  Patient presents from rehab for generalized weakness, nausea, and lethargy started last night.   Clinical Impression   Patient is an 85 year old male at baseline lives with his spouse in a single level home with 2 steps to enter. Patient was in rehab prior to this admission after STEMI s/p stent and multiple infarcts, pt reports he utilizing bed side commode and rolling walker with assist. Currently patient with decreased strength, activity tolerance, balance requiring max A for bed mobility and min x2 HHA to bedside chair. Recommend continued acute OT services to maximize patient safety and independence with self care.    Follow Up Recommendations  SNF;Supervision/Assistance - 24 hour    Equipment Recommendations  Other (comment)(defer to next venue)       Precautions / Restrictions Precautions Precautions: Fall Precaution Comments: HOH Restrictions Weight Bearing Restrictions: No      Mobility Bed Mobility Overal bed mobility: Needs Assistance Bed Mobility: Supine to Sit     Supine to sit: Max assist;+2 for physical assistance;HOB elevated     General bed mobility comments: increased time and assist to mobilize LEs to EOB, max A for trunk support   Transfers Overall transfer level: Needs assistance Equipment used: 2 person hand held assist Transfers: Sit to/from Stand Sit to Stand: Min assist;+2 physical assistance         General transfer comment: min x2 HHA for sit to stand, cues for body mechanics    Balance Overall balance assessment: Needs assistance Sitting-balance support:  Bilateral upper extremity supported;Feet supported Sitting balance-Leahy Scale: Fair Sitting balance - Comments: minG   Standing balance support: Bilateral upper extremity supported Standing balance-Leahy Scale: Poor Standing balance comment: minA of 2                           ADL either performed or assessed with clinical judgement   ADL Overall ADL's : Needs assistance/impaired Eating/Feeding: Set up;Bed level   Grooming: Wash/dry face;Bed level;Set up   Upper Body Bathing: Minimal assistance;Sitting   Lower Body Bathing: Sitting/lateral leans;Sit to/from stand;Maximal assistance   Upper Body Dressing : Minimal assistance;Sitting   Lower Body Dressing: Total assistance;Sitting/lateral leans Lower Body Dressing Details (indicate cue type and reason): total A to don socks Toilet Transfer: BSC;Cueing for safety;+2 for safety/equipment;Minimal assistance Toilet Transfer Details (indicate cue type and reason): hand held assist, pt able to take few steps from EOB to recliner chair, limited eccentric control, generalized weakness Toileting- Clothing Manipulation and Hygiene: Sit to/from stand;Maximal assistance;Sitting/lateral lean       Functional mobility during ADLs: Minimal assistance;+2 for physical assistance;Cueing for safety;Cueing for sequencing General ADL Comments: generalized weakness requiring increased level of assistance for self care tasks than pt baseline     Vision Baseline Vision/History: Wears glasses Wears Glasses: At all times Patient Visual Report: No change from baseline              Pertinent Vitals/Pain Pain Assessment: Faces Faces Pain Scale: Hurts even more Pain Location: L flank, chest, buttocks Pain Descriptors / Indicators: Aching;Sore Pain Intervention(s): Limited activity within patient's tolerance  Hand Dominance Right   Extremity/Trunk Assessment Upper Extremity Assessment Upper Extremity Assessment: Generalized  weakness   Lower Extremity Assessment Lower Extremity Assessment: Defer to PT eval   Cervical / Trunk Assessment Cervical / Trunk Assessment: Kyphotic   Communication Communication Communication: HOH   Cognition Arousal/Alertness: Awake/alert Behavior During Therapy: Flat affect Overall Cognitive Status: Impaired/Different from baseline Area of Impairment: Following commands;Safety/judgement                       Following Commands: Follows one step commands with increased time Safety/Judgement: Decreased awareness of safety     General Comments: initially patient stating he can't lift his arms but of own volition will scratch his head   General Comments  pt perseverating on not being able to drink water            Home Living Family/patient expects to be discharged to:: Private residence Living Arrangements: Spouse/significant other Available Help at Discharge: Family;Available 24 hours/day Type of Home: House Home Access: Stairs to enter [employer] of Steps: 2 steps from carport; 8 steps from back porch Entrance Stairs-Rails: None Home Layout: One level     Bathroom Shower/Tub: Teacher, early years/pre: Standard Bathroom Accessibility: Yes How Accessible: Accessible via walker Home Equipment: Walker - 2 wheels   Additional Comments: walker but does not use it, per pt report his wife has the beginning of dementia  Lives With: Spouse    Prior Functioning/Environment Level of Independence: Independent        Comments: prior to this month's hospitalizations patient was independent with self care and mobility        OT Problem List: Decreased strength;Decreased activity tolerance;Impaired balance (sitting and/or standing);Decreased safety awareness;Decreased cognition      OT Treatment/Interventions: Self-care/ADL training;Therapeutic exercise;Energy conservation;Therapeutic activities;Patient/family education;Balance training     OT Goals(Current goals can be found in the care plan section) Acute Rehab OT Goals Patient Stated Goal: To improve mobility OT Goal Formulation: With patient Time For Goal Achievement: 12/13/19 Potential to Achieve Goals: Good  OT Frequency: Min 2X/week            Co-evaluation PT/OT/SLP Co-Evaluation/Treatment: Yes Reason for Co-Treatment: Complexity of the patient's impairments (multi-system involvement);Necessary to address cognition/behavior during functional activity;For patient/therapist safety;To address functional/ADL transfers PT goals addressed during session: Mobility/safety with mobility;Balance;Strengthening/ROM OT goals addressed during session: ADL's and self-care      AM-PAC OT "6 Clicks" Daily Activity     Outcome Measure Help from another person eating meals?: A Little Help from another person taking care of personal grooming?: A Little Help from another person toileting, which includes using toliet, bedpan, or urinal?: A Lot Help from another person bathing (including washing, rinsing, drying)?: A Lot Help from another person to put on and taking off regular upper body clothing?: A Little Help from another person to put on and taking off regular lower body clothing?: A Lot 6 Click Score: 15   End of Session Nurse Communication: Mobility status  Activity Tolerance: Patient tolerated treatment well Patient left: in chair;with call bell/phone within reach;with chair alarm set  OT Visit Diagnosis: Unsteadiness on feet (R26.81);Muscle weakness (generalized) (M62.81)                Time: [id number] OT Time Calculation (min): 38 min Charges:  OT General Charges $OT Visit: 1 Visit OT Evaluation $OT Eval Moderate Complexity: 1 Mod OT Treatments $Self Care/Home Management : 8-22 mins  Shon Millet OT OT  office: [phone]  Rosemary Holms 11/29/2019, 9:57 AM

## 2019-11-29 NOTE — Plan of Care (Signed)
Alert and oriented X3. Able to communicate needs. Denies any pain or discomfort. Skin warm and dry. Call bell within reach. No respiratory distress noted at this moment. Will monitor patient closely.

## 2019-11-29 NOTE — Progress Notes (Signed)
Echocardiogram cancelled per Dr. Sallyanne Kuster.  Please call cardiology with questions.

## 2019-11-29 NOTE — TOC Initial Note (Signed)
Transition of Care [facility]) - Initial/Assessment Note    Patient Details  Name: Kenneth [name] MRN: [id number] Date of Birth: [birth year]  Transition [facility] Care [facility]) CM/SW Contact:    Marilu Favre, RN Phone Number: 11/29/2019, 12:29 PM  Clinical Narrative:                 Spoke to patient and daughter Daleen Snook at bedside. Patient recently discharge from hospital to [facility] for rehab. Patient and daughter would like for patient to return at discharge to continue rehab   Expected Discharge Plan: Skilled Nursing Facility Barriers to Discharge: Continued Medical Work up   Patient Goals and CMS Choice Patient states their goals for this hospitalization and ongoing recovery are:: to return to [facility] for rehab CMS Medicare.gov Compare Post Acute Care list provided to:: Patient Choice offered to / list presented to : Patient, Adult Children  Expected Discharge Plan and Services Expected Discharge Plan: [place]   Discharge Planning Services: CM Consult Post Acute Care Choice: [place] Living arrangements for the past 2 months: Single Family Home                 DME Arranged: N/A         HH Arranged: NA          Prior Living Arrangements/Services Living arrangements for the past 2 months: Single Family Home Lives with:: Spouse Patient language and need for interpreter reviewed:: Yes        Need for Family Participation in Patient Care: Yes (Comment) Care giver support system in place?: Yes (comment)   Criminal Activity/Legal Involvement Pertinent to Current Situation/Hospitalization: No - Comment as needed  Activities of Daily Living Home Assistive Devices/Equipment: Wheelchair ADL Screening (condition at time of admission) Patient's cognitive ability adequate to safely complete daily activities?: Yes Is the patient deaf or have difficulty hearing?: Yes Does the patient have difficulty seeing, even when  wearing glasses/contacts?: No Does the patient have difficulty concentrating, remembering, or making decisions?: No Patient able to express need for assistance with ADLs?: Yes Does the patient have difficulty dressing or bathing?: Yes Independently performs ADLs?: No Communication: Independent Does the patient have difficulty walking or climbing stairs?: Yes Weakness of Legs: Both Weakness of Arms/Hands: None  Permission Sought/Granted   Permission granted to share information with : Yes, Verbal Permission Granted  Share Information with NAME: Daughter Daleen Snook at bedside           Emotional Assessment Appearance:: Appears stated age       Alcohol / Substance Use: Not Applicable Psych Involvement: No (comment)  Admission diagnosis:  Dehydration [E86.0] Weakness [R53.1] AKI (acute kidney injury) ([place]) [N17.9] Acute kidney injury ([place]) [N17.9] Patient Active Problem List   Diagnosis Date Noted  . Acute kidney injury ([place]) 11/28/2019  . Prolonged QT interval 11/28/2019  . Chronic systolic CHF (congestive heart failure) ([place]) 11/28/2019  . History of CVA (cerebrovascular accident) 11/28/2019  . Normocytic anemia 11/28/2019  . DNR (do not resuscitate) 11/28/2019  . Generalized weakness 11/28/2019  . Dehydration 11/28/2019  . Hx of CABG   . Pure hypercholesterolemia   . Acute embolic stroke ([place]) 0000000  . 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 Acute systolic heart failure ([place]) 11/16/2019  . Acute ST elevation myocardial infarction (STEMI) of posterior wall ([place]) 11/12/2019  . ST elevation myocardial infarction involving right coronary artery ([place])   . Diverticulitis 11/02/2019  . Colostomy status ([place]) 03/16/2016  .  Elevated troponin 09/13/2015  . Hemoptysis   . Nausea and vomiting 09/09/2015  . Small bowel obstruction ([place]) 09/09/2015  . Hypertensive urgency 09/09/2015  . Diabetes mellitus ([place]) 09/09/2015  . Colonic mass   . Intestinal obstruction ([place])   .  Preop cardiovascular exam 06/27/2014  . Left inguinal hernia 06/26/2014  . Dysphagia 09/28/2013    Class: Chronic  . Abnormal weight loss 09/28/2013    Class: Chronic  . HTN (hypertension)   . Coronary artery disease involving coronary bypass graft of native heart without angina pectoris   . Hyperlipidemia with target LDL less than 70   . GERD (gastroesophageal reflux disease)   . Nephrolithiasis   . Melanoma of skin, site unspecified   . Glaucoma    PCP:  Lavone Orn, MD Pharmacy:   [facility] [place], [address] [place] [address] Phone: [phone] Fax: [phone]  [facility] [place], [place] [address] [place] [address] Phone: [phone] Fax: [phone]  [facility] [place], [place] - [address] [place] [address] Phone: [phone] Fax: [phone]     Social Determinants of Health (SDOH) Interventions    Readmission Risk Interventions No flowsheet data found.

## 2019-11-30 ENCOUNTER — Inpatient Hospital Stay (HOSPITAL_COMMUNITY): Payer: Medicare Other

## 2019-11-30 DIAGNOSIS — I2699 Other pulmonary embolism without acute cor pulmonale: Secondary | ICD-10-CM

## 2019-11-30 DIAGNOSIS — N189 Chronic kidney disease, unspecified: Secondary | ICD-10-CM

## 2019-11-30 DIAGNOSIS — K529 Noninfective gastroenteritis and colitis, unspecified: Secondary | ICD-10-CM

## 2019-11-30 DIAGNOSIS — J189 Pneumonia, unspecified organism: Secondary | ICD-10-CM

## 2019-11-30 LAB — CBC
HCT: 31.3 % — ABNORMAL LOW (ref 39.0–52.0)
Hemoglobin: 9.9 g/dL — ABNORMAL LOW (ref 13.0–17.0)
MCH: 30.3 pg (ref 26.0–34.0)
MCHC: 31.6 g/dL (ref 30.0–36.0)
MCV: 95.7 fL (ref 80.0–100.0)
Platelets: 289 10*3/uL (ref 150–400)
RBC: 3.27 MIL/uL — ABNORMAL LOW (ref 4.22–5.81)
RDW: 14 % (ref 11.5–15.5)
WBC: 9.7 10*3/uL (ref 4.0–10.5)
nRBC: 0 % (ref 0.0–0.2)

## 2019-11-30 LAB — BASIC METABOLIC PANEL
Anion gap: 10 (ref 5–15)
BUN: 18 mg/dL (ref 8–23)
CO2: 20 mmol/L — ABNORMAL LOW (ref 22–32)
Calcium: 8.7 mg/dL — ABNORMAL LOW (ref 8.9–10.3)
Chloride: 108 mmol/L (ref 98–111)
Creatinine, Ser: 0.77 mg/dL (ref 0.61–1.24)
GFR calc Af Amer: >60 mL/min (ref >60)
GFR calc non Af Amer: >60 mL/min (ref >60)
Glucose, Bld: 165 mg/dL — ABNORMAL HIGH (ref 70–99)
Potassium: 4.1 mmol/L (ref 3.5–5.1)
Sodium: 138 mmol/L (ref 135–145)

## 2019-11-30 LAB — HEPARIN LEVEL (UNFRACTIONATED)
Heparin Unfractionated: 0.41 IU/mL (ref 0.30–0.70)
Heparin Unfractionated: 0.27 IU/mL — ABNORMAL LOW (ref 0.30–0.70)
Heparin Unfractionated: 0.39 IU/mL (ref 0.30–0.70)

## 2019-11-30 MED ORDER — PIPERACILLIN-TAZOBACTAM 3.375 G IVPB 30 MIN: 3.3750 g | Freq: Three times a day (TID) | INTRAVENOUS | Status: DC

## 2019-11-30 MED ORDER — METRONIDAZOLE IN NACL 5-0.79 MG/ML-% IV SOLN
500.0000 mg | Freq: Three times a day (TID) | INTRAVENOUS | Status: DC
  Administered 2019-11-30 – 2019-12-04 (×12): 500 mg via INTRAVENOUS
  Filled 2019-11-30 (×13): qty 100

## 2019-11-30 MED ORDER — TORSEMIDE 20 MG PO TABS
20.0000 mg | ORAL_TABLET | Freq: Every day | ORAL | Status: DC
  Administered 2019-11-30 – 2019-12-01 (×2): 20 mg via ORAL
  Filled 2019-11-30 (×4): qty 1

## 2019-11-30 MED ORDER — SODIUM CHLORIDE 0.9 % IV SOLN
2.0000 g | Freq: Two times a day (BID) | INTRAVENOUS | Status: DC
  Administered 2019-11-30 – 2019-12-04 (×9): 2 g via INTRAVENOUS
  Filled 2019-11-30 (×10): qty 2

## 2019-11-30 NOTE — Progress Notes (Addendum)
ANTICOAGULATION CONSULT NOTE - Initial Consult  Pharmacy Consult for Heparin Indication: Pulmonary Embolus  Allergies  Allergen Reactions  . Lasix [Furosemide] Other (See Comments)    aches  . Spironolactone Other (See Comments)    Red, puffy, eyes, almost hospitalized  . Aspirin Other (See Comments)    Bleeding.    . Lipitor [Atorvastatin] Other (See Comments)    tired  . Lopressor [Metoprolol Tartrate] Other (See Comments)    Sleepy all the time 11.27.2020 Patient is currently taking Lopressor.  [name] Penicillins Hives and Rash    Has patient had a PCN reaction causing immediate rash, facial/tongue/throat swelling, SOB or lightheadedness with hypotension: No Has patient had a PCN reaction causing severe rash involving mucus membranes or skin necrosis: No Has patient had a PCN reaction that required hospitalization No Has patient had a PCN reaction occurring within the last 10 years: Yes If all of the above answers are "NO", then may proceed with Cephalosporin use.     Patient Measurements: Height: 5\' 6"  (167.6 cm) Weight: 140 lb 10.5 oz (63.8 kg) IBW/kg (Calculated) : 63.8 Heparin Dosing Weight: 63.4 kg  Vital Signs: Temp: 97.8 F (36.6 C) (12/25 1149) Temp Source: Oral (12/25 0829) BP: 117/69 (12/25 1149) Pulse Rate: 70 (12/25 1149)  Labs: Recent Labs    11/28/19 1320 11/28/19 1500 11/29/19 0930 11/29/19 1316 11/30/19 0215 11/30/19 1033  HGB 12.0*  --  11.8*  --  9.9*  --   HCT 39.2  --  39.0  --  31.3*  --   PLT 370  --  370  --  289  --   HEPARINUNFRC  --   --   --   --  0.41 0.27*  CREATININE 1.28*  --  1.46* 0.97  --  0.77  TROPONINIHS 235* 220*  --  197*  --   --     Estimated Creatinine Clearance: 56.5 mL/min (by C-G formula based on SCr of 0.77 mg/dL).   Assessment: 85 yr old male admitted on 11/28/19 from rehab for generalized weakness, nausea and lethargy. 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 Pt was  also recently hospitalized (11/19/19) for CVA with distal basal artery occlusion, requiring IR for thrombectomy.  CTA was positive for acute pulmonary embolism with a large occlusive thrombus within the left main pulmonary artery occluding the left lower lobe pulmonary arterial branches, small nonocclusive thrombus in the right middle lobe segmental branches; no evidence of right heart strain. CT also showed possible nonocclusive thrombus in the right femoral vein. D-dimer is elevated at 2.39; H/H 9.9/31.3, platelets 289 (all stable).   Patient was on no anticoagulants PTA. He was receiving Lovenox 40 mg SQ daily this hospitalization (last dose at 2052 on 11/28/19).  Pharmacy is consulted to dose heparin for PE. Heparin level is subtherapeutic at 0.27.   Goal of Therapy:  Heparin level 0.3-0.5 units/ml (recent CVA) Monitor platelets by anticoagulation protocol: Yes   Plan:  Increase heparin rate to 950 units/hr Check 8-hr heparin level Monitor daily heparin level, CBC Monitor for signs/symptoms of bleeding  Sherren Kerns, PharmD PGY1 Acute Care Pharmacy Resident 11/30/2019,12:29 PM

## 2019-11-30 NOTE — Progress Notes (Signed)
[place] for Heparin Indication: Pulmonary Embolus  Allergies  Allergen Reactions  . Lasix [Furosemide] Other (See Comments)    aches  . Spironolactone Other (See Comments)    Red, puffy, eyes, almost hospitalized  . Aspirin Other (See Comments)    Bleeding.    . Lipitor [Atorvastatin] Other (See Comments)    tired  . Lopressor [Metoprolol Tartrate] Other (See Comments)    Sleepy all the time 11.27.2020 Patient is currently taking Lopressor.  [name] Penicillins Hives and Rash    Has patient had a PCN reaction causing immediate rash, facial/tongue/throat swelling, SOB or lightheadedness with hypotension: No Has patient had a PCN reaction causing severe rash involving mucus membranes or skin necrosis: No Has patient had a PCN reaction that required hospitalization No Has patient had a PCN reaction occurring within the last 10 years: Yes If all of the above answers are "NO", then may proceed with Cephalosporin use.     Patient Measurements: Height: 5\' 6"  (167.6 cm) Weight: 140 lb 10.5 oz (63.8 kg) IBW/kg (Calculated) : 63.8 Heparin Dosing Weight: 63.4 kg  Vital Signs: Temp: 98.7 F (37.1 C) (12/25 2038) Temp Source: Oral (12/25 2038) BP: 104/72 (12/25 2038) Pulse Rate: 125 (12/25 2038)  Labs: Recent Labs    11/28/19 1320 11/28/19 1500 11/29/19 0930 11/29/19 1316 11/30/19 0215 11/30/19 1033 11/30/19 2106  HGB 12.0*  --  11.8*  --  9.9*  --   --   HCT 39.2  --  39.0  --  31.3*  --   --   PLT 370  --  370  --  289  --   --   HEPARINUNFRC  --   --   --   --  0.41 0.27* 0.39  CREATININE 1.28*  --  1.46* 0.97  --  0.77  --   TROPONINIHS 235* 220*  --  197*  --   --   --     Estimated Creatinine Clearance: 56.5 mL/min (by C-G formula based on SCr of 0.77 mg/dL).   Assessment: 86 yr old male admitted on 11/28/19 from rehab for generalized weakness, nausea and lethargy. He had recent STEMI, S/P PCI with DES to the SVG-PDA  (S/P CABG  in 1993) on 11/12/19. Pt was also recently hospitalized (11/19/19) for CVA with distal basal artery occlusion, requiring IR for thrombectomy.  CTA was positive for acute pulmonary embolism with a large occlusive thrombus within the left main pulmonary artery occluding the left lower lobe pulmonary arterial branches, small nonocclusive thrombus in the right middle lobe segmental branches; no evidence of right heart strain. CT also showed possible nonocclusive thrombus in the right femoral vein. D-dimer is elevated at 2.39; H/H 9.9/31.3, platelets 289 (all stable).   Patient was on no anticoagulants PTA. He was receiving Lovenox 40 mg SQ daily this hospitalization (last dose at 2052 on 11/28/19).  Pharmacy is consulted to dose heparin for PE.   Heparin level is therapeutic at 0.39 on 950 units/hr. No bleeding noted. RLE neg for DVT.  Goal of Therapy:  Heparin level 0.3-0.5 units/ml (recent CVA) Monitor platelets by anticoagulation protocol: Yes   Plan:  Continue heparin rate at 950 units/hr Monitor daily heparin level, CBC Monitor for signs/symptoms of bleeding  Thank you for involving pharmacy in this patient's care.  Renold Genta, PharmD, BCPS Clinical Pharmacist Clinical phone for 11/30/2019 until 10:30p is [phone] 11/30/2019 10:28 PM  **Pharmacist phone directory can be found on [email or web address] listed under [facility]**

## 2019-11-30 NOTE — Progress Notes (Signed)
ANTICOAGULATION CONSULT NOTE - Initial Consult  Pharmacy Consult for Heparin Indication: Pulmonary Embolus  Allergies  Allergen Reactions  . Lasix [Furosemide] Other (See Comments)    aches  . Spironolactone Other (See Comments)    Red, puffy, eyes, almost hospitalized  . Aspirin Other (See Comments)    Bleeding.    . Lipitor [Atorvastatin] Other (See Comments)    tired  . Lopressor [Metoprolol Tartrate] Other (See Comments)    Sleepy all the time 11.27.2020 Patient is currently taking Lopressor.  [name] Penicillins Hives and Rash    Has patient had a PCN reaction causing immediate rash, facial/tongue/throat swelling, SOB or lightheadedness with hypotension: No Has patient had a PCN reaction causing severe rash involving mucus membranes or skin necrosis: No Has patient had a PCN reaction that required hospitalization No Has patient had a PCN reaction occurring within the last 10 years: Yes If all of the above answers are "NO", then may proceed with Cephalosporin use.     Patient Measurements: Height: 5\' 6"  (167.6 cm) Weight: 140 lb 10.5 oz (63.8 kg) IBW/kg (Calculated) : 63.8 Heparin Dosing Weight: 63.4 kg  Vital Signs: Temp: 97.6 F (36.4 C) (12/25 0357) Temp Source: Oral (12/25 0357) BP: 109/70 (12/25 0357) Pulse Rate: 88 (12/25 0357)  Labs: Recent Labs    11/28/19 1320 11/28/19 1500 11/29/19 0930 11/29/19 1316 11/30/19 0215  HGB 12.0*  --  11.8*  --  9.9*  HCT 39.2  --  39.0  --  31.3*  PLT 370  --  370  --  289  HEPARINUNFRC  --   --   --   --  0.41  CREATININE 1.28*  --  1.46* 0.97  --   TROPONINIHS 235* 220*  --  197*  --     Estimated Creatinine Clearance: 46.6 mL/min (by C-G formula based on SCr of 0.97 mg/dL).   Assessment: 85 yr old male admitted on 11/28/19 from rehab for generalized weakness, nausea and lethargy. 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 Pt was also recently hospitalized (11/19/19) for CVA  with distal basal artery occlusion, requiring IR for thrombectomy.  CTA was positive for acute pulmonary embolism with a large occlusive thrombus within the left main pulmonary artery occluding the left lower lobe pulmonary arterial branches, small nonocclusive thrombus in the right middle lobe segmental branches; no evidence of right heart strain. CT also showed possible nonocclusive thrombus in the right femoral vein. D-dimer is elevated at 2.39; H/H 9.9/31.3, platelets 289 (all stable).  Patient was on no anticoagulants PTA. He was receiving Lovenox 40 mg SQ daily this hospitalization (last dose at 2052 on 11/28/19.  Pharmacy is consulted to dose heparin for PE.  Goal of Therapy:  Heparin level 0.3-0.5 units/ml (recent CVA) Monitor platelets by anticoagulation protocol: Yes   Plan:  Continue heparin at rate of 900 units/hr Check 8-hr heparin level Monitor daily heparin level, CBC Monitor for signs/symptoms of bleeding  Sherren Kerns, PharmD PGY1 Acute Care Pharmacy Resident 11/30/2019,7:25 AM

## 2019-11-30 NOTE — Progress Notes (Signed)
LE venous duplex       has been completed. Preliminary results can be found under CV proc through chart review. Yaw Escoto, BS, RDMS, RVT   

## 2019-11-30 NOTE — Progress Notes (Signed)
   No complaints relative to cardiac status.  Monitor raised question of AFib but ECG shows NSR.  No new recommendations.

## 2019-11-30 NOTE — Progress Notes (Signed)
PROGRESS NOTE    Kenneth [name]  [id number] DOB: 07/08/1928 DOA: 11/28/2019 PCP: Kenneth Orn, MD   Brief Narrative:  HPI: Kenneth [name] is a 85 y.o. 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  Patient presents from rehab for generalized weakness, nausea, and lethargy started last night.  Patient has had 2 hospitalizations at this month after suffering a STEMI and thereafter readmitted after having a stroke.  History is limited from the patient as he continues to stay that he needs water otherwise he will die.  Patient denies of having a dry mouth and requests water.  ED Course: Upon admission to the emergency department patient was seen to be afebrile, heart rate 71-127, respirations 15-43, blood pressures 98/76 ~124/89, and O2 saturations maintained on room air.  Labs significant for WBC 10.7, hemoglobin 12, sodium 147, BUN 32, creatinine 1.28, glucose 170, troponin II 35, and BNP 1428.2.  COVID-19 screening was negative on 12/18.   Initial EKG gave concern for ST wave elevations and cardiology was consulted.  Patient was given 500 mL bolus of normal saline IV fluids. TRH called to admit.  Assessment & Plan:   Active Problems:   HTN (hypertension)   Coronary artery disease involving coronary bypass graft of native heart without angina pectoris   Acute kidney injury (HCC)   Prolonged QT interval   Chronic systolic CHF (congestive heart failure) (HCC)   History of CVA (cerebrovascular accident)   Normocytic anemia   DNR (do not resuscitate)   Generalized weakness   Dehydration   Acute kidney injury superimposed on chronic kidney disease ([place])   Acute colitis   Community acquired pneumonia   Acute pulmonary embolism (HCC)   Generalized weakness secondary to dehydration: Patient presents with generalized  weakness after recently being discharged from the hospital.  He was dehydrated and received IV fluids.  He looks better today.  PT OT on board.  They recommend SNF.  Acute kidney injury: Patient's creatinine noted to be 1.28, but previously was 0.75 at discharge on the 19th.  Likely due to dehydration.  Now resolved.  CAD, elevated troponin/acute bilateral pulmonary embolism: Patient just recently hospitalized with a STEMI requiring PCI with DES to the SVG-PDA on 12/7.  Troponins 903 on 12/15, but repeat here 235->230 which appear to be tracking down appropriately.  EKG without any significant changes and patient not complaining of any chest pain.  Seen by cardiology and they are not concerned about this.  When I saw him on the morning of 11/29/2019, he did not have any chest pain.  However I was called later by nurse that he was having some chest pain.  EKG repeated did not show any changes.  We ordered another troponin which was 197, decreasing from the previous.  D-dimer was obtained which was elevated raising concern for possible PE.  CT angiogram stat was ordered and it was positive for acute pulmonary embolism with a large occlusive thrombus within the left main pulmonary artery occluding the left lower lobe pulmonary arterial branches. Small nonocclusive thrombus in the right middle lobe segmental branches. No evidence of right heart strain. will pursue 2 more troponins.  I received a call from radiology and patient was started on heparin IV right away.  Since he was hemodynamically stable so there was no indication for thrombectomy.  He  is doing much better today.  He is off of oxygen.  Still has left-sided abdominal pain but that is improving.  He is able to talk in full sentences opposed to yesterday when he was too lethargic to do so.  We will continue heparin drip for another 24 hours.  He had an echo recently done on 11/19/2019.  No need to repeat echo.  CT also shows possible right femoral vein  nonocclusive thrombus.  Will obtain Doppler lower extremity ultrasound.  Abdominal tenderness/large hiatal hernia/ ?  Possible acute colitis: Due to him complaining of abdominal pain and abdominal tenderness on exam, initially concern was for possible ileus.  Abdominal x-ray was obtained which did not show obstruction.  CT of the abdomen and pelvis with contrast was obtained which shows large hiatal hernia with the entire stomach herniated into the lower chest.  No diverticulitis but mild wall thickening of transverse colon.  Based on the exams finding which had upper abdominal tenderness, I think this is possible colitis and should be treated and for that I will start him on Zosyn.  Hypernatremia: Acute but mild and resolved.  Prolonged QT interval: Acute.  QTC elevated at 538. -Recheck EKG in a.m. -Hold QT prolonging medication  Systolic congestive heart failure: Patient appears to be hypovolemic at this time.  EF noted to be 20-25% on 12/7.  BMP initially had been 2174.  Seen by cardiology.  No concerns expressed.  Looks dry.  Torsemide has been on hold due to AKI and dehydration.  Now that AKI has resolved and he looks better hydrated, I will resume half dose of his diuretics.  He was on torsemide 20 mg twice daily and I will start him on 20 mg p.o. daily.  Essential hypertension: On admission patient's blood pressures were noted to be soft 90/72.  It appears after being discharged from the hospital last there were several changes made including discontinuation of 12.5 mg of metoprolol succinate and losartan changed to 25 mg twice daily.  His blood pressure is much better now and remains in normal range but that with holding his antihypertensives so I will continue to hold them and watch for now.  Recent CVA: Patient was recently hospitalized for distal basal artery occlusion with thready flow to the right PCA requiring interventional radiology for thrombectomy. -Continue Plavix  -Dysphagia  3 diet -Aspiration precautions  Normocytic anemia: Hemoglobin stable.  Anxiety -Held Lexapro due to prolonged QT  -Continue Xanax as needed  Hypothyroidism: TSH 2.833 on 11/27. -Continue levothyroxine  Hyperlipidemia -Continue Crestor   DVT prophylaxis: Lovenox Code Status: DNR present on admission Family Communication:  None present at bedside.  Called his daughter and updated her about PE and rest of the imaging studies and plan of care.  She was appreciative for the call. Disposition Plan: To be determined.  Estimated body mass index is 22.7 kg/m as calculated from the following:   Height as of this encounter: 5\' 6"  (1.676 m).   Weight as of this encounter: 63.8 kg.      Nutritional status:               Consultants:   Cardiology  Procedures:   None  Antimicrobials:   Zosyn today   Subjective: Seen and examined.  Still with left-sided chest pain.  No abdominal pain or other complaint.  No shortness of breath.  Objective: Vitals:   11/30/19 0021 11/30/19 0357 11/30/19 0600 11/30/19 0829  BP: 111/86 109/70  125/77  Pulse:  92 88  93  Resp: 20 20  18   Temp: 97.7 F (36.5 C) 97.6 F (36.4 C)  (!) 97.4 F (36.3 C)  TempSrc: Oral Oral  Oral  SpO2: 100% 100%  98%  Weight:   63.8 kg   Height:        Intake/Output Summary (Last 24 hours) at 11/30/2019 1117 Last data filed at 11/30/2019 0600 Gross per 24 hour  Intake 1668.94 ml  Output 1050 ml  Net 618.94 ml   Filed Weights   11/28/19 1238 11/29/19 0426 11/30/19 0600  Weight: 63.5 kg 63.4 kg 63.8 kg    Examination:  General exam: Appears calm and comfortable  Respiratory system: Decreased breath sounds at the bases. Respiratory effort normal. Cardiovascular system: S1 & S2 heard, RRR. No JVD, murmurs, rubs, gallops or clicks. No pedal edema. Gastrointestinal system: Abdomen is nondistended, soft and nontender. No organomegaly or masses felt. Normal bowel sounds heard. Central  nervous system: Alert and oriented. No focal neurological deficits. Extremities: Symmetric 5 x 5 power. Skin: No rashes, lesions or ulcers.  Psychiatry: Judgement and insight appear poor, mood & affect flat.   Data Reviewed: I have personally reviewed following labs and imaging studies  CBC: Recent Labs  Lab 11/24/19 0301 11/28/19 1320 11/29/19 0930 11/30/19 0215  WBC 8.1 10.7* 12.3* 9.7  NEUTROABS  --  8.7* 9.9*  --   HGB 9.2* 12.0* 11.8* 9.9*  HCT 30.2* 39.2 39.0 31.3*  MCV 101.0* 99.5 98.5 95.7  PLT 451* 370 370 [id number]   Basic Metabolic Panel: Recent Labs  Lab 11/24/19 0301 11/28/19 1320 11/29/19 0439 11/29/19 0930 11/29/19 1316  NA 139 147*  --  144 142  K 3.9 3.5  --  3.6 3.2*  CL 110 109  --  106 104  CO2 20* 18*  --  21* 23  GLUCOSE 126* 170*  --  173* 158*  BUN 14 32*  --  24* 23  CREATININE 0.75 1.28*  --  1.46* 0.97  CALCIUM 8.2* 9.1  --  9.0 8.9  MG  --   --  1.8  --   --    GFR: Estimated Creatinine Clearance: 46.6 mL/min (by C-G formula based on SCr of 0.97 mg/dL). Liver Function Tests: Recent Labs  Lab 11/28/19 1320  AST 16  ALT 14  ALKPHOS 82  BILITOT 1.2  PROT 6.5  ALBUMIN 2.9*   No results for input(s): LIPASE, AMYLASE in the last 168 hours. No results for input(s): AMMONIA in the last 168 hours. Coagulation Profile: No results for input(s): INR, PROTIME in the last 168 hours. Cardiac Enzymes: No results for input(s): CKTOTAL, CKMB, CKMBINDEX, TROPONINI in the last 168 hours. BNP (last 3 results) No results for input(s): PROBNP in the last 8760 hours. HbA1C: No results for input(s): HGBA1C in the last 72 hours. CBG: Recent Labs  Lab 11/23/19 1633 11/23/19 2141 11/24/19 0621 11/28/19 2216 11/29/19 0622  GLUCAP 95 120* 105* 138* 159*   Lipid Profile: No results for input(s): CHOL, HDL, LDLCALC, TRIG, CHOLHDL, LDLDIRECT in the last 72 hours. Thyroid Function Tests: No results for input(s): TSH, T4TOTAL, FREET4, T3FREE, THYROIDAB  in the last 72 hours. Anemia Panel: No results for input(s): VITAMINB12, FOLATE, FERRITIN, TIBC, IRON, RETICCTPCT in the last 72 hours. Sepsis Labs: Recent Labs  Lab 11/29/19 1316  LATICACIDVEN 1.5    Recent Results (from the past 240 hour(s))  C difficile quick scan w PCR reflex     Status: None  Collection Time: 11/23/19  6:10 AM   Specimen: STOOL  Result Value Ref Range Status   C Diff antigen NEGATIVE NEGATIVE Final   C Diff toxin NEGATIVE NEGATIVE Final   C Diff interpretation No C. difficile detected.  Final    Comment: Performed at [facility], [place] [address], [place], [place] [address]  Respiratory Panel by RT PCR (Flu A&B, Covid) -     Status: None   Collection Time: 11/23/19  1:18 PM  Result Value Ref Range Status   SARS Coronavirus 2 by RT PCR NEGATIVE NEGATIVE Final    Comment: (NOTE) SARS-CoV-2 target nucleic acids are NOT DETECTED. The SARS-CoV-2 RNA is generally detectable in upper respiratoy specimens during the acute phase of infection. The lowest concentration of SARS-CoV-2 viral copies this assay can detect is 131 copies/mL. A negative result does not preclude SARS-Cov-2 infection and should not be used as the sole basis for treatment or other patient management decisions. A negative result may occur with  improper specimen collection/handling, submission of specimen other than nasopharyngeal swab, presence of viral mutation(s) within the areas targeted by this assay, and inadequate number of viral copies (<131 copies/mL). A negative result must be combined with clinical observations, patient history, and epidemiological information. The expected result is Negative. Fact Sheet for Patients:  PinkCheek.be Fact Sheet for Healthcare Providers:  GravelBags.it This test is not yet ap proved or cleared by the [place] FDA and  has been authorized for detection and/or diagnosis of SARS-CoV-2  by FDA under an Emergency Use Authorization (EUA). This EUA will remain  in effect (meaning this test can be used) for the duration of the COVID-19 declaration under Section 564(b)(1) of the Act, 21 U.S.C. section 360bbb-3(b)(1), unless the authorization is terminated or revoked sooner.    Influenza A by PCR NEGATIVE NEGATIVE Final   Influenza B by PCR NEGATIVE NEGATIVE Final    Comment: (NOTE) The Xpert Xpress SARS-CoV-2/FLU/RSV assay is intended as an aid in  the diagnosis of influenza from Nasopharyngeal swab specimens and  should not be used as a sole basis for treatment. Nasal washings and  aspirates are unacceptable for Xpert Xpress SARS-CoV-2/FLU/RSV  testing. Fact Sheet for Patients: PinkCheek.be Fact Sheet for Healthcare Providers: GravelBags.it This test is not yet approved or cleared by the [place] FDA and  has been authorized for detection and/or diagnosis of SARS-CoV-2 by  FDA under an Emergency Use Authorization (EUA). This EUA will remain  in effect (meaning this test can be used) for the duration of the  Covid-19 declaration under Section 564(b)(1) of the Act, 21  U.S.C. section 360bbb-3(b)(1), unless the authorization is  terminated or revoked. Performed at [facility], [place] [address] [place], [place] [address]   SARS CORONAVIRUS 2 (TAT 6-24 HRS) Nasopharyngeal Nasopharyngeal Swab     Status: None   Collection Time: 11/28/19  6:30 PM   Specimen: Nasopharyngeal Swab  Result Value Ref Range Status   SARS Coronavirus 2 NEGATIVE NEGATIVE Final    Comment: (NOTE) SARS-CoV-2 target nucleic acids are NOT DETECTED. The SARS-CoV-2 RNA is generally detectable in upper and lower respiratory specimens during the acute phase of infection. Negative results do not preclude SARS-CoV-2 infection, do not rule out co-infections with other pathogens, and should not be used as the sole basis for treatment or  other patient management decisions. Negative results must be combined with clinical observations, patient history, and epidemiological information. The expected result is Negative. Fact Sheet for  Patients: SugarRoll.be Fact Sheet for Healthcare Providers: https://www.woods-mathews.com/ This test is not yet approved or cleared by the [place] FDA and  has been authorized for detection and/or diagnosis of SARS-CoV-2 by FDA under an Emergency Use Authorization (EUA). This EUA will remain  in effect (meaning this test can be used) for the duration of the COVID-19 declaration under Section 56 4(b)(1) of the Act, 21 U.S.C. section 360bbb-3(b)(1), unless the authorization is terminated or revoked sooner. Performed at [facility], [place] [address], [place], [place] [address]       Radiology Studies: DG Abd 1 View  Result Date: 11/29/2019 CLINICAL DATA:  Nausea, diarrhea, left upper quadrant pain EXAM: ABDOMEN - 1 VIEW COMPARISON:  CT 11/13/2019 FINDINGS: Nonobstructive bowel gas pattern. No organomegaly or free air. Right lower pole nephrolithiasis. Aortic and vascular calcifications noted. IMPRESSION: No obstruction or free air. Right lower pole nephrolithiasis. Electronically Signed   By: Rolm Baptise M.D.   On: 11/29/2019 10:45   CT ANGIO CHEST PE W OR WO CONTRAST  Result Date: 11/29/2019 CLINICAL DATA:  Lethargy, weakness and left upper quadrant abdominal pain. Elevated troponin levels. Possible pulmonary embolism. EXAM: CT ANGIOGRAPHY CHEST CT ABDOMEN AND PELVIS WITH CONTRAST TECHNIQUE: Multidetector CT imaging of the chest was performed using the standard protocol during bolus administration of intravenous contrast. Multiplanar CT image reconstructions and MIPs were obtained to evaluate the vascular anatomy. Multidetector CT imaging of the abdomen and pelvis was performed using the standard protocol during bolus administration of  intravenous contrast. CONTRAST:  132mL OMNIPAQUE IOHEXOL 350 MG/ML SOLN COMPARISON:  Abdominal radiographs 11/29/2019. Chest radiographs 11/28/2019. Chest CTA 05/04/2010. Abdominal CTA 11/13/2019. FINDINGS: CTA CHEST FINDINGS Cardiovascular: The pulmonary arteries are well opacified with contrast to the level of the subsegmental branches. There is a large occlusive thrombus within the left pulmonary artery, measuring up to 4.5 cm on image 55/1. This occludes the left lower lobe pulmonary arterial branches. The left upper lobe branches are patent. On the right, there is a small nonocclusive thrombus within the right middle lobe segmental branches (image 78/1). There is diffuse atherosclerosis of the aorta, great vessels and coronary arteries post median sternotomy and CABG. There is limited opacification of the systemic arteries. The heart is mildly enlarged. The right ventricle to left ventricle ratio is 0.84 (Within normal limits). No significant pericardial effusion. Mediastinum/Nodes: There are no enlarged mediastinal, hilar or axillary lymph nodes.A very large hiatal hernia is again noted with the entire stomach herniated into the lower chest. The trachea and thyroid gland appear unremarkable. Lungs/Pleura: There is no significant pleural effusion or pneumothorax. There is chronic left lower lobe atelectasis adjacent to the hiatal hernia with increased soft tissue thickening around the left lower lobe bronchi. There is no well-defined mass. Scattered calcified granulomas are present. Musculoskeletal/Chest wall: There is no chest wall mass or suspicious osseous finding. Previous median sternotomy. Review of the MIP images confirms the above findings. CT ABDOMEN and PELVIS FINDINGS Hepatobiliary: There are stable cysts within the right hepatic lobe. No suspicious hepatic findings. No evidence of gallstones, gallbladder wall thickening or biliary dilatation. Pancreas: Stable diffuse pancreatic atrophy with  scattered parenchymal calcifications consistent with chronic pancreatitis. There is a 10 mm cystic lesion in the pancreatic body on image 22/5 which appears unchanged. No pancreatic ductal dilatation or surrounding inflammation. Spleen: Normal in size without focal abnormality. Adrenals/Urinary Tract: The adrenal glands appear stable without suspicious findings. Nonobstructing 6 mm calculus in the lower pole of the right kidney  on image 36/5. No other urinary tract calculi. There is no evidence of renal mass or hydronephrosis. The bladder appears normal. Stomach/Bowel: As above, there is a large hiatal hernia. No significant abnormalities of the small bowel or appendix are seen. There are diverticular changes throughout the colon. There is mild wall thickening of the transverse colon which appears similar to the previous study. No surrounding inflammatory changes. Vascular/Lymphatic: There are no enlarged abdominal or pelvic lymph nodes. Diffuse aortic and branch vessel atherosclerosis. Possible nonocclusive thrombus in the right femoral vein. Reproductive: The prostate gland and seminal vesicles appear normal. Other: Postsurgical changes within the anterior abdominal wall. No residual hernia identified. There is no ascites or free air. Musculoskeletal: No acute or significant osseous findings. Chronic deformity of the left femoral neck appears unchanged. There is multilevel lumbar spondylosis. Review of the MIP images confirms the above findings. IMPRESSION: 1. Study is positive for acute pulmonary embolism with a large occlusive thrombus within the left main pulmonary artery occluding the left lower lobe pulmonary arterial branches. Small nonocclusive thrombus in the right middle lobe segmental branches. No evidence of right heart strain. 2. Increased left lower lobe perihilar opacity may reflect atelectasis or infarct. 3. No acute findings within the abdomen or pelvis. 4. Possible nonocclusive thrombus in the  right femoral vein. 5. Large hiatal hernia with the entire stomach herniated into the lower chest. Colonic diverticulosis and mild wall thickening of the transverse colon, similar to recent prior CT. 6. Sequela of chronic pancreatitis with stable small cystic lesion in the pancreatic body. 7. Aortic Atherosclerosis (ICD10-I70.0). 8. Critical Value/emergent results were called by telephone at the time of interpretation on 11/29/2019 at 5:47 pm to [place] , who verbally acknowledged these results. Electronically Signed   By: Richardean Sale M.D.   On: 11/29/2019 17:56   CT ABDOMEN PELVIS W CONTRAST  Result Date: 11/29/2019 CLINICAL DATA:  Lethargy, weakness and left upper quadrant abdominal pain. Elevated troponin levels. Possible pulmonary embolism. EXAM: CT ANGIOGRAPHY CHEST CT ABDOMEN AND PELVIS WITH CONTRAST TECHNIQUE: Multidetector CT imaging of the chest was performed using the standard protocol during bolus administration of intravenous contrast. Multiplanar CT image reconstructions and MIPs were obtained to evaluate the vascular anatomy. Multidetector CT imaging of the abdomen and pelvis was performed using the standard protocol during bolus administration of intravenous contrast. CONTRAST:  165mL OMNIPAQUE IOHEXOL 350 MG/ML SOLN COMPARISON:  Abdominal radiographs 11/29/2019. Chest radiographs 11/28/2019. Chest CTA 05/04/2010. Abdominal CTA 11/13/2019. FINDINGS: CTA CHEST FINDINGS Cardiovascular: The pulmonary arteries are well opacified with contrast to the level of the subsegmental branches. There is a large occlusive thrombus within the left pulmonary artery, measuring up to 4.5 cm on image 55/1. This occludes the left lower lobe pulmonary arterial branches. The left upper lobe branches are patent. On the right, there is a small nonocclusive thrombus within the right middle lobe segmental branches (image 78/1). There is diffuse atherosclerosis of the aorta, great vessels and coronary  arteries post median sternotomy and CABG. There is limited opacification of the systemic arteries. The heart is mildly enlarged. The right ventricle to left ventricle ratio is 0.84 (Within normal limits). No significant pericardial effusion. Mediastinum/Nodes: There are no enlarged mediastinal, hilar or axillary lymph nodes.A very large hiatal hernia is again noted with the entire stomach herniated into the lower chest. The trachea and thyroid gland appear unremarkable. Lungs/Pleura: There is no significant pleural effusion or pneumothorax. There is chronic left lower lobe atelectasis adjacent to the hiatal hernia with  increased soft tissue thickening around the left lower lobe bronchi. There is no well-defined mass. Scattered calcified granulomas are present. Musculoskeletal/Chest wall: There is no chest wall mass or suspicious osseous finding. Previous median sternotomy. Review of the MIP images confirms the above findings. CT ABDOMEN and PELVIS FINDINGS Hepatobiliary: There are stable cysts within the right hepatic lobe. No suspicious hepatic findings. No evidence of gallstones, gallbladder wall thickening or biliary dilatation. Pancreas: Stable diffuse pancreatic atrophy with scattered parenchymal calcifications consistent with chronic pancreatitis. There is a 10 mm cystic lesion in the pancreatic body on image 22/5 which appears unchanged. No pancreatic ductal dilatation or surrounding inflammation. Spleen: Normal in size without focal abnormality. Adrenals/Urinary Tract: The adrenal glands appear stable without suspicious findings. Nonobstructing 6 mm calculus in the lower pole of the right kidney on image 36/5. No other urinary tract calculi. There is no evidence of renal mass or hydronephrosis. The bladder appears normal. Stomach/Bowel: As above, there is a large hiatal hernia. No significant abnormalities of the small bowel or appendix are seen. There are diverticular changes throughout the colon. There is  mild wall thickening of the transverse colon which appears similar to the previous study. No surrounding inflammatory changes. Vascular/Lymphatic: There are no enlarged abdominal or pelvic lymph nodes. Diffuse aortic and branch vessel atherosclerosis. Possible nonocclusive thrombus in the right femoral vein. Reproductive: The prostate gland and seminal vesicles appear normal. Other: Postsurgical changes within the anterior abdominal wall. No residual hernia identified. There is no ascites or free air. Musculoskeletal: No acute or significant osseous findings. Chronic deformity of the left femoral neck appears unchanged. There is multilevel lumbar spondylosis. Review of the MIP images confirms the above findings. IMPRESSION: 1. Study is positive for acute pulmonary embolism with a large occlusive thrombus within the left main pulmonary artery occluding the left lower lobe pulmonary arterial branches. Small nonocclusive thrombus in the right middle lobe segmental branches. No evidence of right heart strain. 2. Increased left lower lobe perihilar opacity may reflect atelectasis or infarct. 3. No acute findings within the abdomen or pelvis. 4. Possible nonocclusive thrombus in the right femoral vein. 5. Large hiatal hernia with the entire stomach herniated into the lower chest. Colonic diverticulosis and mild wall thickening of the transverse colon, similar to recent prior CT. 6. Sequela of chronic pancreatitis with stable small cystic lesion in the pancreatic body. 7. Aortic Atherosclerosis (ICD10-I70.0). 8. Critical Value/emergent results were called by telephone at the time of interpretation on 11/29/2019 at 5:47 pm to Madelia , who verbally acknowledged these results. Electronically Signed   By: Richardean Sale M.D.   On: 11/29/2019 17:56   DG Chest Port 1 View  Result Date: 11/28/2019 CLINICAL DATA:  Weakness, nausea and lethargy since last night. EXAM: PORTABLE CHEST 1 VIEW COMPARISON:   Single-view of the chest 11/20/2019 and 09/10/2017. FINDINGS: Scattered punctate calcified granulomata are seen bilaterally, unchanged. Lungs otherwise clear. No pneumothorax or pleural effusion. Heart size is upper normal. Atherosclerosis is seen. Large hiatal hernia is identified. No acute or focal bony abnormality. IMPRESSION: No acute disease. Hiatal hernia. Electronically Signed   By: Inge Rise M.D.   On: 11/28/2019 12:52    Scheduled Meds:  alum & mag hydroxide-simeth  30 mL Oral Once   clopidogrel  75 mg Oral Daily   ferrous sulfate  325 mg Oral Q breakfast   latanoprost  1 drop Both Eyes QHS   levothyroxine  50 mcg Oral Q0600   rosuvastatin  20 mg Oral  q1800   sodium chloride flush  3 mL Intravenous Q12H   vitamin B-12  500 mcg Oral Daily   Continuous Infusions:  dextrose 50 mL/hr at 11/29/19 2118   heparin 900 Units/hr (11/29/19 1921)   piperacillin-tazobactam       LOS: 1 day   Time spent: 35 minutes   Darliss Cheney, MD [facility]  11/30/2019, 11:17 AM   To contact the attending provider between 7A-7P or the covering provider during after hours 7P-7A, please log into the web site [email or web address] and use password [email or web address].

## 2019-12-01 LAB — BASIC METABOLIC PANEL
Anion gap: 11 (ref 5–15)
BUN: 20 mg/dL (ref 8–23)
CO2: 26 mmol/L (ref 22–32)
Calcium: 8.1 mg/dL — ABNORMAL LOW (ref 8.9–10.3)
Chloride: 103 mmol/L (ref 98–111)
Creatinine, Ser: 0.92 mg/dL (ref 0.61–1.24)
GFR calc Af Amer: >60 mL/min (ref >60)
GFR calc non Af Amer: >60 mL/min (ref >60)
Glucose, Bld: 157 mg/dL — ABNORMAL HIGH (ref 70–99)
Potassium: 3 mmol/L — ABNORMAL LOW (ref 3.5–5.1)
Sodium: 140 mmol/L (ref 135–145)

## 2019-12-01 LAB — CBC
HCT: 34.9 % — ABNORMAL LOW (ref 39.0–52.0)
Hemoglobin: 11.3 g/dL — ABNORMAL LOW (ref 13.0–17.0)
MCH: 30 pg (ref 26.0–34.0)
MCHC: 32.4 g/dL (ref 30.0–36.0)
MCV: 92.6 fL (ref 80.0–100.0)
Platelets: 281 10*3/uL (ref 150–400)
RBC: 3.77 MIL/uL — ABNORMAL LOW (ref 4.22–5.81)
RDW: 13.6 % (ref 11.5–15.5)
WBC: 9.7 10*3/uL (ref 4.0–10.5)
nRBC: 0 % (ref 0.0–0.2)

## 2019-12-01 LAB — HEPARIN LEVEL (UNFRACTIONATED): Heparin Unfractionated: 0.29 IU/mL — ABNORMAL LOW (ref 0.30–0.70)

## 2019-12-01 MED ORDER — ENOXAPARIN SODIUM 100 MG/ML ~~LOC~~ SOLN
90.0000 mg | SUBCUTANEOUS | Status: DC
  Administered 2019-12-01: 90 mg via SUBCUTANEOUS
  Filled 2019-12-01: qty 1

## 2019-12-01 MED ORDER — POTASSIUM CHLORIDE CRYS ER 20 MEQ PO TBCR
40.0000 meq | EXTENDED_RELEASE_TABLET | Freq: Two times a day (BID) | ORAL | Status: AC
  Administered 2019-12-01 (×2): 40 meq via ORAL
  Filled 2019-12-01 (×2): qty 2

## 2019-12-01 MED ORDER — WARFARIN - PHARMACIST DOSING INPATIENT: Freq: Every day | Status: DC

## 2019-12-01 MED ORDER — WARFARIN SODIUM 1 MG PO TABS
1.0000 mg | ORAL_TABLET | Freq: Once | ORAL | Status: AC
  Administered 2019-12-01: 1 mg via ORAL
  Filled 2019-12-01: qty 1

## 2019-12-01 MED ORDER — AMIODARONE HCL 200 MG PO TABS
400.0000 mg | ORAL_TABLET | Freq: Two times a day (BID) | ORAL | Status: DC
  Administered 2019-12-01 – 2019-12-02 (×3): 400 mg via ORAL
  Filled 2019-12-01 (×5): qty 2

## 2019-12-01 NOTE — Progress Notes (Signed)
PROGRESS NOTE    Kenneth [name]  [id number] DOB: [birth year] DOA: 11/28/2019 PCP: Lavone Orn, MD   Brief Narrative:  HPI: Kenneth [name] is a 85 y.o. 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  Patient presents from rehab for generalized weakness, nausea, and lethargy started last night.  Patient has had 2 hospitalizations at this month after suffering a STEMI and thereafter readmitted after having a stroke.  History is limited from the patient as he continues to stay that he needs water otherwise he will die.  Patient denies of having a dry mouth and requests water.  ED Course: Upon admission to the emergency department patient was seen to be afebrile, heart rate 71-127, respirations 15-43, blood pressures 98/76 ~124/89, and O2 saturations maintained on room air.  Labs significant for WBC 10.7, hemoglobin 12, sodium 147, BUN 32, creatinine 1.28, glucose 170, troponin II 35, and BNP 1428.2.  COVID-19 screening was negative on 12/18.   Initial EKG gave concern for ST wave elevations and cardiology was consulted.  Patient was given 500 mL bolus of normal saline IV fluids. TRH called to admit.  Assessment & Plan:   Active Problems:   HTN (hypertension)   Coronary artery disease involving coronary bypass graft of native heart without angina pectoris   Acute kidney injury (HCC)   Prolonged QT interval   Chronic systolic CHF (congestive heart failure) (HCC)   History of CVA (cerebrovascular accident)   Normocytic anemia   DNR (do not resuscitate)   Generalized weakness   Dehydration   Acute kidney injury superimposed on chronic kidney disease ([place])   Acute colitis   Community acquired pneumonia   Acute pulmonary embolism (HCC)   Generalized weakness secondary to dehydration: Patient presents with generalized  weakness after recently being discharged from the hospital.  He was dehydrated and received IV fluids.  He looks better today.  PT OT on board.  They recommend SNF.  Patient may be ready for discharge tomorrow.  Acute kidney injury: Patient's creatinine noted to be 1.28, but previously was 0.75 at discharge on the 19th.  Likely due to dehydration.  Now resolved.  CAD, elevated troponin/acute bilateral pulmonary embolism: Patient just recently hospitalized with a STEMI requiring PCI with DES to the SVG-PDA on 12/7.  Troponins 903 on 12/15, but repeat here 235->230 which appear to be tracking down appropriately.  EKG without any significant changes and patient not complaining of any chest pain.  Seen by cardiology and they are not concerned about this.  When I saw him on the morning of 11/29/2019, he did not have any chest pain.  However I was called later by nurse that he was having some chest pain.  EKG repeated did not show any changes.  We ordered another troponin which was 197, decreasing from the previous.  D-dimer was obtained which was elevated raising concern for possible PE.  CT angiogram stat was ordered and it was positive for acute pulmonary embolism with a large occlusive thrombus within the left main pulmonary artery occluding the left lower lobe pulmonary arterial branches. Small nonocclusive thrombus in the right middle lobe segmental branches. No evidence of right heart strain. patient was started on heparin IV right away.  Since he was hemodynamically stable so there was no indication for thrombectomy.  He continues to improve.  No  more chest pain or abdominal pain today.  No abdominal tenderness today.  Cardiology recommends Coumadin instead of NOACs.  I have consulted pharmacy to switch him to Lovenox as well as starting him on Coumadin tonight.  Abdominal tenderness/large hiatal hernia/ ?  Possible acute colitis: Due to him complaining of abdominal pain and abdominal tenderness on exam,  initially concern was for possible ileus.  Abdominal x-ray was obtained which did not show obstruction.  CT of the abdomen and pelvis with contrast was obtained which shows large hiatal hernia with the entire stomach herniated into the lower chest.  No diverticulitis but mild wall thickening of transverse colon.  Based on the exams finding which had upper abdominal tenderness, I think this is possible colitis and should be treated and thus he was started on Zosyn which I will continue.  Leukocytosis improved.  He remains afebrile.  Hypernatremia: Acute but mild and resolved.  Prolonged QT interval: Acute.  QTC elevated at 538. -Recheck EKG in a.m. -Hold QT prolonging medication  Systolic congestive heart failure: Patient appears to be hypovolemic at this time.  EF noted to be 20-25% on 12/7.  BMP initially had been 2174.  Seen by cardiology.  No concerns expressed.  Looks dry.  Torsemide initially was held due to AKI and dehydration.  He was started on torsemide half dose which will be 20 mg p.o. daily on 11/30/2019.  Continue that.   Essential hypertension: On admission patient's blood pressures were noted to be soft 90/72.  It appears after being discharged from the hospital last there were several changes made including discontinuation of 12.5 mg of metoprolol succinate and losartan changed to 25 mg twice daily.  His blood pressure continues to be soft despite of holding antihypertensives.  We will continue to hold them and watch closely.    Recent CVA: Patient was recently hospitalized for distal basal artery occlusion with thready flow to the right PCA requiring interventional radiology for thrombectomy. -Continue Plavix  -Dysphagia 3 diet -Aspiration precautions  Normocytic anemia: Hemoglobin stable.  Anxiety -Held Lexapro due to prolonged QT  -Continue Xanax as needed  Hypothyroidism: TSH 2.833 on 11/27. -Continue levothyroxine  Hyperlipidemia -Continue Crestor   DVT  prophylaxis: Lovenox Code Status: DNR present on admission Family Communication:  None present at bedside.  Called his daughter and updated her about PE and rest of the imaging studies and plan of care on 11/30/2019.  She was appreciative for the call. Disposition Plan: May discharge to SNF tomorrow.  Estimated body mass index is 22.17 kg/m as calculated from the following:   Height as of this encounter: 5\' 6"  (1.676 m).   Weight as of this encounter: 62.3 kg.      Nutritional status:               Consultants:   Cardiology  Procedures:   None  Antimicrobials:   Zosyn today   Subjective: Seen and examined.  No chest pain today.  No abdominal pain.  No other complaint but looks weak.  Objective: Vitals:   12/01/19 0436 12/01/19 0650 12/01/19 0920 12/01/19 1206  BP: (!) 89/76 106/67 (!) 109/50 103/82  Pulse: 63 (!) 125 (!) 121 (!) 118  Resp: 19  18 18   Temp: 97.9 F (36.6 C)  (!) 97.2 F (36.2 C) 97.6 F (36.4 C)  TempSrc: Oral  Oral Oral  SpO2: 100%  100% 100%  Weight:      Height:        Intake/Output  Summary (Last 24 hours) at 12/01/2019 1252 Last data filed at 12/01/2019 0900 Gross per 24 hour  Intake 2089.81 ml  Output 2000 ml  Net 89.81 ml   Filed Weights   11/29/19 0426 11/30/19 0600 12/01/19 0155  Weight: 63.4 kg 63.8 kg 62.3 kg    Examination:  General exam: Appears calm and comfortable  Respiratory system: Clear to auscultation. Respiratory effort normal. Cardiovascular system: S1 & S2 heard, RRR. No JVD, murmurs, rubs, gallops or clicks. No pedal edema. Gastrointestinal system: Abdomen is nondistended, soft and nontender. No organomegaly or masses felt. Normal bowel sounds heard. Central nervous system: Alert and oriented. No focal neurological deficits. Extremities: Symmetric 5 x 5 power. Skin: No rashes, lesions or ulcers.  Psychiatry: Judgement and insight appear poor, mood & affect flat.   Data Reviewed: I have personally  reviewed following labs and imaging studies  CBC: Recent Labs  Lab 11/28/19 1320 11/29/19 0930 11/30/19 0215 12/01/19 0738  WBC 10.7* 12.3* 9.7 9.7  NEUTROABS 8.7* 9.9*  --   --   HGB 12.0* 11.8* 9.9* 11.3*  HCT 39.2 39.0 31.3* 34.9*  MCV 99.5 98.5 95.7 92.6  PLT 370 370 289 [id number]   Basic Metabolic Panel: Recent Labs  Lab 11/28/19 1320 11/29/19 0439 11/29/19 0930 11/29/19 1316 11/30/19 1033 12/01/19 0738  NA 147*  --  144 142 138 140  K 3.5  --  3.6 3.2* 4.1 3.0*  CL 109  --  106 104 108 103  CO2 18*  --  21* 23 20* 26  GLUCOSE 170*  --  173* 158* 165* 157*  BUN 32*  --  24* 23 18 20   CREATININE 1.28*  --  1.46* 0.97 0.77 0.92  CALCIUM 9.1  --  9.0 8.9 8.7* 8.1*  MG  --  1.8  --   --   --   --    GFR: Estimated Creatinine Clearance: 48 mL/min (by C-G formula based on SCr of 0.92 mg/dL). Liver Function Tests: Recent Labs  Lab 11/28/19 1320  AST 16  ALT 14  ALKPHOS 82  BILITOT 1.2  PROT 6.5  ALBUMIN 2.9*   No results for input(s): LIPASE, AMYLASE in the last 168 hours. No results for input(s): AMMONIA in the last 168 hours. Coagulation Profile: No results for input(s): INR, PROTIME in the last 168 hours. Cardiac Enzymes: No results for input(s): CKTOTAL, CKMB, CKMBINDEX, TROPONINI in the last 168 hours. BNP (last 3 results) No results for input(s): PROBNP in the last 8760 hours. HbA1C: No results for input(s): HGBA1C in the last 72 hours. CBG: Recent Labs  Lab 11/28/19 2216 11/29/19 0622  GLUCAP 138* 159*   Lipid Profile: No results for input(s): CHOL, HDL, LDLCALC, TRIG, CHOLHDL, LDLDIRECT in the last 72 hours. Thyroid Function Tests: No results for input(s): TSH, T4TOTAL, FREET4, T3FREE, THYROIDAB in the last 72 hours. Anemia Panel: No results for input(s): VITAMINB12, FOLATE, FERRITIN, TIBC, IRON, RETICCTPCT in the last 72 hours. Sepsis Labs: Recent Labs  Lab 11/29/19 1316  LATICACIDVEN 1.5    Recent Results (from the past 240 hour(s))  C  difficile quick scan w PCR reflex     Status: None   Collection Time: 11/23/19  6:10 AM   Specimen: STOOL  Result Value Ref Range Status   C Diff antigen NEGATIVE NEGATIVE Final   C Diff toxin NEGATIVE NEGATIVE Final   C Diff interpretation No C. difficile detected.  Final    Comment: Performed at [facility],  [address], [place], [place] [address]  Respiratory Panel by RT PCR (Flu A&B, Covid) -     Status: None   Collection Time: 11/23/19  1:18 PM  Result Value Ref Range Status   SARS Coronavirus 2 by RT PCR NEGATIVE NEGATIVE Final    Comment: (NOTE) SARS-CoV-2 target nucleic acids are NOT DETECTED. The SARS-CoV-2 RNA is generally detectable in upper respiratoy specimens during the acute phase of infection. The lowest concentration of SARS-CoV-2 viral copies this assay can detect is 131 copies/mL. A negative result does not preclude SARS-Cov-2 infection and should not be used as the sole basis for treatment or other patient management decisions. A negative result may occur with  improper specimen collection/handling, submission of specimen other than nasopharyngeal swab, presence of viral mutation(s) within the areas targeted by this assay, and inadequate number of viral copies (<131 copies/mL). A negative result must be combined with clinical observations, patient history, and epidemiological information. The expected result is Negative. Fact Sheet for Patients:  PinkCheek.be Fact Sheet for Healthcare Providers:  GravelBags.it This test is not yet ap proved or cleared by the [place] FDA and  has been authorized for detection and/or diagnosis of SARS-CoV-2 by FDA under an Emergency Use Authorization (EUA). This EUA will remain  in effect (meaning this test can be used) for the duration of the COVID-19 declaration under Section 564(b)(1) of the Act, 21 U.S.C. section 360bbb-3(b)(1), unless the authorization  is terminated or revoked sooner.    Influenza A by PCR NEGATIVE NEGATIVE Final   Influenza B by PCR NEGATIVE NEGATIVE Final    Comment: (NOTE) The Xpert Xpress SARS-CoV-2/FLU/RSV assay is intended as an aid in  the diagnosis of influenza from Nasopharyngeal swab specimens and  should not be used as a sole basis for treatment. Nasal washings and  aspirates are unacceptable for Xpert Xpress SARS-CoV-2/FLU/RSV  testing. Fact Sheet for Patients: PinkCheek.be Fact Sheet for Healthcare Providers: GravelBags.it This test is not yet approved or cleared by the [place] FDA and  has been authorized for detection and/or diagnosis of SARS-CoV-2 by  FDA under an Emergency Use Authorization (EUA). This EUA will remain  in effect (meaning this test can be used) for the duration of the  Covid-19 declaration under Section 564(b)(1) of the Act, 21  U.S.C. section 360bbb-3(b)(1), unless the authorization is  terminated or revoked. Performed at [facility], [place] [address], [place], [place] [address]   SARS CORONAVIRUS 2 (TAT 6-24 HRS) Nasopharyngeal Nasopharyngeal Swab     Status: None   Collection Time: 11/28/19  6:30 PM   Specimen: Nasopharyngeal Swab  Result Value Ref Range Status   SARS Coronavirus 2 NEGATIVE NEGATIVE Final    Comment: (NOTE) SARS-CoV-2 target nucleic acids are NOT DETECTED. The SARS-CoV-2 RNA is generally detectable in upper and lower respiratory specimens during the acute phase of infection. Negative results do not preclude SARS-CoV-2 infection, do not rule out co-infections with other pathogens, and should not be used as the sole basis for treatment or other patient management decisions. Negative results must be combined with clinical observations, patient history, and epidemiological information. The expected result is Negative. Fact Sheet for Patients: SugarRoll.be Fact  Sheet for Healthcare Providers: https://www.woods-mathews.com/ This test is not yet approved or cleared by the [place] FDA and  has been authorized for detection and/or diagnosis of SARS-CoV-2 by FDA under an Emergency Use Authorization (EUA). This EUA will remain  in effect (meaning this test can be used)  for the duration of the COVID-19 declaration under Section 56 4(b)(1) of the Act, 21 U.S.C. section 360bbb-3(b)(1), unless the authorization is terminated or revoked sooner. Performed at [facility], [place] [address], [place], [place] [address]       Radiology Studies: CT ANGIO CHEST PE W OR WO CONTRAST  Result Date: 11/29/2019 CLINICAL DATA:  Lethargy, weakness and left upper quadrant abdominal pain. Elevated troponin levels. Possible pulmonary embolism. EXAM: CT ANGIOGRAPHY CHEST CT ABDOMEN AND PELVIS WITH CONTRAST TECHNIQUE: Multidetector CT imaging of the chest was performed using the standard protocol during bolus administration of intravenous contrast. Multiplanar CT image reconstructions and MIPs were obtained to evaluate the vascular anatomy. Multidetector CT imaging of the abdomen and pelvis was performed using the standard protocol during bolus administration of intravenous contrast. CONTRAST:  179mL OMNIPAQUE IOHEXOL 350 MG/ML SOLN COMPARISON:  Abdominal radiographs 11/29/2019. Chest radiographs 11/28/2019. Chest CTA 05/04/2010. Abdominal CTA 11/13/2019. FINDINGS: CTA CHEST FINDINGS Cardiovascular: The pulmonary arteries are well opacified with contrast to the level of the subsegmental branches. There is a large occlusive thrombus within the left pulmonary artery, measuring up to 4.5 cm on image 55/1. This occludes the left lower lobe pulmonary arterial branches. The left upper lobe branches are patent. On the right, there is a small nonocclusive thrombus within the right middle lobe segmental branches (image 78/1). There is diffuse atherosclerosis of the aorta,  great vessels and coronary arteries post median sternotomy and CABG. There is limited opacification of the systemic arteries. The heart is mildly enlarged. The right ventricle to left ventricle ratio is 0.84 (Within normal limits). No significant pericardial effusion. Mediastinum/Nodes: There are no enlarged mediastinal, hilar or axillary lymph nodes.A very large hiatal hernia is again noted with the entire stomach herniated into the lower chest. The trachea and thyroid gland appear unremarkable. Lungs/Pleura: There is no significant pleural effusion or pneumothorax. There is chronic left lower lobe atelectasis adjacent to the hiatal hernia with increased soft tissue thickening around the left lower lobe bronchi. There is no well-defined mass. Scattered calcified granulomas are present. Musculoskeletal/Chest wall: There is no chest wall mass or suspicious osseous finding. Previous median sternotomy. Review of the MIP images confirms the above findings. CT ABDOMEN and PELVIS FINDINGS Hepatobiliary: There are stable cysts within the right hepatic lobe. No suspicious hepatic findings. No evidence of gallstones, gallbladder wall thickening or biliary dilatation. Pancreas: Stable diffuse pancreatic atrophy with scattered parenchymal calcifications consistent with chronic pancreatitis. There is a 10 mm cystic lesion in the pancreatic body on image 22/5 which appears unchanged. No pancreatic ductal dilatation or surrounding inflammation. Spleen: Normal in size without focal abnormality. Adrenals/Urinary Tract: The adrenal glands appear stable without suspicious findings. Nonobstructing 6 mm calculus in the lower pole of the right kidney on image 36/5. No other urinary tract calculi. There is no evidence of renal mass or hydronephrosis. The bladder appears normal. Stomach/Bowel: As above, there is a large hiatal hernia. No significant abnormalities of the small bowel or appendix are seen. There are diverticular changes  throughout the colon. There is mild wall thickening of the transverse colon which appears similar to the previous study. No surrounding inflammatory changes. Vascular/Lymphatic: There are no enlarged abdominal or pelvic lymph nodes. Diffuse aortic and branch vessel atherosclerosis. Possible nonocclusive thrombus in the right femoral vein. Reproductive: The prostate gland and seminal vesicles appear normal. Other: Postsurgical changes within the anterior abdominal wall. No residual hernia identified. There is no ascites or free air. Musculoskeletal: No acute or  significant osseous findings. Chronic deformity of the left femoral neck appears unchanged. There is multilevel lumbar spondylosis. Review of the MIP images confirms the above findings. IMPRESSION: 1. Study is positive for acute pulmonary embolism with a large occlusive thrombus within the left main pulmonary artery occluding the left lower lobe pulmonary arterial branches. Small nonocclusive thrombus in the right middle lobe segmental branches. No evidence of right heart strain. 2. Increased left lower lobe perihilar opacity may reflect atelectasis or infarct. 3. No acute findings within the abdomen or pelvis. 4. Possible nonocclusive thrombus in the right femoral vein. 5. Large hiatal hernia with the entire stomach herniated into the lower chest. Colonic diverticulosis and mild wall thickening of the transverse colon, similar to recent prior CT. 6. Sequela of chronic pancreatitis with stable small cystic lesion in the pancreatic body. 7. Aortic Atherosclerosis (ICD10-I70.0). 8. Critical Value/emergent results were called by telephone at the time of interpretation on 11/29/2019 at 5:47 pm to [place] , who verbally acknowledged these results. Electronically Signed   By: Richardean Sale M.D.   On: 11/29/2019 17:56   CT ABDOMEN PELVIS W CONTRAST  Result Date: 11/29/2019 CLINICAL DATA:  Lethargy, weakness and left upper quadrant abdominal pain.  Elevated troponin levels. Possible pulmonary embolism. EXAM: CT ANGIOGRAPHY CHEST CT ABDOMEN AND PELVIS WITH CONTRAST TECHNIQUE: Multidetector CT imaging of the chest was performed using the standard protocol during bolus administration of intravenous contrast. Multiplanar CT image reconstructions and MIPs were obtained to evaluate the vascular anatomy. Multidetector CT imaging of the abdomen and pelvis was performed using the standard protocol during bolus administration of intravenous contrast. CONTRAST:  143mL OMNIPAQUE IOHEXOL 350 MG/ML SOLN COMPARISON:  Abdominal radiographs 11/29/2019. Chest radiographs 11/28/2019. Chest CTA 05/04/2010. Abdominal CTA 11/13/2019. FINDINGS: CTA CHEST FINDINGS Cardiovascular: The pulmonary arteries are well opacified with contrast to the level of the subsegmental branches. There is a large occlusive thrombus within the left pulmonary artery, measuring up to 4.5 cm on image 55/1. This occludes the left lower lobe pulmonary arterial branches. The left upper lobe branches are patent. On the right, there is a small nonocclusive thrombus within the right middle lobe segmental branches (image 78/1). There is diffuse atherosclerosis of the aorta, great vessels and coronary arteries post median sternotomy and CABG. There is limited opacification of the systemic arteries. The heart is mildly enlarged. The right ventricle to left ventricle ratio is 0.84 (Within normal limits). No significant pericardial effusion. Mediastinum/Nodes: There are no enlarged mediastinal, hilar or axillary lymph nodes.A very large hiatal hernia is again noted with the entire stomach herniated into the lower chest. The trachea and thyroid gland appear unremarkable. Lungs/Pleura: There is no significant pleural effusion or pneumothorax. There is chronic left lower lobe atelectasis adjacent to the hiatal hernia with increased soft tissue thickening around the left lower lobe bronchi. There is no well-defined mass.  Scattered calcified granulomas are present. Musculoskeletal/Chest wall: There is no chest wall mass or suspicious osseous finding. Previous median sternotomy. Review of the MIP images confirms the above findings. CT ABDOMEN and PELVIS FINDINGS Hepatobiliary: There are stable cysts within the right hepatic lobe. No suspicious hepatic findings. No evidence of gallstones, gallbladder wall thickening or biliary dilatation. Pancreas: Stable diffuse pancreatic atrophy with scattered parenchymal calcifications consistent with chronic pancreatitis. There is a 10 mm cystic lesion in the pancreatic body on image 22/5 which appears unchanged. No pancreatic ductal dilatation or surrounding inflammation. Spleen: Normal in size without focal abnormality. Adrenals/Urinary Tract: The adrenal glands appear stable  without suspicious findings. Nonobstructing 6 mm calculus in the lower pole of the right kidney on image 36/5. No other urinary tract calculi. There is no evidence of renal mass or hydronephrosis. The bladder appears normal. Stomach/Bowel: As above, there is a large hiatal hernia. No significant abnormalities of the small bowel or appendix are seen. There are diverticular changes throughout the colon. There is mild wall thickening of the transverse colon which appears similar to the previous study. No surrounding inflammatory changes. Vascular/Lymphatic: There are no enlarged abdominal or pelvic lymph nodes. Diffuse aortic and branch vessel atherosclerosis. Possible nonocclusive thrombus in the right femoral vein. Reproductive: The prostate gland and seminal vesicles appear normal. Other: Postsurgical changes within the anterior abdominal wall. No residual hernia identified. There is no ascites or free air. Musculoskeletal: No acute or significant osseous findings. Chronic deformity of the left femoral neck appears unchanged. There is multilevel lumbar spondylosis. Review of the MIP images confirms the above findings.  IMPRESSION: 1. Study is positive for acute pulmonary embolism with a large occlusive thrombus within the left main pulmonary artery occluding the left lower lobe pulmonary arterial branches. Small nonocclusive thrombus in the right middle lobe segmental branches. No evidence of right heart strain. 2. Increased left lower lobe perihilar opacity may reflect atelectasis or infarct. 3. No acute findings within the abdomen or pelvis. 4. Possible nonocclusive thrombus in the right femoral vein. 5. Large hiatal hernia with the entire stomach herniated into the lower chest. Colonic diverticulosis and mild wall thickening of the transverse colon, similar to recent prior CT. 6. Sequela of chronic pancreatitis with stable small cystic lesion in the pancreatic body. 7. Aortic Atherosclerosis (ICD10-I70.0). 8. Critical Value/emergent results were called by telephone at the time of interpretation on 11/29/2019 at 5:47 pm to [place] , who verbally acknowledged these results. Electronically Signed   By: Richardean Sale M.D.   On: 11/29/2019 17:56   VAS [place] LOWER EXTREMITY VENOUS (DVT)  Result Date: 11/30/2019  Lower Venous Study Indications: Pulmonary embolism.  Comparison Study: no prior Performing Technologist: June Leap RDMS, RVT  Examination Guidelines: A complete evaluation includes B-mode imaging, spectral Doppler, color Doppler, and power Doppler as needed of all accessible portions of each vessel. Bilateral testing is considered an integral part of a complete examination. Limited examinations for reoccurring indications may be performed as noted.  +---------+---------------+---------+-----------+----------+--------------+ RIGHT    CompressibilityPhasicitySpontaneityPropertiesThrombus Aging +---------+---------------+---------+-----------+----------+--------------+ CFV      Full           Yes      Yes                                  +---------+---------------+---------+-----------+----------+--------------+ SFJ      Full                                                        +---------+---------------+---------+-----------+----------+--------------+ FV Prox  Full                                                        +---------+---------------+---------+-----------+----------+--------------+ FV Mid   Full                                                        +---------+---------------+---------+-----------+----------+--------------+  FV DistalFull                                                        +---------+---------------+---------+-----------+----------+--------------+ PFV      Full                                                        +---------+---------------+---------+-----------+----------+--------------+ POP      Full           Yes      Yes                                 +---------+---------------+---------+-----------+----------+--------------+ PTV      Full                                                        +---------+---------------+---------+-----------+----------+--------------+ PERO     Full                                                        +---------+---------------+---------+-----------+----------+--------------+   +---------+---------------+---------+-----------+----------+--------------+ LEFT     CompressibilityPhasicitySpontaneityPropertiesThrombus Aging +---------+---------------+---------+-----------+----------+--------------+ CFV      Full           Yes      Yes                                 +---------+---------------+---------+-----------+----------+--------------+ SFJ      Full                                                        +---------+---------------+---------+-----------+----------+--------------+ FV Prox  Full                                                         +---------+---------------+---------+-----------+----------+--------------+ FV Mid   Full                                                        +---------+---------------+---------+-----------+----------+--------------+ FV DistalFull                                                        +---------+---------------+---------+-----------+----------+--------------+   PFV      Full                                                        +---------+---------------+---------+-----------+----------+--------------+ POP      Full           Yes      Yes                                 +---------+---------------+---------+-----------+----------+--------------+ PTV      Full                                                        +---------+---------------+---------+-----------+----------+--------------+ PERO     Full                                                        +---------+---------------+---------+-----------+----------+--------------+     Summary: Right: There is no evidence of deep vein thrombosis in the lower extremity. No cystic structure found in the popliteal fossa. Left: There is no evidence of deep vein thrombosis in the lower extremity. No cystic structure found in the popliteal fossa.  *See table(s) above for measurements and observations. Electronically signed by Monica Martinez MD on 11/30/2019 at 1:43:32 PM.    Final     Scheduled Meds: . alum & mag hydroxide-simeth  30 mL Oral Once  . amiodarone  400 mg Oral BID  . clopidogrel  75 mg Oral Daily  . ferrous sulfate  325 mg Oral Q breakfast  . latanoprost  1 drop Both Eyes QHS  . levothyroxine  50 mcg Oral Q0600  . potassium chloride  40 mEq Oral BID  . rosuvastatin  20 mg Oral q1800  . sodium chloride flush  3 mL Intravenous Q12H  . torsemide  20 mg Oral Daily  . vitamin B-12  500 mcg Oral Daily   Continuous Infusions: . ceFEPime (MAXIPIME) IV 2 g (12/01/19 0854)  . dextrose 50 mL/hr at 11/30/19  1951  . heparin 1,000 Units/hr (12/01/19 0854)  . metronidazole 500 mg (12/01/19 0615)     LOS: 2 days   Time spent: 30 minutes   Darliss Cheney, MD [facility]  12/01/2019, 12:52 PM   To contact the attending provider between 7A-7P or the covering provider during after hours 7P-7A, please log into the web site [email or web address] and use password [email or web address].

## 2019-12-01 NOTE — Progress Notes (Addendum)
Kenneth [name] for enoxaparin and warfarin Indication: Pulmonary Embolus  Allergies  Allergen Reactions  . Lasix [Furosemide] Other (See Comments)    aches  . Spironolactone Other (See Comments)    Red, puffy, eyes, almost hospitalized  . Aspirin Other (See Comments)    Bleeding.    . Lipitor [Atorvastatin] Other (See Comments)    tired  . Lopressor [Metoprolol Tartrate] Other (See Comments)    Sleepy all the time 11.27.2020 Patient is currently taking Lopressor.  [name] Penicillins Hives and Rash    Has patient had a PCN reaction causing immediate rash, facial/tongue/throat swelling, SOB or lightheadedness with hypotension: No Has patient had a PCN reaction causing severe rash involving mucus membranes or skin necrosis: No Has patient had a PCN reaction that required hospitalization No Has patient had a PCN reaction occurring within the last 10 years: Yes If all of the above answers are "NO", then may proceed with Cephalosporin use.     Patient Measurements: Height: 5\' 6"  (167.6 cm) Weight: 137 lb 5.6 oz (62.3 kg) IBW/kg (Calculated) : 63.8 Heparin Dosing Weight: 63.4 kg  Vital Signs: Temp: 97.6 F (36.4 C) (12/26 1206) Temp Source: Oral (12/26 1206) BP: 103/82 (12/26 1206) Pulse Rate: 118 (12/26 1206)  Labs: Recent Labs    11/28/19 1500 11/29/19 0930 11/29/19 0930 11/29/19 1316 11/30/19 0215 11/30/19 1033 11/30/19 2106 12/01/19 0738  HGB  --  11.8*  --   --  9.9*  --   --  11.3*  HCT  --  39.0  --   --  31.3*  --   --  34.9*  PLT  --  370  --   --  289  --   --  281  HEPARINUNFRC  --   --    < >  --  0.41 0.27* 0.39 0.29*  CREATININE  --  1.46*  --  0.97  --  0.77  --  0.92  TROPONINIHS 220*  --   --  197*  --   --   --   --    < > = values in this interval not displayed.    Estimated Creatinine Clearance: 48 mL/min (by C-G formula based on SCr of 0.92 mg/dL).   Assessment: 85 yr old male admitted on 11/28/19 from rehab for  generalized weakness, nausea and lethargy. 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 Pt was also recently hospitalized (11/19/19) for CVA with distal basal artery occlusion, requiring IR for thrombectomy.  CTA was positive for acute pulmonary embolism with a large occlusive thrombus within the left main pulmonary artery occluding the left lower lobe pulmonary arterial branches, small nonocclusive thrombus in the right middle lobe segmental branches; no evidence of right heart strain. CT also showed possible nonocclusive thrombus in the right femoral vein. D-dimer is elevated at 2.39; H/H 11.3/34.9, platelets 281 (all stable).   Patient was on no anticoagulants PTA. He was receiving Lovenox 40 mg SQ daily this hospitalization (last dose at 2052 on 11/28/19).  Pharmacy is consulted to transition from heparin to warfarin with an enoxaparin bridge.  No bleeding per RN. Important drug interactions to note with warfarin are amiodarone, metronidazole, and levothyroxine. Metronidazole and amiodarone can decrease warfarin metabolism and lead to an increase in INR.  Goal of Therapy:  Monitor platelets by anticoagulation protocol: Yes   Plan:  Enoxaparin SQ 90 mg daily Warfarin 1 mg daily Monitor  CBC and INR Monitor for signs/symptoms of bleeding  Thank you for involving pharmacy in this patient's care.  Sherren Kerns, PharmD PGY1 Acute Care Pharmacy Resident 12/01/2019 1:23 PM  **Pharmacist phone directory can be found on [email or web address] listed under [place]**

## 2019-12-01 NOTE — Progress Notes (Addendum)
Progress Note  Patient Name: Kenneth [name] Date of Encounter: 12/01/2019  Primary Cardiologist:   Sinclair Grooms, MD   Subjective   Very hard of hearing.  Still complains of chest pain.  Does not report SOB.   Inpatient Medications    Scheduled Meds: . alum & mag hydroxide-simeth  30 mL Oral Once  . clopidogrel  75 mg Oral Daily  . ferrous sulfate  325 mg Oral Q breakfast  . latanoprost  1 drop Both Eyes QHS  . levothyroxine  50 mcg Oral Q0600  . rosuvastatin  20 mg Oral q1800  . sodium chloride flush  3 mL Intravenous Q12H  . torsemide  20 mg Oral Daily  . vitamin B-12  500 mcg Oral Daily   Continuous Infusions: . ceFEPime (MAXIPIME) IV 2 g (12/01/19 0854)  . dextrose 50 mL/hr at 11/30/19 1951  . heparin 1,000 Units/hr (12/01/19 0854)  . metronidazole 500 mg (12/01/19 0615)   PRN Meds: acetaminophen **OR** acetaminophen, albuterol, ALPRAZolam, polyvinyl alcohol   Vital Signs    Vitals:   11/30/19 2038 12/01/19 0155 12/01/19 0436 12/01/19 0650  BP: 104/72 (!) 106/57 (!) 89/76 106/67  Pulse: (!) 125 (!) 114 63 (!) 125  Resp: 20 20 19    Temp: 98.7 F (37.1 C)  97.9 F (36.6 C)   TempSrc: Oral  Oral   SpO2: 94% 100% 100%   Weight:  62.3 kg    Height:        Intake/Output Summary (Last 24 hours) at 12/01/2019 0901 Last data filed at 12/01/2019 0650 Gross per 24 hour  Intake 1969.81 ml  Output 2400 ml  Net -430.19 ml   Filed Weights   11/29/19 0426 11/30/19 0600 12/01/19 0155  Weight: 63.4 kg 63.8 kg 62.3 kg    Telemetry    PAF - Personally Reviewed  ECG    NSR, rate 86, axis WNL, PACs.  - Personally Reviewed  Physical Exam   GEN: No acute distress.   Very frail Neck: No  JVD Cardiac: Irregular RR, no murmurs, rubs, or gallops.  Respiratory: Clear  to auscultation bilaterally. GI: Soft, nontender, non-distended  MS: No  edema; No deformity. Neuro:  Nonfocal  Psych:   confused  Labs    Chemistry Recent Labs  Lab 11/28/19 1320  11/29/19 1316 11/30/19 1033 12/01/19 0738  NA 147* 142 138 140  K 3.5 3.2* 4.1 3.0*  CL 109 104 108 103  CO2 18* 23 20* 26  GLUCOSE 170* 158* 165* 157*  BUN 32* 23 18 20   CREATININE 1.28* 0.97 0.77 0.92  CALCIUM 9.1 8.9 8.7* 8.1*  PROT 6.5  --   --   --   ALBUMIN 2.9*  --   --   --   AST 16  --   --   --   ALT 14  --   --   --   ALKPHOS 82  --   --   --   BILITOT 1.2  --   --   --   GFRNONAA 49* >60 >60 >60  GFRAA 57* >60 >60 >60  ANIONGAP 20* 15 10 11      Hematology Recent Labs  Lab 11/29/19 0930 11/30/19 0215 12/01/19 0738  WBC 12.3* 9.7 9.7  RBC 3.96* 3.27* 3.77*  HGB 11.8* 9.9* 11.3*  HCT 39.0 31.3* 34.9*  MCV 98.5 95.7 92.6  MCH 29.8 30.3 30.0  MCHC 30.3 31.6 32.4  RDW 14.4 14.0 13.6  PLT 370  289 281    Cardiac EnzymesNo results for input(s): TROPONINI in the last 168 hours. No results for input(s): TROPIPOC in the last 168 hours.   BNP Recent Labs  Lab 11/28/19 1320  BNP 1,428.2*     DDimer  Recent Labs  Lab 11/29/19 1316  DDIMER 2.39*     Radiology    DG Abd 1 View  Result Date: 11/29/2019 CLINICAL DATA:  Nausea, diarrhea, left upper quadrant pain EXAM: ABDOMEN - 1 VIEW COMPARISON:  CT 11/13/2019 FINDINGS: Nonobstructive bowel gas pattern. No organomegaly or free air. Right lower pole nephrolithiasis. Aortic and vascular calcifications noted. IMPRESSION: No obstruction or free air. Right lower pole nephrolithiasis. Electronically Signed   By: Rolm Baptise M.D.   On: 11/29/2019 10:45   CT ANGIO CHEST PE W OR WO CONTRAST  Result Date: 11/29/2019 CLINICAL DATA:  Lethargy, weakness and left upper quadrant abdominal pain. Elevated troponin levels. Possible pulmonary embolism. EXAM: CT ANGIOGRAPHY CHEST CT ABDOMEN AND PELVIS WITH CONTRAST TECHNIQUE: Multidetector CT imaging of the chest was performed using the standard protocol during bolus administration of intravenous contrast. Multiplanar CT image reconstructions and MIPs were obtained to evaluate  the vascular anatomy. Multidetector CT imaging of the abdomen and pelvis was performed using the standard protocol during bolus administration of intravenous contrast. CONTRAST:  163mL OMNIPAQUE IOHEXOL 350 MG/ML SOLN COMPARISON:  Abdominal radiographs 11/29/2019. Chest radiographs 11/28/2019. Chest CTA 05/04/2010. Abdominal CTA 11/13/2019. FINDINGS: CTA CHEST FINDINGS Cardiovascular: The pulmonary arteries are well opacified with contrast to the level of the subsegmental branches. There is a large occlusive thrombus within the left pulmonary artery, measuring up to 4.5 cm on image 55/1. This occludes the left lower lobe pulmonary arterial branches. The left upper lobe branches are patent. On the right, there is a small nonocclusive thrombus within the right middle lobe segmental branches (image 78/1). There is diffuse atherosclerosis of the aorta, great vessels and coronary arteries post median sternotomy and CABG. There is limited opacification of the systemic arteries. The heart is mildly enlarged. The right ventricle to left ventricle ratio is 0.84 (Within normal limits). No significant pericardial effusion. Mediastinum/Nodes: There are no enlarged mediastinal, hilar or axillary lymph nodes.A very large hiatal hernia is again noted with the entire stomach herniated into the lower chest. The trachea and thyroid gland appear unremarkable. Lungs/Pleura: There is no significant pleural effusion or pneumothorax. There is chronic left lower lobe atelectasis adjacent to the hiatal hernia with increased soft tissue thickening around the left lower lobe bronchi. There is no well-defined mass. Scattered calcified granulomas are present. Musculoskeletal/Chest wall: There is no chest wall mass or suspicious osseous finding. Previous median sternotomy. Review of the MIP images confirms the above findings. CT ABDOMEN and PELVIS FINDINGS Hepatobiliary: There are stable cysts within the right hepatic lobe. No suspicious hepatic  findings. No evidence of gallstones, gallbladder wall thickening or biliary dilatation. Pancreas: Stable diffuse pancreatic atrophy with scattered parenchymal calcifications consistent with chronic pancreatitis. There is a 10 mm cystic lesion in the pancreatic body on image 22/5 which appears unchanged. No pancreatic ductal dilatation or surrounding inflammation. Spleen: Normal in size without focal abnormality. Adrenals/Urinary Tract: The adrenal glands appear stable without suspicious findings. Nonobstructing 6 mm calculus in the lower pole of the right kidney on image 36/5. No other urinary tract calculi. There is no evidence of renal mass or hydronephrosis. The bladder appears normal. Stomach/Bowel: As above, there is a large hiatal hernia. No significant abnormalities of the small bowel or appendix  are seen. There are diverticular changes throughout the colon. There is mild wall thickening of the transverse colon which appears similar to the previous study. No surrounding inflammatory changes. Vascular/Lymphatic: There are no enlarged abdominal or pelvic lymph nodes. Diffuse aortic and branch vessel atherosclerosis. Possible nonocclusive thrombus in the right femoral vein. Reproductive: The prostate gland and seminal vesicles appear normal. Other: Postsurgical changes within the anterior abdominal wall. No residual hernia identified. There is no ascites or free air. Musculoskeletal: No acute or significant osseous findings. Chronic deformity of the left femoral neck appears unchanged. There is multilevel lumbar spondylosis. Review of the MIP images confirms the above findings. IMPRESSION: 1. Study is positive for acute pulmonary embolism with a large occlusive thrombus within the left main pulmonary artery occluding the left lower lobe pulmonary arterial branches. Small nonocclusive thrombus in the right middle lobe segmental branches. No evidence of right heart strain. 2. Increased left lower lobe perihilar  opacity may reflect atelectasis or infarct. 3. No acute findings within the abdomen or pelvis. 4. Possible nonocclusive thrombus in the right femoral vein. 5. Large hiatal hernia with the entire stomach herniated into the lower chest. Colonic diverticulosis and mild wall thickening of the transverse colon, similar to recent prior CT. 6. Sequela of chronic pancreatitis with stable small cystic lesion in the pancreatic body. 7. Aortic Atherosclerosis (ICD10-I70.0). 8. Critical Value/emergent results were called by telephone at the time of interpretation on 11/29/2019 at 5:47 pm to [place] , who verbally acknowledged these results. Electronically Signed   By: Richardean Sale M.D.   On: 11/29/2019 17:56   CT ABDOMEN PELVIS W CONTRAST  Result Date: 11/29/2019 CLINICAL DATA:  Lethargy, weakness and left upper quadrant abdominal pain. Elevated troponin levels. Possible pulmonary embolism. EXAM: CT ANGIOGRAPHY CHEST CT ABDOMEN AND PELVIS WITH CONTRAST TECHNIQUE: Multidetector CT imaging of the chest was performed using the standard protocol during bolus administration of intravenous contrast. Multiplanar CT image reconstructions and MIPs were obtained to evaluate the vascular anatomy. Multidetector CT imaging of the abdomen and pelvis was performed using the standard protocol during bolus administration of intravenous contrast. CONTRAST:  146mL OMNIPAQUE IOHEXOL 350 MG/ML SOLN COMPARISON:  Abdominal radiographs 11/29/2019. Chest radiographs 11/28/2019. Chest CTA 05/04/2010. Abdominal CTA 11/13/2019. FINDINGS: CTA CHEST FINDINGS Cardiovascular: The pulmonary arteries are well opacified with contrast to the level of the subsegmental branches. There is a large occlusive thrombus within the left pulmonary artery, measuring up to 4.5 cm on image 55/1. This occludes the left lower lobe pulmonary arterial branches. The left upper lobe branches are patent. On the right, there is a small nonocclusive thrombus  within the right middle lobe segmental branches (image 78/1). There is diffuse atherosclerosis of the aorta, great vessels and coronary arteries post median sternotomy and CABG. There is limited opacification of the systemic arteries. The heart is mildly enlarged. The right ventricle to left ventricle ratio is 0.84 (Within normal limits). No significant pericardial effusion. Mediastinum/Nodes: There are no enlarged mediastinal, hilar or axillary lymph nodes.A very large hiatal hernia is again noted with the entire stomach herniated into the lower chest. The trachea and thyroid gland appear unremarkable. Lungs/Pleura: There is no significant pleural effusion or pneumothorax. There is chronic left lower lobe atelectasis adjacent to the hiatal hernia with increased soft tissue thickening around the left lower lobe bronchi. There is no well-defined mass. Scattered calcified granulomas are present. Musculoskeletal/Chest wall: There is no chest wall mass or suspicious osseous finding. Previous median sternotomy. Review of the MIP  images confirms the above findings. CT ABDOMEN and PELVIS FINDINGS Hepatobiliary: There are stable cysts within the right hepatic lobe. No suspicious hepatic findings. No evidence of gallstones, gallbladder wall thickening or biliary dilatation. Pancreas: Stable diffuse pancreatic atrophy with scattered parenchymal calcifications consistent with chronic pancreatitis. There is a 10 mm cystic lesion in the pancreatic body on image 22/5 which appears unchanged. No pancreatic ductal dilatation or surrounding inflammation. Spleen: Normal in size without focal abnormality. Adrenals/Urinary Tract: The adrenal glands appear stable without suspicious findings. Nonobstructing 6 mm calculus in the lower pole of the right kidney on image 36/5. No other urinary tract calculi. There is no evidence of renal mass or hydronephrosis. The bladder appears normal. Stomach/Bowel: As above, there is a large hiatal  hernia. No significant abnormalities of the small bowel or appendix are seen. There are diverticular changes throughout the colon. There is mild wall thickening of the transverse colon which appears similar to the previous study. No surrounding inflammatory changes. Vascular/Lymphatic: There are no enlarged abdominal or pelvic lymph nodes. Diffuse aortic and branch vessel atherosclerosis. Possible nonocclusive thrombus in the right femoral vein. Reproductive: The prostate gland and seminal vesicles appear normal. Other: Postsurgical changes within the anterior abdominal wall. No residual hernia identified. There is no ascites or free air. Musculoskeletal: No acute or significant osseous findings. Chronic deformity of the left femoral neck appears unchanged. There is multilevel lumbar spondylosis. Review of the MIP images confirms the above findings. IMPRESSION: 1. Study is positive for acute pulmonary embolism with a large occlusive thrombus within the left main pulmonary artery occluding the left lower lobe pulmonary arterial branches. Small nonocclusive thrombus in the right middle lobe segmental branches. No evidence of right heart strain. 2. Increased left lower lobe perihilar opacity may reflect atelectasis or infarct. 3. No acute findings within the abdomen or pelvis. 4. Possible nonocclusive thrombus in the right femoral vein. 5. Large hiatal hernia with the entire stomach herniated into the lower chest. Colonic diverticulosis and mild wall thickening of the transverse colon, similar to recent prior CT. 6. Sequela of chronic pancreatitis with stable small cystic lesion in the pancreatic body. 7. Aortic Atherosclerosis (ICD10-I70.0). 8. Critical Value/emergent results were called by telephone at the time of interpretation on 11/29/2019 at 5:47 pm to [place] , who verbally acknowledged these results. Electronically Signed   By: Richardean Sale M.D.   On: 11/29/2019 17:56   VAS [place] LOWER EXTREMITY  VENOUS (DVT)  Result Date: 11/30/2019  Lower Venous Study Indications: Pulmonary embolism.  Comparison Study: no prior Performing Technologist: June Leap RDMS, RVT  Examination Guidelines: A complete evaluation includes B-mode imaging, spectral Doppler, color Doppler, and power Doppler as needed of all accessible portions of each vessel. Bilateral testing is considered an integral part of a complete examination. Limited examinations for reoccurring indications may be performed as noted.  +---------+---------------+---------+-----------+----------+--------------+ RIGHT    CompressibilityPhasicitySpontaneityPropertiesThrombus Aging +---------+---------------+---------+-----------+----------+--------------+ CFV      Full           Yes      Yes                                 +---------+---------------+---------+-----------+----------+--------------+ SFJ      Full                                                        +---------+---------------+---------+-----------+----------+--------------+  FV Prox  Full                                                        +---------+---------------+---------+-----------+----------+--------------+ FV Mid   Full                                                        +---------+---------------+---------+-----------+----------+--------------+ FV DistalFull                                                        +---------+---------------+---------+-----------+----------+--------------+ PFV      Full                                                        +---------+---------------+---------+-----------+----------+--------------+ POP      Full           Yes      Yes                                 +---------+---------------+---------+-----------+----------+--------------+ PTV      Full                                                        +---------+---------------+---------+-----------+----------+--------------+ PERO      Full                                                        +---------+---------------+---------+-----------+----------+--------------+   +---------+---------------+---------+-----------+----------+--------------+ LEFT     CompressibilityPhasicitySpontaneityPropertiesThrombus Aging +---------+---------------+---------+-----------+----------+--------------+ CFV      Full           Yes      Yes                                 +---------+---------------+---------+-----------+----------+--------------+ SFJ      Full                                                        +---------+---------------+---------+-----------+----------+--------------+ FV Prox  Full                                                        +---------+---------------+---------+-----------+----------+--------------+  FV Mid   Full                                                        +---------+---------------+---------+-----------+----------+--------------+ FV DistalFull                                                        +---------+---------------+---------+-----------+----------+--------------+ PFV      Full                                                        +---------+---------------+---------+-----------+----------+--------------+ POP      Full           Yes      Yes                                 +---------+---------------+---------+-----------+----------+--------------+ PTV      Full                                                        +---------+---------------+---------+-----------+----------+--------------+ PERO     Full                                                        +---------+---------------+---------+-----------+----------+--------------+     Summary: Right: There is no evidence of deep vein thrombosis in the lower extremity. No cystic structure found in the popliteal fossa. Left: There is no evidence of deep vein thrombosis in the lower  extremity. No cystic structure found in the popliteal fossa.  *See table(s) above for measurements and observations. Electronically signed by Monica Martinez MD on 11/30/2019 at 1:43:32 PM.    Final     Cardiac Studies   ECHO 11/19/19  1. Left ventricular ejection fraction, by visual estimation, is 20 to 25%. The left ventricle has normal function. There is no left ventricular hypertrophy.  2. Left ventricular diastolic parameters are indeterminate.  3. The left ventricle demonstrates global hypokinesis.  4. COmpared to previous echo on 11/12/19, LVEF is unchanged.  5. Global right ventricle mild to moderately reduced..The right ventricular size is normal. No increase in right ventricular wall thickness.  6. Left atrial size was moderately dilated.  7. Right atrial size was normal.  8. The mitral valve is grossly normal. Moderate mitral valve regurgitation.  9. The tricuspid valve is normal in structure. Tricuspid valve regurgitation is trivial. 10. The aortic valve is tricuspid. Aortic valve regurgitation is not visualized. Mild aortic valve stenosis. 11. The pulmonic valve was normal in structure. Pulmonic valve regurgitation is not visualized. 12. The aortic root was not well visualized. 13. The inferior  vena cava is normal in size with greater than 50% respiratory variability, suggesting right atrial pressure of 3 mmHg.  Patient Profile     85 y.o. 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 Patient presents from rehab for generalized weakness, nausea, and lethargy started last night. Patient has had 2 hospitalizations at this month after suffering a STEMI and thereafter readmitted after having a stroke. Cardiology was consulted secondary to an abnormal EKG.   Found to have an acute PE.    Assessment & Plan      CAD:  Not thought to have any acute ischemic process.   He had a recent PCI with extensive thrombectomy to coronaries and should remain on Plavix.  Now with need for treatment of PE he will be an increased risk for bleeding particularly GI.  I might suggest warfarin which would be easily reversible in the face of bleeding.  Currently on heparin.  No ASA.     CHRONIC SYSTOLIC HF:  Unable to give his previous afterload reduction with hypotension.  Will follow BPs and assist with med titration.    HTN:   Hypotensive with acute illness.    ATRIAL FIB:  PAF.  Start amiodarone 400 mg PO bid.  QT OK.    HYPOKALEMIA:  Will supplement with PO.     For questions or updates, please contact [place] Please consult [email or web address] for contact info under Cardiology/STEMI.   Signed, Minus Breeding, MD  12/01/2019, 9:01 AM

## 2019-12-01 NOTE — Progress Notes (Signed)
[place] for Heparin Indication: Pulmonary Embolus  Allergies  Allergen Reactions  . Lasix [Furosemide] Other (See Comments)    aches  . Spironolactone Other (See Comments)    Red, puffy, eyes, almost hospitalized  . Aspirin Other (See Comments)    Bleeding.    . Lipitor [Atorvastatin] Other (See Comments)    tired  . Lopressor [Metoprolol Tartrate] Other (See Comments)    Sleepy all the time 11.27.2020 Patient is currently taking Lopressor.  [name] Penicillins Hives and Rash    Has patient had a PCN reaction causing immediate rash, facial/tongue/throat swelling, SOB or lightheadedness with hypotension: No Has patient had a PCN reaction causing severe rash involving mucus membranes or skin necrosis: No Has patient had a PCN reaction that required hospitalization No Has patient had a PCN reaction occurring within the last 10 years: Yes If all of the above answers are "NO", then may proceed with Cephalosporin use.     Patient Measurements: Height: 5\' 6"  (167.6 cm) Weight: 137 lb 5.6 oz (62.3 kg) IBW/kg (Calculated) : 63.8 Heparin Dosing Weight: 63.4 kg  Vital Signs: Temp: 97.9 F (36.6 C) (12/26 0436) Temp Source: Oral (12/26 0436) BP: 106/67 (12/26 0650) Pulse Rate: 125 (12/26 0650)  Labs: Recent Labs    11/28/19 1320 11/28/19 1500 11/29/19 0930 11/29/19 0930 11/29/19 1316 11/30/19 0215 11/30/19 1033 11/30/19 2106 12/01/19 0738  HGB 12.0*  --  11.8*  --   --  9.9*  --   --  11.3*  HCT 39.2  --  39.0  --   --  31.3*  --   --  34.9*  PLT 370  --  370  --   --  289  --   --  281  HEPARINUNFRC  --   --   --    < >  --  0.41 0.27* 0.39 0.29*  CREATININE 1.28*  --  1.46*  --  0.97  --  0.77  --   --   TROPONINIHS 235* 220*  --   --  197*  --   --   --   --    < > = values in this interval not displayed.    Estimated Creatinine Clearance: 55.2 mL/min (by C-G formula based on SCr of 0.77 mg/dL).   Assessment: 85 yr old male  admitted on 11/28/19 from rehab for generalized weakness, nausea and lethargy. 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 Pt was also recently hospitalized (11/19/19) for CVA with distal basal artery occlusion, requiring IR for thrombectomy.  CTA was positive for acute pulmonary embolism with a large occlusive thrombus within the left main pulmonary artery occluding the left lower lobe pulmonary arterial branches, small nonocclusive thrombus in the right middle lobe segmental branches; no evidence of right heart strain. CT also showed possible nonocclusive thrombus in the right femoral vein. D-dimer is elevated at 2.39; H/H 11.3/34.9, platelets 281 (all stable).   Patient was on no anticoagulants PTA. He was receiving Lovenox 40 mg SQ daily this hospitalization (last dose at 2052 on 11/28/19).  Pharmacy is consulted to dose heparin for PE.   Heparin level is slighttherapeutic at 0.29 on 950 units/hr. No bleeding or problems with infusion per RN. RLE neg for DVT.  Goal of Therapy:  Heparin level 0.3-0.5 units/ml (recent CVA) Monitor platelets by anticoagulation protocol: Yes   Plan:  Increase heparin rate at 1000 units/hr Check  heparin level in 8 hours. Monitor daily heparin level, CBC Monitor for signs/symptoms of bleeding  Thank you for involving pharmacy in this patient's care.  Sherren Kerns, PharmD PGY1 Acute Care Pharmacy Resident 12/01/2019 8:40 AM  **Pharmacist phone directory can be found on [email or web address] listed under [place]**

## 2019-12-02 LAB — CBC
HCT: 36.3 % — ABNORMAL LOW (ref 39.0–52.0)
Hemoglobin: 11.5 g/dL — ABNORMAL LOW (ref 13.0–17.0)
MCH: 29.6 pg (ref 26.0–34.0)
MCHC: 31.7 g/dL (ref 30.0–36.0)
MCV: 93.3 fL (ref 80.0–100.0)
Platelets: 305 10*3/uL (ref 150–400)
RBC: 3.89 MIL/uL — ABNORMAL LOW (ref 4.22–5.81)
RDW: 13.7 % (ref 11.5–15.5)
WBC: 14 10*3/uL — ABNORMAL HIGH (ref 4.0–10.5)
nRBC: 0 % (ref 0.0–0.2)

## 2019-12-02 LAB — BASIC METABOLIC PANEL
Anion gap: 13 (ref 5–15)
BUN: 22 mg/dL (ref 8–23)
CO2: 25 mmol/L (ref 22–32)
Calcium: 8.2 mg/dL — ABNORMAL LOW (ref 8.9–10.3)
Chloride: 97 mmol/L — ABNORMAL LOW (ref 98–111)
Creatinine, Ser: 1.02 mg/dL (ref 0.61–1.24)
GFR calc Af Amer: >60 mL/min (ref >60)
GFR calc non Af Amer: >60 mL/min (ref >60)
Glucose, Bld: 175 mg/dL — ABNORMAL HIGH (ref 70–99)
Potassium: 3 mmol/L — ABNORMAL LOW (ref 3.5–5.1)
Sodium: 135 mmol/L (ref 135–145)

## 2019-12-02 LAB — GLUCOSE, CAPILLARY: Glucose-Capillary: 168 mg/dL — ABNORMAL HIGH (ref 70–99)

## 2019-12-02 LAB — LACTIC ACID, PLASMA
Lactic Acid, Venous: 1.4 mmol/L (ref 0.5–1.9)
Lactic Acid, Venous: 1.3 mmol/L (ref 0.5–1.9)

## 2019-12-02 LAB — PROTIME-INR
INR: 1.2 (ref 0.8–1.2)
Prothrombin Time: 15.4 seconds — ABNORMAL HIGH (ref 11.4–15.2)

## 2019-12-02 MED ORDER — ONDANSETRON HCL 4 MG/2ML IJ SOLN
4.0000 mg | Freq: Four times a day (QID) | INTRAMUSCULAR | Status: DC | PRN
  Administered 2019-12-02 – 2019-12-04 (×2): 4 mg via INTRAVENOUS
  Filled 2019-12-02 (×2): qty 2

## 2019-12-02 MED ORDER — POTASSIUM CHLORIDE 10 MEQ/100ML IV SOLN
10.0000 meq | INTRAVENOUS | Status: AC
  Administered 2019-12-02 (×6): 10 meq via INTRAVENOUS
  Filled 2019-12-02 (×6): qty 100

## 2019-12-02 MED ORDER — ENOXAPARIN SODIUM 60 MG/0.6ML ~~LOC~~ SOLN
60.0000 mg | Freq: Two times a day (BID) | SUBCUTANEOUS | Status: DC
  Administered 2019-12-02 – 2019-12-03 (×3): 60 mg via SUBCUTANEOUS
  Filled 2019-12-02 (×4): qty 0.6

## 2019-12-02 MED ORDER — WARFARIN SODIUM 2.5 MG PO TABS
2.5000 mg | ORAL_TABLET | Freq: Once | ORAL | Status: AC
  Filled 2019-12-02: qty 1

## 2019-12-02 NOTE — TOC Progression Note (Signed)
Transition of Care [facility]) - Progression Note    Patient Details  Name: Kenneth [name] MRN: [id number] Date of Birth: 25-Nov-1928  Transition of Care [facility]) CM/SW [place], [place] Phone Number: [phone] 12/02/2019, 10:29 AM  Clinical Narrative:     CSW spoke with RN to ascertain if patient was medically stable for discharge today and was informed that patient was not medically stable.  TOC team will continue to follow for discharge planning needs.   Expected Discharge Plan: [place] Barriers to Discharge: Continued Medical Work up  Expected Discharge Plan and Services Expected Discharge Plan: [place]   Discharge Planning Services: CM Consult Post Acute Care Choice: [place] Living arrangements for the past 2 months: Single Family Home                 DME Arranged: N/A         HH Arranged: NA           Social Determinants of Health (SDOH) Interventions    Readmission Risk Interventions No flowsheet data found.

## 2019-12-02 NOTE — Progress Notes (Signed)
[place] for enoxaparin and warfarin Indication: Pulmonary Embolus  Allergies  Allergen Reactions  . Lasix [Furosemide] Other (See Comments)    aches  . Spironolactone Other (See Comments)    Red, puffy, eyes, almost hospitalized  . Aspirin Other (See Comments)    Bleeding.    . Lipitor [Atorvastatin] Other (See Comments)    tired  . Lopressor [Metoprolol Tartrate] Other (See Comments)    Sleepy all the time 11.27.2020 Patient is currently taking Lopressor.  [name] Penicillins Hives and Rash    Has patient had a PCN reaction causing immediate rash, facial/tongue/throat swelling, SOB or lightheadedness with hypotension: No Has patient had a PCN reaction causing severe rash involving mucus membranes or skin necrosis: No Has patient had a PCN reaction that required hospitalization No Has patient had a PCN reaction occurring within the last 10 years: Yes If all of the above answers are "NO", then may proceed with Cephalosporin use.     Patient Measurements: Height: 5\' 6"  (167.6 cm) Weight: 135 lb 12.9 oz (61.6 kg) IBW/kg (Calculated) : 63.8 Heparin Dosing Weight: 63.4 kg  Vital Signs: Temp: 97.4 F (36.3 C) (12/27 0852) Temp Source: Oral (12/27 0852) BP: 127/83 (12/27 1046) Pulse Rate: 123 (12/27 1046)  Labs: Recent Labs    11/29/19 1316 11/30/19 0215 11/30/19 1033 11/30/19 2106 12/01/19 0738 12/02/19 0437 12/02/19 0818  HGB  --  9.9*  --   --  11.3* 11.5*  --   HCT  --  31.3*  --   --  34.9* 36.3*  --   PLT  --  289  --   --  281 305  --   LABPROT  --   --   --   --   --  15.4*  --   INR  --   --   --   --   --  1.2  --   HEPARINUNFRC  --  0.41 0.27* 0.39 0.29*  --   --   CREATININE 0.97  --  0.77  --  0.92  --  1.02  TROPONINIHS 197*  --   --   --   --   --   --     Estimated Creatinine Clearance: 42.8 mL/min (by C-G formula based on SCr of 1.02 mg/dL).   Assessment: 85 yr old male admitted on 11/28/19 from rehab for  generalized weakness, nausea and lethargy. 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 Pt was also recently hospitalized (11/19/19) for CVA with distal basal artery occlusion, requiring IR for thrombectomy.  CT positive for acute PE and also showed possible nonocclusive thrombus in the right femoral vein. Patient was on no anticoagulants PTA. Patient initially started on once daily 1.5mg /kg Lovenox to reduce the daily number of injections but will now transition to 1mg /kg twice daily to optimize therapy.  No bleeding per RN. Important drug interactions to note with warfarin are amiodarone, metronidazole, and levothyroxine. Metronidazole and amiodarone can decrease warfarin metabolism and lead to an increase in INR.  Goal of Therapy:  Monitor platelets by anticoagulation protocol: Yes INR  2-3  Plan:  Enoxaparin SQ 60mg  BID Warfarin 2.5 mg once tonight Monitor CBC and INR Monitor for signs/symptoms of bleeding  Thank you for involving pharmacy in this patient's care.  Georga Bora, PharmD Clinical Pharmacist 12/02/2019 11:13 AM Please check AMION for all [place] numbers

## 2019-12-02 NOTE — Progress Notes (Addendum)
PROGRESS NOTE    Kenneth [name]  [id number] DOB: [birth year] DOA: 11/28/2019 PCP: Lavone Orn, MD   Brief Narrative:  HPI: Kenneth [name] is a 85 y.o. 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  Patient presents from rehab for generalized weakness, nausea, and lethargy started last night.  Patient has had 2 hospitalizations at this month after suffering a STEMI and thereafter readmitted after having a stroke.  History is limited from the patient as he continues to stay that he needs water otherwise he will die.  Patient denies of having a dry mouth and requests water.  ED Course: Upon admission to the emergency department patient was seen to be afebrile, heart rate 71-127, respirations 15-43, blood pressures 98/76 ~124/89, and O2 saturations maintained on room air.  Labs significant for WBC 10.7, hemoglobin 12, sodium 147, BUN 32, creatinine 1.28, glucose 170, troponin II 35, and BNP 1428.2.  COVID-19 screening was negative on 12/18.   Initial EKG gave concern for ST wave elevations and cardiology was consulted.  Patient was given 500 mL bolus of normal saline IV fluids. TRH called to admit.  Assessment & Plan:   Active Problems:   HTN (hypertension)   Coronary artery disease involving coronary bypass graft of native heart without angina pectoris   Acute kidney injury (HCC)   Prolonged QT interval   Chronic systolic CHF (congestive heart failure) (HCC)   History of CVA (cerebrovascular accident)   Normocytic anemia   DNR (do not resuscitate)   Generalized weakness   Dehydration   Acute kidney injury superimposed on chronic kidney disease ([place])   Acute colitis   Community acquired pneumonia   Acute pulmonary embolism (HCC)   Generalized weakness secondary to dehydration: Patient presents with generalized  weakness after recently being discharged from the hospital.  He was dehydrated and received IV fluids.  He is lethargic today but had no other complaint.  Lactic acid normal.  Afebrile.  White cells jumped today but that could very well be due to hemoconcentration.  Lethargy is likely due to dehydration once again as well as lack of nutrition.  Continue dextrose.  Consulted dietitian.  PT OT on board.  They recommend SNF.  Patient may be ready for discharge tomorrow.  Acute kidney injury: Patient's creatinine noted to be 1.28, but previously was 0.75 at discharge on the 19th.  Likely due to dehydration.  Now resolved.  CAD, elevated troponin/acute bilateral pulmonary embolism: Patient just recently hospitalized with a STEMI requiring PCI with DES to the SVG-PDA on 12/7.  Troponins 903 on 12/15, but repeat here 235->230 which appear to be tracking down appropriately.  EKG without any significant changes and patient not complaining of any chest pain.  Seen by cardiology and they are not concerned about this.  When I saw him on the morning of 11/29/2019, he did not have any chest pain.  However I was called later by nurse that he was having some chest pain.  EKG repeated did not show any changes.  We ordered another troponin which was 197, decreasing from the previous.  D-dimer was obtained which was elevated raising concern for possible PE.  CT angiogram stat was ordered and it was positive for acute pulmonary embolism with a large occlusive thrombus within the left main pulmonary artery occluding the left lower lobe pulmonary  arterial branches. Small nonocclusive thrombus in the right middle lobe segmental branches. No evidence of right heart strain. patient was started on heparin IV right away.  Since he was hemodynamically stable so there was no indication for thrombectomy.  Does not have any chest pain since last 2 days.  Blood pressure remains on the low side but otherwise hemodynamically stable.  Continue  Lovenox and Coumadin.  Abdominal tenderness/large hiatal hernia/ ?  Possible acute colitis: Due to him complaining of abdominal pain and abdominal tenderness on exam, initially concern was for possible ileus.  Abdominal x-ray was obtained which did not show obstruction.  CT of the abdomen and pelvis with contrast was obtained which shows large hiatal hernia with the entire stomach herniated into the lower chest.  No diverticulitis but mild wall thickening of transverse colon.  Based on the exams finding which had upper abdominal tenderness, I think this is possible colitis and should be treated and thus he was started on Zosyn which I will continue.   Hypernatremia: Acute but mild and resolved.  Hypokalemia: 3.0.  Will replace through IV.  Recheck in the morning.  Prolonged QT interval: Acute.  QTC elevated at 538. -Recheck EKG in a.m. -Hold QT prolonging medication  Paroxysmal atrial fibrillation: Started on p.o. amiodarone by cardiology on 12/01/2019.  On Coumadin and Lovenox.  Systolic congestive heart failure: Patient appears to be hypovolemic at this time.  EF noted to be 20-25% on 12/7.  BMP initially had been 2174.  Seen by cardiology.  No concerns expressed.  Looks dry.  Torsemide initially was held due to AKI and dehydration.  He was started on torsemide half dose which will be 20 mg p.o. daily on 11/30/2019.  Continue that.   Essential hypertension: On admission patient's blood pressures were noted to be soft 90/72.  It appears after being discharged from the hospital last there were several changes made including discontinuation of 12.5 mg of metoprolol succinate and losartan changed to 25 mg twice daily.  His blood pressure continues to be soft despite of holding antihypertensives.  We will continue to hold them and watch closely.    Recent nonhemorrhagic CVA: Patient was recently hospitalized for distal basal artery occlusion with thready flow to the right PCA requiring  interventional radiology for thrombectomy. -Continue Plavix  -Dysphagia 3 diet -Aspiration precautions  Normocytic anemia: Hemoglobin stable.  Anxiety -Held Lexapro due to prolonged QT  -Continue Xanax as needed  Hypothyroidism: TSH 2.833 on 11/27. -Continue levothyroxine  Hyperlipidemia -Continue Crestor   DVT prophylaxis: Lovenox and Coumadin Code Status: DNR present on admission Family Communication:  None present at bedside.  Called his daughter and updated her about PE and rest of the imaging studies and plan of care on 11/30/2019.  She was appreciative for the call. Disposition Plan: May discharge to SNF tomorrow.  Estimated body mass index is 21.92 kg/m as calculated from the following:   Height as of this encounter: 5\' 6"  (1.676 m).   Weight as of this encounter: 61.6 kg.      Nutritional status:               Consultants:   Cardiology  Procedures:   None  Antimicrobials:   Zosyn today   Subjective: Seen and examined.  Very groggy this morning but denied any abdominal pain or chest pain.  Objective: Vitals:   12/02/19 1201 12/02/19 1216 12/02/19 1231 12/02/19 1247  BP: 99/69 104/84 (!) 119/93 120/82  Pulse: (!) 126 (!) 125 [name])  123 (!) 55  Resp:      Temp:      TempSrc:      SpO2: 100% 100% 100% 100%  Weight:      Height:        Intake/Output Summary (Last 24 hours) at 12/02/2019 1335 Last data filed at 12/02/2019 0626 Gross per 24 hour  Intake 420 ml  Output 425 ml  Net -5 ml   Filed Weights   11/30/19 0600 12/01/19 0155 12/02/19 0511  Weight: 63.8 kg 62.3 kg 61.6 kg    Examination:  General exam: Appears lethargic Respiratory system: Clear to auscultation. Respiratory effort normal. Cardiovascular system: S1 & S2 heard, irregularly irregular rate and rhythm. No JVD, murmurs, rubs, gallops or clicks. No pedal edema. Gastrointestinal system: Abdomen is nondistended, soft and nontender. No organomegaly or masses felt.  Normal bowel sounds heard. Central nervous system: Lethargic but oriented.  No focal deficit. Extremities: Symmetric 5 x 5 power. Skin: No rashes, lesions or ulcers.  Psychiatry: Lethargic   Data Reviewed: I have personally reviewed following labs and imaging studies  CBC: Recent Labs  Lab 11/28/19 1320 11/29/19 0930 11/30/19 0215 12/01/19 0738 12/02/19 0437  WBC 10.7* 12.3* 9.7 9.7 14.0*  NEUTROABS 8.7* 9.9*  --   --   --   HGB 12.0* 11.8* 9.9* 11.3* 11.5*  HCT 39.2 39.0 31.3* 34.9* 36.3*  MCV 99.5 98.5 95.7 92.6 93.3  PLT 370 370 289 281 [id number]   Basic Metabolic Panel: Recent Labs  Lab 11/29/19 0439 11/29/19 0930 11/29/19 1316 11/30/19 1033 12/01/19 0738 12/02/19 0818  NA  --  144 142 138 140 135  K  --  3.6 3.2* 4.1 3.0* 3.0*  CL  --  106 104 108 103 97*  CO2  --  21* 23 20* 26 25  GLUCOSE  --  173* 158* 165* 157* 175*  BUN  --  24* 23 18 20 22   CREATININE  --  1.46* 0.97 0.77 0.92 1.02  CALCIUM  --  9.0 8.9 8.7* 8.1* 8.2*  MG 1.8  --   --   --   --   --    GFR: Estimated Creatinine Clearance: 42.8 mL/min (by C-G formula based on SCr of 1.02 mg/dL). Liver Function Tests: Recent Labs  Lab 11/28/19 1320  AST 16  ALT 14  ALKPHOS 82  BILITOT 1.2  PROT 6.5  ALBUMIN 2.9*   No results for input(s): LIPASE, AMYLASE in the last 168 hours. No results for input(s): AMMONIA in the last 168 hours. Coagulation Profile: Recent Labs  Lab 12/02/19 0437  INR 1.2   Cardiac Enzymes: No results for input(s): CKTOTAL, CKMB, CKMBINDEX, TROPONINI in the last 168 hours. BNP (last 3 results) No results for input(s): PROBNP in the last 8760 hours. HbA1C: No results for input(s): HGBA1C in the last 72 hours. CBG: Recent Labs  Lab 11/28/19 2216 11/29/19 0622 12/02/19 0842  GLUCAP 138* 159* 168*   Lipid Profile: No results for input(s): CHOL, HDL, LDLCALC, TRIG, CHOLHDL, LDLDIRECT in the last 72 hours. Thyroid Function Tests: No results for input(s): TSH, T4TOTAL,  FREET4, T3FREE, THYROIDAB in the last 72 hours. Anemia Panel: No results for input(s): VITAMINB12, FOLATE, FERRITIN, TIBC, IRON, RETICCTPCT in the last 72 hours. Sepsis Labs: Recent Labs  Lab 11/29/19 1316 12/02/19 0942 12/02/19 1124  LATICACIDVEN 1.5 1.4 1.3    Recent Results (from the past 240 hour(s))  C difficile quick scan w PCR reflex     Status: None  Collection Time: 11/23/19  6:10 AM   Specimen: STOOL  Result Value Ref Range Status   C Diff antigen NEGATIVE NEGATIVE Final   C Diff toxin NEGATIVE NEGATIVE Final   C Diff interpretation No C. difficile detected.  Final    Comment: Performed at [facility], [place] [address], [place], [place] [address]  Respiratory Panel by RT PCR (Flu A&B, Covid) -     Status: None   Collection Time: 11/23/19  1:18 PM  Result Value Ref Range Status   SARS Coronavirus 2 by RT PCR NEGATIVE NEGATIVE Final    Comment: (NOTE) SARS-CoV-2 target nucleic acids are NOT DETECTED. The SARS-CoV-2 RNA is generally detectable in upper respiratoy specimens during the acute phase of infection. The lowest concentration of SARS-CoV-2 viral copies this assay can detect is 131 copies/mL. A negative result does not preclude SARS-Cov-2 infection and should not be used as the sole basis for treatment or other patient management decisions. A negative result may occur with  improper specimen collection/handling, submission of specimen other than nasopharyngeal swab, presence of viral mutation(s) within the areas targeted by this assay, and inadequate number of viral copies (<131 copies/mL). A negative result must be combined with clinical observations, patient history, and epidemiological information. The expected result is Negative. Fact Sheet for Patients:  PinkCheek.be Fact Sheet for Healthcare Providers:  GravelBags.it This test is not yet ap proved or cleared by the [place] FDA and  has  been authorized for detection and/or diagnosis of SARS-CoV-2 by FDA under an Emergency Use Authorization (EUA). This EUA will remain  in effect (meaning this test can be used) for the duration of the COVID-19 declaration under Section 564(b)(1) of the Act, 21 U.S.C. section 360bbb-3(b)(1), unless the authorization is terminated or revoked sooner.    Influenza A by PCR NEGATIVE NEGATIVE Final   Influenza B by PCR NEGATIVE NEGATIVE Final    Comment: (NOTE) The Xpert Xpress SARS-CoV-2/FLU/RSV assay is intended as an aid in  the diagnosis of influenza from Nasopharyngeal swab specimens and  should not be used as a sole basis for treatment. Nasal washings and  aspirates are unacceptable for Xpert Xpress SARS-CoV-2/FLU/RSV  testing. Fact Sheet for Patients: PinkCheek.be Fact Sheet for Healthcare Providers: GravelBags.it This test is not yet approved or cleared by the [place] FDA and  has been authorized for detection and/or diagnosis of SARS-CoV-2 by  FDA under an Emergency Use Authorization (EUA). This EUA will remain  in effect (meaning this test can be used) for the duration of the  Covid-19 declaration under Section 564(b)(1) of the Act, 21  U.S.C. section 360bbb-3(b)(1), unless the authorization is  terminated or revoked. Performed at [facility], [place] [address], [place], [place] [address]   SARS CORONAVIRUS 2 (TAT 6-24 HRS) Nasopharyngeal Nasopharyngeal Swab     Status: None   Collection Time: 11/28/19  6:30 PM   Specimen: Nasopharyngeal Swab  Result Value Ref Range Status   SARS Coronavirus 2 NEGATIVE NEGATIVE Final    Comment: (NOTE) SARS-CoV-2 target nucleic acids are NOT DETECTED. The SARS-CoV-2 RNA is generally detectable in upper and lower respiratory specimens during the acute phase of infection. Negative results do not preclude SARS-CoV-2 infection, do not rule out co-infections with other pathogens,  and should not be used as the sole basis for treatment or other patient management decisions. Negative results must be combined with clinical observations, patient history, and epidemiological information. The expected result is Negative. Fact Sheet for  Patients: SugarRoll.be Fact Sheet for Healthcare Providers: https://www.woods-mathews.com/ This test is not yet approved or cleared by the [place] FDA and  has been authorized for detection and/or diagnosis of SARS-CoV-2 by FDA under an Emergency Use Authorization (EUA). This EUA will remain  in effect (meaning this test can be used) for the duration of the COVID-19 declaration under Section 56 4(b)(1) of the Act, 21 U.S.C. section 360bbb-3(b)(1), unless the authorization is terminated or revoked sooner. Performed at [facility], [place] [address], [place], [place] [address]       Radiology Studies: No results found.  Scheduled Meds: . alum & mag hydroxide-simeth  30 mL Oral Once  . amiodarone  400 mg Oral BID  . clopidogrel  75 mg Oral Daily  . enoxaparin (LOVENOX) injection  60 mg Subcutaneous BID  . ferrous sulfate  325 mg Oral Q breakfast  . latanoprost  1 drop Both Eyes QHS  . levothyroxine  50 mcg Oral Q0600  . rosuvastatin  20 mg Oral q1800  . sodium chloride flush  3 mL Intravenous Q12H  . torsemide  20 mg Oral Daily  . vitamin B-12  500 mcg Oral Daily  . warfarin  2.5 mg Oral ONCE-1800  . Warfarin - Pharmacist Dosing Inpatient   Does not apply q1800   Continuous Infusions: . ceFEPime (MAXIPIME) IV 2 g (12/02/19 1216)  . dextrose 50 mL/hr at 11/30/19 1951  . metronidazole 500 mg (12/02/19 0626)  . potassium chloride 10 mEq (12/02/19 1329)     LOS: 3 days   Time spent: 31 minutes   Darliss Cheney, MD [facility]  12/02/2019, 1:35 PM   To contact the attending provider between 7A-7P or the covering provider during after hours 7P-7A, please log into the  web site [email or web address] and use password [email or web address].

## 2019-12-02 NOTE — Progress Notes (Signed)
Patient has refused all meals and med's today due "sick feeling" that he cannot describe, Witnessed him telling his daughter via phone, that he "will not be alive much longer." Patient has been sleeping off and on all day and calm.  Vitals stable, Lines and tubes intact with iv fluids infusing.

## 2019-12-02 NOTE — Progress Notes (Signed)
Progress Note  Patient Name: Kenneth [name] Date of Encounter: 12/02/2019  Primary Cardiologist:   Sinclair Grooms, MD   Subjective   Very somnolent but denies pain or SOB.  Doesn't want to eat.  Inpatient Medications    Scheduled Meds: . alum & mag hydroxide-simeth  30 mL Oral Once  . amiodarone  400 mg Oral BID  . clopidogrel  75 mg Oral Daily  . enoxaparin (LOVENOX) injection  90 mg Subcutaneous Q24H  . ferrous sulfate  325 mg Oral Q breakfast  . latanoprost  1 drop Both Eyes QHS  . levothyroxine  50 mcg Oral Q0600  . rosuvastatin  20 mg Oral q1800  . sodium chloride flush  3 mL Intravenous Q12H  . torsemide  20 mg Oral Daily  . vitamin B-12  500 mcg Oral Daily  . Warfarin - Pharmacist Dosing Inpatient   Does not apply q1800   Continuous Infusions: . ceFEPime (MAXIPIME) IV 2 g (12/01/19 2159)  . dextrose 50 mL/hr at 11/30/19 1951  . metronidazole 500 mg (12/02/19 0626)   PRN Meds: acetaminophen **OR** acetaminophen, albuterol, ALPRAZolam, ondansetron (ZOFRAN) IV, polyvinyl alcohol   Vital Signs    Vitals:   12/01/19 2031 12/02/19 0511 12/02/19 0514 12/02/19 0852  BP: 119/60  100/73 103/66  Pulse: 100  77 (!) 110  Resp: 18  19   Temp: 98.8 F (37.1 C)  97.7 F (36.5 C) (!) 97.4 F (36.3 C)  TempSrc: Oral  Oral Oral  SpO2: 100%  99% 99%  Weight:  61.6 kg    Height:        Intake/Output Summary (Last 24 hours) at 12/02/2019 0911 Last data filed at 12/02/2019 0626 Gross per 24 hour  Intake 660 ml  Output 1175 ml  Net -515 ml   Filed Weights   11/30/19 0600 12/01/19 0155 12/02/19 0511  Weight: 63.8 kg 62.3 kg 61.6 kg    Telemetry    PAF - Personally Reviewed  ECG    NA- Personally Reviewed  Physical Exam   GEN: No  acute distress.   Neck: No  JVD Cardiac: Irregular RR, distant heart rate. Respiratory:   Decreased bowel sounds GI: Soft, nontender, non-distended, normal bowel sounds  MS:  No edema; No deformity. Neuro:   Nonfocal    Psych:     Very somnolent.  Appropriate.    Labs    Chemistry Recent Labs  Lab 11/28/19 1320 11/30/19 1033 12/01/19 0738 12/02/19 0818  NA 147* 138 140 135  K 3.5 4.1 3.0* 3.0*  CL 109 108 103 97*  CO2 18* 20* 26 25  GLUCOSE 170* 165* 157* 175*  BUN 32* 18 20 22   CREATININE 1.28* 0.77 0.92 1.02  CALCIUM 9.1 8.7* 8.1* 8.2*  PROT 6.5  --   --   --   ALBUMIN 2.9*  --   --   --   AST 16  --   --   --   ALT 14  --   --   --   ALKPHOS 82  --   --   --   BILITOT 1.2  --   --   --   GFRNONAA 49* >60 >60 >60  GFRAA 57* >60 >60 >60  ANIONGAP 20* 10 11 13      Hematology Recent Labs  Lab 11/30/19 0215 12/01/19 0738 12/02/19 0437  WBC 9.7 9.7 14.0*  RBC 3.27* 3.77* 3.89*  HGB 9.9* 11.3* 11.5*  HCT 31.3* 34.9*  36.3*  MCV 95.7 92.6 93.3  MCH 30.3 30.0 29.6  MCHC 31.6 32.4 31.7  RDW 14.0 13.6 13.7  PLT 289 281 305    Cardiac EnzymesNo results for input(s): TROPONINI in the last 168 hours. No results for input(s): TROPIPOC in the last 168 hours.   BNP Recent Labs  Lab 11/28/19 1320  BNP 1,428.2*     DDimer  Recent Labs  Lab 11/29/19 1316  DDIMER 2.39*     Radiology    VAS [place] LOWER EXTREMITY VENOUS (DVT)  Result Date: 11/30/2019  Lower Venous Study Indications: Pulmonary embolism.  Comparison Study: no prior Performing Technologist: June Leap RDMS, RVT  Examination Guidelines: A complete evaluation includes B-mode imaging, spectral Doppler, color Doppler, and power Doppler as needed of all accessible portions of each vessel. Bilateral testing is considered an integral part of a complete examination. Limited examinations for reoccurring indications may be performed as noted.  +---------+---------------+---------+-----------+----------+--------------+ RIGHT    CompressibilityPhasicitySpontaneityPropertiesThrombus Aging +---------+---------------+---------+-----------+----------+--------------+ CFV      Full           Yes      Yes                                  +---------+---------------+---------+-----------+----------+--------------+ SFJ      Full                                                        +---------+---------------+---------+-----------+----------+--------------+ FV Prox  Full                                                        +---------+---------------+---------+-----------+----------+--------------+ FV Mid   Full                                                        +---------+---------------+---------+-----------+----------+--------------+ FV DistalFull                                                        +---------+---------------+---------+-----------+----------+--------------+ PFV      Full                                                        +---------+---------------+---------+-----------+----------+--------------+ POP      Full           Yes      Yes                                 +---------+---------------+---------+-----------+----------+--------------+ PTV      Full                                                        +---------+---------------+---------+-----------+----------+--------------+  PERO     Full                                                        +---------+---------------+---------+-----------+----------+--------------+   +---------+---------------+---------+-----------+----------+--------------+ LEFT     CompressibilityPhasicitySpontaneityPropertiesThrombus Aging +---------+---------------+---------+-----------+----------+--------------+ CFV      Full           Yes      Yes                                 +---------+---------------+---------+-----------+----------+--------------+ SFJ      Full                                                        +---------+---------------+---------+-----------+----------+--------------+ FV Prox  Full                                                         +---------+---------------+---------+-----------+----------+--------------+ FV Mid   Full                                                        +---------+---------------+---------+-----------+----------+--------------+ FV DistalFull                                                        +---------+---------------+---------+-----------+----------+--------------+ PFV      Full                                                        +---------+---------------+---------+-----------+----------+--------------+ POP      Full           Yes      Yes                                 +---------+---------------+---------+-----------+----------+--------------+ PTV      Full                                                        +---------+---------------+---------+-----------+----------+--------------+ PERO     Full                                                        +---------+---------------+---------+-----------+----------+--------------+  Summary: Right: There is no evidence of deep vein thrombosis in the lower extremity. No cystic structure found in the popliteal fossa. Left: There is no evidence of deep vein thrombosis in the lower extremity. No cystic structure found in the popliteal fossa.  *See table(s) above for measurements and observations. Electronically signed by Monica Martinez MD on 11/30/2019 at 1:43:32 PM.    Final     Cardiac Studies   ECHO 11/19/19  1. Left ventricular ejection fraction, by visual estimation, is 20 to 25%. The left ventricle has normal function. There is no left ventricular hypertrophy.  2. Left ventricular diastolic parameters are indeterminate.  3. The left ventricle demonstrates global hypokinesis.  4. COmpared to previous echo on 11/12/19, LVEF is unchanged.  5. Global right ventricle mild to moderately reduced..The right ventricular size is normal. No increase in right ventricular wall thickness.  6. Left atrial size was  moderately dilated.  7. Right atrial size was normal.  8. The mitral valve is grossly normal. Moderate mitral valve regurgitation.  9. The tricuspid valve is normal in structure. Tricuspid valve regurgitation is trivial. 10. The aortic valve is tricuspid. Aortic valve regurgitation is not visualized. Mild aortic valve stenosis. 11. The pulmonic valve was normal in structure. Pulmonic valve regurgitation is not visualized. 12. The aortic root was not well visualized. 13. The inferior vena cava is normal in size with greater than 50% respiratory variability, suggesting right atrial pressure of 3 mmHg.  Patient Profile     85 y.o. 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 Patient presents from rehab for generalized weakness, nausea, and lethargy started last night. Patient has had 2 hospitalizations at this month after suffering a STEMI and thereafter readmitted after having a stroke. Cardiology was consulted secondary to an abnormal EKG.   Found to have an acute PE.    Assessment & Plan    CAD:    Continue current meds.   Will be on Plavix and warfarin.   CHRONIC SYSTOLIC HF:     BP is still mildly low.  Will hold off on resuming any of his previous afterload reduction.  Might be able to start in the AM.  Although he appears to be declining palliative care is appropriate.   HTN:     See above.   ATRIAL FIB:  PAF.  On warfarin.  PAF.  Started PO amiodarone.    HYPOKALEMIA:    Received 80 meq yesterday.  I will give an additional 40 meq today.  Potassium unchanged at 3.0.    For questions or updates, please contact [facility] Please consult [email or web address] for contact info under Cardiology/STEMI.   Signed, Minus Breeding, MD  12/02/2019, 9:11 AM

## 2019-12-02 NOTE — Progress Notes (Signed)
Patient is experiencing acute on chronic lethargy, with response to voice, "feels sick" with nausea vomiting and reported and evident loose stools over night. t-97.4, 02 sat 99%, HR 110-120s a-fid, no med's given a sip of water made patient regurgitate. Attending hospitalist MD and cardiologist aware of changes, New orders placed for Zofran, Lactic acid and and dietition .Currently preforming frequent vitals.

## 2019-12-03 DIAGNOSIS — E43 Unspecified severe protein-calorie malnutrition: Secondary | ICD-10-CM | POA: Insufficient documentation

## 2019-12-03 LAB — CBC
HCT: 32.7 % — ABNORMAL LOW (ref 39.0–52.0)
Hemoglobin: 10.8 g/dL — ABNORMAL LOW (ref 13.0–17.0)
MCH: 30.3 pg (ref 26.0–34.0)
MCHC: 33 g/dL (ref 30.0–36.0)
MCV: 91.6 fL (ref 80.0–100.0)
Platelets: 276 10*3/uL (ref 150–400)
RBC: 3.57 MIL/uL — ABNORMAL LOW (ref 4.22–5.81)
RDW: 13.5 % (ref 11.5–15.5)
WBC: 11.4 10*3/uL — ABNORMAL HIGH (ref 4.0–10.5)
nRBC: 0 % (ref 0.0–0.2)

## 2019-12-03 LAB — PROTIME-INR
INR: 1.4 — ABNORMAL HIGH (ref 0.8–1.2)
Prothrombin Time: 16.6 seconds — ABNORMAL HIGH (ref 11.4–15.2)

## 2019-12-03 LAB — BASIC METABOLIC PANEL
Anion gap: 8 (ref 5–15)
BUN: 24 mg/dL — ABNORMAL HIGH (ref 8–23)
CO2: 24 mmol/L (ref 22–32)
Calcium: 8 mg/dL — ABNORMAL LOW (ref 8.9–10.3)
Chloride: 101 mmol/L (ref 98–111)
Creatinine, Ser: 0.95 mg/dL (ref 0.61–1.24)
GFR calc Af Amer: >60 mL/min (ref >60)
GFR calc non Af Amer: >60 mL/min (ref >60)
Glucose, Bld: 174 mg/dL — ABNORMAL HIGH (ref 70–99)
Potassium: 3.5 mmol/L (ref 3.5–5.1)
Sodium: 133 mmol/L — ABNORMAL LOW (ref 135–145)

## 2019-12-03 LAB — MAGNESIUM: Magnesium: 1.3 mg/dL — ABNORMAL LOW (ref 1.7–2.4)

## 2019-12-03 MED ORDER — ENSURE ENLIVE PO LIQD: 237.0000 mL | Freq: Two times a day (BID) | ORAL | Status: DC

## 2019-12-03 MED ORDER — MAGNESIUM SULFATE 4 GM/100ML IV SOLN
4.0000 g | Freq: Once | INTRAVENOUS | Status: AC
  Administered 2019-12-03: 4 g via INTRAVENOUS
  Filled 2019-12-03: qty 100

## 2019-12-03 MED ORDER — MORPHINE SULFATE (PF) 4 MG/ML IV SOLN
4.0000 mg | INTRAVENOUS | Status: DC | PRN
  Administered 2019-12-03 – 2019-12-04 (×2): 4 mg via INTRAVENOUS
  Filled 2019-12-03 (×2): qty 1

## 2019-12-03 MED ORDER — POTASSIUM CHLORIDE CRYS ER 20 MEQ PO TBCR: 30.0000 meq | EXTENDED_RELEASE_TABLET | Freq: Four times a day (QID) | ORAL | Status: DC

## 2019-12-03 MED ORDER — WARFARIN SODIUM 2.5 MG PO TABS: 2.5000 mg | ORAL_TABLET | Freq: Once | ORAL | Status: DC

## 2019-12-03 NOTE — Care Management Important Message (Signed)
Important Message  Patient Details  Name: Kenneth [name] MRN: [id number] Date of Birth: 14-Dec-1927   Medicare Important Message Given:  Yes     [name] 12/03/2019, 12:47 PM

## 2019-12-03 NOTE — Progress Notes (Signed)
Physical Therapy Treatment Patient Details Name: Kenneth [name] MRN: [id number] DOB: 1928/02/13 Today's Date: 12/03/2019    History of Present Illness 85 y.o. 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  Patient presents from rehab for generalized weakness, nausea, and lethargy started last night.    PT Comments    Patient seen for mobility progression. Pt in bed upon arrival and agreeable to participating in therapy. Pt initiated bed mobility when asked if he would like to get OOB. Pt requires mod A +2 for functional transfer training and pt able to take steps getting bed to recliner. Pt fatigued and keeping eyes closed a lot during session. Continue to progress as tolerated with anticipated d/c to SNF for further skilled PT services.      Follow Up Recommendations  SNF;Supervision/Assistance - 24 hour     Equipment Recommendations  None recommended by PT(defer to post-acute)    Recommendations for Other Services       Precautions / Restrictions Precautions Precautions: Fall Precaution Comments: HOH Restrictions Weight Bearing Restrictions: No    Mobility  Bed Mobility Overal bed mobility: Needs Assistance Bed Mobility: Supine to Sit     Supine to sit: Max assist;HOB elevated     General bed mobility comments: pt assisted to bring bilat LE to EOB; assist to scoot hips and elevate trunk into sitting   Transfers Overall transfer level: Needs assistance Equipment used: 2 person hand held assist Transfers: Sit to/from [facility] Sit to Stand: +2 physical assistance;Mod assist Stand pivot transfers: +2 physical assistance;Mod assist       General transfer comment: +2 face to face with gait belt and knees blocked; assist to power up into standing and then for balance and weight  shifting when pivoting to recliner; pt able to take steps   Ambulation/Gait                 Stairs             Wheelchair Mobility    Modified Rankin (Stroke Patients Only) Modified Rankin (Stroke Patients Only) Pre-Morbid Rankin Score: No significant disability Modified Rankin: Moderately severe disability     Balance Overall balance assessment: Needs assistance Sitting-balance support: Bilateral upper extremity supported;Feet supported Sitting balance-Leahy Scale: Fair Sitting balance - Comments: initially poor sitting balance but able to progress to fair needing min guard for safety   Standing balance support: Bilateral upper extremity supported Standing balance-Leahy Scale: Poor                              Cognition Arousal/Alertness: Lethargic Behavior During Therapy: Flat affect Overall Cognitive Status: Impaired/Different from baseline                                 General Comments: pt keeping eyes closed a lot during session but responds yes when asked if he would like to get OOB and began initiating bed mobility; follows single step commands with increased time       Exercises      General Comments        Pertinent Vitals/Pain Pain Assessment: Faces Faces Pain Scale: Hurts a little bit Pain Location: back Pain Descriptors / Indicators: Aching;Sore Pain Intervention(s):  Limited activity within patient's tolerance;Monitored during session;Repositioned    Home Living                      Prior Function            PT Goals (current goals can now be found in the care plan section) Progress towards PT goals: Progressing toward goals    Frequency    Min 2X/week      PT Plan Current plan remains appropriate    Co-evaluation              AM-PAC PT "6 Clicks" Mobility   Outcome Measure  Help needed turning from your back to your side while in a flat bed without using bedrails?: Total Help  needed moving from lying on your back to sitting on the side of a flat bed without using bedrails?: Total Help needed moving to and from a bed to a chair (including a wheelchair)?: A Lot Help needed standing up from a chair using your arms (e.g., wheelchair or bedside chair)?: A Lot Help needed to walk in hospital room?: A Lot Help needed climbing 3-5 steps with a railing? : Total 6 Click Score: 9    End of Session Equipment Utilized During Treatment: Gait belt Activity Tolerance: Patient limited by fatigue Patient left: in chair;with call bell/phone within reach;with chair alarm set Nurse Communication: Mobility status PT Visit Diagnosis: Unsteadiness on feet (R26.81);Muscle weakness (generalized) (M62.81)     Time: [id number] PT Time Calculation (min) (ACUTE ONLY): 26 min  Charges:  $Gait Training: 8-22 mins $Therapeutic Activity: 8-22 mins                     Earney Navy, PTA Acute Rehabilitation Services Pager: [phone] Office: [phone]     Darliss Cheney 12/03/2019, 3:47 PM

## 2019-12-03 NOTE — Progress Notes (Signed)
[place] for enoxaparin and warfarin Indication: Pulmonary Embolus  Allergies  Allergen Reactions  . Lasix [Furosemide] Other (See Comments)    aches  . Spironolactone Other (See Comments)    Red, puffy, eyes, almost hospitalized  . Aspirin Other (See Comments)    Bleeding.    . Lipitor [Atorvastatin] Other (See Comments)    tired  . Lopressor [Metoprolol Tartrate] Other (See Comments)    Sleepy all the time 11.27.2020 Patient is currently taking Lopressor.  [name] Penicillins Hives and Rash    Has patient had a PCN reaction causing immediate rash, facial/tongue/throat swelling, SOB or lightheadedness with hypotension: No Has patient had a PCN reaction causing severe rash involving mucus membranes or skin necrosis: No Has patient had a PCN reaction that required hospitalization No Has patient had a PCN reaction occurring within the last 10 years: Yes If all of the above answers are "NO", then may proceed with Cephalosporin use.     Patient Measurements: Height: 5\' 6"  (167.6 cm) Weight: 135 lb 9.3 oz (61.5 kg) IBW/kg (Calculated) : 63.8 Heparin Dosing Weight: 63.4 kg  Vital Signs: Temp: 97.5 F (36.4 C) (12/28 1136) Temp Source: Oral (12/28 1136) BP: 107/70 (12/28 1136) Pulse Rate: 59 (12/28 1136)  Labs: Recent Labs    11/30/19 2106 12/01/19 0738 12/02/19 0437 12/02/19 0818 12/03/19 0321  HGB  --  11.3* 11.5*  --  10.8*  HCT  --  34.9* 36.3*  --  32.7*  PLT  --  281 305  --  276  LABPROT  --   --  15.4*  --  16.6*  INR  --   --  1.2  --  1.4*  HEPARINUNFRC 0.39 0.29*  --   --   --   CREATININE  --  0.92  --  1.02 0.95    Estimated Creatinine Clearance: 45.9 mL/min (by C-G formula based on SCr of 0.95 mg/dL).   Assessment: 85 yr old male admitted on 11/28/19 from rehab for generalized weakness, nausea and lethargy. 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 Pt was also recently  hospitalized (11/19/19) for CVA with distal basal artery occlusion, requiring IR for thrombectomy.  CT positive for acute PE and also showed possible nonocclusive thrombus in the right femoral vein. Patient was on no anticoagulants PTA. Patient initially started on once daily 1.5mg /kg Lovenox to reduce the daily number of injections but will now transition to 1mg /kg twice daily to optimize therapy.  INR still below goal this AM - patient refusing all meds (including warfarin dose last night and lovenox injections)  Goal of Therapy:  Monitor platelets by anticoagulation protocol: Yes INR  2-3  Plan:  Enoxaparin SQ 60mg  BID Warfarin 2.5 mg once tonight (if he will take) Monitor CBC and INR Monitor for signs/symptoms of bleeding  Thank you for involving pharmacy in this patient's care.  Marguerite Olea, [facility] Clinical Pharmacist Phone [phone]  12/03/2019 12:36 PM

## 2019-12-03 NOTE — Progress Notes (Signed)
PROGRESS NOTE    Kenneth [name]  [id number] DOB: 04-Feb-1928 DOA: 11/28/2019 PCP: Lavone Orn, MD   Brief Narrative:  HPI: Kenneth [name] is a 85 y.o. 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  Patient presents from rehab for generalized weakness, nausea, and lethargy started last night.  Patient has had 2 hospitalizations at this month after suffering a STEMI and thereafter readmitted after having a stroke.  History is limited from the patient as he continues to stay that he needs water otherwise he will die.  Patient denies of having a dry mouth and requests water.  ED Course: Upon admission to the emergency department patient was seen to be afebrile, heart rate 71-127, respirations 15-43, blood pressures 98/76 ~124/89, and O2 saturations maintained on room air.  Labs significant for WBC 10.7, hemoglobin 12, sodium 147, BUN 32, creatinine 1.28, glucose 170, troponin II 35, and BNP 1428.2.  COVID-19 screening was negative on 12/18.   Initial EKG gave concern for ST wave elevations and cardiology was consulted.  Patient was given 500 mL bolus of normal saline IV fluids. TRH called to admit.  Assessment & Plan:   Principal Problem:   Acute pulmonary embolism (HCC) Active Problems:   HTN (hypertension)   Coronary artery disease involving coronary bypass graft of native heart without angina pectoris   Acute kidney injury (HCC)   Prolonged QT interval   Chronic systolic CHF (congestive heart failure) (HCC)   History of CVA (cerebrovascular accident)   Normocytic anemia   DNR (do not resuscitate)   Generalized weakness   Dehydration   Acute kidney injury superimposed on chronic kidney disease ([place])   Acute colitis   Community acquired pneumonia   Protein-calorie malnutrition, severe   Generalized weakness  secondary to dehydration: Patient presents with generalized weakness after recently being discharged from the hospital.  He was dehydrated and received IV fluids.  He is lethargic today but had no other complaint.  Lactic acid normal.  Afebrile.  Leukocytosis improved a little bit.  Lethargy is likely due to lack of nutrition.  Continue dextrose IV fluids.Geanie Cooley dietitian.  PT OT on board.  They recommend SNF.  Patient has given now.  He has been refusing meds and food since about yesterday.  Looks more lethargic today.  Did not want to talk much this morning.  Also he was coughing excessively when I saw him this morning. I wonder if he has aspiration pneumonia now.  At that time, no family member was present.  Her daughter came in to the room 30 minutes later and I was informed.  I talked to her personally about considering palliative/hospice.  She was very receptive of the idea and agreed to have palliative care see patient and possibly eventually transitioning to hospice.  Acute kidney injury: Patient's creatinine noted to be 1.28, but previously was 0.75 at discharge on the 19th.  Likely due to dehydration.  Now resolved.  CAD, elevated troponin/acute bilateral pulmonary embolism: Patient just recently hospitalized with a STEMI requiring PCI with DES to the SVG-PDA on 12/7.  Troponins 903 on 12/15, but repeat here 235->230 which appear to be tracking down appropriately.  EKG without any significant changes and patient not complaining of any chest pain.  Seen by cardiology and they are not concerned about this.  When I  saw him on the morning of 11/29/2019, he did not have any chest pain.  However I was called later by nurse that he was having some chest pain.  EKG repeated did not show any changes.  We ordered another troponin which was 197, decreasing from the previous.  D-dimer was obtained which was elevated raising concern for possible PE.  CT angiogram stat was ordered and it was positive for  acute pulmonary embolism with a large occlusive thrombus within the left main pulmonary artery occluding the left lower lobe pulmonary arterial branches. Small nonocclusive thrombus in the right middle lobe segmental branches. No evidence of right heart strain. patient was started on heparin IV right away.  Since he was hemodynamically stable so there was no indication for thrombectomy.  Does not have any chest pain since last 2 days.  Blood pressure remains on the low side but otherwise hemodynamically stable.  Continue Lovenox and Coumadin.  Abdominal tenderness/large hiatal hernia/ ?  Possible acute colitis: Due to him complaining of abdominal pain and abdominal tenderness on exam, initially concern was for possible ileus.  Abdominal x-ray was obtained which did not show obstruction.  CT of the abdomen and pelvis with contrast was obtained which shows large hiatal hernia with the entire stomach herniated into the lower chest.  No diverticulitis but mild wall thickening of transverse colon.  Based on the exams finding which had upper abdominal tenderness, I think this is possible colitis and should be treated and thus he was started on Zosyn which I will continue.   Hypernatremia: Acute but mild and resolved.  Hypokalemia: Resolved.  Prolonged QT interval: Acute.  QTC elevated at 538. -Recheck EKG in a.m. -Hold QT prolonging medication  Paroxysmal atrial fibrillation: Started on p.o. amiodarone by cardiology on 12/01/2019.  On Coumadin and Lovenox.  Systolic congestive heart failure: Patient appears to be hypovolemic at this time.  EF noted to be 20-25% on 12/7.  BMP initially had been 2174.  Seen by cardiology.  No concerns expressed.  Looks dry.  Torsemide initially was held due to AKI and dehydration.  He was started on torsemide half dose which will be 20 mg p.o. daily on 11/30/2019.  Continue that.   Essential hypertension: On admission patient's blood pressures were noted to be soft  90/72.  It appears after being discharged from the hospital last there were several changes made including discontinuation of 12.5 mg of metoprolol succinate and losartan changed to 25 mg twice daily.  His blood pressure continues to be soft despite of holding antihypertensives.  We will continue to hold them and watch closely.    Recent nonhemorrhagic CVA: Patient was recently hospitalized for distal basal artery occlusion with thready flow to the right PCA requiring interventional radiology for thrombectomy. -Continue Plavix  -Dysphagia 3 diet -Aspiration precautions  Normocytic anemia: Hemoglobin stable.  Anxiety -Held Lexapro due to prolonged QT  -Continue Xanax as needed  Hypothyroidism: TSH 2.833 on 11/27. -Continue levothyroxine  Hyperlipidemia -Continue Crestor   DVT prophylaxis: Lovenox and Coumadin Code Status: DNR present on admission Family Communication:  None present at bedside when I first saw him.  Daughter came in later.  Talk to her in person.  She is in agreement with consulting palliative care. Disposition Plan: Consulted palliative care today.  May transition to hospice.  Estimated body mass index is 21.88 kg/m as calculated from the following:   Height as of this encounter: 5\' 6"  (1.676 m).   Weight as of this encounter:  61.5 kg.      Nutritional status:  Nutrition Problem: Severe Malnutrition Etiology: chronic illness(CHF)   Signs/Symptoms: percent weight loss, moderate muscle depletion, severe muscle depletion, moderate fat depletion, severe fat depletion(22.6 lb wt loss in 4 months) Percent weight loss: 14.3 %   Interventions: Ensure Enlive (each supplement provides 350kcal and 20 grams of protein), Magic cup, Refer to RD note for recommendations, Hormel Shake    Consultants:   Cardiology  Procedures:   None  Antimicrobials:   Zosyn today   Subjective: Patient seen and examined this morning.  He was too lethargic to hold any  conversation.  Continued to have cough.  Objective: Vitals:   12/02/19 2009 12/03/19 0303 12/03/19 0700 12/03/19 1136  BP: 94/82 (!) 89/72 102/63 107/70  Pulse: (!) 119 (!) 59  (!) 59  Resp: 20 20  20   Temp: 98.1 F (36.7 C) 97.7 F (36.5 C)  (!) 97.5 F (36.4 C)  TempSrc: Oral Oral  Oral  SpO2: 100% 100%  100%  Weight:  61.5 kg    Height:        Intake/Output Summary (Last 24 hours) at 12/03/2019 1535 Last data filed at 12/03/2019 1100 Gross per 24 hour  Intake 1263 ml  Output 600 ml  Net 663 ml   Filed Weights   12/01/19 0155 12/02/19 0511 12/03/19 0303  Weight: 62.3 kg 61.6 kg 61.5 kg    Examination:  General exam: Coughing Respiratory system: ?  Rhonchi. Respiratory effort normal. Cardiovascular system: S1 & S2 heard, irregularly irregular rate and rhythm, no JVD, murmurs, rubs, gallops or clicks. No pedal edema. Gastrointestinal system: Abdomen is nondistended, soft and nontender. No organomegaly or masses felt. Normal bowel sounds heard. Central nervous system: Lethargic Extremities: Symmetric 5 x 5 power. Skin: No rashes, lesions or ulcers.  Psychiatry: Unable to assess    Data Reviewed: I have personally reviewed following labs and imaging studies  CBC: Recent Labs  Lab 11/28/19 1320 11/29/19 0930 11/30/19 0215 12/01/19 0738 12/02/19 0437 12/03/19 0321  WBC 10.7* 12.3* 9.7 9.7 14.0* 11.4*  NEUTROABS 8.7* 9.9*  --   --   --   --   HGB 12.0* 11.8* 9.9* 11.3* 11.5* 10.8*  HCT 39.2 39.0 31.3* 34.9* 36.3* 32.7*  MCV 99.5 98.5 95.7 92.6 93.3 91.6  PLT 370 370 289 281 305 [id number]   Basic Metabolic Panel: Recent Labs  Lab 11/29/19 0439 11/29/19 1316 11/30/19 1033 12/01/19 0738 12/02/19 0818 12/03/19 0321  NA  --  142 138 140 135 133*  K  --  3.2* 4.1 3.0* 3.0* 3.5  CL  --  104 108 103 97* 101  CO2  --  23 20* 26 25 24   GLUCOSE  --  158* 165* 157* 175* 174*  BUN  --  23 18 20 22  24*  CREATININE  --  0.97 0.77 0.92 1.02 0.95  CALCIUM  --  8.9  8.7* 8.1* 8.2* 8.0*  MG 1.8  --   --   --   --  1.3*   GFR: Estimated Creatinine Clearance: 45.9 mL/min (by C-G formula based on SCr of 0.95 mg/dL). Liver Function Tests: Recent Labs  Lab 11/28/19 1320  AST 16  ALT 14  ALKPHOS 82  BILITOT 1.2  PROT 6.5  ALBUMIN 2.9*   No results for input(s): LIPASE, AMYLASE in the last 168 hours. No results for input(s): AMMONIA in the last 168 hours. Coagulation Profile: Recent Labs  Lab 12/02/19 0437 12/03/19 0321  INR 1.2 1.4*   Cardiac Enzymes: No results for input(s): CKTOTAL, CKMB, CKMBINDEX, TROPONINI in the last 168 hours. BNP (last 3 results) No results for input(s): PROBNP in the last 8760 hours. HbA1C: No results for input(s): HGBA1C in the last 72 hours. CBG: Recent Labs  Lab 11/28/19 2216 11/29/19 0622 12/02/19 0842  GLUCAP 138* 159* 168*   Lipid Profile: No results for input(s): CHOL, HDL, LDLCALC, TRIG, CHOLHDL, LDLDIRECT in the last 72 hours. Thyroid Function Tests: No results for input(s): TSH, T4TOTAL, FREET4, T3FREE, THYROIDAB in the last 72 hours. Anemia Panel: No results for input(s): VITAMINB12, FOLATE, FERRITIN, TIBC, IRON, RETICCTPCT in the last 72 hours. Sepsis Labs: Recent Labs  Lab 11/29/19 1316 12/02/19 0942 12/02/19 1124  LATICACIDVEN 1.5 1.4 1.3    Recent Results (from the past 240 hour(s))  SARS CORONAVIRUS 2 (TAT 6-24 HRS) Nasopharyngeal Nasopharyngeal Swab     Status: None   Collection Time: 11/28/19  6:30 PM   Specimen: Nasopharyngeal Swab  Result Value Ref Range Status   SARS Coronavirus 2 NEGATIVE NEGATIVE Final    Comment: (NOTE) SARS-CoV-2 target nucleic acids are NOT DETECTED. The SARS-CoV-2 RNA is generally detectable in upper and lower respiratory specimens during the acute phase of infection. Negative results do not preclude SARS-CoV-2 infection, do not rule out co-infections with other pathogens, and should not be used as the sole basis for treatment or other patient  management decisions. Negative results must be combined with clinical observations, patient history, and epidemiological information. The expected result is Negative. Fact Sheet for Patients: SugarRoll.be Fact Sheet for Healthcare Providers: https://www.woods-mathews.com/ This test is not yet approved or cleared by the [place] FDA and  has been authorized for detection and/or diagnosis of SARS-CoV-2 by FDA under an Emergency Use Authorization (EUA). This EUA will remain  in effect (meaning this test can be used) for the duration of the COVID-19 declaration under Section 56 4(b)(1) of the Act, 21 U.S.C. section 360bbb-3(b)(1), unless the authorization is terminated or revoked sooner. Performed at [facility], [place] [address], [place], [place] [address]       Radiology Studies: No results found.  Scheduled Meds: . alum & mag hydroxide-simeth  30 mL Oral Once  . amiodarone  400 mg Oral BID  . clopidogrel  75 mg Oral Daily  . enoxaparin (LOVENOX) injection  60 mg Subcutaneous BID  . feeding supplement (ENSURE ENLIVE)  237 mL Oral BID BM  . ferrous sulfate  325 mg Oral Q breakfast  . latanoprost  1 drop Both Eyes QHS  . levothyroxine  50 mcg Oral Q0600  . potassium chloride  30 mEq Oral Q6H  . rosuvastatin  20 mg Oral q1800  . sodium chloride flush  3 mL Intravenous Q12H  . torsemide  20 mg Oral Daily  . vitamin B-12  500 mcg Oral Daily  . warfarin  2.5 mg Oral ONCE-1800  . warfarin  2.5 mg Oral ONCE-1800  . Warfarin - Pharmacist Dosing Inpatient   Does not apply q1800   Continuous Infusions: . ceFEPime (MAXIPIME) IV 2 g (12/03/19 1207)  . dextrose 50 mL/hr at 11/30/19 1951  . metronidazole 500 mg (12/03/19 1417)     LOS: 4 days   Time spent: 35 minutes Darliss Cheney, MD [facility]  12/03/2019, 3:35 PM   To contact the attending provider between 7A-7P or the covering provider during after hours 7P-7A, please  log into the web site [email or web address] and use password [email or web address].

## 2019-12-03 NOTE — Progress Notes (Signed)
Progress Note  Patient Name: Kenneth [name] Date of Encounter: 12/03/2019  Primary Cardiologist: Sinclair Grooms, MD   Subjective   Complains of a aching all over, worse his back pain. Knows that he has to "do #2 ".  Complains that he is hurting so bad and feeling so bad that he wishes he could just die.  No suicidal ideation  Inpatient Medications    Scheduled Meds: . alum & mag hydroxide-simeth  30 mL Oral Once  . amiodarone  400 mg Oral BID  . clopidogrel  75 mg Oral Daily  . enoxaparin (LOVENOX) injection  60 mg Subcutaneous BID  . ferrous sulfate  325 mg Oral Q breakfast  . latanoprost  1 drop Both Eyes QHS  . levothyroxine  50 mcg Oral Q0600  . rosuvastatin  20 mg Oral q1800  . sodium chloride flush  3 mL Intravenous Q12H  . torsemide  20 mg Oral Daily  . vitamin B-12  500 mcg Oral Daily  . warfarin  2.5 mg Oral ONCE-1800  . Warfarin - Pharmacist Dosing Inpatient   Does not apply q1800   Continuous Infusions: . ceFEPime (MAXIPIME) IV 2 g (12/02/19 2138)  . dextrose 50 mL/hr at 11/30/19 1951  . magnesium sulfate bolus IVPB    . metronidazole 500 mg (12/02/19 2325)   PRN Meds: acetaminophen **OR** acetaminophen, albuterol, ALPRAZolam, ondansetron (ZOFRAN) IV, polyvinyl alcohol   Vital Signs    Vitals:   12/02/19 1630 12/02/19 2009 12/03/19 0303 12/03/19 0700  BP: 114/84 94/82 (!) 89/72 102/63  Pulse: (!) 125 (!) 119 (!) 59   Resp:  20 20   Temp:  98.1 F (36.7 C) 97.7 F (36.5 C)   TempSrc:  Oral Oral   SpO2:  100% 100%   Weight:   61.5 kg   Height:        Intake/Output Summary (Last 24 hours) at 12/03/2019 0825 Last data filed at 12/03/2019 0800 Gross per 24 hour  Intake 1763 ml  Output 600 ml  Net 1163 ml   Last 3 Weights 12/03/2019 12/02/2019 12/01/2019  Weight (lbs) 135 lb 9.3 oz 135 lb 12.9 oz 137 lb 5.6 oz  Weight (kg) 61.5 kg 61.6 kg 62.3 kg      Telemetry    Recorded is sinus tachycardia, but appears to potentially be A. fib  with RVR rates in the 110 120 bpm range.- Personally Reviewed  ECG    No current EKG (ordered today)- Personally Reviewed  Physical Exam   General appearance: alert, cooperative, mild distress, slowed mentation and Answers questions appropriately, but slowly. Neck: no adenopathy and thyroid not enlarged, symmetric, no tenderness/mass/nodules Lungs: clear to auscultation bilaterally and Diminished effort makes it difficult to assess. Heart: Tachycardic and somewhat irregular, but not clear if truly irregularly irregular.  Soft 1/6 SEM at RUSB. Abdomen: Scaphoid abdomen, somewhat tender to palpation.  Soft.  Nondistended. Extremities: extremities normal, atraumatic, no cyanosis or edema Pulses: 2+ and symmetric Palpable pulses bilateral pedal and radial but diminished. Skin: Pale, somewhat diffuse ecchymosis. Neurologic: Mental status: Alert, oriented, thought content appropriate, alertness: Seems to be tired and mildly lethargic but awake and alert, orientation: person, place, Not quite sure of date and time   Labs    High Sensitivity Troponin:   Recent Labs  Lab 11/12/19 1448 11/20/19 1411 11/28/19 1320 11/28/19 1500 11/29/19 1316  TROPONINIHS 9,411* 903* 235* 220* 197*      Chemistry Recent Labs  Lab 11/28/19 1320 12/01/19  [id number] 12/02/19 0818 12/03/19 0321  NA 147* 140 135 133*  K 3.5 3.0* 3.0* 3.5  CL 109 103 97* 101  CO2 18* 26 25 24   GLUCOSE 170* 157* 175* 174*  BUN 32* 20 22 24*  CREATININE 1.28* 0.92 1.02 0.95  CALCIUM 9.1 8.1* 8.2* 8.0*  PROT 6.5  --   --   --   ALBUMIN 2.9*  --   --   --   AST 16  --   --   --   ALT 14  --   --   --   ALKPHOS 82  --   --   --   BILITOT 1.2  --   --   --   GFRNONAA 49* >60 >60 >60  GFRAA 57* >60 >60 >60  ANIONGAP 20* 11 13 8      Hematology Recent Labs  Lab 12/01/19 0738 12/02/19 0437 12/03/19 0321  WBC 9.7 14.0* 11.4*  RBC 3.77* 3.89* 3.57*  HGB 11.3* 11.5* 10.8*  HCT 34.9* 36.3* 32.7*  MCV 92.6 93.3 91.6    MCH 30.0 29.6 30.3  MCHC 32.4 31.7 33.0  RDW 13.6 13.7 13.5  PLT 281 305 276    BNP Recent Labs  Lab 11/28/19 1320  BNP 1,428.2*     DDimer  Recent Labs  Lab 11/29/19 1316  DDIMER 2.39*     Radiology    No results found..  CTA Chest PE 11/29/2019: Acute PE with large occlusive thrombus in the left main PA including LLL pulmonary arterial branches.  Small nonocclusive thrombus in R middle lobe.  No RV strain.  PERCUTANEOUS INTRACRANIAL ARTERIAL THROMBECTOMY 11/19/2019: Endovascular revascularization of distal basilar artery and posterior cerebral arteries.  Cardiac Studies    Echocardiogram 11/19/2019: EF 20 to 25%.  Global HK.  Moderate MR.  Mild AS.  Moderate LA dilation.  RV function also moderately decreased.  Possible nonocclusive thrombus in the right femoral vein.  Large hiatal hernia with entire stomach herniated into the lower chest.  Chronic diverticulosis.  Sequela of chronic pancreatitis with stable small cystic lesion.  Aortic atherosclerosis.  Patient Profile     85 y.o. male with a relatively complicated history over the last month admitted from rehab with generalized weakness nausea and lethargy.  Noted to have acute PE on CT angiogram of the chest.  Thought to have generalized weakness secondary dehydration.  Rehydrated in the ER. -> History of CABG in 1993 with chronic combined systolic diastolic heart failure --> Admitted with inferior STEMI on 11/12/2019-occluded SVG-RCA with aspiration thrombectomy and extensive stenting with distalization, EF noted to be 20 to 25%. --> 11/19/2019 acute stroke with basilar artery and PCA occlusion status post thrombectomy  Assessment & Plan     Acute PE: Has been on Lovenox and warfarin,(pharmacist note indicates enoxaparin 60 mg twice daily and warfarin) need to confirm.  CAD-CABG and recent inferolateral STEMI: No active chest pain.  Did have extensive stent work done.  Would need to maintain clopidogrel without  aspirin.  (Having converted from ticagrelor to clopidogrel/Plavix after stroke)  Will need to restart low-dose statin prior to discharge (was on 20 mg simvastatin)  No active anginal pain, was on Imdur prior to his MI  Chronic combined systolic and diastolic heart failure: No acute signs or symptoms of heart failure.  Have not been able to use standard beta-blocker not blood reduction due to hypotension.  Seems to be euvolemic with no acute exacerbation.  Was previously on low-dose beta-blocker and  ARB r, holding for now, but may want to consider restarting low-dose Toprol (had been on 12.5 mg) for additional rate control pending A. fib response to amiodarone.  PAF: Appears actually to be in potential A. fib RVR currently.  Was started on 400 mg twice daily amiodarone.  If rates do increase, may consider least 1 IV bolus of 100 mg.  Will be on warfarin (better option with potential for bleed on Plavix).  Currently bridging with Lovenox..  Check EKG today.  Recent stroke: Will be on Plavix plus warfarin.  Would likely want to avoid hypotension, therefore holding antihypertensives. .  There have been issues with refusal of medications, and signs of progressive failure to thrive.  Dr. Percival Spanish suggested possible palliative care consultation     For questions or updates, please contact [facility] Please consult [email or web address] for contact info under        Signed, Glenetta Hew, MD  12/03/2019, 8:25 AM

## 2019-12-03 NOTE — Progress Notes (Signed)
Initial Nutrition Assessment  DOCUMENTATION CODES:   Severe malnutrition in context of chronic illness  INTERVENTION:  -Vital Cuisine with breakfast tray, each supplement provides 520 kcal and 22 grams of protein  -Ensure Enlive po BID, each supplement provides 350 kcal and 20 grams of protein (chocolate)  -Magic cup TID with meals, each supplement provides 290 kcal and 9 grams of protein (chocolate)  -Consider starting pt on appetite stimulant  NUTRITION DIAGNOSIS:   Severe Malnutrition related to chronic illness(CHF) as evidenced by percent weight loss, moderate muscle depletion, severe muscle depletion, moderate fat depletion, severe fat depletion(22.6 lb wt loss in 4 months).   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 Patient presents from rehab for generalized weakness, nausea, and lethargy.  Patient admitted with generalized weakness secondary to dehydration and acute pulmonary embolism.   Patient with very poor po intake, noted 0% po x 4 meals, 25% po x 1 meals. RD met with patient at bedside this morning. Patient resting, limited nutrition history obtained secondary to patient falling asleep during visit. Patient with minimal verbal responses to questions, most responses to yes or no questions with head nods. Patient stated that he was feeling a little hungry this morning and has not been eating because he does not like the food. Patient shook his head yes when asked if he liked ice cream. Patient shook his head yes when asked of he would drink nutrition supplement and reports  that he likes chocolate. Will continue to monitor for po intake of meals and supplements, if poor oral intake continues, recommend initiation of nutrition support.   RN arrived in patient room during RD visit; reports that patient refused medications this morning. RN denied chewing/swallowing difficulties and reports that pt stated that he just wanted to die. Patient again refusing medication while RD in room.   Current wt 61.5 kg (135.3 lbs) non-pitting BLE edema per 12/28  RN flowsheet.  Patient wts have been trending down over the past year, noted 22.6 lb (14.3%) wt loss in the past 4 months which is severe for time frame. On 2/10 pt wt 73.7 kg (162.1 lbs), 8/17 pt wt 72 kg (158.4 lbs), 11/27 pt wt 67 (147.4 lbs), 12/14 pt wt 67.2 kg (147.8 lb)  Patient meets criteria for malnutrition in the context of chronic illness given moderate/severe fat and muscle depletions noted on exam and severe wt losses. Patient agreeable to [employer] and Ensure nutrition supplement.   Medications reviewed and include: Ferrous sulfate, B12 Maxipime D5 @ 50 ml/hr Mg sulfate Flagyl Labs: Na 133 (L), Mg 1.3 (L), WBC 11.4 (H)  NUTRITION - FOCUSED PHYSICAL EXAM: 12/28 Findings: Severe fat depletion to orbital, upper arm, and buccal region regions; Moderate muscle depletion to temple region; Severe muscle depletion to clavicle, clavicle and acromion bone, and dorsal hand   Diet Order:   Diet Order            DIET DYS 3 Room service appropriate? Yes with Assist; Fluid consistency: Thin  Diet effective now  EDUCATION NEEDS:   Not appropriate for education at this time  Skin:  Skin Assessment: Reviewed RN Assessment  Last BM:  12/28  Height:   Ht Readings from Last 1 Encounters:  11/28/19 5' 6"  (1.676 m)    Weight: 135.3 lbs   Wt Readings from Last 1 Encounters:  12/03/19 61.5 kg    Ideal Body Weight:  64.5 kg  BMI:  Body mass index is 21.88 kg/m.  Estimated Nutritional Needs:    Kcal:  1550-1750  Protein:  78-88  Fluid:  >/= 1.5 L/day    Lajuan Lines, RD, LDN Clinical Nutrition [facility] Telephone [phone] After Hours/Weekend Pager: [phone]

## 2019-12-03 NOTE — Progress Notes (Signed)
Patient has refused all medications during my shift. He says he knows the complications of not taking his medications. His daughter was at the bedside when he expressed this. Patient stated "I am suffocating", increased his 3L to 4L O2 100%, refused pain med for comfort, notify respiratory therapist on duty to assess and provide prn breathing treatment.

## 2019-12-04 DIAGNOSIS — I480 Paroxysmal atrial fibrillation: Secondary | ICD-10-CM

## 2019-12-04 DIAGNOSIS — R638 Other symptoms and signs concerning food and fluid intake: Secondary | ICD-10-CM

## 2019-12-04 DIAGNOSIS — I25810 Atherosclerosis of coronary artery bypass graft(s) without angina pectoris: Secondary | ICD-10-CM

## 2019-12-04 DIAGNOSIS — Z515 Encounter for palliative care: Secondary | ICD-10-CM

## 2019-12-04 DIAGNOSIS — Z8673 Personal history of transient ischemic attack (TIA), and cerebral infarction without residual deficits: Secondary | ICD-10-CM

## 2019-12-04 DIAGNOSIS — Z7189 Other specified counseling: Secondary | ICD-10-CM

## 2019-12-04 DIAGNOSIS — I2699 Other pulmonary embolism without acute cor pulmonale: Principal | ICD-10-CM

## 2019-12-04 DIAGNOSIS — I10 Essential (primary) hypertension: Secondary | ICD-10-CM

## 2019-12-04 DIAGNOSIS — I5022 Chronic systolic (congestive) heart failure: Secondary | ICD-10-CM

## 2019-12-04 DIAGNOSIS — E860 Dehydration: Secondary | ICD-10-CM

## 2019-12-04 LAB — CBC
HCT: 35.9 % — ABNORMAL LOW (ref 39.0–52.0)
Hemoglobin: 11.5 g/dL — ABNORMAL LOW (ref 13.0–17.0)
MCH: 29.9 pg (ref 26.0–34.0)
MCHC: 32 g/dL (ref 30.0–36.0)
MCV: 93.2 fL (ref 80.0–100.0)
Platelets: 338 10*3/uL (ref 150–400)
RBC: 3.85 MIL/uL — ABNORMAL LOW (ref 4.22–5.81)
RDW: 13.8 % (ref 11.5–15.5)
WBC: 11.8 10*3/uL — ABNORMAL HIGH (ref 4.0–10.5)
nRBC: 0 % (ref 0.0–0.2)

## 2019-12-04 LAB — PROTIME-INR
INR: 1.3 — ABNORMAL HIGH (ref 0.8–1.2)
Prothrombin Time: 16.1 seconds — ABNORMAL HIGH (ref 11.4–15.2)

## 2019-12-04 LAB — MAGNESIUM: Magnesium: 2.4 mg/dL (ref 1.7–2.4)

## 2019-12-04 MED ORDER — PROMETHAZINE HCL 25 MG/ML IJ SOLN
12.5000 mg | Freq: Four times a day (QID) | INTRAMUSCULAR | Status: DC
  Administered 2019-12-04: 18:00:00 12.5 mg via INTRAVENOUS
  Filled 2019-12-04: qty 1

## 2019-12-04 MED ORDER — GLYCOPYRROLATE 0.2 MG/ML IJ SOLN
0.4000 mg | Freq: Four times a day (QID) | INTRAMUSCULAR | Status: DC
  Administered 2019-12-04 – 2019-12-05 (×4): 0.4 mg via INTRAVENOUS
  Filled 2019-12-04 (×5): qty 2

## 2019-12-04 MED ORDER — ACETAMINOPHEN 650 MG RE SUPP: 650.0000 mg | Freq: Four times a day (QID) | RECTAL | Status: DC | PRN

## 2019-12-04 MED ORDER — SODIUM CHLORIDE 0.9 % IV SOLN
INTRAVENOUS | Status: DC | PRN
  Administered 2019-12-04 (×2): 1000 mL via INTRAVENOUS

## 2019-12-04 MED ORDER — PROMETHAZINE HCL 25 MG/ML IJ SOLN
12.5000 mg | Freq: Four times a day (QID) | INTRAMUSCULAR | Status: DC | PRN
  Administered 2019-12-04: 12.5 mg via INTRAVENOUS
  Filled 2019-12-04: qty 1

## 2019-12-04 MED ORDER — WARFARIN SODIUM 2.5 MG PO TABS: 2.5000 mg | ORAL_TABLET | Freq: Once | ORAL | Status: DC

## 2019-12-04 MED ORDER — MORPHINE SULFATE (PF) 2 MG/ML IV SOLN: 2.0000 mg | INTRAVENOUS | Status: DC | PRN

## 2019-12-04 MED ORDER — MORPHINE SULFATE (PF) 2 MG/ML IV SOLN
2.0000 mg | Freq: Four times a day (QID) | INTRAVENOUS | Status: DC
  Administered 2019-12-04: 2 mg via INTRAVENOUS
  Filled 2019-12-04: qty 1

## 2019-12-04 MED ORDER — BIOTENE DRY MOUTH MT LIQD: 15.0000 mL | OROMUCOSAL | Status: DC | PRN

## 2019-12-04 MED ORDER — MORPHINE SULFATE (PF) 2 MG/ML IV SOLN
2.0000 mg | INTRAVENOUS | Status: DC | PRN
  Administered 2019-12-05 (×2): 2 mg via INTRAVENOUS
  Filled 2019-12-04 (×2): qty 1

## 2019-12-04 MED ORDER — LORAZEPAM 2 MG/ML IJ SOLN: 0.5000 mg | INTRAMUSCULAR | Status: DC | PRN

## 2019-12-04 MED ORDER — WARFARIN SODIUM 5 MG PO TABS: 5.0000 mg | ORAL_TABLET | Freq: Once | ORAL | Status: DC

## 2019-12-04 NOTE — Consult Note (Signed)
Consultation Note Date: 12/04/2019   Patient Name: Kenneth [name]  DOB: 12-10-1927  MRN: [id number]  Age / Sex: 85 y.o., male  PCP: Lavone Orn, MD Referring Physician: Darliss Cheney, MD  Reason for Consultation: Establishing goals of care and Hospice Evaluation  HPI/Patient Profile: 85 y.o. 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 He has continued to decline over hospitalization with lethargy and poor intake and not taking medications.   Clinical Assessment and Goals of Care: I met today at Kenneth [name] bedside but no family present. He appears weak, fatigued, miserable. He is coughing and spitting up thick sputum and reports nausea and pain. He is able to answer yes/no questions inconsistently due to feeling so poorly. He did give me permission to call his daughter, Daleen Snook.  I called and spoke with [place]. She shares that her father has struggled with nausea and poor intake ever since his heart attack and has continued to decline since. Prior to Thanksgiving he was caring for his wife with dementia, driving, cooking and fully functional. Daleen Snook is now caring for her mother. Daleen Snook shares that her father has told her that he is dying and that he is ready to die. We discussed full comfort care and transition to hospice facility, [facility], and Daleen Snook agrees this will be best for her father. She doesn't want him to suffer any more. I informed her that I will work out visitation for herself and her mother. I will also adjust medications to better allow for comfort and relief acknowledging  this will likely make him more lethargic/sedated and less interactive with [place].   I called AC and front desk to add wife and daughter to visitation list and make EOL exception. All questions/concerns addressed. Update given to Dr. Doristine Bosworth, RN Kirke Shaggy, and CSW [place].   Primary Decision Maker NEXT OF KIN daughter Daleen Snook    SUMMARY OF RECOMMENDATIONS   - Full comfort care  Code Status/Advance Care Planning:  DNR   Symptom Management:   Pain/SOB: Morphine 2 mg every 6 hours and 2-4 mg IV every hour prn.   Nausea: Phenergan 12.5 mg IV every 6 hours scheduled.   Secretions: Robinul 0.4 mg IV every 6 hours scheduled.   Maintain intra-abd antibiotics for now (this will be d/c upon transition to hospice).   D5W IVF cut in half and plan to stop tomorrow to allow for family visitation (although I do not see any concern for hypoglycemia/hyponatremia in records).   Palliative Prophylaxis:   Aspiration, Bowel Regimen, Delirium Protocol, Frequent Pain Assessment, Oral Care and Turn Reposition  Additional Recommendations (Limitations, Scope, Preferences):  Full Comfort Care  Psycho-social/Spiritual:   Desire for further Chaplaincy support:yes  Additional Recommendations: Education  on Hospice and Grief/Bereavement Support  Prognosis:   < 2 weeks  Discharge Planning: Hospice facility      Primary Diagnoses: Present on Admission: . Acute kidney injury ([place]) . (Resolved) AKI (acute kidney injury) ([place]) . Prolonged QT interval . Coronary artery disease involving coronary bypass graft of native heart without angina pectoris . HTN (hypertension) . Chronic systolic CHF (congestive heart failure) ([place]) . Normocytic anemia . DNR (do not resuscitate) . Dehydration . Acute kidney injury superimposed on chronic kidney disease ([place])   I have reviewed the medical record, interviewed the patient and family, and examined the patient. The following aspects are pertinent.  Past Medical  History:  Diagnosis Date  . Anemia    on iron  . Anxiety   . Arthritis   . Basal cell carcinoma    left ear  . CAD (coronary artery disease)    a. CAD s/p CABG 1993.  [name] Chronic cough   . Chronic rhinitis   . Complication of anesthesia   . Diabetes ([place])   . Diverticulosis of colon   . GERD (gastroesophageal reflux disease)   . Glaucoma   . H/O hiatal hernia   . Hernia    left side  . History of gout   . History of kidney stones   . HTN (hypertension)   . Hyperlipidemia `  . Melanoma of skin, site unspecified   . PONV (postoperative nausea and vomiting)   . [facility] spotted fever 2-3 yrs ago  . Thyroid disease    Social History   Socioeconomic History  . Marital status: Married    Spouse name: Not on file  . Number of children: Not on file  . Years of education: Not on file  . Highest education level: Not on file  Occupational History  . Not on file  Tobacco Use  . Smoking status: Former Smoker    Types: Cigars  . Smokeless tobacco: Never Used  Substance and Sexual Activity  . Alcohol use: Yes    Comment: RARE  . Drug use: No    Comment: occ cigars  . Sexual activity: Not on file  Other Topics Concern  . Not on file  Social History Narrative  . Not on file   Social Determinants of Health   Financial Resource Strain:   . Difficulty of Paying Living Expenses: Not on file  Food Insecurity:   . Worried About Charity fundraiser in the Last Year: Not on file  . Ran Out of Food in the Last Year: Not on file  Transportation Needs:   . Lack of Transportation (Medical): Not on file  . Lack of Transportation (Non-Medical): Not on file  Physical Activity:   . Days of Exercise per Week: Not on file  . Minutes of Exercise per Session: Not on file  Stress:   . Feeling of Stress : Not on file  Social Connections:   . Frequency of Communication with Friends and Family: Not on file  . Frequency of Social Gatherings with Friends and Family: Not on file  .  Attends Religious Services: Not on file  . Active Member of Clubs or Organizations: Not on file  . Attends Archivist Meetings: Not on file  . Marital Status: Not on file   Family History  Problem Relation Age of Onset  . CAD Father   . Hyperlipidemia Father   . Cancer Mother    Scheduled Meds: . alum & mag hydroxide-simeth  30  mL Oral Once  . amiodarone  400 mg Oral BID  . clopidogrel  75 mg Oral Daily  . enoxaparin (LOVENOX) injection  60 mg Subcutaneous BID  . feeding supplement (ENSURE ENLIVE)  237 mL Oral BID BM  . ferrous sulfate  325 mg Oral Q breakfast  . latanoprost  1 drop Both Eyes QHS  . levothyroxine  50 mcg Oral Q0600  . rosuvastatin  20 mg Oral q1800  . sodium chloride flush  3 mL Intravenous Q12H  . torsemide  20 mg Oral Daily  . vitamin B-12  500 mcg Oral Daily  . warfarin  2.5 mg Oral ONCE-1800  . warfarin  2.5 mg Oral ONCE-1800  . Warfarin - Pharmacist Dosing Inpatient   Does not apply q1800   Continuous Infusions: . sodium chloride 1,000 mL (12/04/19 1535)  . ceFEPime (MAXIPIME) IV 2 g (12/04/19 1003)  . dextrose 50 mL/hr at 11/30/19 1951  . metronidazole 500 mg (12/04/19 1536)   PRN Meds:.sodium chloride, acetaminophen **OR** acetaminophen, albuterol, ALPRAZolam, morphine injection, ondansetron (ZOFRAN) IV, polyvinyl alcohol, promethazine Allergies  Allergen Reactions  . Lasix [Furosemide] Other (See Comments)    aches  . Spironolactone Other (See Comments)    Red, puffy, eyes, almost hospitalized  . Aspirin Other (See Comments)    Bleeding.    . Lipitor [Atorvastatin] Other (See Comments)    tired  . Lopressor [Metoprolol Tartrate] Other (See Comments)    Sleepy all the time 11.27.2020 Patient is currently taking Lopressor.  [name] Penicillins Hives and Rash    Has patient had a PCN reaction causing immediate rash, facial/tongue/throat swelling, SOB or lightheadedness with hypotension: No Has patient had a PCN reaction causing severe rash  involving mucus membranes or skin necrosis: No Has patient had a PCN reaction that required hospitalization No Has patient had a PCN reaction occurring within the last 10 years: Yes If all of the above answers are "NO", then may proceed with Cephalosporin use.    Review of Systems  Unable to perform ROS: Acuity of condition    Physical Exam Constitutional:      Appearance: He is ill-appearing.     Comments: Pale, thin, frail, elderly  Pulmonary:     Effort: Pulmonary effort is normal. No tachypnea, accessory muscle usage or respiratory distress.  Abdominal:     Palpations: Abdomen is soft.  Neurological:     Mental Status: He is alert.     Comments: Fatigued - difficult to fully assess orientation     Vital Signs: BP 120/88   Pulse (!) 130   Temp (!) 97.4 F (36.3 C) (Oral)   Resp 18   Ht 5' 6" (1.676 m)   Wt 61 kg   SpO2 100%   BMI 21.71 kg/m  Pain Scale: 0-10   Pain Score: 0-No pain   SpO2: SpO2: 100 % O2 Device:SpO2: 100 % O2 Flow Rate: .O2 Flow Rate (L/min): 4 L/min  IO: Intake/output summary:   Intake/Output Summary (Last 24 hours) at 12/04/2019 1616 Last data filed at 12/04/2019 0600 Gross per 24 hour  Intake 100.14 ml  Output 350 ml  Net -249.86 ml    LBM: Last BM Date: 12/04/19 Baseline Weight: Weight: 63.5 kg Most recent weight: Weight: 61 kg     Palliative Assessment/Data: 20%     Time In: 1615 Time Out: 1715 Time Total: 60 min Greater than 50%  of this time was spent counseling and coordinating care related to the above assessment and plan.  Signed by: Vinie Sill, NP Palliative Medicine Team Pager # [phone] (M-F 8a-5p) Team Phone # [phone] (Nights/Weekends)

## 2019-12-04 NOTE — Progress Notes (Signed)
Patient vomited twice, PRN ondansetron given at 09:41. Patient refused all morning oral medications and states "I am sick to my stomach". [place], MD at bedside. Verbal orders received to make patient diet NPO and order SLP consult. Nurse carried out orders.

## 2019-12-04 NOTE — Progress Notes (Signed)
SLP Cancellation Note  Patient Details Name: NOLE MONAHAN MRN: [id number] DOB: [birth year]   Cancelled treatment:       Reason Eval/Treat Not Completed: Other (comment)(nausea limiting ability to participate, pt reports that he feels too nauseated to participate in a swallow evaluation at this time.  Pt also lethargic.  Discussed with RN that should pt experience improvement in his symptoms prior to SLP's return that starting him on a clear liquids diet may be a safe way to re-initiate PO intake.  SLP will continue to follow along for evaluation as appropriate.)   Verneice Caspers, Selinda Orion 12/04/2019, 12:42 PM

## 2019-12-04 NOTE — Progress Notes (Signed)
   Vital Signs MEWS/VS Documentation      12/03/2019 2045 12/04/2019 0359 12/04/2019 0700 12/04/2019 0954   MEWS Score:  0  2  2  2    MEWS Score Color:  Green  Yellow  Yellow  Yellow   Resp:  --  20  --  --   Pulse:  --  (!) 118  --  (!) 125   BP:  --  103/85  --  112/81   Temp:  --  (!) 97.3 F (36.3 C)  --  --   O2 Device:  --  Nasal Cannula  --  --   Level of Consciousness:  Alert  --  --  --       Non-acute.    [place] 12/04/2019,10:10 AM

## 2019-12-04 NOTE — Progress Notes (Signed)
Kenneth [name] for enoxaparin and warfarin Indication: Pulmonary Embolus  Allergies  Allergen Reactions  . Lasix [Furosemide] Other (See Comments)    aches  . Spironolactone Other (See Comments)    Red, puffy, eyes, almost hospitalized  . Aspirin Other (See Comments)    Bleeding.    . Lipitor [Atorvastatin] Other (See Comments)    tired  . Lopressor [Metoprolol Tartrate] Other (See Comments)    Sleepy all the time 11.27.2020 Patient is currently taking Lopressor.  [name] Penicillins Hives and Rash    Has patient had a PCN reaction causing immediate rash, facial/tongue/throat swelling, SOB or lightheadedness with hypotension: No Has patient had a PCN reaction causing severe rash involving mucus membranes or skin necrosis: No Has patient had a PCN reaction that required hospitalization No Has patient had a PCN reaction occurring within the last 10 years: Yes If all of the above answers are "NO", then may proceed with Cephalosporin use.     Patient Measurements: Height: 5\' 6"  (167.6 cm) Weight: 134 lb 7.7 oz (61 kg) IBW/kg (Calculated) : 63.8 Heparin Dosing Weight: 63.4 kg  Vital Signs: Temp: 97.4 F (36.3 C) (12/29 1221) Temp Source: Oral (12/29 1221) BP: 120/88 (12/29 1221) Pulse Rate: 130 (12/29 1221)  Labs: Recent Labs    12/02/19 0437 12/02/19 0818 12/03/19 0321 12/04/19 0545  HGB 11.5*  --  10.8* 11.5*  HCT 36.3*  --  32.7* 35.9*  PLT 305  --  276 338  LABPROT 15.4*  --  16.6* 16.1*  INR 1.2  --  1.4* 1.3*  CREATININE  --  1.02 0.95  --     Estimated Creatinine Clearance: 45.5 mL/min (by C-G formula based on SCr of 0.95 mg/dL).   Assessment: 85 yr old male admitted on 11/28/19 from rehab for generalized weakness, nausea and lethargy. 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 Pt was also recently hospitalized (11/19/19) for CVA with distal basal artery occlusion, requiring IR for  thrombectomy.  CT positive for acute PE and also showed possible nonocclusive thrombus in the right femoral vein. Patient was on no anticoagulants PTA. Patient initially started on once daily 1.5mg /kg Lovenox to reduce the daily number of injections but will now transition to 1mg /kg twice daily to optimize therapy.  INR still below goal this AM - patient refusing all meds (including warfarin dose last night and lovenox injections)  Goal of Therapy:  Monitor platelets by anticoagulation protocol: Yes INR  2-3  Plan:  Enoxaparin SQ 60mg  BID Warfarin 2.5 mg once tonight (if he will take) Monitor CBC and INR Monitor for signs/symptoms of bleeding  Thank you for involving pharmacy in this patient's care.  Kenneth [name], [facility] Clinical Pharmacist Phone [phone]  12/04/2019 12:50 PM

## 2019-12-04 NOTE — Plan of Care (Signed)
  Problem: Pain Managment: Goal: General experience of comfort will improve Outcome: Progressing   Problem: Activity: Goal: Risk for activity intolerance will decrease Outcome: Not Progressing   Problem: Nutrition: Goal: Adequate nutrition will be maintained Outcome: Not Progressing   Problem: Skin Integrity: Goal: Risk for impaired skin integrity will decrease Outcome: Not Progressing

## 2019-12-04 NOTE — Progress Notes (Signed)
PROGRESS NOTE    HALFORD SPAYD  [id number] DOB: 10-Jan-1928 DOA: 11/28/2019 PCP: Lavone Orn, MD   Brief Narrative:  HPI: Kenneth [name] is a 85 y.o. 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  Patient presented from rehab for generalized weakness, nausea, and lethargy. Patient has had 2 hospitalizations at this month after suffering a STEMI and thereafter readmitted after having a stroke.   Upon admission to the emergency department patient was seen to be afebrile, heart rate 71-127, respirations 15-43, blood pressures 98/76 ~124/89, and O2 saturations maintained on room air.  Labs significant for WBC 10.7, hemoglobin 12, sodium 147, BUN 32, creatinine 1.28, glucose 170, troponin II 35, and BNP 1428.2.  COVID-19 screening was negative on 12/18.   Initial EKG gave concern for ST elevations and cardiology was consulted.  Patient was given 500 mL bolus of normal saline IV fluids. TRH called to admit.  STEMI was canceled since cardiology decided that there was no ST elevation MI.  He was subsequently admitted under hospitalist service.  Due to dehydration he was started on IV fluids.  Also came in with acute kidney injury which subsequently resolved.  Due to chest pain, D-dimer was obtained which was elevated and subsequently CT angiogram was obtained when his renal function was back to normal so he was diagnosed with large burden of left-sided PE and right lower PE.  He was started on heparin drip and subsequently switched to Lovenox along with Coumadin per cardiology recommendation.  Cardiology has been following along.  Since last 3 days, patient has been lethargic and have given up eating, drinking and has been refusing medications.  I had a discussion with patient's daughter on 12/03/2019 and we decided to pursue  palliative and possibly hospice care.  Palliative was consulted yesterday.  Assessment & Plan:   Principal Problem:   Acute pulmonary embolism (HCC) Active Problems:   HTN (hypertension)   Coronary artery disease involving coronary bypass graft of native heart without angina pectoris   Acute kidney injury (HCC)   Prolonged QT interval   Chronic systolic CHF (congestive heart failure) (HCC)   History of CVA (cerebrovascular accident)   Normocytic anemia   DNR (do not resuscitate)   Generalized weakness   Dehydration   Acute kidney injury superimposed on chronic kidney disease ([place])   Acute colitis   Community acquired pneumonia   Protein-calorie malnutrition, severe   Generalized weakness secondary to dehydration: Patient presents with generalized weakness after recently being discharged from the hospital.  He was dehydrated and received IV fluids.  He is lethargic today but had no other complaint.  Lactic acid normal.  Afebrile.  Leukocytosis improved a little bit.  Lethargy is likely due to lack of nutrition.  Continue dextrose IV fluids.Geanie Cooley dietitian.  PT OT on board.  They recommend SNF.  Patient continues to refuse food, meds.  He has been refusing Coumadin and also intermittently refusing Lovenox.  After discussion with the daughter, palliative care was consulted on 12/03/2019.  Waiting for palliative to see him.  Acute kidney injury: Patient's creatinine noted to be 1.28, but previously was 0.75 at discharge on the 19th.  Likely due to dehydration.  Now resolved.  CAD, elevated troponin/acute bilateral pulmonary embolism: Patient just recently hospitalized with a STEMI requiring PCI with DES to the SVG-PDA  on 12/7.  Troponins 903 on 12/15, but repeat here 235->230 which appear to be tracking down appropriately.  EKG without any significant changes and patient not complaining of any chest pain.  Seen by cardiology and they are not concerned about this.  When I saw him on  the morning of 11/29/2019, he did not have any chest pain.  However I was called later by nurse that he was having some chest pain.  EKG repeated did not show any changes.  We ordered another troponin which was 197, decreasing from the previous.  D-dimer was obtained which was elevated raising concern for possible PE.  CT angiogram stat was ordered and it was positive for acute pulmonary embolism with a large occlusive thrombus within the left main pulmonary artery occluding the left lower lobe pulmonary arterial branches. Small nonocclusive thrombus in the right middle lobe segmental branches. No evidence of right heart strain. patient was started on heparin IV right away.  Since he was hemodynamically stable so there was no indication for thrombectomy.  He was subsequently switched to Lovenox along with Coumadin however per pharmacy, he has been refusing Coumadin and also intermittently refusing Lovenox.  Remains lethargic.  Abdominal tenderness/large hiatal hernia/ ?  Possible acute colitis: Due to him complaining of abdominal pain and abdominal tenderness on exam, initially concern was for possible ileus.  Abdominal x-ray was obtained which did not show obstruction.  CT of the abdomen and pelvis with contrast was obtained which shows large hiatal hernia with the entire stomach herniated into the lower chest.  No diverticulitis but mild wall thickening of transverse colon.  Based on the exams finding which had upper abdominal tenderness, I think this is possible colitis and should be treated and thus he was started on Zosyn which I will continue.   Hypernatremia: Acute but mild and resolved.  Hypokalemia: Resolved.  Prolonged QT interval: Acute.  QTC elevated at 538. -Recheck EKG in a.m. -Hold QT prolonging medication  Paroxysmal atrial fibrillation: Started on p.o. amiodarone by cardiology on 12/01/2019.  On Coumadin and Lovenox which he has been refusing.  Systolic congestive heart failure:  Patient appears to be hypovolemic at this time.  EF noted to be 20-25% on 12/7.  BMP initially had been 2174.  Seen by cardiology.  No concerns expressed.  Looks dry.  Torsemide initially was held due to AKI and dehydration.  He was started on torsemide half dose which will be 20 mg p.o. daily on 11/30/2019.   Essential hypertension: On admission patient's blood pressures were noted to be soft 90/72.  It appears after being discharged from the hospital last there were several changes made including discontinuation of 12.5 mg of metoprolol succinate and losartan changed to 25 mg twice daily.  His blood pressure continues to be either soft or within normal range despite of holding antihypertensives.  We will continue to hold them and watch closely.    Recent nonhemorrhagic CVA: Patient was recently hospitalized for distal basal artery occlusion with thready flow to the right PCA requiring interventional radiology for thrombectomy. -Continue Plavix  -Dysphagia 3 diet -Aspiration precautions  Normocytic anemia: Hemoglobin stable.  Anxiety -Held Lexapro due to prolonged QT  -Continue Xanax as needed  Hypothyroidism: TSH 2.833 on 11/27. -Continue levothyroxine  Hyperlipidemia -Continue Crestor   DVT prophylaxis: Lovenox and Coumadin Code Status: DNR present on admission Family Communication: None present at the bedside.  Discussed in length with daughter at bedside yesterday. Disposition Plan: Consulted palliative care yesterday.  May transition to hospice.  Estimated body mass index is 21.71 kg/m as calculated from the following:   Height as of this encounter: 5\' 6"  (1.676 m).   Weight as of this encounter: 61 kg.      Nutritional status:  Nutrition Problem: Severe Malnutrition Etiology: chronic illness(CHF)   Signs/Symptoms: percent weight loss, moderate muscle depletion, severe muscle depletion, moderate fat depletion, severe fat depletion(22.6 lb wt loss in 4  months) Percent weight loss: 14.3 %   Interventions: Ensure Enlive (each supplement provides 350kcal and 20 grams of protein), Magic cup, Refer to RD note for recommendations, Hormel Shake    Consultants:   Cardiology  Procedures:   None  Antimicrobials:   Zosyn today   Subjective: Seen and examined.  Once again he was lethargic but slightly less than yesterday.  He was able to answer my questions today.  He was oriented x3.  Denied any other complaint other than just generalized weakness.  Also was nauseous a little bit.  Per nurse, he had vomited twice an hour before I saw him.  Objective: Vitals:   12/03/19 1958 12/04/19 0359 12/04/19 0954 12/04/19 1221  BP: 117/87 103/85 112/81 120/88  Pulse: 60 (!) 118 (!) 125 (!) 130  Resp: 18 20  18   Temp: 98.1 F (36.7 C) (!) 97.3 F (36.3 C)  (!) 97.4 F (36.3 C)  TempSrc: Oral Oral  Oral  SpO2: 100% 100% 100% 100%  Weight:  61 kg    Height:        Intake/Output Summary (Last 24 hours) at 12/04/2019 1343 Last data filed at 12/04/2019 0600 Gross per 24 hour  Intake 1281.33 ml  Output 350 ml  Net 931.33 ml   Filed Weights   12/02/19 0511 12/03/19 0303 12/04/19 0359  Weight: 61.6 kg 61.5 kg 61 kg    Examination:  General exam: Appears lethargic Respiratory system: Diminished breath sounds at the bases. Respiratory effort normal. Cardiovascular system: S1 & S2 heard, irregularly irregular rate and rhythm. No JVD, murmurs, rubs, gallops or clicks. No pedal edema. Gastrointestinal system: Abdomen is nondistended, soft and mild generalized tenderness. No organomegaly or masses felt. Normal bowel sounds heard. Central nervous system: Lethargic but oriented x3.  No focal deficit.[name] Extremities: Symmetric 5 x 5 power. Skin: No rashes, lesions or ulcers.  Psychiatry: Unable to assess.   Data Reviewed: I have personally reviewed following labs and imaging studies  CBC: Recent Labs  Lab 11/28/19 1320 11/29/19 0930  11/30/19 0215 12/01/19 0738 12/02/19 0437 12/03/19 0321 12/04/19 0545  WBC 10.7* 12.3* 9.7 9.7 14.0* 11.4* 11.8*  NEUTROABS 8.7* 9.9*  --   --   --   --   --   HGB 12.0* 11.8* 9.9* 11.3* 11.5* 10.8* 11.5*  HCT 39.2 39.0 31.3* 34.9* 36.3* 32.7* 35.9*  MCV 99.5 98.5 95.7 92.6 93.3 91.6 93.2  PLT 370 370 289 281 305 276 [id number]   Basic Metabolic Panel: Recent Labs  Lab 11/29/19 0439 11/29/19 1316 11/30/19 1033 12/01/19 0738 12/02/19 0818 12/03/19 0321 12/04/19 0545  NA  --  142 138 140 135 133*  --   K  --  3.2* 4.1 3.0* 3.0* 3.5  --   CL  --  104 108 103 97* 101  --   CO2  --  23 20* 26 25 24   --   GLUCOSE  --  158* 165* 157* 175* 174*  --   BUN  --  23 18 20 22  24*  --  CREATININE  --  0.97 0.77 0.92 1.02 0.95  --   CALCIUM  --  8.9 8.7* 8.1* 8.2* 8.0*  --   MG 1.8  --   --   --   --  1.3* 2.4   GFR: Estimated Creatinine Clearance: 45.5 mL/min (by C-G formula based on SCr of 0.95 mg/dL). Liver Function Tests: Recent Labs  Lab 11/28/19 1320  AST 16  ALT 14  ALKPHOS 82  BILITOT 1.2  PROT 6.5  ALBUMIN 2.9*   No results for input(s): LIPASE, AMYLASE in the last 168 hours. No results for input(s): AMMONIA in the last 168 hours. Coagulation Profile: Recent Labs  Lab 12/02/19 0437 12/03/19 0321 12/04/19 0545  INR 1.2 1.4* 1.3*   Cardiac Enzymes: No results for input(s): CKTOTAL, CKMB, CKMBINDEX, TROPONINI in the last 168 hours. BNP (last 3 results) No results for input(s): PROBNP in the last 8760 hours. HbA1C: No results for input(s): HGBA1C in the last 72 hours. CBG: Recent Labs  Lab 11/28/19 2216 11/29/19 0622 12/02/19 0842  GLUCAP 138* 159* 168*   Lipid Profile: No results for input(s): CHOL, HDL, LDLCALC, TRIG, CHOLHDL, LDLDIRECT in the last 72 hours. Thyroid Function Tests: No results for input(s): TSH, T4TOTAL, FREET4, T3FREE, THYROIDAB in the last 72 hours. Anemia Panel: No results for input(s): VITAMINB12, FOLATE, FERRITIN, TIBC, IRON,  RETICCTPCT in the last 72 hours. Sepsis Labs: Recent Labs  Lab 11/29/19 1316 12/02/19 0942 12/02/19 1124  LATICACIDVEN 1.5 1.4 1.3    Recent Results (from the past 240 hour(s))  SARS CORONAVIRUS 2 (TAT 6-24 HRS) Nasopharyngeal Nasopharyngeal Swab     Status: None   Collection Time: 11/28/19  6:30 PM   Specimen: Nasopharyngeal Swab  Result Value Ref Range Status   SARS Coronavirus 2 NEGATIVE NEGATIVE Final    Comment: (NOTE) SARS-CoV-2 target nucleic acids are NOT DETECTED. The SARS-CoV-2 RNA is generally detectable in upper and lower respiratory specimens during the acute phase of infection. Negative results do not preclude SARS-CoV-2 infection, do not rule out co-infections with other pathogens, and should not be used as the sole basis for treatment or other patient management decisions. Negative results must be combined with clinical observations, patient history, and epidemiological information. The expected result is Negative. Fact Sheet for Patients: SugarRoll.be Fact Sheet for Healthcare Providers: https://www.woods-mathews.com/ This test is not yet approved or cleared by the [place] FDA and  has been authorized for detection and/or diagnosis of SARS-CoV-2 by FDA under an Emergency Use Authorization (EUA). This EUA will remain  in effect (meaning this test can be used) for the duration of the COVID-19 declaration under Section 56 4(b)(1) of the Act, 21 U.S.C. section 360bbb-3(b)(1), unless the authorization is terminated or revoked sooner. Performed at [facility], [place] [address] [place], [place] [address]       Radiology Studies: No results found.  Scheduled Meds: . alum & mag hydroxide-simeth  30 mL Oral Once  . amiodarone  400 mg Oral BID  . clopidogrel  75 mg Oral Daily  . enoxaparin (LOVENOX) injection  60 mg Subcutaneous BID  . feeding supplement (ENSURE ENLIVE)  237 mL Oral BID BM  . ferrous sulfate   325 mg Oral Q breakfast  . latanoprost  1 drop Both Eyes QHS  . levothyroxine  50 mcg Oral Q0600  . rosuvastatin  20 mg Oral q1800  . sodium chloride flush  3 mL Intravenous Q12H  . torsemide  20 mg Oral Daily  .  vitamin B-12  500 mcg Oral Daily  . warfarin  2.5 mg Oral ONCE-1800  . warfarin  2.5 mg Oral ONCE-1800  . Warfarin - Pharmacist Dosing Inpatient   Does not apply q1800   Continuous Infusions: . sodium chloride 1,000 mL (12/04/19 1002)  . ceFEPime (MAXIPIME) IV 2 g (12/04/19 1003)  . dextrose 50 mL/hr at 11/30/19 1951  . metronidazole 500 mg (12/04/19 0559)     LOS: 5 days   Time spent: 30 minutes Darliss Cheney, MD [facility]  12/04/2019, 1:43 PM   To contact the attending provider between 7A-7P or the covering provider during after hours 7P-7A, please log into the web site [email or web address] and use password [email or web address].

## 2019-12-04 NOTE — Progress Notes (Signed)
Patient continues to have clear, green emesis and nausea. Pahwani, MD paged. Verbal orders received to place order for phenergan 12.5 mg IV every 6 hours as needed for nausea and vomiting. Nurse carried out verbal orders.

## 2019-12-04 NOTE — Progress Notes (Signed)
     Note reviewed.  As per last discussion.  Seems to be having significant failure to thrive.  I see the note from palliative care to the plan is to transition to full hospice.  Not an unreasonable situation given his recent MI, severe cardiomyopathy and stroke as well as now PE.  He has been somewhat sinus tachycardic.  I question if some of his nausea could be related to amiodarone.  [facility] will sign off.   Medication Recommendations: Agree with stopping amiodarone.  Would potentially consider low-dose beta-blocker for rate control-consider Toprol 25 mg) Other recommendations (labs, testing, etc): None Follow up as an outpatient: Not necessary in the setting of plan for hospice.  We will forward notes to his primary cardiologist.   Glenetta Hew, M.D., M.S. Interventional Cardiologist   Pager # [phone] Phone # [phone] [address] [place], [place] [address]

## 2019-12-05 DIAGNOSIS — R627 Adult failure to thrive: Secondary | ICD-10-CM

## 2019-12-05 MED ORDER — PROMETHAZINE HCL 12.5 MG RE SUPP: 12.5000 mg | RECTAL | Status: AC | PRN

## 2019-12-05 MED ORDER — MORPHINE SULFATE (CONCENTRATE) 10 MG/0.5ML PO SOLN: 5.0000 mg | ORAL | Status: AC | PRN

## 2019-12-05 MED ORDER — GLYCOPYRROLATE 0.2 MG/ML IJ SOLN: 0.4000 mg | Freq: Four times a day (QID) | INTRAMUSCULAR | Status: AC

## 2019-12-05 MED ORDER — GLYCOPYRROLATE 0.2 MG/ML IJ SOLN
0.4000 mg | Freq: Four times a day (QID) | INTRAMUSCULAR | Status: DC
  Filled 2019-12-05 (×2): qty 2

## 2019-12-05 MED ORDER — LORAZEPAM 1 MG PO TABS: 1.0000 mg | ORAL_TABLET | ORAL | Status: AC | PRN

## 2019-12-05 MED ORDER — PROMETHAZINE HCL 12.5 MG RE SUPP
12.5000 mg | RECTAL | Status: DC | PRN
  Filled 2019-12-05: qty 1

## 2019-12-05 MED ORDER — MORPHINE SULFATE (CONCENTRATE) 10 MG/0.5ML PO SOLN: 5.0000 mg | ORAL | Status: DC | PRN

## 2019-12-05 MED ORDER — LORAZEPAM 1 MG PO TABS: 1.0000 mg | ORAL_TABLET | ORAL | Status: DC | PRN

## 2019-12-05 NOTE — Progress Notes (Signed)
Discharge education and medication education given to patient and daughter. All questions and concerns answered. Peripheral IVs removed. All patient belongings and discharge information given to [facility] transporters. Patient transported to [facility] by [employer].

## 2019-12-05 NOTE — Progress Notes (Signed)
Report called and given to [facility] at [facility].

## 2019-12-05 NOTE — Discharge Summary (Addendum)
Physician Discharge Summary  Kenneth [name] [id number] DOB: 01-Jan-1928  PCP: Lavone Orn, MD  Admitted from: [facility] Discharged to: [facility] date: 11/28/2019 Discharge date: 12/05/2019  Recommendations for Outpatient Follow-up:   Follow-up Information    MD at [facility]. Schedule an appointment as soon as possible for a visit.   Why: Follow-up regarding EOL comfort care.       Lavone Orn, MD Follow up.   Specialty: Internal Medicine Why: FYI Contact information: [address], [address] [place] [address] [phone]        Belva Crome, MD .   Specialty: Cardiology Contact information: [phone] [place] [address] [phone]            Home Health: N/A Equipment/Devices: None  Discharge Condition: Guarded with poor prognosis, expected decline. CODE STATUS: DNR Diet recommendation: Comfort feeds of choice.  Discharge Diagnoses:  Principal Problem:   Acute pulmonary embolism (HCC) Active Problems:   HTN (hypertension)   Coronary artery disease involving coronary bypass graft of native heart without angina pectoris   Acute kidney injury (HCC)   Prolonged QT interval   Chronic systolic CHF (congestive heart failure) (HCC)   History of CVA (cerebrovascular accident)   Normocytic anemia   DNR (do not resuscitate)   Generalized weakness   Dehydration   Acute kidney injury superimposed on chronic kidney disease ([place])   Acute colitis   Community acquired pneumonia   Protein-calorie malnutrition, severe   Poor fluid intake   Brief Summary: 85 year old male with PMH of hypertension, hyperlipidemia, chronic systolic CHF, CAD s/p PCI with DES 11/12/2019, 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  Over the course of the hospitalization, he continued to decline with lethargy, poor oral intake and refusing feeds or oral medications.  Palliative care medicine was consulted on 12/04/2019 and after discussing with family, he was transitioned to full comfort care.  Palliative team has seen him today, reportedly had a much better evening without further nausea/vomiting and was able to rest comfortably.  Patient will be discharged to [facility] today for end-of-life comfort care.  ED course: He was noted to be afebrile, heart rate 71-127, respiratory rate 15-43, blood pressures 98/76-124/89 and not hypoxic.  Labs significant for WBC 10.7, hemoglobin 12, sodium 147, BUN 32, creatinine 1.28, glucose 170, troponin 235, BNP 1428.2, COVID-19 screening negative on 12/18.  Initial EKG was concerning for ST elevations and cardiology was consulted.  Assessment and plan:  1. Generalized weakness, dehydration, failure to thrive: It appears that patient has had a rough couple of weeks with frequent hospitalization and sustained decline.  He received IV fluids for dehydration.  Patient continues to refuse foods, drinks and medications even up to day of discharge. 2. Acute kidney injury complicating stage II chronic kidney disease: Presented with creatinine of 1.28.  Resolved. 3. CAD/elevated troponin: Recent hospitalization for STEMI requiring PCI with DES to SVG-PDA on 12/7.  Troponins 903 on 12/15, but repeat here to 35 > 230 which appeared to be trending down appropriately.  Although ST elevation was suspected initially on admission, cardiology did not think that was the case.  He complained of transient chest pain, repeat troponin was 197.  D-dimer was elevated.  CTA  positive for large burden acute pulmonary embolism as noted below.  Since he was hemodynamically stable, no indication for thrombectomy.  Initially started on IV heparin which was switched to  Lovenox along with Coumadin but patient has been refusing these. 4. Acute pulmonary embolism: Management as noted above. 5. Abdominal tenderness/large hiatal hernia/?  Possible acute colitis: He complained of abdominal pain, had some abdominal tenderness on exam and there was initial concern for possible ileus.  Abdominal x-ray did not show obstruction.  CT of abdomen and pelvis with contrast showed large hiatal hernia with the entire stomach herniated into the lower chest.  No diverticulitis but mild wall thickening of transverse colon.  He was started on empiric Zosyn for suspected colitis which was discontinued after he was transitioned to full comfort care. 6. Hypernatremia: Likely related to dehydration.  Mild and resolved. 7. Hypokalemia: Replaced. 8. Prolonged QTC: QTC was elevated at 538 ms. 9. Paroxysmal A. fib: Had been started on amiodarone by cardiology on 12/01/2019.  Now all medications nonessential to comfort have been discontinued. 10. Chronic systolic CHF: Clinically appears on the dry side.  TTE 12/7 showed LVEF of 20-25%.  Again all medications nonessential to comfort have been discontinued. 11. Essential hypertension: Controlled off of medications. 12. Recent nonhemorrhagic CVA: Recently hospitalized for distal basilar artery occlusion with thready flow to the right PCA requiring thrombectomy by IR. 13. Normocytic anemia: Hemoglobin stable 14. Anxiety disorder: Lexapro discontinued due to prolonged QT.  As needed Ativan for anxiety and agitation to continue at [facility] for comfort care. 15. Hypothyroidism: TSH normal on 11/27. 16. Hyperlipidemia: 17. Hypomagnesemia: Replaced.  Consultations:  Cardiology  Palliative care medicine  Procedures:  None   Discharge Instructions  Discharge Instructions    Call MD for:  difficulty breathing, headache or visual disturbances   Complete by: As directed    Call MD for:  extreme fatigue   Complete by: As directed    Call  MD for:  persistant dizziness or light-headedness   Complete by: As directed    Call MD for:  persistant nausea and vomiting   Complete by: As directed    Call MD for:  severe uncontrolled pain   Complete by: As directed    Call MD for:  temperature >100.4   Complete by: As directed    Diet general   Complete by: As directed    Comfort feeds of choice.   Increase activity slowly   Complete by: As directed        Medication List    STOP taking these medications   acetaminophen 325 MG tablet Commonly known as: TYLENOL   ALPRAZolam 0.5 MG tablet Commonly known as: XANAX   clopidogrel 75 MG tablet Commonly known as: PLAVIX   escitalopram 5 MG tablet Commonly known as: LEXAPRO   ferrous sulfate 325 (65 FE) MG tablet   isosorbide mononitrate 60 MG 24 hr tablet Commonly known as: IMDUR   latanoprost 0.005 % ophthalmic solution Commonly known as: XALATAN   levofloxacin 500 MG tablet Commonly known as: LEVAQUIN   levothyroxine 50 MCG tablet Commonly known as: SYNTHROID   losartan 25 MG tablet Commonly known as: COZAAR   metoprolol succinate 25 MG 24 hr tablet Commonly known as: TOPROL-XL   multivitamin with minerals Tabs tablet   nitroGLYCERIN 0.4 MG SL tablet Commonly known as: Nitrostat   ondansetron 4 MG tablet Commonly known as: ZOFRAN   pantoprazole 40 MG tablet Commonly known as: Protonix   potassium chloride  10 MEQ tablet Commonly known as: KLOR-CON   rosuvastatin 20 MG tablet Commonly known as: CRESTOR   torsemide 20 MG tablet Commonly known as: DEMADEX   vitamin B-12 500 MCG tablet Commonly known as: CYANOCOBALAMIN     TAKE these medications   glycopyrrolate 0.2 MG/ML injection Commonly known as: ROBINUL Inject 2 mLs (0.4 mg total) into the skin every 6 (six) hours.   LORazepam 1 MG tablet Commonly known as: ATIVAN Place 1 tablet (1 mg total) under the tongue every 4 (four) hours as needed for anxiety or sleep.   morphine CONCENTRATE  10 MG/0.5ML Soln concentrated solution Place 0.25-0.5 mLs (5-10 mg total) under the tongue every 4 (four) hours as needed for moderate pain, severe pain or shortness of breath.   promethazine 12.5 MG suppository Commonly known as: PHENERGAN Place 1 suppository (12.5 mg total) rectally every 4 (four) hours as needed for nausea or vomiting.   SOOTHE XP OP Place 1 drop into both eyes daily as needed (dry eyes).      Allergies  Allergen Reactions  . Lasix [Furosemide] Other (See Comments)    aches  . Spironolactone Other (See Comments)    Red, puffy, eyes, almost hospitalized  . Aspirin Other (See Comments)    Bleeding.    . Lipitor [Atorvastatin] Other (See Comments)    tired  . Lopressor [Metoprolol Tartrate] Other (See Comments)    Sleepy all the time 11.27.2020 Patient is currently taking Lopressor.  [name] Penicillins Hives and Rash    Has patient had a PCN reaction causing immediate rash, facial/tongue/throat swelling, SOB or lightheadedness with hypotension: No Has patient had a PCN reaction causing severe rash involving mucus membranes or skin necrosis: No Has patient had a PCN reaction that required hospitalization No Has patient had a PCN reaction occurring within the last 10 years: Yes If all of the above answers are "NO", then may proceed with Cephalosporin use.       Procedures/Studies:  DG Abd 1 View  Result Date: 11/29/2019 CLINICAL DATA:  Nausea, diarrhea, left upper quadrant pain EXAM: ABDOMEN - 1 VIEW COMPARISON:  CT 11/13/2019 FINDINGS: Nonobstructive bowel gas pattern. No organomegaly or free air. Right lower pole nephrolithiasis. Aortic and vascular calcifications noted. IMPRESSION: No obstruction or free air. Right lower pole nephrolithiasis. Electronically Signed   By: Rolm Baptise M.D.   On: 11/29/2019 10:45   CT ANGIO CHEST PE W OR WO CONTRAST  Result Date: 11/29/2019 CLINICAL DATA:  Lethargy, weakness and left upper quadrant abdominal pain. Elevated  troponin levels. Possible pulmonary embolism. EXAM: CT ANGIOGRAPHY CHEST CT ABDOMEN AND PELVIS WITH CONTRAST TECHNIQUE: Multidetector CT imaging of the chest was performed using the standard protocol during bolus administration of intravenous contrast. Multiplanar CT image reconstructions and MIPs were obtained to evaluate the vascular anatomy. Multidetector CT imaging of the abdomen and pelvis was performed using the standard protocol during bolus administration of intravenous contrast. CONTRAST:  144mL OMNIPAQUE IOHEXOL 350 MG/ML SOLN COMPARISON:  Abdominal radiographs 11/29/2019. Chest radiographs 11/28/2019. Chest CTA 05/04/2010. Abdominal CTA 11/13/2019. FINDINGS: CTA CHEST FINDINGS Cardiovascular: The pulmonary arteries are well opacified with contrast to the level of the subsegmental branches. There is a large occlusive thrombus within the left pulmonary artery, measuring up to 4.5 cm on image 55/1. This occludes the left lower lobe pulmonary arterial branches. The left upper lobe branches are patent. On the right, there is a small nonocclusive thrombus within the right middle lobe segmental branches (image 78/1). There is diffuse  atherosclerosis of the aorta, great vessels and coronary arteries post median sternotomy and CABG. There is limited opacification of the systemic arteries. The heart is mildly enlarged. The right ventricle to left ventricle ratio is 0.84 (Within normal limits). No significant pericardial effusion. Mediastinum/Nodes: There are no enlarged mediastinal, hilar or axillary lymph nodes.A very large hiatal hernia is again noted with the entire stomach herniated into the lower chest. The trachea and thyroid gland appear unremarkable. Lungs/Pleura: There is no significant pleural effusion or pneumothorax. There is chronic left lower lobe atelectasis adjacent to the hiatal hernia with increased soft tissue thickening around the left lower lobe bronchi. There is no well-defined mass. Scattered  calcified granulomas are present. Musculoskeletal/Chest wall: There is no chest wall mass or suspicious osseous finding. Previous median sternotomy. Review of the MIP images confirms the above findings. CT ABDOMEN and PELVIS FINDINGS Hepatobiliary: There are stable cysts within the right hepatic lobe. No suspicious hepatic findings. No evidence of gallstones, gallbladder wall thickening or biliary dilatation. Pancreas: Stable diffuse pancreatic atrophy with scattered parenchymal calcifications consistent with chronic pancreatitis. There is a 10 mm cystic lesion in the pancreatic body on image 22/5 which appears unchanged. No pancreatic ductal dilatation or surrounding inflammation. Spleen: Normal in size without focal abnormality. Adrenals/Urinary Tract: The adrenal glands appear stable without suspicious findings. Nonobstructing 6 mm calculus in the lower pole of the right kidney on image 36/5. No other urinary tract calculi. There is no evidence of renal mass or hydronephrosis. The bladder appears normal. Stomach/Bowel: As above, there is a large hiatal hernia. No significant abnormalities of the small bowel or appendix are seen. There are diverticular changes throughout the colon. There is mild wall thickening of the transverse colon which appears similar to the previous study. No surrounding inflammatory changes. Vascular/Lymphatic: There are no enlarged abdominal or pelvic lymph nodes. Diffuse aortic and branch vessel atherosclerosis. Possible nonocclusive thrombus in the right femoral vein. Reproductive: The prostate gland and seminal vesicles appear normal. Other: Postsurgical changes within the anterior abdominal wall. No residual hernia identified. There is no ascites or free air. Musculoskeletal: No acute or significant osseous findings. Chronic deformity of the left femoral neck appears unchanged. There is multilevel lumbar spondylosis. Review of the MIP images confirms the above findings. IMPRESSION: 1.  Study is positive for acute pulmonary embolism with a large occlusive thrombus within the left main pulmonary artery occluding the left lower lobe pulmonary arterial branches. Small nonocclusive thrombus in the right middle lobe segmental branches. No evidence of right heart strain. 2. Increased left lower lobe perihilar opacity may reflect atelectasis or infarct. 3. No acute findings within the abdomen or pelvis. 4. Possible nonocclusive thrombus in the right femoral vein. 5. Large hiatal hernia with the entire stomach herniated into the lower chest. Colonic diverticulosis and mild wall thickening of the transverse colon, similar to recent prior CT. 6. Sequela of chronic pancreatitis with stable small cystic lesion in the pancreatic body. 7. Aortic Atherosclerosis (ICD10-I70.0). 8. Critical Value/emergent results were called by telephone at the time of interpretation on 11/29/2019 at 5:47 pm to [place] , who verbally acknowledged these results. Electronically Signed   By: Richardean Sale M.D.   On: 11/29/2019 17:56   CT ABDOMEN PELVIS W CONTRAST  Result Date: 11/29/2019 CLINICAL DATA:  Lethargy, weakness and left upper quadrant abdominal pain. Elevated troponin levels. Possible pulmonary embolism. EXAM: CT ANGIOGRAPHY CHEST CT ABDOMEN AND PELVIS WITH CONTRAST TECHNIQUE: Multidetector CT imaging of the chest was performed using the  standard protocol during bolus administration of intravenous contrast. Multiplanar CT image reconstructions and MIPs were obtained to evaluate the vascular anatomy. Multidetector CT imaging of the abdomen and pelvis was performed using the standard protocol during bolus administration of intravenous contrast. CONTRAST:  19mL OMNIPAQUE IOHEXOL 350 MG/ML SOLN COMPARISON:  Abdominal radiographs 11/29/2019. Chest radiographs 11/28/2019. Chest CTA 05/04/2010. Abdominal CTA 11/13/2019. FINDINGS: CTA CHEST FINDINGS Cardiovascular: The pulmonary arteries are well opacified with  contrast to the level of the subsegmental branches. There is a large occlusive thrombus within the left pulmonary artery, measuring up to 4.5 cm on image 55/1. This occludes the left lower lobe pulmonary arterial branches. The left upper lobe branches are patent. On the right, there is a small nonocclusive thrombus within the right middle lobe segmental branches (image 78/1). There is diffuse atherosclerosis of the aorta, great vessels and coronary arteries post median sternotomy and CABG. There is limited opacification of the systemic arteries. The heart is mildly enlarged. The right ventricle to left ventricle ratio is 0.84 (Within normal limits). No significant pericardial effusion. Mediastinum/Nodes: There are no enlarged mediastinal, hilar or axillary lymph nodes.A very large hiatal hernia is again noted with the entire stomach herniated into the lower chest. The trachea and thyroid gland appear unremarkable. Lungs/Pleura: There is no significant pleural effusion or pneumothorax. There is chronic left lower lobe atelectasis adjacent to the hiatal hernia with increased soft tissue thickening around the left lower lobe bronchi. There is no well-defined mass. Scattered calcified granulomas are present. Musculoskeletal/Chest wall: There is no chest wall mass or suspicious osseous finding. Previous median sternotomy. Review of the MIP images confirms the above findings. CT ABDOMEN and PELVIS FINDINGS Hepatobiliary: There are stable cysts within the right hepatic lobe. No suspicious hepatic findings. No evidence of gallstones, gallbladder wall thickening or biliary dilatation. Pancreas: Stable diffuse pancreatic atrophy with scattered parenchymal calcifications consistent with chronic pancreatitis. There is a 10 mm cystic lesion in the pancreatic body on image 22/5 which appears unchanged. No pancreatic ductal dilatation or surrounding inflammation. Spleen: Normal in size without focal abnormality. Adrenals/Urinary  Tract: The adrenal glands appear stable without suspicious findings. Nonobstructing 6 mm calculus in the lower pole of the right kidney on image 36/5. No other urinary tract calculi. There is no evidence of renal mass or hydronephrosis. The bladder appears normal. Stomach/Bowel: As above, there is a large hiatal hernia. No significant abnormalities of the small bowel or appendix are seen. There are diverticular changes throughout the colon. There is mild wall thickening of the transverse colon which appears similar to the previous study. No surrounding inflammatory changes. Vascular/Lymphatic: There are no enlarged abdominal or pelvic lymph nodes. Diffuse aortic and branch vessel atherosclerosis. Possible nonocclusive thrombus in the right femoral vein. Reproductive: The prostate gland and seminal vesicles appear normal. Other: Postsurgical changes within the anterior abdominal wall. No residual hernia identified. There is no ascites or free air. Musculoskeletal: No acute or significant osseous findings. Chronic deformity of the left femoral neck appears unchanged. There is multilevel lumbar spondylosis. Review of the MIP images confirms the above findings. IMPRESSION: 1. Study is positive for acute pulmonary embolism with a large occlusive thrombus within the left main pulmonary artery occluding the left lower lobe pulmonary arterial branches. Small nonocclusive thrombus in the right middle lobe segmental branches. No evidence of right heart strain. 2. Increased left lower lobe perihilar opacity may reflect atelectasis or infarct. 3. No acute findings within the abdomen or pelvis. 4. Possible nonocclusive thrombus in the  right femoral vein. 5. Large hiatal hernia with the entire stomach herniated into the lower chest. Colonic diverticulosis and mild wall thickening of the transverse colon, similar to recent prior CT. 6. Sequela of chronic pancreatitis with stable small cystic lesion in the pancreatic body. 7.  Aortic Atherosclerosis (ICD10-I70.0). 8. Critical Value/emergent results were called by telephone at the time of interpretation on 11/29/2019 at 5:47 pm to [place] , who verbally acknowledged these results. Electronically Signed   By: Richardean Sale M.D.   On: 11/29/2019 17:56   DG Chest Port 1 View  Result Date: 11/28/2019 CLINICAL DATA:  Weakness, nausea and lethargy since last night. EXAM: PORTABLE CHEST 1 VIEW COMPARISON:  Single-view of the chest 11/20/2019 and 09/10/2017. FINDINGS: Scattered punctate calcified granulomata are seen bilaterally, unchanged. Lungs otherwise clear. No pneumothorax or pleural effusion. Heart size is upper normal. Atherosclerosis is seen. Large hiatal hernia is identified. No acute or focal bony abnormality. IMPRESSION: No acute disease. Hiatal hernia. Electronically Signed   By: Inge Rise M.D.   On: 11/28/2019 12:52   VAS [place] LOWER EXTREMITY VENOUS (DVT)  Result Date: 11/30/2019  Lower Venous Study Indications: Pulmonary embolism.  Comparison Study: no prior Performing Technologist: June Leap RDMS, RVT  Examination Guidelines: A complete evaluation includes B-mode imaging, spectral Doppler, color Doppler, and power Doppler as needed of all accessible portions of each vessel. Bilateral testing is considered an integral part of a complete examination. Limited examinations for reoccurring indications may be performed as noted.  +---------+---------------+---------+-----------+----------+--------------+ RIGHT    CompressibilityPhasicitySpontaneityPropertiesThrombus Aging +---------+---------------+---------+-----------+----------+--------------+ CFV      Full           Yes      Yes                                 +---------+---------------+---------+-----------+----------+--------------+ SFJ      Full                                                        +---------+---------------+---------+-----------+----------+--------------+ FV  Prox  Full                                                        +---------+---------------+---------+-----------+----------+--------------+ FV Mid   Full                                                        +---------+---------------+---------+-----------+----------+--------------+ FV DistalFull                                                        +---------+---------------+---------+-----------+----------+--------------+ PFV      Full                                                        +---------+---------------+---------+-----------+----------+--------------+  POP      Full           Yes      Yes                                 +---------+---------------+---------+-----------+----------+--------------+ PTV      Full                                                        +---------+---------------+---------+-----------+----------+--------------+ PERO     Full                                                        +---------+---------------+---------+-----------+----------+--------------+   +---------+---------------+---------+-----------+----------+--------------+ LEFT     CompressibilityPhasicitySpontaneityPropertiesThrombus Aging +---------+---------------+---------+-----------+----------+--------------+ CFV      Full           Yes      Yes                                 +---------+---------------+---------+-----------+----------+--------------+ SFJ      Full                                                        +---------+---------------+---------+-----------+----------+--------------+ FV Prox  Full                                                        +---------+---------------+---------+-----------+----------+--------------+ FV Mid   Full                                                        +---------+---------------+---------+-----------+----------+--------------+ FV DistalFull                                                         +---------+---------------+---------+-----------+----------+--------------+ PFV      Full                                                        +---------+---------------+---------+-----------+----------+--------------+ POP      Full           Yes      Yes                                 +---------+---------------+---------+-----------+----------+--------------+  PTV      Full                                                        +---------+---------------+---------+-----------+----------+--------------+ PERO     Full                                                        +---------+---------------+---------+-----------+----------+--------------+     Summary: Right: There is no evidence of deep vein thrombosis in the lower extremity. No cystic structure found in the popliteal fossa. Left: There is no evidence of deep vein thrombosis in the lower extremity. No cystic structure found in the popliteal fossa.  *See table(s) above for measurements and observations. Electronically signed by Monica Martinez MD on 11/30/2019 at 1:43:32 PM.    Final    Subjective: Poor historian.  Does not say much.  Sleepy but easily arousable.  Responds to his name.  "Thirsty" but when nurse offered drinks and food, patient consistently refused.  As per RN, no other acute issues reported.  Discharge Exam:  Vitals:   12/04/19 0954 12/04/19 1221 12/04/19 2015 12/05/19 0525  BP: 112/81 120/88 102/73   Pulse: (!) 125 (!) 130 (!) 137   Resp:  18 20 14   Temp:  (!) 97.4 F (36.3 C) 98.4 F (36.9 C)   TempSrc:  Oral Oral   SpO2: 100% 100% 98%   Weight:      Height:        General: Elderly male, moderately built and frail, chronically ill looking lying comfortably propped up in bed without distress.  Lips and oral mucosa dry. Cardiovascular: S1 & S2 heard, irregularly irregular and mildly tachycardic, S1/S2 +. No murmurs, rubs, gallops or clicks. No JVD or pedal edema.   Not on telemetry Respiratory: Poor inspiratory effort.  Bilateral diminished breath sounds but no obvious wheezing, rhonchi or crackles.  No increased work of breathing.   Abdominal:  Non distended, non tender & soft. No organomegaly or masses appreciated. Normal bowel sounds heard. CNS: Sleepy/drowsy but easily arousable, may be oriented to self only.  Does not follow instructions.  No obvious focal neurological deficits. Extremities: no edema, no cyanosis    The results of significant diagnostics from this hospitalization (including imaging, microbiology, ancillary and laboratory) are listed below for reference.     Microbiology: Recent Results (from the past 240 hour(s))  SARS CORONAVIRUS 2 (TAT 6-24 HRS) Nasopharyngeal Nasopharyngeal Swab     Status: None   Collection Time: 11/28/19  6:30 PM   Specimen: Nasopharyngeal Swab  Result Value Ref Range Status   SARS Coronavirus 2 NEGATIVE NEGATIVE Final    Comment: (NOTE) SARS-CoV-2 target nucleic acids are NOT DETECTED. The SARS-CoV-2 RNA is generally detectable in upper and lower respiratory specimens during the acute phase of infection. Negative results do not preclude SARS-CoV-2 infection, do not rule out co-infections with other pathogens, and should not be used as the sole basis for treatment or other patient management decisions. Negative results must be combined with clinical observations, patient history, and epidemiological information. The expected result is Negative. Fact Sheet for Patients: SugarRoll.be Fact Sheet for Healthcare Providers: https://www.woods-mathews.com/  This test is not yet approved or cleared by the [place] and  has been authorized for detection and/or diagnosis of SARS-CoV-2 by FDA under an Emergency Use Authorization (EUA). This EUA will remain  in effect (meaning this test can be used) for the duration of the COVID-19 declaration under Section 56  4(b)(1) of the Act, 21 U.S.C. section 360bbb-3(b)(1), unless the authorization is terminated or revoked sooner. Performed at [facility], [place] [address], [place], [place] [address]      Labs: CBC: Recent Labs  Lab 11/28/19 1320 11/29/19 0930 11/30/19 0215 12/01/19 0738 12/02/19 0437 12/03/19 0321 12/04/19 0545  WBC 10.7* 12.3* 9.7 9.7 14.0* 11.4* 11.8*  NEUTROABS 8.7* 9.9*  --   --   --   --   --   HGB 12.0* 11.8* 9.9* 11.3* 11.5* 10.8* 11.5*  HCT 39.2 39.0 31.3* 34.9* 36.3* 32.7* 35.9*  MCV 99.5 98.5 95.7 92.6 93.3 91.6 93.2  PLT 370 370 289 281 305 276 [id number]    Basic Metabolic Panel: Recent Labs  Lab 11/29/19 0439 11/29/19 1316 11/30/19 1033 12/01/19 0738 12/02/19 0818 12/03/19 0321 12/04/19 0545  NA  --  142 138 140 135 133*  --   K  --  3.2* 4.1 3.0* 3.0* 3.5  --   CL  --  104 108 103 97* 101  --   CO2  --  23 20* 26 25 24   --   GLUCOSE  --  158* 165* 157* 175* 174*  --   BUN  --  23 18 20 22  24*  --   CREATININE  --  0.97 0.77 0.92 1.02 0.95  --   CALCIUM  --  8.9 8.7* 8.1* 8.2* 8.0*  --   MG 1.8  --   --   --   --  1.3* 2.4    Liver Function Tests: Recent Labs  Lab 11/28/19 1320  AST 16  ALT 14  ALKPHOS 82  BILITOT 1.2  PROT 6.5  ALBUMIN 2.9*    CBG: Recent Labs  Lab 11/28/19 2216 11/29/19 0622 12/02/19 0842  GLUCAP 138* 159* 168*    Urinalysis    Component Value Date/Time   COLORURINE YELLOW 11/29/2019 0132   APPEARANCEUR HAZY (A) 11/29/2019 0132   LABSPEC 1.026 11/29/2019 0132   PHURINE 5.0 11/29/2019 0132   GLUCOSEU NEGATIVE 11/29/2019 0132   HGBUR NEGATIVE 11/29/2019 0132   BILIRUBINUR NEGATIVE 11/29/2019 0132   KETONESUR 80 (A) 11/29/2019 0132   PROTEINUR 30 (A) 11/29/2019 0132   UROBILINOGEN 1.0 09/09/2015 1301   NITRITE NEGATIVE 11/29/2019 0132   LEUKOCYTESUR NEGATIVE 11/29/2019 0132    I called and discussed with patient's daughter via phone, updated care and plans for discharge to [facility] today.  She  verbalized understanding and agreement.  Time coordinating discharge: 45 minutes  SIGNED:  Vernell Leep, MD, [place], [facility]. [facility]  To contact the attending provider between 7A-7P or the covering provider during after hours 7P-7A, please log into the web site [email or web address] and access using universal [place] password for that web site. If you do not have the password, please call the hospital operator.

## 2019-12-05 NOTE — Progress Notes (Signed)
Nutrition Brief Note RD working remotely. Chart reviewed. Pt now transitioning to comfort care.  No further nutrition interventions warranted at this time.  Please re-consult as needed.   Laddie Math A. Cheynne Virden, RD, LDN, CDCES Registered Dietitian II Certified Diabetes Care and Education Specialist Pager: [phone] After hours Pager: [phone]  

## 2019-12-05 NOTE — Progress Notes (Signed)
Nurse offered patient water, juice, and breakfast. Patient refused breakfast food but requested water; unable to suck through straw. Nurse provided water via mouth swabs, patient sucked on swabs lightly. Oral hygiene was provided including oral mucosa moisturizer and lips moisturizer. Mouth was swabbed with mouthwash. Patient denies pain or nausea. Currently, patient lying in bed, call bell within reach, bed lowest position.

## 2019-12-05 NOTE — TOC Transition Note (Signed)
Transition of Care [facility]) - CM/SW Discharge Note   Patient Details  Name: Kenneth [name] MRN: [id number] Date of Birth: [birth year]  Transition of Care [facility]) CM/SW Contact:  Alberteen Sam, LCSW Phone Number: 12/05/2019, 12:57 PM   Clinical Narrative:     Patient will DC [id number] Place Anticipated DC date: 12/05/2019 Family notified:Krista Transport [id number]  Per MD patient ready for DC to [facility] . RN, patient, patient's family, and facility notified of DC. Discharge Summary sent to facility at [phone]. RN given number for report  [phone]   . DC packet on chart. Ambulance transport requested for patient.  CSW signing off.  [place], [place]   Final next level of care: [place] Barriers to Discharge: No Barriers Identified   Patient Goals and CMS Choice Patient states their goals for this hospitalization and ongoing recovery are:: to return to [place] for rehab CMS Medicare.gov Compare Post Acute Care list provided to:: Patient Represenative (must comment)(Krista (daughter)) Choice offered to / list presented to : Adult Children  Discharge Placement              Patient chooses bed at: Other - please specify in the comment section below:([facility]) Patient to be transferred to facility by: [place] Name of family member notified: [place] Patient and family notified of of transfer: 12/05/19  Discharge Plan and Services   Discharge Planning Services: CM Consult Post Acute Care Choice: [place]          DME Arranged: N/A         HH Arranged: NA          Social Determinants of Health (SDOH) Interventions     Readmission Risk Interventions No flowsheet data found.

## 2019-12-05 NOTE — Discharge Instructions (Signed)
Hospice Hospice is a service that is designed to provide people who are terminally ill and their families with medical, spiritual, and psychological support. Its aim is to improve your quality of life by keeping you as comfortable as possible in the final stages of life. Who will be my providers when I begin hospice care? Hospice teams often include:  A nurse.  A doctor. The hospice doctor will be available for your care, but you can include your regular doctor or nurse practitioner.  A social worker.  A counselor.  A religious leader (such as a chaplain).  A dietitian.  Therapists.  Trained volunteers who can help with care. What services does hospice provide? Hospice services can vary depending on the center or organization. Generally, they include:  Ways to keep you comfortable, such as: ? Providing care in your home or in a home-like setting. ? Working with your family and friends to help meet your needs. ? Allowing you to enjoy the support of loved ones by receiving much of your basic care from family and friends.  Pain relief and symptom management. The staff will supply all necessary medicines and equipment so that you can stay comfortable and alert enough to enjoy the company of your friends and family.  Visits or care from a nurse and doctor. This may include 24-hour on-call services.  Companionship when you are alone.  Allowing you and your family to rest. Hospice staff may do light housekeeping, prepare meals, and run errands.  Counseling. They will make sure your emotional, spiritual, and social needs are being met, as well as those needs of your family members.  Spiritual care. This will be individualized to meet your needs and your family's needs. It may involve: ? Helping you and your family understand the dying process. ? Helping you say goodbye to your family and friends. ? Performing a specific religious ceremony or ritual.  Massage.  Nutrition  therapy.  Physical and occupational therapy.  Short-term inpatient care, if something cannot be managed in the home.  Art or music therapy.  Bereavement support for grieving family members. When should hospice care begin? Most people who use hospice are believed to have less than 6 months to live.  Your family and health care providers can help you decide when hospice services should begin.  If you live longer than 6 months but your condition does not improve, your doctor may be able to approve you for continued hospice care.  If your condition improves, you may discontinue the program. What should I consider before selecting a program? Most hospice programs are run by nonprofit, independent organizations. Some are affiliated with hospitals, nursing homes, or home health care agencies. Hospice programs can take place in your home or at a hospice center, hospital, or skilled nursing facility. When choosing a hospice program, ask the following questions:  What services are available to me?  What services will be offered to my loved ones?  How involved will my loved ones be?  How involved will my health care provider be?  Who makes up the hospice care team? How are they trained or screened?  How will my pain and symptoms be managed?  If my circumstances change, can the services be provided in a different setting, such as my home or in the hospital?  Is the program reviewed and licensed by the state or certified in some other way?  What does it cost? Is it covered by insurance?  If I choose a hospice   center or nursing home, where is the hospice center located? Is it convenient for family and friends?  If I choose a hospice center or nursing home, can my family and friends visit any time?  Will you provide emotional and spiritual support?  Who can my family call with questions? Where can I learn more about hospice? You can learn about existing hospice programs in your area  from your health care providers. You can also read more about hospice online. The websites of the following organizations have helpful information:  [facility] and Palliative Care Organization [facility]): http://www.brown-buchanan.com/  National Association for [facility]): http://massey-hart.com/  Hospice Foundation of America ([place]): www.hospicefoundation.org  American Cancer Society (ACS): www.cancer.org  Hospice Net: www.hospicenet.org  Visiting Nurse Associations of [place] (VNAA): www.vnaa.org You may also find more information by contacting the following agencies:  A local agency on aging.  Your local [employer] chapter.  Your state's department of health or social services. Summary  Hospice is a service that is designed to provide people who are terminally ill and their families with medical, spiritual, and psychological support.  Hospice aims to improve your quality of life by keeping you as comfortable as possible in the final stages of life.  Hospice teams often include a doctor, nurse, social worker, counselor, religious leader,dietitian, therapists, and volunteers.  Hospice care generally includes medicine for symptom management, visits from doctors and nurses, physical and occupational therapy, nutrition counseling, spiritual and emotional counseling, caregiver support, and bereavement support for grieving family members.  Hospice programs can take place in your home or at a hospice center, hospital, or skilled nursing facility. This information is not intended to replace advice given to you by your health care provider. Make sure you discuss any questions you have with your health care provider. Document Released: 03/10/2004 Document Revised: 11/04/2017 Document Reviewed: 12/14/2016 Elsevier Patient Education  2020 [employer].

## 2019-12-05 NOTE — Progress Notes (Signed)
Nurse received a call from patient's daughter, Daleen Snook. Daleen Snook states that patient's son, Jeven Minerd, is not to visit patient. Nurse called front desk of hospital to notify security and front desk.

## 2019-12-05 NOTE — Progress Notes (Signed)
[facility]  Received request from Vinie Sill, NP for family interest in [facility]. Chart reviewed and eligibility confirmed. Room offered for transfer today. Paper work has been completed. CSW Caryl Pina and [facility] aware.   Please send discharge summary to [phone].  RN please call report to [facility] prior to patient leaving the unit at [phone].  Thank you,  Erling Conte, LCSW [phone]

## 2019-12-05 NOTE — Progress Notes (Signed)
Palliative:  HPI: 85 y.o. 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 He has continued to decline over hospitalization with lethargy and poor intake and not taking medications. 12/29 Full comfort care. 12/30 He appears much more comfortable with no further nausea/vomiting.   I met today at Mr. Molstad bedside with RN. No family at bedside. He has reportedly had a much better evening with no further nausea/vomiting and has been able to rest comfortably. Discussed plan for comfort and transition to [facility] when bed available with RN and Dr. Exie Parody. Also notified hospice liaison to desire for transition to [facility] and hopeful for placement today - she will reach out to family.   Exam: Sleeping comfortably - I did not arouse.   Plan: - No changes to comfort medications.  - Transition to hospice facility for end of life and comfort care.  - RN to call with any changes in status or symptom needs.   15 min  Vinie Sill, NP Palliative Medicine Team Pager [phone] (Please see [email or web address] for schedule) Team Phone [phone]    Greater than 50%  of this time was spent counseling and coordinating care related to the above assessment and plan

## 2019-12-09 NOTE — Progress Notes (Deleted)
Cardiology Office Note   Date:  12/09/2019   ID:  Kenneth [name], DOB 1928/01/18, MRN [id number]  PCP:  Lavone Orn, MD  Cardiologist:  Dr. Tamala Julian     No chief complaint on file.     History of Present Illness: Kenneth [name] is a 86 y.o. male who presents for ***  He has a history of CAD with remote CABG per EBG, hypertension, hyperlipidemia, hypertension, andpriorhistory of GI bleeding.   Last seen by Dr. Tamala Julian back in February - noted a various assortment of aches and pains but overall felt to have a stable cardiac status. He continues to take care of his wife who has a cognitive impairment - he does all the cooking/cleaning/chores, etc. He likes a very simplified approach to his care and medicines.  Comes in today. Here alone. Lots of various ailments. He is convinced the COVID situation is "all a conspiracy". He does not feel like he can breath with a mask. More bothered by cramps. Has had some vertigo as well. No real chest pain that I can discern. He says "you know I'm old don't you". Labs from June with PCP noted. He notes lots of difficulty with taking care of his wife.   Hospitalized 11/27-11/29/20 with diverticulitis and large hiatal hernia Hospitalized 11/12/19 with severe fatigue nausea and SOB, + STEMI of inferior wall.  Emergent cath with PCI/DES X 1 VG to PDA and required extensive thrombectomy  D/c'd on Brilinta alone with increased risk of GI bleed.  EF was 20-25%.  Hospitalized 11/20/19 with CVA treated with extraction thrombectomy and improvement of symptoms. Had Brilinta and changed to Plavix.   Hospitalized 11/28/19 with weakness.  Cardiology saw for elevated troponin.  No chest pain and troponi more flat at 235 and 220.  BNP elevated 1428 but euvolemic on exam.  His EKG shows sinus tach with nonspecific ST and T wave changes.  His hemoglobin is mildly reduced Dx of PE and discharged with and PAF.  Started on po amiodarone. But with increased nausa and  thought RVR was sinus tach amiodarone stopped.   palliative care saw pt and on full hospice and made a DNR.  Full comfort care.  Past Medical History:  Diagnosis Date  . Anemia    on iron  . Anxiety   . Arthritis   . Basal cell carcinoma    left ear  . CAD (coronary artery disease)    a. CAD s/p CABG 1993.  [name] Chronic cough   . Chronic rhinitis   . Complication of anesthesia   . Diabetes (Many)   . Diverticulosis of colon   . GERD (gastroesophageal reflux disease)   . Glaucoma   . H/O hiatal hernia   . Hernia    left side  . History of gout   . History of kidney stones   . HTN (hypertension)   . Hyperlipidemia `  . Melanoma of skin, site unspecified   . PONV (postoperative nausea and vomiting)   . [facility] spotted fever 2-3 yrs ago  . Thyroid disease     Past Surgical History:  Procedure Laterality Date  . COLONOSCOPY WITH PROPOFOL N/A 11/27/2013   Procedure: COLONOSCOPY WITH PROPOFOL;  Surgeon: Garlan Fair, MD;  Location: [facility];  Service: Endoscopy;  Laterality: N/A;  . COLOSTOMY TAKEDOWN N/A 03/16/2016   Procedure: COLOSTOMY TAKEDOWN;  Surgeon: Excell Seltzer, MD;  Location: [facility];  Service: General;  Laterality: N/A;  . CORONARY ARTERY BYPASS GRAFT  1990's   x 3  . CORONARY/GRAFT ACUTE MI REVASCULARIZATION N/A 11/12/2019   Procedure: CORONARY/GRAFT ACUTE MI REVASCULARIZATION;  Surgeon: Troy Sine, MD;  Location: [facility];  Service: Cardiovascular;  Laterality: N/A;  . HERNIA REPAIR  1970's,17   lft ing hernia  . INGUINAL HERNIA REPAIR Left 06/28/2014   Procedure: Left  INGUINAL HERNIA REPAIR ;  Surgeon: Merrie Roof, MD;  Location: [facility];  Service: General;  Laterality: Left;  . INSERTION OF MESH Left 06/28/2014   Procedure: INSERTION OF MESH;  Surgeon: Merrie Roof, MD;  Location: [facility];  Service: General;  Laterality: Left;  . INSERTION OF MESH N/A 10/11/2014  . IR CT HEAD LTD  11/19/2019  . IR PERCUTANEOUS ART  THROMBECTOMY/INFUSION INTRACRANIAL INC DIAG ANGIO  11/19/2019  . LAPAROSCOPY  10/11/2014  . LAPAROTOMY N/A 09/11/2015   Procedure: EXPLORATORY LAPAROTOMY;  Surgeon: Excell Seltzer, MD;  Location: [facility];  Service: General;  Laterality: N/A;  . MASS EXCISION Left 03/21/2017   Procedure: EXCISION BASAL CELL CARCINOMA LEFT EAR;  Surgeon: Leta Baptist, MD;  Location: [facility];  Service: ENT;  Laterality: Left;  . PARTIAL COLECTOMY N/A 09/11/2015   Procedure: SIGMOID COLECTOMY AND COLOSTOMY;  Surgeon: Excell Seltzer, MD;  Location: [facility];  Service: General;  Laterality: N/A;  . RADIOLOGY WITH ANESTHESIA N/A 11/19/2019   Procedure: RADIOLOGY WITH ANESTHESIA;  Surgeon: Luanne Bras, MD;  Location: [facility];  Service: Radiology;  Laterality: N/A;  . right leg fracture age 22    . SKIN FULL THICKNESS GRAFT Left 03/21/2017   Procedure: FULL THICKNESS SKIN GRAFT TO LEFT EAR;  Surgeon: Leta Baptist, MD;  Location: [facility];  Service: ENT;  Laterality: Left;  . TONSILLECTOMY  age 65 or 17  . VENTRAL HERNIA REPAIR  10/11/2014   Procedure: HERNIA REPAIR VENTRAL ADULT;  Surgeon: Autumn Messing III, MD;  Location: [facility];  Service: General;;     Current Outpatient Medications  Medication Sig Dispense Refill  . Artificial Tear Solution (SOOTHE XP OP) Place 1 drop into both eyes daily as needed (dry eyes).     [name] glycopyrrolate (ROBINUL) 0.2 MG/ML injection Inject 2 mLs (0.4 mg total) into the skin every 6 (six) hours.    [name] LORazepam (ATIVAN) 1 MG tablet Place 1 tablet (1 mg total) under the tongue every 4 (four) hours as needed for anxiety or sleep.    . Morphine Sulfate (MORPHINE CONCENTRATE) 10 MG/0.5ML SOLN concentrated solution Place 0.25-0.5 mLs (5-10 mg total) under the tongue every 4 (four) hours as needed for moderate pain, severe pain or shortness of breath.    . promethazine (PHENERGAN) 12.5 MG suppository Place 1 suppository (12.5 mg total) rectally every 4 (four) hours as  needed for nausea or vomiting.     No current facility-administered medications for this visit.    Allergies:   Lasix [furosemide], Spironolactone, Aspirin, Lipitor [atorvastatin], Lopressor [metoprolol tartrate], and Penicillins    Social History:  The patient  reports that he has quit smoking. His smoking use included cigars. He has never used smokeless tobacco. He reports current alcohol use. He reports that he does not use drugs.   Family History:  The patient's ***family history includes CAD in his father; Cancer in his mother; Hyperlipidemia in his father.    ROS:  General:no colds or fevers, no weight changes Skin:no rashes or ulcers HEENT:no blurred vision, no congestion CV:see HPI PUL:see HPI GI:no diarrhea constipation  or melena, no indigestion GU:no hematuria, no dysuria MS:no joint pain, no claudication Neuro:no syncope, no lightheadedness Endo:no diabetes, no thyroid disease Wt Readings from Last 3 Encounters:  12/04/19 134 lb 7.7 oz (61 kg)  11/19/19 149 lb 14.6 oz (68 kg)  11/16/19 148 lb 1.9 oz (67.2 kg)     PHYSICAL EXAM: VS:  There were no vitals taken for this visit. , BMI There is no height or weight on file to calculate BMI. General:Pleasant affect, NAD Skin:Warm and dry, brisk capillary refill HEENT:normocephalic, sclera clear, mucus membranes moist Neck:supple, no JVD, no bruits  Heart:S1S2 RRR without murmur, gallup, rub or click Lungs:clear without rales, rhonchi, or wheezes [id number], non tender, + BS, do not palpate liver spleen or masses Ext:no lower ext edema, 2+ pedal pulses, 2+ radial pulses Neuro:alert and oriented, MAE, follows commands, + facial symmetry    EKG:  EKG is ordered today. The ekg ordered today demonstrates ***   Recent Labs: 11/02/2019: TSH 2.833 11/28/2019: ALT 14; B Natriuretic Peptide 1,428.2 12/03/2019: BUN 24; Creatinine, Ser 0.95; Potassium 3.5; Sodium 133 12/04/2019: Hemoglobin 11.5; Magnesium 2.4; Platelets 338      Lipid Panel    Component Value Date/Time   CHOL 81 11/20/2019 0500   TRIG 67 11/20/2019 0500   HDL 32 (L) 11/20/2019 0500   CHOLHDL 2.5 11/20/2019 0500   VLDL 13 11/20/2019 0500   LDLCALC 36 11/20/2019 0500       Other studies Reviewed: Additional studies/ records that were reviewed today include: ***.   ASSESSMENT AND PLAN:  1.  ***   Current medicines are reviewed with the patient today.  The patient Has no concerns regarding medicines.  The following changes have been made:  See above Labs/ tests ordered today include:see above  Disposition:   FU:  see above  Signed, Cecilie Kicks, NP  12/09/2019 10:11 PM    [address], [place], [place], [place] Phone: [phone]; Fax: [phone]

## 2019-12-10 ENCOUNTER — Ambulatory Visit: Payer: Medicare Other | Admitting: Cardiology

## 2020-01-01 ENCOUNTER — Inpatient Hospital Stay: Payer: Medicare Other | Admitting: Adult Health

## 2020-01-07 DEATH — deceased

## 2020-01-25 DIAGNOSIS — I509 Heart failure, unspecified: Secondary | ICD-10-CM
